# Patient Record
Sex: Male | Born: 1970
Health system: Southern US, Community
[De-identification: ages and names within clinical notes are randomized; demographics above are authoritative.]

## PROBLEM LIST (undated history)

## (undated) DIAGNOSIS — F329 Major depressive disorder, single episode, unspecified: Secondary | ICD-10-CM

## (undated) DIAGNOSIS — F32A Depression, unspecified: Secondary | ICD-10-CM

## (undated) DIAGNOSIS — S24109A Unspecified injury at unspecified level of thoracic spinal cord, initial encounter: Secondary | ICD-10-CM

## (undated) HISTORY — DX: Unspecified injury at unspecified level of thoracic spinal cord, initial encounter: S24.109A

---

## 2012-04-24 HISTORY — PX: PROGRAMABLE BACLOFEN PUMP REVISION: SHX2268

## 2014-04-27 DIAGNOSIS — T85618A Breakdown (mechanical) of other specified internal prosthetic devices, implants and grafts, initial encounter: Secondary | ICD-10-CM | POA: Diagnosis not present

## 2014-04-27 DIAGNOSIS — R252 Cramp and spasm: Secondary | ICD-10-CM | POA: Diagnosis not present

## 2014-04-27 DIAGNOSIS — E782 Mixed hyperlipidemia: Secondary | ICD-10-CM | POA: Diagnosis not present

## 2014-04-27 DIAGNOSIS — E785 Hyperlipidemia, unspecified: Secondary | ICD-10-CM | POA: Diagnosis not present

## 2014-04-27 DIAGNOSIS — T8589XA Other specified complication of internal prosthetic devices, implants and grafts, not elsewhere classified, initial encounter: Secondary | ICD-10-CM | POA: Diagnosis not present

## 2014-04-27 DIAGNOSIS — S24109D Unspecified injury at unspecified level of thoracic spinal cord, subsequent encounter: Secondary | ICD-10-CM | POA: Diagnosis not present

## 2014-04-27 DIAGNOSIS — S24103D Unspecified injury at T7-T10 level of thoracic spinal cord, subsequent encounter: Secondary | ICD-10-CM | POA: Diagnosis not present

## 2014-04-28 DIAGNOSIS — R252 Cramp and spasm: Secondary | ICD-10-CM | POA: Diagnosis not present

## 2014-05-04 DIAGNOSIS — G971 Other reaction to spinal and lumbar puncture: Secondary | ICD-10-CM | POA: Diagnosis not present

## 2014-05-04 DIAGNOSIS — R252 Cramp and spasm: Secondary | ICD-10-CM | POA: Diagnosis not present

## 2014-06-17 DIAGNOSIS — G8220 Paraplegia, unspecified: Secondary | ICD-10-CM | POA: Diagnosis not present

## 2014-06-17 DIAGNOSIS — M419 Scoliosis, unspecified: Secondary | ICD-10-CM | POA: Diagnosis not present

## 2014-06-17 DIAGNOSIS — Q899 Congenital malformation, unspecified: Secondary | ICD-10-CM | POA: Diagnosis not present

## 2014-07-08 DIAGNOSIS — S91101A Unspecified open wound of right great toe without damage to nail, initial encounter: Secondary | ICD-10-CM | POA: Diagnosis not present

## 2014-07-08 DIAGNOSIS — M79674 Pain in right toe(s): Secondary | ICD-10-CM | POA: Diagnosis not present

## 2015-01-12 DIAGNOSIS — G8220 Paraplegia, unspecified: Secondary | ICD-10-CM | POA: Diagnosis not present

## 2015-01-12 DIAGNOSIS — R52 Pain, unspecified: Secondary | ICD-10-CM | POA: Diagnosis not present

## 2015-01-12 DIAGNOSIS — M419 Scoliosis, unspecified: Secondary | ICD-10-CM | POA: Diagnosis not present

## 2015-01-12 DIAGNOSIS — G5601 Carpal tunnel syndrome, right upper limb: Secondary | ICD-10-CM | POA: Diagnosis not present

## 2015-01-12 DIAGNOSIS — L89159 Pressure ulcer of sacral region, unspecified stage: Secondary | ICD-10-CM | POA: Diagnosis not present

## 2015-01-12 DIAGNOSIS — K592 Neurogenic bowel, not elsewhere classified: Secondary | ICD-10-CM | POA: Diagnosis not present

## 2015-01-12 DIAGNOSIS — N319 Neuromuscular dysfunction of bladder, unspecified: Secondary | ICD-10-CM | POA: Diagnosis not present

## 2015-01-12 DIAGNOSIS — R252 Cramp and spasm: Secondary | ICD-10-CM | POA: Diagnosis not present

## 2015-01-12 DIAGNOSIS — S24109S Unspecified injury at unspecified level of thoracic spinal cord, sequela: Secondary | ICD-10-CM | POA: Diagnosis not present

## 2015-01-12 DIAGNOSIS — K3184 Gastroparesis: Secondary | ICD-10-CM | POA: Diagnosis not present

## 2015-01-13 DIAGNOSIS — Z0189 Encounter for other specified special examinations: Secondary | ICD-10-CM | POA: Diagnosis not present

## 2015-01-13 DIAGNOSIS — N390 Urinary tract infection, site not specified: Secondary | ICD-10-CM | POA: Diagnosis not present

## 2015-01-13 DIAGNOSIS — N200 Calculus of kidney: Secondary | ICD-10-CM | POA: Diagnosis not present

## 2015-01-13 DIAGNOSIS — K592 Neurogenic bowel, not elsewhere classified: Secondary | ICD-10-CM | POA: Diagnosis not present

## 2015-01-13 DIAGNOSIS — N319 Neuromuscular dysfunction of bladder, unspecified: Secondary | ICD-10-CM | POA: Diagnosis not present

## 2015-01-26 DIAGNOSIS — L720 Epidermal cyst: Secondary | ICD-10-CM | POA: Diagnosis not present

## 2015-01-26 DIAGNOSIS — F319 Bipolar disorder, unspecified: Secondary | ICD-10-CM | POA: Diagnosis not present

## 2015-01-26 DIAGNOSIS — L89154 Pressure ulcer of sacral region, stage 4: Secondary | ICD-10-CM | POA: Diagnosis not present

## 2015-01-26 DIAGNOSIS — L03312 Cellulitis of back [any part except buttock]: Secondary | ICD-10-CM | POA: Diagnosis not present

## 2015-01-26 DIAGNOSIS — G8220 Paraplegia, unspecified: Secondary | ICD-10-CM | POA: Diagnosis not present

## 2015-01-26 DIAGNOSIS — G839 Paralytic syndrome, unspecified: Secondary | ICD-10-CM | POA: Diagnosis not present

## 2015-02-01 DIAGNOSIS — L89110 Pressure ulcer of right upper back, unstageable: Secondary | ICD-10-CM | POA: Diagnosis not present

## 2015-02-01 DIAGNOSIS — L89150 Pressure ulcer of sacral region, unstageable: Secondary | ICD-10-CM | POA: Diagnosis not present

## 2015-02-02 DIAGNOSIS — G8220 Paraplegia, unspecified: Secondary | ICD-10-CM | POA: Diagnosis not present

## 2015-02-02 DIAGNOSIS — L89154 Pressure ulcer of sacral region, stage 4: Secondary | ICD-10-CM | POA: Diagnosis not present

## 2015-02-02 DIAGNOSIS — L720 Epidermal cyst: Secondary | ICD-10-CM | POA: Diagnosis not present

## 2015-02-02 DIAGNOSIS — L03312 Cellulitis of back [any part except buttock]: Secondary | ICD-10-CM | POA: Diagnosis not present

## 2015-02-02 DIAGNOSIS — F319 Bipolar disorder, unspecified: Secondary | ICD-10-CM | POA: Diagnosis not present

## 2015-02-04 DIAGNOSIS — L89150 Pressure ulcer of sacral region, unstageable: Secondary | ICD-10-CM | POA: Diagnosis not present

## 2015-02-04 DIAGNOSIS — L89110 Pressure ulcer of right upper back, unstageable: Secondary | ICD-10-CM | POA: Diagnosis not present

## 2015-02-06 DIAGNOSIS — L89150 Pressure ulcer of sacral region, unstageable: Secondary | ICD-10-CM | POA: Diagnosis not present

## 2015-02-06 DIAGNOSIS — L89110 Pressure ulcer of right upper back, unstageable: Secondary | ICD-10-CM | POA: Diagnosis not present

## 2015-02-09 DIAGNOSIS — L89150 Pressure ulcer of sacral region, unstageable: Secondary | ICD-10-CM | POA: Diagnosis not present

## 2015-02-09 DIAGNOSIS — L89110 Pressure ulcer of right upper back, unstageable: Secondary | ICD-10-CM | POA: Diagnosis not present

## 2015-02-11 DIAGNOSIS — L89154 Pressure ulcer of sacral region, stage 4: Secondary | ICD-10-CM | POA: Diagnosis not present

## 2015-02-11 DIAGNOSIS — L89323 Pressure ulcer of left buttock, stage 3: Secondary | ICD-10-CM | POA: Diagnosis not present

## 2015-02-11 DIAGNOSIS — L720 Epidermal cyst: Secondary | ICD-10-CM | POA: Diagnosis not present

## 2015-02-11 DIAGNOSIS — F319 Bipolar disorder, unspecified: Secondary | ICD-10-CM | POA: Diagnosis not present

## 2015-02-11 DIAGNOSIS — L03312 Cellulitis of back [any part except buttock]: Secondary | ICD-10-CM | POA: Diagnosis not present

## 2015-02-11 DIAGNOSIS — G8220 Paraplegia, unspecified: Secondary | ICD-10-CM | POA: Diagnosis not present

## 2015-02-13 DIAGNOSIS — L89150 Pressure ulcer of sacral region, unstageable: Secondary | ICD-10-CM | POA: Diagnosis not present

## 2015-02-13 DIAGNOSIS — L89110 Pressure ulcer of right upper back, unstageable: Secondary | ICD-10-CM | POA: Diagnosis not present

## 2015-02-15 DIAGNOSIS — L89150 Pressure ulcer of sacral region, unstageable: Secondary | ICD-10-CM | POA: Diagnosis not present

## 2015-02-15 DIAGNOSIS — L89110 Pressure ulcer of right upper back, unstageable: Secondary | ICD-10-CM | POA: Diagnosis not present

## 2015-02-17 DIAGNOSIS — L89150 Pressure ulcer of sacral region, unstageable: Secondary | ICD-10-CM | POA: Diagnosis not present

## 2015-02-17 DIAGNOSIS — L89110 Pressure ulcer of right upper back, unstageable: Secondary | ICD-10-CM | POA: Diagnosis not present

## 2015-02-24 DIAGNOSIS — L89150 Pressure ulcer of sacral region, unstageable: Secondary | ICD-10-CM | POA: Diagnosis not present

## 2015-02-24 DIAGNOSIS — L89110 Pressure ulcer of right upper back, unstageable: Secondary | ICD-10-CM | POA: Diagnosis not present

## 2015-02-25 DIAGNOSIS — L89154 Pressure ulcer of sacral region, stage 4: Secondary | ICD-10-CM | POA: Diagnosis not present

## 2015-02-25 DIAGNOSIS — G8220 Paraplegia, unspecified: Secondary | ICD-10-CM | POA: Diagnosis not present

## 2015-03-02 DIAGNOSIS — L89110 Pressure ulcer of right upper back, unstageable: Secondary | ICD-10-CM | POA: Diagnosis not present

## 2015-03-02 DIAGNOSIS — L89150 Pressure ulcer of sacral region, unstageable: Secondary | ICD-10-CM | POA: Diagnosis not present

## 2015-03-07 DIAGNOSIS — G8220 Paraplegia, unspecified: Secondary | ICD-10-CM | POA: Diagnosis not present

## 2015-03-07 DIAGNOSIS — F17200 Nicotine dependence, unspecified, uncomplicated: Secondary | ICD-10-CM | POA: Diagnosis not present

## 2015-03-07 DIAGNOSIS — Z781 Physical restraint status: Secondary | ICD-10-CM | POA: Diagnosis not present

## 2015-03-07 DIAGNOSIS — L89154 Pressure ulcer of sacral region, stage 4: Secondary | ICD-10-CM | POA: Diagnosis not present

## 2015-03-07 DIAGNOSIS — F3130 Bipolar disorder, current episode depressed, mild or moderate severity, unspecified: Secondary | ICD-10-CM | POA: Diagnosis not present

## 2015-03-07 DIAGNOSIS — F332 Major depressive disorder, recurrent severe without psychotic features: Secondary | ICD-10-CM | POA: Diagnosis not present

## 2015-03-07 DIAGNOSIS — Z993 Dependence on wheelchair: Secondary | ICD-10-CM | POA: Diagnosis not present

## 2015-03-07 DIAGNOSIS — R45851 Suicidal ideations: Secondary | ICD-10-CM | POA: Diagnosis not present

## 2015-03-08 DIAGNOSIS — Z0189 Encounter for other specified special examinations: Secondary | ICD-10-CM | POA: Diagnosis not present

## 2015-03-08 DIAGNOSIS — G8220 Paraplegia, unspecified: Secondary | ICD-10-CM | POA: Diagnosis not present

## 2015-03-08 DIAGNOSIS — N319 Neuromuscular dysfunction of bladder, unspecified: Secondary | ICD-10-CM | POA: Diagnosis not present

## 2015-03-08 DIAGNOSIS — R45851 Suicidal ideations: Secondary | ICD-10-CM | POA: Diagnosis not present

## 2015-03-08 DIAGNOSIS — L89154 Pressure ulcer of sacral region, stage 4: Secondary | ICD-10-CM | POA: Diagnosis not present

## 2015-03-08 DIAGNOSIS — F3130 Bipolar disorder, current episode depressed, mild or moderate severity, unspecified: Secondary | ICD-10-CM | POA: Diagnosis not present

## 2015-03-08 DIAGNOSIS — F319 Bipolar disorder, unspecified: Secondary | ICD-10-CM | POA: Diagnosis not present

## 2015-03-08 DIAGNOSIS — M62838 Other muscle spasm: Secondary | ICD-10-CM | POA: Diagnosis not present

## 2015-03-08 DIAGNOSIS — F17200 Nicotine dependence, unspecified, uncomplicated: Secondary | ICD-10-CM | POA: Diagnosis not present

## 2015-03-09 DIAGNOSIS — F319 Bipolar disorder, unspecified: Secondary | ICD-10-CM | POA: Diagnosis not present

## 2015-03-09 DIAGNOSIS — R45851 Suicidal ideations: Secondary | ICD-10-CM | POA: Diagnosis not present

## 2015-03-09 DIAGNOSIS — F3130 Bipolar disorder, current episode depressed, mild or moderate severity, unspecified: Secondary | ICD-10-CM | POA: Diagnosis not present

## 2015-03-10 DIAGNOSIS — F319 Bipolar disorder, unspecified: Secondary | ICD-10-CM | POA: Diagnosis not present

## 2015-03-11 DIAGNOSIS — F319 Bipolar disorder, unspecified: Secondary | ICD-10-CM | POA: Diagnosis not present

## 2015-03-12 DIAGNOSIS — L89110 Pressure ulcer of right upper back, unstageable: Secondary | ICD-10-CM | POA: Diagnosis not present

## 2015-03-12 DIAGNOSIS — L89150 Pressure ulcer of sacral region, unstageable: Secondary | ICD-10-CM | POA: Diagnosis not present

## 2015-03-13 DIAGNOSIS — L89150 Pressure ulcer of sacral region, unstageable: Secondary | ICD-10-CM | POA: Diagnosis not present

## 2015-03-13 DIAGNOSIS — L89110 Pressure ulcer of right upper back, unstageable: Secondary | ICD-10-CM | POA: Diagnosis not present

## 2015-03-15 DIAGNOSIS — L89110 Pressure ulcer of right upper back, unstageable: Secondary | ICD-10-CM | POA: Diagnosis not present

## 2015-03-15 DIAGNOSIS — L89150 Pressure ulcer of sacral region, unstageable: Secondary | ICD-10-CM | POA: Diagnosis not present

## 2015-03-16 DIAGNOSIS — L89110 Pressure ulcer of right upper back, unstageable: Secondary | ICD-10-CM | POA: Diagnosis not present

## 2015-03-16 DIAGNOSIS — L89154 Pressure ulcer of sacral region, stage 4: Secondary | ICD-10-CM | POA: Diagnosis not present

## 2015-03-16 DIAGNOSIS — G8220 Paraplegia, unspecified: Secondary | ICD-10-CM | POA: Diagnosis not present

## 2015-03-16 DIAGNOSIS — L89150 Pressure ulcer of sacral region, unstageable: Secondary | ICD-10-CM | POA: Diagnosis not present

## 2015-03-19 DIAGNOSIS — L89150 Pressure ulcer of sacral region, unstageable: Secondary | ICD-10-CM | POA: Diagnosis not present

## 2015-03-19 DIAGNOSIS — L89110 Pressure ulcer of right upper back, unstageable: Secondary | ICD-10-CM | POA: Diagnosis not present

## 2015-03-22 DIAGNOSIS — L89150 Pressure ulcer of sacral region, unstageable: Secondary | ICD-10-CM | POA: Diagnosis not present

## 2015-03-22 DIAGNOSIS — L89110 Pressure ulcer of right upper back, unstageable: Secondary | ICD-10-CM | POA: Diagnosis not present

## 2015-03-24 DIAGNOSIS — L89150 Pressure ulcer of sacral region, unstageable: Secondary | ICD-10-CM | POA: Diagnosis not present

## 2015-03-24 DIAGNOSIS — L89110 Pressure ulcer of right upper back, unstageable: Secondary | ICD-10-CM | POA: Diagnosis not present

## 2015-03-26 DIAGNOSIS — L89150 Pressure ulcer of sacral region, unstageable: Secondary | ICD-10-CM | POA: Diagnosis not present

## 2015-03-26 DIAGNOSIS — L89110 Pressure ulcer of right upper back, unstageable: Secondary | ICD-10-CM | POA: Diagnosis not present

## 2015-03-29 DIAGNOSIS — L89110 Pressure ulcer of right upper back, unstageable: Secondary | ICD-10-CM | POA: Diagnosis not present

## 2015-03-29 DIAGNOSIS — L89150 Pressure ulcer of sacral region, unstageable: Secondary | ICD-10-CM | POA: Diagnosis not present

## 2015-03-30 DIAGNOSIS — L89150 Pressure ulcer of sacral region, unstageable: Secondary | ICD-10-CM | POA: Diagnosis not present

## 2015-03-30 DIAGNOSIS — L89110 Pressure ulcer of right upper back, unstageable: Secondary | ICD-10-CM | POA: Diagnosis not present

## 2015-03-31 DIAGNOSIS — L89150 Pressure ulcer of sacral region, unstageable: Secondary | ICD-10-CM | POA: Diagnosis not present

## 2015-03-31 DIAGNOSIS — L89110 Pressure ulcer of right upper back, unstageable: Secondary | ICD-10-CM | POA: Diagnosis not present

## 2015-04-02 DIAGNOSIS — F333 Major depressive disorder, recurrent, severe with psychotic symptoms: Secondary | ICD-10-CM | POA: Diagnosis not present

## 2015-04-02 DIAGNOSIS — L89150 Pressure ulcer of sacral region, unstageable: Secondary | ICD-10-CM | POA: Diagnosis not present

## 2015-04-07 DIAGNOSIS — L89150 Pressure ulcer of sacral region, unstageable: Secondary | ICD-10-CM | POA: Diagnosis not present

## 2015-04-07 DIAGNOSIS — F333 Major depressive disorder, recurrent, severe with psychotic symptoms: Secondary | ICD-10-CM | POA: Diagnosis not present

## 2015-04-09 DIAGNOSIS — L89150 Pressure ulcer of sacral region, unstageable: Secondary | ICD-10-CM | POA: Diagnosis not present

## 2015-04-09 DIAGNOSIS — F333 Major depressive disorder, recurrent, severe with psychotic symptoms: Secondary | ICD-10-CM | POA: Diagnosis not present

## 2015-04-13 DIAGNOSIS — F3189 Other bipolar disorder: Secondary | ICD-10-CM | POA: Diagnosis not present

## 2015-04-13 DIAGNOSIS — G8220 Paraplegia, unspecified: Secondary | ICD-10-CM | POA: Diagnosis not present

## 2015-04-13 DIAGNOSIS — L89154 Pressure ulcer of sacral region, stage 4: Secondary | ICD-10-CM | POA: Diagnosis not present

## 2015-04-28 DIAGNOSIS — F333 Major depressive disorder, recurrent, severe with psychotic symptoms: Secondary | ICD-10-CM | POA: Diagnosis not present

## 2015-04-28 DIAGNOSIS — L89150 Pressure ulcer of sacral region, unstageable: Secondary | ICD-10-CM | POA: Diagnosis not present

## 2015-04-29 DIAGNOSIS — G8220 Paraplegia, unspecified: Secondary | ICD-10-CM | POA: Diagnosis not present

## 2015-04-29 DIAGNOSIS — L89154 Pressure ulcer of sacral region, stage 4: Secondary | ICD-10-CM | POA: Diagnosis not present

## 2015-05-05 DIAGNOSIS — L89150 Pressure ulcer of sacral region, unstageable: Secondary | ICD-10-CM | POA: Diagnosis not present

## 2015-05-05 DIAGNOSIS — F333 Major depressive disorder, recurrent, severe with psychotic symptoms: Secondary | ICD-10-CM | POA: Diagnosis not present

## 2015-05-10 DIAGNOSIS — L89154 Pressure ulcer of sacral region, stage 4: Secondary | ICD-10-CM | POA: Diagnosis not present

## 2015-05-10 DIAGNOSIS — G8220 Paraplegia, unspecified: Secondary | ICD-10-CM | POA: Diagnosis not present

## 2015-05-12 DIAGNOSIS — F333 Major depressive disorder, recurrent, severe with psychotic symptoms: Secondary | ICD-10-CM | POA: Diagnosis not present

## 2015-05-12 DIAGNOSIS — L89150 Pressure ulcer of sacral region, unstageable: Secondary | ICD-10-CM | POA: Diagnosis not present

## 2015-05-19 DIAGNOSIS — F333 Major depressive disorder, recurrent, severe with psychotic symptoms: Secondary | ICD-10-CM | POA: Diagnosis not present

## 2015-05-19 DIAGNOSIS — L89150 Pressure ulcer of sacral region, unstageable: Secondary | ICD-10-CM | POA: Diagnosis not present

## 2015-05-24 DIAGNOSIS — L89323 Pressure ulcer of left buttock, stage 3: Secondary | ICD-10-CM | POA: Diagnosis not present

## 2015-05-24 DIAGNOSIS — L89154 Pressure ulcer of sacral region, stage 4: Secondary | ICD-10-CM | POA: Diagnosis not present

## 2015-05-24 DIAGNOSIS — S24103D Unspecified injury at T7-T10 level of thoracic spinal cord, subsequent encounter: Secondary | ICD-10-CM | POA: Diagnosis not present

## 2015-05-24 DIAGNOSIS — G8220 Paraplegia, unspecified: Secondary | ICD-10-CM | POA: Diagnosis not present

## 2015-06-08 DIAGNOSIS — L720 Epidermal cyst: Secondary | ICD-10-CM | POA: Diagnosis not present

## 2015-06-08 DIAGNOSIS — L89154 Pressure ulcer of sacral region, stage 4: Secondary | ICD-10-CM | POA: Diagnosis not present

## 2015-06-08 DIAGNOSIS — G839 Paralytic syndrome, unspecified: Secondary | ICD-10-CM | POA: Diagnosis not present

## 2015-06-08 DIAGNOSIS — G8220 Paraplegia, unspecified: Secondary | ICD-10-CM | POA: Diagnosis not present

## 2015-06-08 DIAGNOSIS — S24103D Unspecified injury at T7-T10 level of thoracic spinal cord, subsequent encounter: Secondary | ICD-10-CM | POA: Diagnosis not present

## 2015-06-08 DIAGNOSIS — F319 Bipolar disorder, unspecified: Secondary | ICD-10-CM | POA: Diagnosis not present

## 2015-06-22 DIAGNOSIS — G8220 Paraplegia, unspecified: Secondary | ICD-10-CM | POA: Diagnosis not present

## 2015-06-22 DIAGNOSIS — L89154 Pressure ulcer of sacral region, stage 4: Secondary | ICD-10-CM | POA: Diagnosis not present

## 2015-06-22 DIAGNOSIS — L720 Epidermal cyst: Secondary | ICD-10-CM | POA: Diagnosis not present

## 2015-06-22 DIAGNOSIS — G839 Paralytic syndrome, unspecified: Secondary | ICD-10-CM | POA: Diagnosis not present

## 2015-06-22 DIAGNOSIS — F319 Bipolar disorder, unspecified: Secondary | ICD-10-CM | POA: Diagnosis not present

## 2015-07-20 DIAGNOSIS — L89154 Pressure ulcer of sacral region, stage 4: Secondary | ICD-10-CM | POA: Diagnosis not present

## 2015-07-20 DIAGNOSIS — G8220 Paraplegia, unspecified: Secondary | ICD-10-CM | POA: Diagnosis not present

## 2015-07-20 DIAGNOSIS — F319 Bipolar disorder, unspecified: Secondary | ICD-10-CM | POA: Diagnosis not present

## 2015-07-20 DIAGNOSIS — G839 Paralytic syndrome, unspecified: Secondary | ICD-10-CM | POA: Diagnosis not present

## 2015-07-20 DIAGNOSIS — L720 Epidermal cyst: Secondary | ICD-10-CM | POA: Diagnosis not present

## 2015-08-04 DIAGNOSIS — T148 Other injury of unspecified body region: Secondary | ICD-10-CM | POA: Diagnosis not present

## 2015-08-04 DIAGNOSIS — L89159 Pressure ulcer of sacral region, unspecified stage: Secondary | ICD-10-CM | POA: Diagnosis not present

## 2015-08-17 ENCOUNTER — Encounter: Payer: Medicare Other | Attending: Internal Medicine | Admitting: Internal Medicine

## 2015-08-17 DIAGNOSIS — G8222 Paraplegia, incomplete: Secondary | ICD-10-CM | POA: Diagnosis not present

## 2015-08-17 DIAGNOSIS — Z8249 Family history of ischemic heart disease and other diseases of the circulatory system: Secondary | ICD-10-CM | POA: Insufficient documentation

## 2015-08-17 DIAGNOSIS — F17210 Nicotine dependence, cigarettes, uncomplicated: Secondary | ICD-10-CM | POA: Insufficient documentation

## 2015-08-17 DIAGNOSIS — L89153 Pressure ulcer of sacral region, stage 3: Secondary | ICD-10-CM | POA: Diagnosis not present

## 2015-08-18 NOTE — Progress Notes (Signed)
Matthew, Frank ([id number]) Visit Report for 08/17/2015 Abuse/Suicide Risk Screen Details Patient Name: Matthew, Frank Date of Service: 08/17/2015 9:30 AM Medical Record Patient Account Number: [id number] Number: Treating RN: Phillis Haggis June 22, 1970 (45 y.o. Other Clinician: Date of Birth/Sex: Male) Treating ROBSON, MICHAEL Primary Care Physician: Physician/Extender: G Referring Physician: Weeks in Treatment: 0 Abuse/Suicide Risk Screen Items Answer ABUSE/SUICIDE RISK SCREEN: Has anyone close to you tried to hurt or harm you recentlyo No Do you feel uncomfortable with anyone in your familyo No Has anyone forced you do things that you didnot want to doo No Do you have any thoughts of harming yourselfo No Patient displays signs or symptoms of abuse and/or neglect. No Electronic Signature(s) Signed: 08/17/2015 5:04:44 PM By: Alejandro Mulling Entered By: Alejandro Mulling on 08/17/2015 10:26:09 [name], [name] ([id number]) -------------------------------------------------------------------------------- Activities of Daily Living Details Patient Name: [name], [name] Date of Service: 08/17/2015 9:30 AM Medical Record Patient Account Number: [id number] Number: Treating RN: Phillis Haggis Nov 15, 1970 (45 y.o. Other Clinician: Date of Birth/Sex: Male) Treating ROBSON, MICHAEL Primary Care Physician: Physician/Extender: G Referring Physician: Weeks in Treatment: 0 Activities of Daily Living Items Answer Activities of Daily Living (Please select one for each item) Drive Automobile Completely Able Take Medications Completely Able Use Telephone Completely Able Care for Appearance Completely Able Use Toilet Completely Able Bath / Shower Completely Able Dress Self Completely Able Feed Self Completely Able Walk Completely Able Get In / Out Bed Completely Able Housework Completely Able Prepare Meals Completely Able Handle Money Completely Able Shop for Self  Completely Able Electronic Signature(s) Signed: 08/17/2015 5:04:44 PM By: Alejandro Mulling Entered By: Alejandro Mulling on 08/17/2015 10:26:34 [name], [name] ([id number]) -------------------------------------------------------------------------------- Education Assessment Details Patient Name: [name] Date of Service: 08/17/2015 9:30 AM Medical Record Patient Account Number: [id number] Number: Treating RN: Phillis Haggis Jun 20, 1970 (45 y.o. Other Clinician: Date of Birth/Sex: Male) Treating ROBSON, MICHAEL Primary Care Physician: Physician/Extender: G Referring Physician: Tania Ade in Treatment: 0 Primary Learner Assessed: Patient Learning Preferences/Education Level/Primary Language Learning Preference: Explanation, Printed Material Highest Education Level: College or Above Preferred Language: English Cognitive Barrier Assessment/Beliefs Language Barrier: No Translator Needed: No Memory Deficit: No Emotional Barrier: No Cultural/Religious Beliefs Affecting Medical No Care: Physical Barrier Assessment Impaired Vision: No Impaired Hearing: No Decreased Hand dexterity: No Knowledge/Comprehension Assessment Knowledge Level: High Comprehension Level: High Ability to understand written High instructions: Ability to understand verbal High instructions: Motivation Assessment Anxiety Level: Calm Cooperation: Cooperative Education Importance: Acknowledges Need Interest in Health Problems: Asks Questions Perception: Coherent Willingness to Engage in Self- High Management Activities: Readiness to Engage in Self- High Management Activities: [name], [name] ([id number]) Electronic Signature(s) Signed: 08/17/2015 5:04:44 PM By: Alejandro Mulling Entered By: Alejandro Mulling on 08/17/2015 10:26:57 [name], [name] ([id number]) -------------------------------------------------------------------------------- Fall Risk Assessment Details Patient Name: [name] Date of Service: 08/17/2015 9:30 AM Medical Record Patient Account Number: [id number] Number: Treating RN: Phillis Haggis 15-Jan-1971 (45 y.o. Other Clinician: Date of Birth/Sex: Male) Treating ROBSON, MICHAEL Primary Care Physician: Physician/Extender: G Referring Physician: Weeks in Treatment: 0 Fall Risk Assessment Items Have you had 2 or more falls in the last 12 monthso 0 Yes Have you had any fall that resulted in injury in the last 12 monthso 0 No FALL RISK ASSESSMENT: History of falling - immediate or within 3 months 0 No Secondary diagnosis 15 Yes Ambulatory aid None/bed rest/wheelchair/nurse 0 Yes Crutches/cane/walker 0 No Furniture 0 No IV Access/Saline Lock 0 No Gait/Training Normal/bed rest/immobile 0 No Weak 10 Yes Impaired 20 Yes Mental Status  Oriented to own ability 0 Yes Electronic Signature(s) Signed: 08/17/2015 5:04:44 PM By: Alejandro MullingPinkerton, Debra Entered By: Alejandro MullingPinkerton, Debra on 08/17/2015 10:27:28 [name], [name] ([id number]) -------------------------------------------------------------------------------- Nutrition Risk Assessment Details Patient Name: [name], [name] Date of Service: 08/17/2015 9:30 AM Medical Record Patient Account Number: [id number] Number: Treating RN: Phillis Haggisinkerton, Debi [birth year] (45 y.o. Other Clinician: Date of Birth/Sex: Male) Treating ROBSON, MICHAEL Primary Care Physician: Physician/Extender: G Referring Physician: Weeks in Treatment: 0 Height (in): 67 Weight (lbs): 147 Body Mass Index (BMI): 23 Nutrition Risk Assessment Items NUTRITION RISK SCREEN: I have an illness or condition that made me change the kind and/or 2 Yes amount of food I eat I eat fewer than two meals per day 0 No I eat few fruits and vegetables, or milk products 0 No I have three or more drinks of beer, liquor or wine almost every day 0 No I have tooth or mouth problems that make it hard for me to eat 0 No I don't always have  enough money to buy the food I need 0 No I eat alone most of the time 0 No I take three or more different prescribed or over-the-counter drugs a 0 No day Without wanting to, I have lost or gained 10 pounds in the last six 0 No months I am not always physically able to shop, cook and/or feed myself 0 No Nutrition Protocols Good Risk Protocol Moderate Risk Protocol Electronic Signature(s) Signed: 08/17/2015 5:04:44 PM By: Alejandro MullingPinkerton, Debra Entered By: Alejandro MullingPinkerton, Debra on 08/17/2015 10:28:02

## 2015-08-19 NOTE — Progress Notes (Signed)
[name], WISE ([id number]) Visit Report for 08/17/2015 Chief Complaint Document Details Patient Name: [name], [name] Date of Service: 08/17/2015 9:30 AM Medical Record Patient Account Number: [id number] Number: Treating RN: Phillis Haggis 1971/04/14 (44 y.o. Other Clinician: Date of Birth/Sex: Male) Treating Tanmay Halteman Primary Care Physician: Physician/Extender: G Referring Physician: Weeks in Treatment: 0 Information Obtained from: Patient Chief Complaint 08/17/15; the patient is recently really relocated from [facility]. He is here to establish a wound care for a stage III ulcer on his lower sacral area. Electronic Signature(s) Signed: 08/19/2015 7:59:30 AM By: Baltazar Najjar MD Entered By: Baltazar Najjar on 08/17/2015 12:57:39 [name] ([id number]) -------------------------------------------------------------------------------- Debridement Details Patient Name: [name] Date of Service: 08/17/2015 9:30 AM Medical Record Patient Account Number: [id number] Number: Treating RN: Phillis Haggis 27-Sep-1970 (44 y.o. Other Clinician: Date of Birth/Sex: Male) Treating Gertrude Tarbet Primary Care Physician: Physician/Extender: G Referring Physician: Weeks in Treatment: 0 Debridement Performed for Wound #1 Left Coccyx Assessment: Performed By: Physician Maxwell Caul, MD Debridement: Debridement Pre-procedure Yes Verification/Time Out Taken: Start Time: 10:49 Pain Control: Lidocaine 4% Topical Solution Level: Skin/Subcutaneous Tissue Total Area Debrided (L x 0.9 (cm) x 1.5 (cm) = 1.35 (cm) W): Tissue and other Viable, Non-Viable, Exudate, Fibrin/Slough, Subcutaneous material debrided: Instrument: Curette Bleeding: Minimum Hemostasis Achieved: Pressure End Time: 10:53 Procedural Pain: 0 Post Procedural Pain: 0 Response to Treatment: Procedure was tolerated well Post Debridement Measurements of Total  Wound Length: (cm) 0.3 Stage: Category/Stage III Width: (cm) 1.5 Depth: (cm) 0.5 Volume: (cm) 0.177 Post Procedure Diagnosis Same as Pre-procedure Electronic Signature(s) Signed: 08/17/2015 5:04:44 PM By: [name] Mulling Signed: 08/19/2015 7:59:30 AM By: Baltazar Najjar MD Entered By: Baltazar Najjar on 08/17/2015 12:56:39 [name] ([id number]) -------------------------------------------------------------------------------- HPI Details Patient Name: [name] Date of Service: 08/17/2015 9:30 AM Medical Record Patient Account Number: [id number] Number: Treating RN: Phillis Haggis 08-Feb-1971 (44 y.o. 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 He was diagnosed with what sounds like a stage III pressure ulcer in September 2016 although this may have been present for some time longer than that perhaps July 2016. He was followed by wound care in [place]. By his description there were using Silvadene cream for a period of time and then 3 months ago change to Aquacel Ag which she is continued and changes daily with help from his mother. He has recently relocated to [facility]. He went to urgent care to follow-up on his wound on 08/04/2015 they did a culture of this wound that showed Pseudomonas and MSSA. He was placed on an antibiotic but doesn't remember which one it is. He has continued using Aquacel Ag and a foam based cover. I don't have any of his records from the wound care center in [place] although the patient states he did have an MRI that did not show osteomyelitis. He is not aware of having a bone culture done and doesn't think that he had exposed bone  on this and any point. The patient does in and out cath's and has no urinary incontinence issues. He is on appropriate bowel regimen which she administers suppositories. He is able to position himself appropriately and is wheelchair to try and avoid pressure over this wound Electronic Signature(s) Signed: 08/19/2015 7:59:30 AM  By: Baltazar Najjar MD Entered By: Baltazar Najjar on 08/17/2015 13:03:09 [name] ([id number]) -------------------------------------------------------------------------------- Physical Exam Details Patient Name: [name] Date of Service: 08/17/2015 9:30 AM Medical Record Patient Account Number: [id number] Number: Treating RN: Phillis Haggis 1970/12/25 (45 y.o. Other Clinician: Date of Birth/Sex: Male) Treating Kingslee Mairena Primary Care Physician: Physician/Extender: G Referring Physician: Weeks in Treatment: 0 Constitutional Sitting or standing Blood Pressure is within target range for patient.. Pulse regular and within target range for patient.Marland Kitchen Respirations regular, non-labored and within target range.. Temperature is normal and within the target range for the patient.. Patient's appearance is neat and clean. Appears in no acute distress. Well nourished and well developed.Marland Kitchen Respiratory Respiratory effort is easy and symmetric bilaterally. Rate is normal at rest and on room air.. Bilateral breath sounds are clear and equal in all lobes with no wheezes, rales or rhonchi.. Cardiovascular Heart rhythm and rate regular, without murmur or gallop.. Gastrointestinal (GI) Abdomen is soft and non-distended without masses or tenderness. Bowel sounds active in all quadrants.. No liver or spleen enlargement or tenderness.. Integumentary (Hair, Skin) Patient has a small stage III pressure ulcer at the lower aspect of his sacrum. Psychiatric No evidence of depression, anxiety, or agitation. Calm, cooperative, and communicative.  Appropriate interactions and affect.. Notes Wound exam; the wound itself is at the lower end of his sacrum. Small horizontal wound but with some depth. This was surrounded with some macerated skin which I removed some nonviable subcutaneous tissue on the circumference of the wound and also at the base of the wound. This was also removed. I did not see anything here that really required culturing there was no evidence of a subcutaneous infection around the wound. Electronic Signature(s) Signed: 08/19/2015 7:59:30 AM By: Baltazar Najjar MD Entered By: Baltazar Najjar on 08/17/2015 13:03:23 [name], [name] ([id number]) -------------------------------------------------------------------------------- Physician Orders Details Patient Name: [name] Date of Service: 08/17/2015 9:30 AM Medical Record Patient Account Number: [id number] Number: Treating RN: Phillis Haggis 11-Sep-1970 (44 y.o. Other Clinician: Date of Birth/Sex: Male) Treating Annalisa Colonna Primary Care Physician: Physician/Extender: G Referring Physician: Weeks in Treatment: 0 Verbal / Phone Orders: Yes ClinicianAshok Cordia, Debi Read Back and Verified: Yes Diagnosis Coding Wound Cleansing Wound #1 Left Coccyx o Clean wound with Normal Saline. Anesthetic Wound #1 Left Coccyx o Topical Lidocaine 4% cream applied to wound bed prior to debridement Skin Barriers/Peri-Wound Care Wound #1 Left Coccyx o Skin Prep Primary Wound Dressing o Prisma Ag - moisten with normal saline or ky jelly Secondary Dressing Wound #1 Left Coccyx o Dry Gauze o Boardered Foam Dressing Dressing Change Frequency Wound #1 Left Coccyx o Change dressing every other day. - unless there is a lot of drainage Follow-up Appointments Wound #1 Left Coccyx o Return Appointment in 1 week. Off-Loading Wound #1 Left Coccyx o Turn and reposition every 2 hours Additional Orders / Instructions [name], [name]  ([id number]) Wound #1 Left Coccyx o Increase protein intake. Electronic Signature(s) Signed: 08/17/2015 5:04:44 PM By: [name] Mulling Signed: 08/19/2015 7:59:30 AM By: Baltazar Najjar MD Entered By: [name] Mulling on 08/17/2015 10:58:14 [name], [name] ([id number]) -------------------------------------------------------------------------------- Problem List Details Patient Name: [name] Date of Service: 08/17/2015 9:30 AM Medical Record Patient Account Number: [id number] Number: Treating RN: Phillis Haggis [birth year] (44 y.o. Other Clinician: Date of Birth/Sex: Male) Treating Evora Schechter Primary Care Physician: Physician/Extender: G Referring Physician: Weeks in Treatment: 0 Active Problems ICD-10 Encounter Code Description Active Date Diagnosis L89.153 Pressure ulcer of sacral region, stage 3 08/17/2015 Yes G82.22  Paraplegia, incomplete 08/17/2015 Yes Inactive Problems Resolved Problems Electronic Signature(s) Signed: 08/19/2015 7:59:30 AM By: Baltazar Najjar MD Entered By: Baltazar Najjar on 08/17/2015 12:04:15 [name], [name] ([id number]) -------------------------------------------------------------------------------- Progress Note Details Patient Name: [name] Date of Service: 08/17/2015 9:30 AM Medical Record Patient Account Number: [id number] Number: Treating RN: Phillis Haggis 20-Nov-1970 (44 y.o. Other Clinician: Date of Birth/Sex: Male) Treating Devarious Pavek Primary Care Physician: Physician/Extender: G Referring Physician: Weeks in Treatment: 0 Subjective Chief Complaint Information obtained from Patient 08/17/15; the patient is recently really relocated from [facility]. He is here to establish a wound care for a stage III ulcer on his lower sacral area. 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 He was diagnosed with what sounds like a stage III pressure ulcer in September 2016 although this may have been present for some time longer than that perhaps July 2016. He was followed by wound care in [place]. By his description there were using Silvadene cream for a period of time and then 3 months ago change to Aquacel Ag which she is continued and changes daily with help from his mother. He has recently relocated to [address] [facility]. He went to urgent care to follow- up on his wound on 08/04/2015 they did a culture of this wound that showed Pseudomonas and MSSA. He was placed on an antibiotic but doesn't remember which one it is. He has continued using Aquacel Ag and a foam based cover. I don't have any of his records from the wound care center in [place] although the patient states he did have an MRI that did not show osteomyelitis. He is not aware of having a bone culture done and doesn't think that he had exposed bone on this and any point. The patient does in and out cath's and has no urinary incontinence issues. He is on appropriate bowel regimen which she administers suppositories. He is able to position himself appropriately and is wheelchair to try and avoid pressure over this wound Wound History Patient presents with 1 open wound that has been present for approximately Sept 2016. Patient has been treating wound in the following manner: washing, dakins, AqAg. Laboratory tests have not been performed in the last month. Patient reportedly has not tested positive for an antibiotic resistant organism. Patient reportedly has not tested positive for osteomyelitis. Patient reportedly has not had testing performed to evaluate circulation in the legs. Patient experiences the following problems associated with their wounds: swelling. Patient History Information obtained from Patient. Allergies No allergies have been documented for the patient [name], [name]  ([id number]) Family History Hypertension - Maternal Grandparents, No family history of Cancer, Diabetes, Heart Disease, Hereditary Spherocytosis, Kidney Disease, Lung Disease, Seizures, Stroke, Thyroid Problems, Tuberculosis. Social History Current every day smoker - 1 pack in 2 days, Marital Status - Divorced, Alcohol Use - Rarely, Drug Use - Current History - cannibis, Caffeine Use - Never. Medical History Neurologic Patient has history of Paraplegia Review of Systems (ROS) Constitutional Symptoms (General Health) The patient has no complaints or symptoms. Eyes The patient has no complaints or symptoms. Ear/Nose/Mouth/Throat The patient has no complaints or symptoms. Hematologic/Lymphatic The patient has no complaints or symptoms. Respiratory The patient has no complaints or symptoms. Cardiovascular The patient has no complaints or symptoms. Gastrointestinal The patient has no complaints or symptoms. Endocrine The patient has no complaints or symptoms. Genitourinary The patient  has no complaints or symptoms. Immunological The patient has no complaints or symptoms. Musculoskeletal Complains or has symptoms of Muscle Pain, Muscle Weakness, broken spine muscle spasms Oncologic The patient has no complaints or symptoms. Psychiatric The patient has no complaints or symptoms. Objective Constitutional [name], [name] ([id number]) Sitting or standing Blood Pressure is within target range for patient.. Pulse regular and within target range for patient.Marland Kitchen Respirations regular, non-labored and within target range.. Temperature is normal and within the target range for the patient.. Patient's appearance is neat and clean. Appears in no acute distress. Well nourished and well developed.. Vitals Time Taken: 10:19 AM, Height: 67 in, Source: Stated, Weight: 147 lbs, Source: Stated, BMI: 23, Temperature: 97.8 F, Pulse: 62 bpm, Respiratory Rate: 18 breaths/min, Blood Pressure: 112/68  mmHg. Respiratory Respiratory effort is easy and symmetric bilaterally. Rate is normal at rest and on room air.. Bilateral breath sounds are clear and equal in all lobes with no wheezes, rales or rhonchi.. Cardiovascular Heart rhythm and rate regular, without murmur or gallop.. Gastrointestinal (GI) Abdomen is soft and non-distended without masses or tenderness. Bowel sounds active in all quadrants.. No liver or spleen enlargement or tenderness.Marland Kitchen Psychiatric No evidence of depression, anxiety, or agitation. Calm, cooperative, and communicative. Appropriate interactions and affect.. General Notes: Wound exam; the wound itself is at the lower end of his sacrum. Small horizontal wound but with some depth. This was surrounded with some macerated skin which I removed some nonviable subcutaneous tissue on the circumference of the wound and also at the base of the wound. This was also removed. I did not see anything here that really required culturing there was no evidence of a subcutaneous infection around the wound. Integumentary (Hair, Skin) Patient has a small stage III pressure ulcer at the lower aspect of his sacrum. Wound #1 status is Open. Original cause of wound was Pressure Injury. The wound is located on the Left Coccyx. The wound measures 0.9cm length x 1.5cm width x 0.5cm depth; 1.06cm^2 area and 0.53cm^3 volume. There is fat exposed. There is no tunneling or undermining noted. There is a large amount of serous drainage noted. The wound margin is thickened. There is small (1-33%) granulation within the wound bed. There is a large (67-100%) amount of necrotic tissue within the wound bed including Eschar and Adherent Slough. The periwound skin appearance exhibited: Maceration, Moist. Periwound temperature was noted as No Abnormality. Assessment Active Problems ICD-10 [name], [name] ([id number]) [phone] - Pressure ulcer of sacral region, stage 3 G82.22 - Paraplegia,  incomplete Procedures Wound #1 Wound #1 is a Pressure Ulcer located on the Left Coccyx . There was a Skin/Subcutaneous Tissue Debridement ([id number]) debridement with total area of 1.35 sq cm performed by Maxwell Caul, MD. with the following instrument(s): Curette to remove Viable and Non-Viable tissue/material including Exudate, Fibrin/Slough, and Subcutaneous after achieving pain control using Lidocaine 4% Topical Solution. A time out was conducted prior to the start of the procedure. A Minimum amount of bleeding was controlled with Pressure. The procedure was tolerated well with a pain level of 0 throughout and a pain level of 0 following the procedure. Post Debridement Measurements: 0.3cm length x 1.5cm width x 0.5cm depth; 0.177cm^3 volume. Post debridement Stage noted as Category/Stage III. Post procedure Diagnosis Wound #1: Same as Pre-Procedure Plan Wound Cleansing: Wound #1 Left Coccyx: Clean wound with Normal Saline. Anesthetic: Wound #1 Left Coccyx: Topical Lidocaine 4% cream applied to wound bed prior to debridement Skin Barriers/Peri-Wound Care: Wound #1 Left Coccyx: Skin Prep  Primary Wound Dressing: Prisma Ag - moisten with normal saline or ky jelly Secondary Dressing: Wound #1 Left Coccyx: Dry Gauze Boardered Foam Dressing Dressing Change Frequency: Wound #1 Left Coccyx: Change dressing every other day. - unless there is a lot of drainage Follow-up Appointments: Wound #1 Left Coccyx: Return Appointment in 1 week. Off-LoadingEDGEL, [name] ([id number]) Wound #1 Left Coccyx: Turn and reposition every 2 hours Additional Orders / Instructions: Wound #1 Left Coccyx: Increase protein intake. #1 I changed his dressing to Prisma with a foam dressing. We showed his mother how to do this. We will order him supplies into his own home. He has been using Aquacel Ag for several months with good improvement per the patient's description however I thought he just  needed a little more granulation tissue which the collagen should provide Number 2T8 paraplegia however the patient is able to do his transfer from his wheelchair into the examining chair in our clinic. Electronic Signature(s) Signed: 08/19/2015 7:59:30 AM By: Baltazar Najjar MD Entered By: Baltazar Najjar on 08/17/2015 13:04:37 [name] ([id number]) -------------------------------------------------------------------------------- ROS/PFSH Details Patient Name: [name] Date of Service: 08/17/2015 9:30 AM Medical Record Patient Account Number: [id number] Number: Treating RN: Phillis Haggis 03/09/1971 (44 y.o. Other Clinician: Date of Birth/Sex: Male) Treating Grayce Budden Primary Care Physician: Physician/Extender: G Referring Physician: Weeks in Treatment: 0 Information Obtained From Patient Wound History Do you currently have one or more open woundso Yes How many open wounds do you currently haveo 1 Approximately how long have you had your woundso Sept 2016 How have you been treating your wound(s) until nowo washing, dakins, AqAg Has your wound(s) ever healed and then re-openedo No Have you had any lab work done in the past montho No Have you tested positive for an antibiotic resistant organism (MRSA, VRE)o No Have you tested positive for osteomyelitis (bone infection)o No Have you had any tests for circulation on your legso No Have you had other problems associated with your woundso Swelling Musculoskeletal Complaints and Symptoms: Positive for: Muscle Pain; Muscle Weakness Review of System Notes: broken spine muscle spasms Constitutional Symptoms (General Health) Complaints and Symptoms: No Complaints or Symptoms Eyes Complaints and Symptoms: No Complaints or Symptoms Ear/Nose/Mouth/Throat Complaints and Symptoms: No Complaints or Symptoms Hematologic/Lymphatic [name], [name] ([id number]) Complaints and Symptoms: No Complaints or  Symptoms Respiratory Complaints and Symptoms: No Complaints or Symptoms Cardiovascular Complaints and Symptoms: No Complaints or Symptoms Gastrointestinal Complaints and Symptoms: No Complaints or Symptoms Endocrine Complaints and Symptoms: No Complaints or Symptoms Genitourinary Complaints and Symptoms: No Complaints or Symptoms Immunological Complaints and Symptoms: No Complaints or Symptoms Neurologic Medical History: Positive for: Paraplegia Oncologic Complaints and Symptoms: No Complaints or Symptoms Psychiatric Complaints and Symptoms: No Complaints or Symptoms Family and Social History Cancer: No; Diabetes: No; Heart Disease: No; Hereditary Spherocytosis: No; Hypertension: Yes - Maternal Grandparents; Kidney Disease: No; Lung Disease: No; Seizures: No; Stroke: No; Thyroid Problems: No; Tuberculosis: No; Current every day smoker - 1 pack in 2 days; Marital Status - Divorced; Alcohol Use: [name], RODE ([id number]) Rarely; Drug Use: Current History - cannibis; Caffeine Use: Never; Financial Concerns: No; Food, Clothing or Shelter Needs: No; Support System Lacking: No; Transportation Concerns: No; Advanced Directives: No; Patient does not want information on Advanced Directives; Do not resuscitate: No; Living Will: No; Medical Power of Attorney: No Electronic Signature(s) Signed: 08/17/2015 5:04:44 PM By: [name] Mulling Signed: 08/19/2015 7:59:30 AM By: Baltazar Najjar MD Entered By: [name] Mulling on 08/17/2015 10:26:02 HALIL, RENTZ ([id number]) -------------------------------------------------------------------------------- SuperBill Details  Patient Name: Matthew EvangelistHARRIS, Khairi Date of Service: 08/17/2015 Medical Record Patient Account Number: [id number] Number: Treating RN: Phillis Haggisinkerton, Debi 30-Jun-1970 (44 y.o. Other Clinician: Date of Birth/Sex: Male) Treating Laveta Gilkey Primary Care Physician: Physician/Extender: G Referring Physician: Weeks in  Treatment: 0 Diagnosis Coding ICD-10 Codes Code Description L89.153 Pressure ulcer of sacral region, stage 3 G82.22 Paraplegia, incomplete Facility Procedures CPT4 Code: [id number] Description: 99213 - WOUND CARE VISIT-LEV 3 EST PT Modifier: Quantity: 1 CPT4 Code: [id number] Description: 11042 - DEB SUBQ TISSUE 20 SQ CM/< ICD-10 Description Diagnosis L89.153 Pressure ulcer of sacral region, stage 3 Modifier: Quantity: 1 Physician Procedures CPT4 Code: [id number] Description: WC PHYS LEVEL 3 o NEW PT ICD-10 Description Diagnosis L89.153 Pressure ulcer of sacral region, stage 3 Modifier: Quantity: 1 CPT4 Code: [id number] Description: 11042 - WC PHYS SUBQ TISS 20 SQ CM ICD-10 Description Diagnosis L89.153 Pressure ulcer of sacral region, stage 3 Modifier: Quantity: 1 Electronic Signature(s) Signed: 08/19/2015 7:59:30 AM By: Baltazar Najjarobson, Kendryck Lacroix MD Previous Signature: 08/17/2015 5:04:44 PM Version By: [name] MullingPinkerton, Debra Entered By: [name] MullingPinkerton, Debra on 08/18/2015 14:57:36

## 2015-08-19 NOTE — Progress Notes (Signed)
[name], [name] ([id number]) Visit Report for 08/17/2015 Allergy List Details Patient Name: [name], [name] Date of Service: 08/17/2015 9:30 AM Medical Record Number: [id number] Patient Account Number: [id number] Date of Birth/Sex: [birth year] (45 y.o. Male) Treating RN: Phillis HaggisPinkerton, Debi Primary Care Physician: Other Clinician: Referring Physician: Treating Physician/Extender: Maxwell CaulOBSON, MICHAEL G Weeks in Treatment: 0 Electronic Signature(s) Signed: 08/17/2015 5:04:44 PM By: Alejandro MullingPinkerton, Debra Entered By: Alejandro MullingPinkerton, Debra on 08/17/2015 10:20:39 [name], [name] ([id number]) -------------------------------------------------------------------------------- Arrival Information Details Patient Name: [name], [name] Date of Service: 08/17/2015 9:30 AM Medical Record Number: [id number] Patient Account Number: [id number] Date of Birth/Sex: [birth year] [age] y.o. Male) Treating RN: Phillis HaggisPinkerton, Debi Primary Care Physician: Other Clinician: Referring Physician: Treating Physician/Extender: Altamese CarolinaOBSON, MICHAEL G Weeks in Treatment: 0 Visit Information Patient Arrived: Wheel Chair Arrival Time: 10:19 Accompanied By: self Transfer Assistance: Other Patient Identification Verified: Yes Secondary Verification Process Yes Completed: Patient Requires Transmission-Based No Precautions: Patient Has Alerts: No Electronic Signature(s) Signed: 08/17/2015 5:04:44 PM By: Alejandro MullingPinkerton, Debra Entered By: Alejandro MullingPinkerton, Debra on 08/17/2015 10:19:22 [name], [name] ([id number]) -------------------------------------------------------------------------------- Clinic Level of Care Assessment Details Patient Name: [name], Zakee Date of Service: 08/17/2015 9:30 AM Medical Record Number: [id number] Patient Account Number: [id number] Date of Birth/Sex: [birth year] [age] y.o. Male) Treating RN: Phillis HaggisPinkerton, Debi Primary Care Physician: Other Clinician: Referring Physician: Treating Physician/Extender: Altamese CarolinaOBSON, MICHAEL G Weeks in  Treatment: 0 Clinic Level of Care Assessment Items TOOL 1 Quantity Score X - Use when EandM and Procedure is performed on INITIAL visit 1 0 ASSESSMENTS - Nursing Assessment / Reassessment X - General Physical Exam (combine w/ comprehensive assessment (listed just 1 20 below) when performed on new pt. evals) X - Comprehensive Assessment (HX, ROS, Risk Assessments, Wounds Hx, etc.) 1 25 ASSESSMENTS - Wound and Skin Assessment / Reassessment []  - Dermatologic / Skin Assessment (not related to wound area) 0 ASSESSMENTS - Ostomy and/or Continence Assessment and Care X - Incontinence Assessment and Management 1 10 []  - Ostomy Care Assessment and Management (repouching, etc.) 0 PROCESS - Coordination of Care []  - Simple Patient / Family Education for ongoing care 0 X - Complex (extensive) Patient / Family Education for ongoing care 1 20 X - Staff obtains ChiropractorConsents, Records, Test Results / Process Orders 1 10 []  - Staff telephones HHA, Nursing Homes / Clarify orders / etc 0 []  - Routine Transfer to another Facility (non-emergent condition) 0 []  - Routine Hospital Admission (non-emergent condition) 0 X - New Admissions / Manufacturing engineernsurance Authorizations / Ordering NPWT, Apligraf, etc. 1 15 []  - Emergency Hospital Admission (emergent condition) 0 PROCESS - Special Needs []  - Pediatric / Minor Patient Management 0 []  - Isolation Patient Management 0 [name], [name] ([id number]) []  - Hearing / Language / Visual special needs 0 []  - Assessment of Community assistance (transportation, D/C planning, etc.) 0 []  - Additional assistance / Altered mentation 0 []  - Support Surface(s) Assessment (bed, cushion, seat, etc.) 0 INTERVENTIONS - Miscellaneous []  - External ear exam 0 []  - Patient Transfer (multiple staff / Nurse, adultHoyer Lift / Similar devices) 0 []  - Simple Staple / Suture removal (25 or less) 0 []  - Complex Staple / Suture removal (26 or more) 0 []  - Hypo/Hyperglycemic Management (do not check if billed  separately) 0 []  - Ankle / Brachial Index (ABI) - do not check if billed separately 0 Has the patient been seen at the hospital within the last three years: Yes Total Score: 100 Level Of Care: New/Established - Level 3 Electronic Signature(s) Unsigned Previous Signature: 08/17/2015 5:04:44 PM Version By: Alejandro MullingPinkerton, Debra Entered  By: Alejandro Mulling on 08/18/2015 14:57:23 Signature(s): Date(s): [name], TORAN ([id number]) -------------------------------------------------------------------------------- Encounter Discharge Information Details Patient Name: [name], [name] Date of Service: 08/17/2015 9:30 AM Medical Record Number: [id number] Patient Account Number: [id number] Date of Birth/Sex: 19-Nov-1970 (45 y.o. Male) Treating RN: Phillis Haggis Primary Care Physician: Other Clinician: Referring Physician: Treating Physician/Extender: Altamese [place] in Treatment: 0 Encounter Discharge Information Items Discharge Pain Level: 0 Discharge Condition: Stable Ambulatory Status: Wheelchair Discharge Destination: Home Transportation: Private Auto Accompanied By: mother Schedule Follow-up Appointment: Yes Medication Reconciliation completed and provided to Patient/Care No [name]: Provided on Clinical Summary of Care: 08/17/2015 Form Type Recipient Paper Patient [facility] Electronic Signature(s) Signed: 08/17/2015 11:11:27 AM By: Gwenlyn Perking Entered By: Gwenlyn Perking on 08/17/2015 11:11:26 [name] ([id number]) -------------------------------------------------------------------------------- Lower Extremity Assessment Details Patient Name: [name] Date of Service: 08/17/2015 9:30 AM Medical Record Number: [id number] Patient Account Number: [id number] Date of Birth/Sex: 27-Aug-1970 (45 y.o. Male) Treating RN: Phillis Haggis Primary Care Physician: Other Clinician: Referring Physician: Treating Physician/Extender: Maxwell Caul Weeks in Treatment:  0 Electronic Signature(s) Signed: 08/17/2015 5:04:44 PM By: Alejandro Mulling Entered By: Alejandro Mulling on 08/17/2015 11:51:16 [name] ([id number]) -------------------------------------------------------------------------------- Multi Wound Chart Details Patient Name: [name] Date of Service: 08/17/2015 9:30 AM Medical Record Number: [id number] Patient Account Number: [id number] Date of Birth/Sex: Mar 10, 1971 (45 y.o. Male) Treating RN: Phillis Haggis Primary Care Physician: Other Clinician: Referring Physician: Treating Physician/Extender: Maxwell Caul Weeks in Treatment: 0 Vital Signs Height(in): 67 Pulse(bpm): 62 Weight(lbs): 147 Blood Pressure 112/68 (mmHg): Body Mass Index(BMI): 23 Temperature(F): 97.8 Respiratory Rate 18 (breaths/min): Photos: [1:No Photos] [N/A:N/A] Wound Location: [1:Left Coccyx] [N/A:N/A] Wounding Event: [1:Pressure Injury] [N/A:N/A] Primary Etiology: [1:Pressure Ulcer] [N/A:N/A] Comorbid History: [1:Paraplegia] [N/A:N/A] Date Acquired: [1:12/24/2014] [N/A:N/A] Weeks of Treatment: [1:0] [N/A:N/A] Wound Status: [1:Open] [N/A:N/A] Measurements L x W x D 0.9x1.5x0.5 [N/A:N/A] (cm) Area (cm) : [1:1.06] [N/A:N/A] Volume (cm) : [1:0.53] [N/A:N/A] Classification: [1:Category/Stage III] [N/A:N/A] Exudate Amount: [1:Large] [N/A:N/A] Exudate Type: [1:Serous] [N/A:N/A] Exudate Color: [1:amber] [N/A:N/A] Foul Odor After [1:Yes] [N/A:N/A] Cleansing: Odor Anticipated Due to No [N/A:N/A] Product Use: Wound Margin: [1:Thickened] [N/A:N/A] Granulation Amount: [1:Small (1-33%)] [N/A:N/A] Necrotic Amount: [1:Large (67-100%)] [N/A:N/A] Necrotic Tissue: [1:Eschar, Adherent Slough] [N/A:N/A] Exposed Structures: [1:Fat: Yes Fascia: No Tendon: No Muscle: No Joint: No Bone: No] [N/A:N/A] Epithelialization: None N/A N/A Periwound Skin Texture: No Abnormalities Noted N/A N/A Periwound Skin Maceration: Yes N/A N/A Moisture: Moist:  Yes Periwound Skin Color: No Abnormalities Noted N/A N/A Temperature: No Abnormality N/A N/A Tenderness on No N/A N/A Palpation: Wound Preparation: Ulcer Cleansing: N/A N/A Rinsed/Irrigated with Saline Topical Anesthetic Applied: Other: lidocaine 4% Treatment Notes Electronic Signature(s) Signed: 08/17/2015 5:04:44 PM By: Alejandro Mulling Entered By: Alejandro Mulling on 08/17/2015 10:42:08 NATHIAN, STENCIL ([id number]) -------------------------------------------------------------------------------- Multi-Disciplinary Care Plan Details Patient Name: [name] Date of Service: 08/17/2015 9:30 AM Medical Record Number: [id number] Patient Account Number: [id number] Date of Birth/Sex: [birth year] (45 y.o. Male) Treating RN: Phillis Haggis Primary Care Physician: Other Clinician: Referring Physician: Treating Physician/Extender: Altamese [place] in Treatment: 0 Active Inactive Abuse / Safety / Falls / Self Care Management Nursing Diagnoses: Potential for falls Goals: Patient will remain injury free Date Initiated: 08/17/2015 Goal Status: Active Interventions: Assess fall risk on admission and as needed Notes: Nutrition Nursing Diagnoses: Imbalanced nutrition Goals: Patient/caregiver agrees to and verbalizes understanding of need to use nutritional supplements and/or vitamins as prescribed Date Initiated: 08/17/2015 Goal Status: Active Interventions: Assess patient nutrition upon admission and as needed per policy Notes: Orientation to the Wound  Care Program Nursing Diagnoses: Knowledge deficit related to the wound healing center program Goals: Patient/caregiver will verbalize understanding of the Wound Healing Center Program DANGELO, GUZZETTA ([id number]) Date Initiated: 08/17/2015 Goal Status: Active Interventions: Provide education on orientation to the wound center Notes: Pressure Nursing Diagnoses: Knowledge deficit related to causes and risk factors for  pressure ulcer development Goals: Patient will remain free from development of additional pressure ulcers Date Initiated: 08/17/2015 Goal Status: Active Interventions: Assess: immobility, friction, shearing, incontinence upon admission and as needed Notes: Wound/Skin Impairment Nursing Diagnoses: Impaired tissue integrity Goals: Ulcer/skin breakdown will have a volume reduction of 30% by week 4 Date Initiated: 08/17/2015 Goal Status: Active Ulcer/skin breakdown will have a volume reduction of 50% by week 8 Date Initiated: 08/17/2015 Goal Status: Active Ulcer/skin breakdown will have a volume reduction of 80% by week 12 Date Initiated: 08/17/2015 Goal Status: Active Interventions: Assess patient/caregiver ability to obtain necessary supplies Assess ulceration(s) every visit Notes: Electronic Signature(s) Signed: 08/17/2015 5:04:44 PM By: Servando Snare, Leonette Most ([id number]) Entered By: Alejandro Mulling on 08/17/2015 11:52:03 [name] ([id number]) -------------------------------------------------------------------------------- Pain Assessment Details Patient Name: [name] Date of Service: 08/17/2015 9:30 AM Medical Record Number: [id number] Patient Account Number: [id number] Date of Birth/Sex: [birth year] (45 y.o. Male) Treating RN: Phillis Haggis Primary Care Physician: Other Clinician: Referring Physician: Treating Physician/Extender: Maxwell Caul Weeks in Treatment: 0 Active Problems Location of Pain Severity and Description of Pain Patient Has Paino No Site Locations Pain Management and Medication Current Pain Management: Electronic Signature(s) Signed: 08/17/2015 5:04:44 PM By: Alejandro Mulling Entered By: Alejandro Mulling on 08/17/2015 10:19:29 [name] ([id number]) -------------------------------------------------------------------------------- Patient/Caregiver Education Details Patient Name: [name] Date of Service:  08/17/2015 9:30 AM Medical Record Number: [id number] Patient Account Number: [id number] Date of Birth/Gender: Mar 12, 1971 (45 y.o. Male) Treating RN: Phillis Haggis Primary Care Physician: Other Clinician: Referring Physician: Treating Physician/Extender: Altamese [place] in Treatment: 0 Education Assessment Education Provided To: Patient Education Topics Provided Wound/Skin Impairment: Handouts: Other: change dressing as ordered Methods: Demonstration, Explain/Verbal Responses: State content correctly Electronic Signature(s) Signed: 08/17/2015 5:04:44 PM By: Alejandro Mulling Entered By: Alejandro Mulling on 08/17/2015 10:59:24 [name] ([id number]) -------------------------------------------------------------------------------- Wound Assessment Details Patient Name: [name] Date of Service: 08/17/2015 9:30 AM Medical Record Number: [id number] Patient Account Number: [id number] Date of Birth/Sex: 10/05/1970 (45 y.o. Male) Treating RN: Phillis Haggis Primary Care Physician: Other Clinician: Referring Physician: Treating Physician/Extender: Maxwell Caul Weeks in Treatment: 0 Wound Status Wound Number: 1 Primary Etiology: Pressure Ulcer Wound Location: Left Coccyx Wound Status: Open Wounding Event: Pressure Injury Comorbid History: Paraplegia Date Acquired: 12/24/2014 Weeks Of Treatment: 0 Clustered Wound: No Photos Photo Uploaded By: Alejandro Mulling on 08/17/2015 16:29:10 Wound Measurements Length: (cm) 0.9 Width: (cm) 1.5 Depth: (cm) 0.5 Area: (cm) 1.06 Volume: (cm) 0.53 % Reduction in Area: % Reduction in Volume: Epithelialization: None Tunneling: No Undermining: No Wound Description Classification: Category/Stage III Wound Margin: Thickened Exudate Amount: Large Exudate Type: Serous Exudate Color: amber Foul Odor After Cleansing: Yes Due to Product Use: No Wound Bed Granulation Amount: Small (1-33%) Exposed Structure Necrotic  Amount: Large (67-100%) Fascia Exposed: No Necrotic Quality: Eschar, Adherent Slough Fat Layer Exposed: Yes Tendon Exposed: No EPIFANIO, LABRADOR ([id number]) Muscle Exposed: No Joint Exposed: No Bone Exposed: No Periwound Skin Texture Texture Color No Abnormalities Noted: No No Abnormalities Noted: No Moisture Temperature / Pain No Abnormalities Noted: No Temperature: No Abnormality Maceration: Yes Moist: Yes Wound Preparation Ulcer Cleansing: Rinsed/Irrigated with Saline Topical Anesthetic Applied: Other: lidocaine 4%, Treatment  Notes Wound #1 (Left Coccyx) 1. Cleansed with: Clean wound with Normal Saline 2. Anesthetic Topical Lidocaine 4% cream to wound bed prior to debridement 3. Peri-wound Care: Skin Prep 4. Dressing Applied: Prisma Ag 5. Secondary Dressing Applied Bordered Foam Dressing Dry Gauze Electronic Signature(s) Signed: 08/17/2015 5:04:44 PM By: Alejandro Mulling Entered By: Alejandro Mulling on 08/17/2015 10:39:23 CAYLOR, TALLARICO ([id number]) -------------------------------------------------------------------------------- Vitals Details Patient Name: [name] Date of Service: 08/17/2015 9:30 AM Medical Record Number: [id number] Patient Account Number: [id number] Date of Birth/Sex: February 12, 1971 (45 y.o. Male) Treating RN: Phillis Haggis Primary Care Physician: Other Clinician: Referring Physician: Treating Physician/Extender: Altamese [place] in Treatment: 0 Vital Signs Time Taken: 10:19 Temperature (F): 97.8 Height (in): 67 Pulse (bpm): 62 Source: Stated Respiratory Rate (breaths/min): 18 Weight (lbs): 147 Blood Pressure (mmHg): 112/68 Source: Stated Reference Range: 80 - 120 mg / dl Body Mass Index (BMI): 23 Electronic Signature(s) Signed: 08/17/2015 5:04:44 PM By: Alejandro Mulling Entered By: Alejandro Mulling on 08/17/2015 10:20:09

## 2015-08-24 ENCOUNTER — Encounter: Payer: Medicare Other | Attending: Internal Medicine | Admitting: Internal Medicine

## 2015-08-24 DIAGNOSIS — L89153 Pressure ulcer of sacral region, stage 3: Secondary | ICD-10-CM | POA: Diagnosis not present

## 2015-08-24 DIAGNOSIS — G8222 Paraplegia, incomplete: Secondary | ICD-10-CM | POA: Diagnosis not present

## 2015-08-24 DIAGNOSIS — Z8249 Family history of ischemic heart disease and other diseases of the circulatory system: Secondary | ICD-10-CM | POA: Insufficient documentation

## 2015-08-24 DIAGNOSIS — F17210 Nicotine dependence, cigarettes, uncomplicated: Secondary | ICD-10-CM | POA: Diagnosis not present

## 2015-08-24 NOTE — Progress Notes (Addendum)
[name], [name] ([id number]) Visit Report for 08/24/2015 Arrival Information Details Patient Name: [name], [name] Date of Service: 08/24/2015 10:45 AM Medical Record Number: [id number] Patient Account Number: [id number] Date of Birth/Sex: [birth year] 2(44 y.o. Male) Treating RN: Huel CoventryWoody, Kim Primary Care Physician: Other Clinician: Referring Physician: Treating Physician/Extender: Altamese CarolinaOBSON, MICHAEL G Weeks in Treatment: 1 Visit Information History Since Last Visit Added or deleted any medications: No Patient Arrived: Wheel Chair Any new allergies or adverse reactions: No Arrival Time: 11:05 Had a fall or experienced change in No activities of daily living that may affect Accompanied By: self risk of falls: Transfer Assistance: None Signs or symptoms of abuse/neglect since last No Patient Identification Verified: Yes visito Secondary Verification Process Yes Hospitalized since last visit: No Completed: Has Dressing in Place as Prescribed: Yes Patient Requires Transmission-Based No Pain Present Now: No Precautions: Patient Has Alerts: No Electronic Signature(s) Signed: 08/24/2015 11:32:51 AM By: Elliot GurneyWoody, RN, BSN, Kim RN, BSN Entered By: Elliot GurneyWoody, RN, BSN, Kim on 08/24/2015 11:05:53 [name], [name] ([id number]) -------------------------------------------------------------------------------- Encounter Discharge Information Details Patient Name: [name], [name] Date of Service: 08/24/2015 10:45 AM Medical Record Number: [id number] Patient Account Number: [id number] Date of Birth/Sex: [birth year] (44 y.o. Male) Treating RN: Huel CoventryWoody, Kim Primary Care Physician: Other Clinician: Referring Physician: Treating Physician/Extender: Altamese CarolinaOBSON, MICHAEL G Weeks in Treatment: 1 Encounter Discharge Information Items Discharge Pain Level: 0 Discharge Condition: Stable Ambulatory Status: Wheelchair Discharge Destination: Home Transportation: Private Auto Accompanied By: self Schedule Follow-up Appointment:  Yes Medication Reconciliation completed Yes and provided to Patient/Care Sina Sumpter: Provided on Clinical Summary of Care: 08/24/2015 Form Type [facility]ip[facility] Signature(s) Signed: 08/24/2015 11:35:52 AM By: Gwenlyn PerkingMoore, Shelia Previous Signature: 08/24/2015 11:32:51 AM Version By: Elliot GurneyWoody, RN, BSN, Kim RN, BSN Entered By: Gwenlyn PerkingMoore, Shelia on 08/24/2015 11:35:51 [name], [name] ([id number]) -------------------------------------------------------------------------------- Multi Wound Chart Details Patient Name: [name], [name] Date of Service: 08/24/2015 10:45 AM Medical Record Number: [id number] Patient Account Number: [id number] Date of Birth/Sex: [birth year] 48(44 y.o. Male) Treating RN: Huel CoventryWoody, Kim Primary Care Physician: Other Clinician: Referring Physician: Treating Physician/Extender: Maxwell CaulOBSON, MICHAEL G Weeks in Treatment: 1 Vital Signs Height(in): 67 Pulse(bpm): 83 Weight(lbs): 147 Blood Pressure 112/72 (mmHg): Body Mass Index(BMI): 23 Temperature(F): 97.3 Respiratory Rate 18 (breaths/min): Photos: [1:No Photos] [N/A:N/A] Wound Location: [1:Left Coccyx] [N/A:N/A] Wounding Event: [1:Pressure Injury] [N/A:N/A] Primary Etiology: [1:Pressure Ulcer] [N/A:N/A] Comorbid History: [1:Paraplegia] [N/A:N/A] Date Acquired: [1:12/24/2014] [N/A:N/A] Weeks of Treatment: [1:1] [N/A:N/A] Wound Status: [1:Open] [N/A:N/A] Measurements L x W x D 0.5x1.3x0.5 [N/A:N/A] (cm) Area (cm) : [1:0.511] [N/A:N/A] Volume (cm) : [1:0.255] [N/A:N/A] % Reduction in Area: [1:51.80%] [N/A:N/A] % Reduction in Volume: 51.90% [N/A:N/A] Starting Position 1 [1:7] (o'clock): Ending Position 1 [1:12] (o'clock): Maximum Distance 1 0.4 (cm): Undermining: [1:Yes] [N/A:N/A] Classification: [1:Category/Stage III] [N/A:N/A] Exudate Amount: [1:Large] [N/A:N/A] Exudate Type: [1:Serous] [N/A:N/A] Exudate Color: [1:amber] [N/A:N/A] Foul Odor After [1:Yes] [N/A:N/A] Cleansing: Odor Anticipated Due to No  [N/A:N/A] Product Use: Wound Margin: [1:Thickened] [N/A:N/A] Granulation Amount: Small (1-33%) N/A N/A Necrotic Amount: Large (67-100%) N/A N/A Necrotic Tissue: Eschar, Adherent Slough N/A N/A Exposed Structures: Fat: Yes N/A N/A Fascia: No Tendon: No Muscle: No Joint: No Bone: No Epithelialization: None N/A N/A Periwound Skin Texture: No Abnormalities Noted N/A N/A Periwound Skin Maceration: Yes N/A N/A Moisture: Moist: Yes Periwound Skin Color: No Abnormalities Noted N/A N/A Temperature: No Abnormality N/A N/A Tenderness on No N/A N/A Palpation: Wound Preparation: Ulcer Cleansing: N/A N/A Rinsed/Irrigated with Saline Topical Anesthetic Applied: Other: lidocaine 4% Treatment Notes Electronic Signature(s) Signed: 08/24/2015 11:32:51 AM By: Elliot GurneyWoody, RN, BSN, Kim  RN, BSN Entered By: Elliot Gurney, RN, BSN, Kim on 08/24/2015 11:26:40 [name] ([id number]) -------------------------------------------------------------------------------- Multi-Disciplinary Care Plan Details Patient Name: [name] Date of Service: 08/24/2015 10:45 AM Medical Record Number: [id number] Patient Account Number: [id number] Date of Birth/Sex: [birth year] (45 y.o. Male) Treating RN: Huel Coventry Primary Care Physician: Other Clinician: Referring Physician: Treating Physician/Extender: Altamese [place] in Treatment: 1 Active Inactive Abuse / Safety / Falls / Self Care Management Nursing Diagnoses: Potential for falls Goals: Patient will remain injury free Date Initiated: 08/17/2015 Goal Status: Active Interventions: Assess fall risk on admission and as needed Notes: Nutrition Nursing Diagnoses: Imbalanced nutrition Goals: Patient/caregiver agrees to and verbalizes understanding of need to use nutritional supplements and/or vitamins as prescribed Date Initiated: 08/17/2015 Goal Status: Active Interventions: Assess patient nutrition upon admission and as needed per  policy Notes: Orientation to the Wound Care Program Nursing Diagnoses: Knowledge deficit related to the wound healing center program Goals: Patient/caregiver will verbalize understanding of the Wound Healing Center Program GUHAN, BRUINGTON ([id number]) Date Initiated: 08/17/2015 Goal Status: Active Interventions: Provide education on orientation to the wound center Notes: Pressure Nursing Diagnoses: Knowledge deficit related to causes and risk factors for pressure ulcer development Goals: Patient will remain free from development of additional pressure ulcers Date Initiated: 08/17/2015 Goal Status: Active Interventions: Assess: immobility, friction, shearing, incontinence upon admission and as needed Notes: Wound/Skin Impairment Nursing Diagnoses: Impaired tissue integrity Goals: Ulcer/skin breakdown will have a volume reduction of 30% by week 4 Date Initiated: 08/17/2015 Goal Status: Active Ulcer/skin breakdown will have a volume reduction of 50% by week 8 Date Initiated: 08/17/2015 Goal Status: Active Ulcer/skin breakdown will have a volume reduction of 80% by week 12 Date Initiated: 08/17/2015 Goal Status: Active Interventions: Assess patient/caregiver ability to obtain necessary supplies Assess ulceration(s) every visit Notes: Electronic Signature(s) Signed: 08/24/2015 11:32:51 AM By: Elliot Gurney, RN, BSN, Kim RN, BSN [place] [place] ([id number]) Entered By: Elliot Gurney, RN, BSN, Kim on 08/24/2015 11:22:53 [name] ([id number]) -------------------------------------------------------------------------------- Pain Assessment Details Patient Name: [name] Date of Service: 08/24/2015 10:45 AM Medical Record Number: [id number] Patient Account Number: [id number] Date of Birth/Sex: [birth year] (44 y.o. Male) Treating RN: Huel Coventry Primary Care Physician: Other Clinician: Referring Physician: Treating Physician/Extender: Maxwell Caul Weeks in Treatment: 1 Active  Problems Location of Pain Severity and Description of Pain Patient Has Paino Yes Site Locations Pain Location: Generalized Pain Pain Management and Medication Current Pain Management: Notes spine out of line spasms and discomfort. has appt to see MD about this on thurs Electronic Signature(s) Signed: 08/24/2015 11:32:51 AM By: Elliot Gurney, RN, BSN, Kim RN, BSN Entered By: Elliot Gurney, RN, BSN, Kim on 08/24/2015 11:06:49 [name] ([id number]) -------------------------------------------------------------------------------- Patient/Caregiver Education Details Patient Name: [name] Date of Service: 08/24/2015 10:45 AM Medical Record Number: [id number] Patient Account Number: [id number] Date of Birth/Gender: [birth year] (44 y.o. Male) Treating RN: Huel Coventry Primary Care Physician: Other Clinician: Referring Physician: Treating Physician/Extender: Altamese [place] in Treatment: 1 Education Assessment Education Provided To: Patient Education Topics Provided Pressure: Handouts: Pressure Ulcers: Care and Offloading, Other: change positions often to keep pressure off area Methods: Explain/Verbal Responses: State content correctly Wound/Skin Impairment: Handouts: Caring for Your Ulcer, Other: coniue wound care as prescribed Methods: Demonstration, Explain/Verbal Responses: State content correctly Electronic Signature(s) Signed: 08/24/2015 11:32:51 AM By: Elliot Gurney, RN, BSN, Kim RN, BSN Entered By: Elliot Gurney, RN, BSN, Kim on 08/24/2015 11:32:35 [name] ([id number]) -------------------------------------------------------------------------------- Wound Assessment Details Patient Name: [name] Date of Service: 08/24/2015 10:45 AM Medical Record Number: [id number] Patient  Account Number: [id number] Date of Birth/Sex: 1970-08-30 (45 y.o. Male) Treating RN: Huel Coventry Primary Care Physician: Other Clinician: Referring Physician: Treating Physician/Extender: Maxwell Caul Weeks in Treatment: 1 Wound Status Wound Number: 1 Primary Etiology: Pressure Ulcer Wound Location: Left Coccyx Wound Status: Open Wounding Event: Pressure Injury Comorbid History: Paraplegia Date Acquired: 12/24/2014 Weeks Of Treatment: 1 Clustered Wound: No Photos Photo Uploaded By: Alejandro Mulling on 08/24/2015 16:29:32 Wound Measurements Length: (cm) 0.5 % Reduction i Width: (cm) 1.3 % Reduction i Depth: (cm) 0.5 Epithelializa Area: (cm) 0.511 Tunneling: Volume: (cm) 0.255 Undermining: Starting P Ending Pos Maximum Di n Area: 51.8% n Volume: 51.9% tion: None No Yes osition (o'clock): 7 ition (o'clock): 12 stance: (cm) 0.4 Wound Description Classification: Category/Stage III Foul Odor Af Wound Margin: Thickened Due to Produ Exudate Amount: Large Exudate Type: Serous Exudate Color: amber ter Cleansing: Yes ct Use: No Wound Bed ROSBEL, BUCKNER ([id number]) Granulation Amount: Small (1-33%) Exposed Structure Necrotic Amount: Large (67-100%) Fascia Exposed: No Necrotic Quality: Eschar, Adherent Slough Fat Layer Exposed: Yes Tendon Exposed: No Muscle Exposed: No Joint Exposed: No Bone Exposed: No Periwound Skin Texture Texture Color No Abnormalities Noted: No No Abnormalities Noted: No Moisture Temperature / Pain No Abnormalities Noted: No Temperature: No Abnormality Maceration: Yes Moist: Yes Wound Preparation Ulcer Cleansing: Rinsed/Irrigated with Saline Topical Anesthetic Applied: Other: lidocaine 4%, Treatment Notes Wound #1 (Left Coccyx) 1. Cleansed with: Clean wound with Normal Saline 2. Anesthetic Topical Lidocaine 4% cream to wound bed prior to debridement 3. Peri-wound Care: Skin Prep 4. Dressing Applied: Prisma Ag 5. Secondary Dressing Applied Bordered Foam Dressing Electronic Signature(s) Signed: 08/24/2015 11:32:51 AM By: Elliot Gurney, RN, BSN, Kim RN, BSN Entered By: Elliot Gurney, RN, BSN, Kim on 08/24/2015 11:16:37 [name]  ([id number]) -------------------------------------------------------------------------------- Vitals Details Patient Name: [name] Date of Service: 08/24/2015 10:45 AM Medical Record Number: [id number] Patient Account Number: [id number] Date of Birth/Sex: 03/14/1971 (45 y.o. Male) Treating RN: Huel Coventry Primary Care Physician: Other Clinician: Referring Physician: Treating Physician/Extender: Maxwell Caul Weeks in Treatment: 1 Vital Signs Time Taken: 11:07 Temperature (F): 97.3 Height (in): 67 Pulse (bpm): 83 Weight (lbs): 147 Respiratory Rate (breaths/min): 18 Body Mass Index (BMI): 23 Blood Pressure (mmHg): 112/72 Reference Range: 80 - 120 mg / dl Electronic Signature(s) Signed: 08/24/2015 11:32:51 AM By: Elliot Gurney, RN, BSN, Kim RN, BSN Entered By: Elliot Gurney, RN, BSN, Kim on 08/24/2015 11:07:21

## 2015-08-25 NOTE — Progress Notes (Signed)
EULISES, Frank ([id number]) Visit Report for 08/24/2015 Chief Complaint Document Details Patient Name: [name], [name] Date of Service: 08/24/2015 10:45 AM Medical Record Patient Account Number: [id number] Number: Treating RN: Phillis Haggis May 16, 1970 (44 y.o. Other Clinician: Date of Birth/Sex: Male) Treating ROBSON, MICHAEL Primary Care Physician: Physician/Extender: G Referring Physician: Weeks in Treatment: 1 Information Obtained from: Patient Chief Complaint 08/17/15; the patient is recently really relocated from [facility]. He is here to establish a wound care for a stage III ulcer on his lower sacral area. Electronic Signature(s) Signed: 08/25/2015 8:00:31 AM By: Baltazar Najjar MD Entered By: Baltazar Najjar on 08/24/2015 13:11:26 ADAL, SERENO ([id number]) -------------------------------------------------------------------------------- Debridement Details Patient Name: [name] Date of Service: 08/24/2015 10:45 AM Medical Record Patient Account Number: [id number] Number: Treating RN: Phillis Haggis 1970/10/05 (44 y.o. Other Clinician: Date of Birth/Sex: Male) Treating ROBSON, MICHAEL Primary Care Physician: Physician/Extender: G Referring Physician: Weeks in Treatment: 1 Debridement Performed for Wound #1 Left Coccyx Assessment: Performed By: Physician Maxwell Caul, MD Debridement: Debridement Pre-procedure Yes Verification/Time Out Taken: Start Time: 11:20 Pain Control: Other : lidocaine 4% Level: Skin/Subcutaneous Tissue Total Area Debrided (L x 0.5 (cm) x 1.3 (cm) = 0.65 (cm) W): Tissue and other Non-Viable, Exudate, Fibrin/Slough, Subcutaneous material debrided: Instrument: Curette Bleeding: Moderate Hemostasis Achieved: Pressure End Time: 11:26 Procedural Pain: 0 Post Procedural Pain: 0 Response to Treatment: Procedure was tolerated well Post Debridement Measurements of Total Wound Length: (cm)  0.5 Stage: Category/Stage III Width: (cm) 1.3 Depth: (cm) 0.6 Volume: (cm) 0.306 Post Procedure Diagnosis Same as Pre-procedure Electronic Signature(s) Signed: 08/24/2015 4:42:38 PM By: Alejandro Mulling Signed: 08/25/2015 8:00:31 AM By: Baltazar Najjar MD Previous Signature: 08/24/2015 11:32:51 AM Version By: Elliot Gurney RN, BSN, Kim RN, BSN Entered By: Baltazar Najjar on 08/24/2015 13:11:14 LATIF, NAZARENO ([id number]) KASPAR, ALBORNOZ ([id number]) -------------------------------------------------------------------------------- HPI Details Patient Name: [name] Date of Service: 08/24/2015 10:45 AM Medical Record Patient Account Number: [id number] Number: Treating RN: Phillis Haggis Mar 20, 1971 (44 y.o. 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 He was diagnosed with what sounds like a stage III pressure ulcer in September 2016 although this may have been present for some time longer than that perhaps July 2016. He was followed by wound care in [place]. By his description there were using Silvadene cream for a period of time and then 3 months ago change to Aquacel Ag which she is continued and changes daily with help from his mother. He has recently relocated to [facility]. He went to urgent care to follow-up on his wound on 08/04/2015 they did a culture of this wound that showed Pseudomonas and MSSA. He was placed on an antibiotic but doesn't remember which one it is. He has continued using Aquacel Ag and a foam based cover. I don't have any of his records from the wound care center in [place] although the patient states he did have an MRI that did not show osteomyelitis.  He is not aware of having a bone culture done and doesn't think that he had exposed bone on this and any point. The patient does in and out cath's and has no urinary incontinence issues. He is on appropriate bowel regimen which she administers suppositories. He is able to position himself appropriately and is  wheelchair to try and avoid pressure over this wound 08/24/15; small horizontal wound over his lower sacral area. Requires debridement of surface slough and nonviable subcutaneous tissue. There is a small amount of undermining noted no evidence of infection. On the right side of this wound there is some senescent rolled edges which may need debridement I did not do this today. Electronic Signature(s) Signed: 08/25/2015 8:00:31 AM By: Baltazar Najjarobson, Michael MD Entered By: Baltazar Najjarobson, Michael on 08/24/2015 13:12:43 [name] ([id number]) -------------------------------------------------------------------------------- Physical Exam Details Patient Name: [name] Date of Service: 08/24/2015 10:45 AM Medical Record Patient Account Number: [id number] Number: Treating RN: Phillis Haggisinkerton, Debi Apr 09, 1971 (44 y.o. Other Clinician: Date of Birth/Sex: Male) Treating ROBSON, MICHAEL Primary Care Physician: Physician/Extender: G Referring Physician: Weeks in Treatment: 1 Notes Wound exam; still a small horizontal wound, the remanent of what was a much more substantial wound at one point per her description of the patient. The wound bed required debridement although it appears to clean up quite nicely, the granulation looks healthy there is no evidence of infection Electronic Signature(s) Signed: 08/25/2015 8:00:31 AM By: Baltazar Najjarobson, Michael MD Entered By: Baltazar Najjarobson, Michael on 08/24/2015 13:13:48 [name] ([id number]) -------------------------------------------------------------------------------- Physician Orders Details Patient Name: Matthew EvangelistHARRIS, [name] Date of Service: 08/24/2015 10:45  AM Medical Record Patient Account Number: [id number] Number: Treating RN: Huel CoventryWoody, Kim Apr 09, 1971 (44 y.o. Other Clinician: Date of Birth/Sex: Male) Treating ROBSON, MICHAEL Primary Care Physician: Physician/Extender: G Referring Physician: Tania AdeWeeks in Treatment: 1 Verbal / Phone Orders: Yes Clinician: Huel CoventryWoody, Kim Read Back and Verified: Yes Diagnosis Coding Wound Cleansing Wound #1 Left Coccyx o Clean wound with Normal Saline. Anesthetic Wound #1 Left Coccyx o Topical Lidocaine 4% cream applied to wound bed prior to debridement Skin Barriers/Peri-Wound Care Wound #1 Left Coccyx o Skin Prep Primary Wound Dressing o Prisma Ag - moisten with normal saline or ky jelly Secondary Dressing Wound #1 Left Coccyx o Dry Gauze o Boardered Foam Dressing Dressing Change Frequency Wound #1 Left Coccyx o Change dressing every other day. - unless there is a lot of drainage Follow-up Appointments Wound #1 Left Coccyx o Return Appointment in 1 week. Off-Loading Wound #1 Left Coccyx o Turn and reposition every 2 hours Additional Orders / Instructions Matthew EvangelistHARRIS, Stephano ([id number]) Wound #1 Left Coccyx o Increase protein intake. Electronic Signature(s) Signed: 08/24/2015 11:32:51 AM By: Elliot GurneyWoody, RN, BSN, Kim RN, BSN Signed: 08/25/2015 8:00:31 AM By: Baltazar Najjarobson, Michael MD Entered By: Elliot GurneyWoody, RN, BSN, Kim on 08/24/2015 11:30:26 Matthew EvangelistHARRIS, Damon ([id number]) -------------------------------------------------------------------------------- Problem List Details Patient Name: Matthew EvangelistHARRIS, Daunte Date of Service: 08/24/2015 10:45 AM Medical Record Patient Account Number: [id number] Number: Treating RN: Phillis Haggisinkerton, Debi Apr 09, 1971 (44 y.o. Other Clinician: Date of Birth/Sex: Male) Treating ROBSON, MICHAEL Primary Care Physician: Physician/Extender: G Referring Physician: Weeks in Treatment: 1 Active Problems ICD-10 Encounter Code Description Active  Date Diagnosis L89.153 Pressure ulcer of sacral region, stage 3 08/17/2015 Yes G82.22 Paraplegia, incomplete 08/17/2015 Yes Inactive Problems Resolved Problems Electronic Signature(s) Signed: 08/25/2015 8:00:31 AM By: Baltazar Najjarobson, Michael MD Entered By: Baltazar Najjarobson, Michael on 08/24/2015 13:10:48 Matthew EvangelistHARRIS, Leroy ([id number]) -------------------------------------------------------------------------------- Progress Note Details Patient Name: Matthew EvangelistHARRIS, La Date of Service: 08/24/2015 10:45 AM Medical Record Patient Account Number: [id number] Number: Treating RN: Phillis Haggisinkerton, Debi Apr 09, 1971 (44 y.o. Other Clinician: Date of Birth/Sex: Male) Treating ROBSON, MICHAEL Primary Care Physician: Physician/Extender: G Referring Physician: Weeks in Treatment: 1 Subjective Chief Complaint Information obtained from Patient 08/17/15; [place] [facility] [place] He is here to establish a wound care for a stage  III ulcer on his lower sacral area. 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 He was diagnosed with what sounds like a stage III pressure ulcer in September 2016 although this may have been present for some time longer than that perhaps July 2016. He was followed by wound care in [place]. By his description there were using Silvadene cream for a period of time and then 3 months ago change to Aquacel Ag which she is continued and changes daily with help from his mother. He has recently relocated to [facility]. He went to urgent care to follow- up on his wound on 08/04/2015 they did a culture of this wound that showed Pseudomonas and MSSA. He was placed on an antibiotic but doesn't remember which one it is. He has continued using Aquacel Ag and a foam based cover. I don't have any of his records from the wound care center  in [place] although the patient states he did have an MRI that did not show osteomyelitis. He is not aware of having a bone culture done and doesn't think that he had exposed bone on this and any point. The patient does in and out cath's and has no urinary incontinence issues. He is on appropriate bowel regimen which she administers suppositories. He is able to position himself appropriately and is wheelchair to try and avoid pressure over this wound 08/24/15; small horizontal wound over his lower sacral area. Requires debridement of surface slough and nonviable subcutaneous tissue. There is a small amount of undermining noted no evidence of infection. On the right side of this wound there is some senescent rolled edges which may need debridement I did not do this today. Objective Constitutional Vitals Time Taken: 11:07 AM, Height: 67 in, Weight: 147 lbs, BMI: 23, Temperature: 97.3 F, Pulse: 83 bpm, Respiratory Rate: 18 breaths/min, Blood Pressure: 112/72 mmHg. HARBERT, FITTERER ([id number]) Integumentary (Hair, Skin) Wound #1 status is Open. Original cause of wound was Pressure Injury. The wound is located on the Left Coccyx. The wound measures 0.5cm length x 1.3cm width x 0.5cm depth; 0.511cm^2 area and 0.255cm^3 volume. There is fat exposed. There is no tunneling noted, however, there is undermining starting at 7:00 and ending at 12:00 with a maximum distance of 0.4cm. There is a large amount of serous drainage noted. The wound margin is thickened. There is small (1-33%) granulation within the wound bed. There is a large (67-100%) amount of necrotic tissue within the wound bed including Eschar and Adherent Slough. The periwound skin appearance exhibited: Maceration, Moist. Periwound temperature was noted as No Abnormality. Assessment Active Problems ICD-10 L89.153 - Pressure ulcer of sacral region, stage 3 G82.22 - Paraplegia, incomplete Procedures Wound #1 Wound #1 is a Pressure  Ulcer located on the Left Coccyx . There was a Skin/Subcutaneous Tissue Debridement ([id number]) debridement with total area of 0.65 sq cm performed by Maxwell Caul, MD. with the following instrument(s): Curette to remove Non-Viable tissue/material including Exudate, Fibrin/Slough, and Subcutaneous after achieving pain control using Other (lidocaine 4%). A time out was conducted prior to the start of the procedure. A Moderate amount of bleeding was controlled with Pressure. The procedure was tolerated well with a pain level of 0 throughout and a pain level of 0 following the procedure. Post Debridement Measurements: 0.5cm length x 1.3cm width x 0.6cm depth; 0.306cm^3 volume. Post debridement Stage noted as Category/Stage  III. Post procedure Diagnosis Wound #1: Same as Pre-Procedure Plan Wound Cleansing: Wound #1 Left Coccyx: LENNART, GLADISH ([id number]) Clean wound with Normal Saline. Anesthetic: Wound #1 Left Coccyx: Topical Lidocaine 4% cream applied to wound bed prior to debridement Skin Barriers/Peri-Wound Care: Wound #1 Left Coccyx: Skin Prep Primary Wound Dressing: Prisma Ag - moisten with normal saline or ky jelly Secondary Dressing: Wound #1 Left Coccyx: Dry Gauze Boardered Foam Dressing Dressing Change Frequency: Wound #1 Left Coccyx: Change dressing every other day. - unless there is a lot of drainage Follow-up Appointments: Wound #1 Left Coccyx: Return Appointment in 1 week. Off-Loading: Wound #1 Left Coccyx: Turn and reposition every 2 hours Additional Orders / Instructions: Wound #1 Left Coccyx: Increase protein intake. #1 at this point I see no reason to change from the Prisma/foam-based dressings change every second day. Electronic Signature(s) Signed: 08/25/2015 8:00:31 AM By: Baltazar Najjar MD Entered By: Baltazar Najjar on 08/24/2015 13:14:22 [name]  ([id number]) -------------------------------------------------------------------------------- SuperBill Details Patient Name: [name] Date of Service: 08/24/2015 Medical Record Patient Account Number: [id number] Number: Treating RN: Phillis Haggis 20-Jan-1971 (44 y.o. Other Clinician: Date of Birth/Sex: Male) Treating ROBSON, MICHAEL Primary Care Physician: Physician/Extender: G Referring Physician: Weeks in Treatment: 1 Diagnosis Coding ICD-10 Codes Code Description L89.153 Pressure ulcer of sacral region, stage 3 G82.22 Paraplegia, incomplete Facility Procedures CPT4 Code: [id number] Description: 11042 - DEB SUBQ TISSUE 20 SQ CM/< ICD-10 Description Diagnosis L89.153 Pressure ulcer of sacral region, stage 3 Modifier: Quantity: 1 Physician Procedures CPT4 Code: [id number] Description: 11042 - WC PHYS SUBQ TISS 20 SQ CM ICD-10 Description Diagnosis L89.153 Pressure ulcer of sacral region, stage 3 Modifier: Quantity: 1 Electronic Signature(s) Signed: 08/25/2015 8:00:31 AM By: Baltazar Najjar MD Entered By: Baltazar Najjar on 08/24/2015 13:14:41

## 2015-08-26 DIAGNOSIS — Z9689 Presence of other specified functional implants: Secondary | ICD-10-CM | POA: Diagnosis not present

## 2015-08-26 DIAGNOSIS — Z720 Tobacco use: Secondary | ICD-10-CM | POA: Diagnosis not present

## 2015-08-26 DIAGNOSIS — G8220 Paraplegia, unspecified: Secondary | ICD-10-CM | POA: Diagnosis not present

## 2015-08-26 DIAGNOSIS — Z789 Other specified health status: Secondary | ICD-10-CM | POA: Diagnosis not present

## 2015-08-31 ENCOUNTER — Ambulatory Visit: Payer: Medicare Other | Admitting: Internal Medicine

## 2015-09-01 ENCOUNTER — Encounter: Payer: Medicare Other | Admitting: Internal Medicine

## 2015-09-01 DIAGNOSIS — F17210 Nicotine dependence, cigarettes, uncomplicated: Secondary | ICD-10-CM | POA: Diagnosis not present

## 2015-09-01 DIAGNOSIS — Z8249 Family history of ischemic heart disease and other diseases of the circulatory system: Secondary | ICD-10-CM | POA: Diagnosis not present

## 2015-09-01 DIAGNOSIS — L89153 Pressure ulcer of sacral region, stage 3: Secondary | ICD-10-CM | POA: Diagnosis not present

## 2015-09-01 DIAGNOSIS — G8222 Paraplegia, incomplete: Secondary | ICD-10-CM | POA: Diagnosis not present

## 2015-09-02 NOTE — Progress Notes (Signed)
[name], [name] ([id number]) Visit Report for 09/01/2015 Chief Complaint Document Details Patient Name: [name], [name] Date of Service: 09/01/2015 3:00 PM Medical Record Patient Account Number: [id number] Number: Treating RN: Huel CoventryWoody, Kim [birth year] (45 y.o. Other Clinician: Date of Birth/Sex: Male) Treating ROBSON, MICHAEL Primary Care Physician: Physician/Extender: G Referring Physician: Weeks in Treatment: 2 Information Obtained from: Patient Chief Complaint 08/17/15; the patient is recently really relocated [place] [facility] [place]. He is here to establish a wound care for a stage III ulcer on his lower sacral area. Electronic Signature(s) Signed: 09/01/2015 5:31:12 PM By: Baltazar Najjarobson, Michael MD Entered By: Baltazar Najjarobson, Michael on 09/01/2015 16:50:38 [name], [name] ([id number]) -------------------------------------------------------------------------------- Debridement Details Patient Name: [name], [name] Date of Service: 09/01/2015 3:00 PM Medical Record Patient Account Number: [id number] Number: Treating RN: Huel CoventryWoody, Kim [birth year] (45 y.o. Other Clinician: Date of Birth/Sex: Male) Treating ROBSON, MICHAEL Primary Care Physician: Physician/Extender: G Referring Physician: Weeks in Treatment: 2 Debridement Performed for Wound #1 Left Coccyx Assessment: Performed By: Physician Maxwell CaulOBSON, MICHAEL G, MD Debridement: Debridement Pre-procedure Yes Verification/Time Out Taken: Start Time: 15:59 Pain Control: Other : lidociane 4% Level: Skin/Subcutaneous Tissue Total Area Debrided (L x 0.5 (cm) x 0.3 (cm) = 0.15 (cm) W): Tissue and other Viable, Non-Viable, Eschar, Fibrin/Slough, Subcutaneous material debrided: Instrument: Curette Bleeding: Moderate Hemostasis Achieved: Pressure End Time: 16:05 Procedural Pain: Insensate Post Procedural Pain: Insensate Response to Treatment: Procedure was tolerated well Post Debridement Measurements of Total Wound Length:  (cm) 0.5 Stage: Category/Stage III Width: (cm) 1.3 Depth: (cm) 0.4 Volume: (cm) 0.204 Post Procedure Diagnosis Same as Pre-procedure Electronic Signature(s) Signed: 09/01/2015 5:31:12 PM By: Baltazar Najjarobson, Michael MD Signed: 09/01/2015 5:57:40 PM By: Elliot GurneyWoody, RN, BSN, Kim RN, BSN Previous Signature: 09/01/2015 4:50:28 PM Version By: Elliot GurneyWoody, RN, BSN, Kim RN, BSN Entered By: Baltazar Najjarobson, Michael on 09/01/2015 16:50:18 [name], [name] ([id number]) [name], [name] ([id number]) -------------------------------------------------------------------------------- HPI Details Patient Name: [name], [name] Date of Service: 09/01/2015 3:00 PM Medical Record Patient Account Number: [id number] Number: Treating RN: Huel CoventryWoody, Kim [birth year] (45 y.o. 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 He was diagnosed with what sounds like a stage III pressure ulcer in September 2016 although this may have been present for some time longer than that perhaps July 2016. [place] By his description there were using Silvadene cream for a period of time and then 3 months ago change to Aquacel Ag which she is continued and changes daily with help from his mother. He has recently [place] [facility] [place]. He went to urgent care to follow-up on his wound on 08/04/2015 they did a culture of this wound that showed Pseudomonas and MSSA. He was placed on an antibiotic but doesn't remember which one it is. He has continued using Aquacel Ag and a foam based cover. I don't have any of his records from the wound care [place] although the patient states he did have an MRI that did not show  osteomyelitis. He is not aware of having a bone culture done and doesn't think that he had exposed bone on this and any point. The patient does in and out cath's and has no urinary incontinence issues. He is on appropriate bowel regimen which she administers suppositories. He is able to  position himself appropriately and is wheelchair to try and avoid pressure over this wound 08/24/15; small horizontal wound over his lower sacral area. Requires debridement of surface slough and nonviable subcutaneous tissue. There is a small amount of undermining noted no evidence of infection. On the right side of this wound there is some senescent rolled edges which may need debridement I did not do this today. 09/01/15 this is a small wound over the lower sacral area. It has tunneling superiorly. This goes roughly 1 cm. Nonviable subcutaneous tissue at the inferior aspect of the wound base. No real evidence of infection Electronic Signature(s) Signed: 09/01/2015 5:31:12 PM By: Baltazar Najjar MD Entered By: Baltazar Najjar on 09/01/2015 16:53:26 [name] ([id number]) -------------------------------------------------------------------------------- Physical Exam Details Patient Name: [name] Date of Service: 09/01/2015 3:00 PM Medical Record Patient Account Number: [id number] Number: Treating RN: Huel Coventry 01/31/1971 (45 y.o. Other Clinician: Date of Birth/Sex: Male) Treating ROBSON, MICHAEL Primary Care Physician: Physician/Extender: G Referring Physician: Weeks in Treatment: 2 Notes Wound exam; wound bed is extensively debridement. Base of this had nonviable subcutaneous tissue. Circumferential callus. It's basically a wound that probes superiorly. We will continue with the silver collagen-based dressings for now Electronic Signature(s) Signed: 09/01/2015 5:31:12 PM By: Baltazar Najjar MD Entered By: Baltazar Najjar on 09/01/2015 16:54:59 [name]  ([id number]) -------------------------------------------------------------------------------- Physician Orders Details Patient Name: [name] Date of Service: 09/01/2015 3:00 PM Medical Record Patient Account Number: [id number] Number: Treating RN: Huel Coventry 1971-03-31 (44 y.o. Other Clinician: Date of Birth/Sex: Male) Treating ROBSON, MICHAEL Primary Care Physician: Physician/Extender: G Referring Physician: Tania Ade in Treatment: 2 Verbal / Phone Orders: Yes Clinician: Huel Coventry Read Back and Verified: Yes Diagnosis Coding Wound Cleansing Wound #1 Left Coccyx o Clean wound with Normal Saline. Anesthetic Wound #1 Left Coccyx o Topical Lidocaine 4% cream applied to wound bed prior to debridement Skin Barriers/Peri-Wound Care Wound #1 Left Coccyx o Skin Prep Primary Wound Dressing o Prisma Ag - moisten with normal saline or ky jelly Secondary Dressing Wound #1 Left Coccyx o Dry Gauze o Boardered Foam Dressing Dressing Change Frequency Wound #1 Left Coccyx o Change dressing every other day. - unless there is a lot of drainage Follow-up Appointments Wound #1 Left Coccyx o Return Appointment in 1 week. Off-Loading Wound #1 Left Coccyx o Turn and reposition every 2 hours Additional Orders / Instructions OLMAN, YONO ([id number]) Wound #1 Left Coccyx o Increase protein intake. Electronic Signature(s) Signed: 09/01/2015 4:50:28 PM By: Elliot Gurney RN, BSN, Kim RN, BSN Signed: 09/01/2015 5:31:12 PM By: Baltazar Najjar MD Entered By: Elliot Gurney RN, BSN, Kim on 09/01/2015 16:10:57 LEANDREW, KEECH ([id number]) -------------------------------------------------------------------------------- Problem List Details Patient Name: [name] Date of Service: 09/01/2015 3:00 PM Medical Record Patient Account Number: [id number] Number: Treating RN: Huel Coventry April 17, 1971 (44 y.o. Other Clinician: Date of Birth/Sex: Male) Treating ROBSON,  MICHAEL Primary Care Physician: Physician/Extender: G Referring Physician: Weeks in Treatment: 2 Active Problems ICD-10 Encounter Code Description Active Date Diagnosis L89.153 Pressure ulcer of sacral region, stage 3 08/17/2015 Yes G82.22 Paraplegia, incomplete 08/17/2015 Yes Inactive Problems Resolved Problems Electronic Signature(s) Signed: 09/01/2015 5:31:12 PM By: Baltazar Najjar MD Entered By: Baltazar Najjar on 09/01/2015 16:49:58 [name] ([id number]) -------------------------------------------------------------------------------- Progress Note Details Patient Name: [name] Date of Service: 09/01/2015 3:00 PM Medical Record Patient Account Number: [id number] Number: Treating RN: Huel Coventry 01/13/1971 (44 y.o. Other Clinician: Date of Birth/Sex: Male) Treating ROBSON, MICHAEL Primary Care Physician: Physician/Extender: G Referring Physician: Weeks in Treatment: 2 Subjective Chief Complaint  Information obtained from Patient 08/17/15; the patient is recently really relocated from [facility]. He is here to establish a wound care for a stage III ulcer on his lower sacral area. 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 He was diagnosed with what sounds like a stage III pressure ulcer in September 2016 although this may have been present for some time longer than that perhaps July 2016. He was followed by wound care in [place]. By his description there were using Silvadene cream for a period of time and then 3 months ago change to Aquacel Ag which she is continued and changes daily with help from his mother. He has recently relocated to [facility]. He went to urgent care to follow- up on his wound on 08/04/2015 they did a culture of this wound that showed Pseudomonas and MSSA. He was placed on an antibiotic  but doesn't remember which one it is. He has continued using Aquacel Ag and a foam based cover. I don't have any of his records from the wound care center in [place] although the patient states he did have an MRI that did not show osteomyelitis. He is not aware of having a bone culture done and doesn't think that he had exposed bone on this and any point. The patient does in and out cath's and has no urinary incontinence issues. He is on appropriate bowel regimen which she administers suppositories. He is able to position himself appropriately and is wheelchair to try and avoid pressure over this wound 08/24/15; small horizontal wound over his lower sacral area. Requires debridement of surface slough and nonviable subcutaneous tissue. There is a small amount of undermining noted no evidence of infection. On the right side of this wound there is some senescent rolled edges which may need debridement I did not do this today. 09/01/15 this is a small wound over the lower sacral area. It has tunneling superiorly. This goes roughly 1 cm. Nonviable subcutaneous tissue at the inferior aspect of the wound base. No real evidence of infection Objective Constitutional Vitals Time Taken: 3:44 PM, Height: 67 in, Weight: 147 lbs, BMI: 23, Temperature: 98.5 F, Pulse: 84 bpm, Dejoseph, Anterio ([id number]) Respiratory Rate: 18 breaths/min, Blood Pressure: 106/65 mmHg. Integumentary (Hair, Skin) Wound #1 status is Open. Original cause of wound was Pressure Injury. The wound is located on the Left Coccyx. The wound measures 0.5cm length x 0.3cm width x 0.5cm depth; 0.118cm^2 area and 0.059cm^3 volume. There is fat exposed. There is undermining starting at 9:00 and ending at 3:00 with a maximum distance of 1cm. There is a medium amount of serous drainage noted. The wound margin is thickened. There is small (1-33%) granulation within the wound bed. There is a large (67-100%) amount of necrotic tissue within the  wound bed including Eschar and Adherent Slough. The periwound skin appearance exhibited: Dry/Scaly. Periwound temperature was noted as No Abnormality. Assessment Active Problems ICD-10 L89.153 - Pressure ulcer of sacral region, stage 3 G82.22 - Paraplegia, incomplete Procedures Wound #1 Wound #1 is a Pressure Ulcer located on the Left Coccyx . There was a Skin/Subcutaneous Tissue Debridement ([id number]) debridement with total area of 0.15 sq cm performed by Maxwell Caul, MD. with the following instrument(s): Curette to remove Viable and Non-Viable tissue/material including Fibrin/Slough, Eschar, and Subcutaneous after achieving pain control using Other (lidociane 4%). A time out was conducted  prior to the start of the procedure. A Moderate amount of bleeding was controlled with Pressure. The procedure was tolerated well with a pain level of Insensate throughout and a pain level of Insensate following the procedure. Post Debridement Measurements: 0.5cm length x 1.3cm width x 0.4cm depth; 0.204cm^3 volume. Post debridement Stage noted as Category/Stage III. Post procedure Diagnosis Wound #1: Same as Pre-Procedure Plan Wound Cleansing: DEONTRA, PEREYRA ([id number]) Wound #1 Left Coccyx: Clean wound with Normal Saline. Anesthetic: Wound #1 Left Coccyx: Topical Lidocaine 4% cream applied to wound bed prior to debridement Skin Barriers/Peri-Wound Care: Wound #1 Left Coccyx: Skin Prep Primary Wound Dressing: Prisma Ag - moisten with normal saline or ky jelly Secondary Dressing: Wound #1 Left Coccyx: Dry Gauze Boardered Foam Dressing Dressing Change Frequency: Wound #1 Left Coccyx: Change dressing every other day. - unless there is a lot of drainage Follow-up Appointments: Wound #1 Left Coccyx: Return Appointment in 1 week. Off-Loading: Wound #1 Left Coccyx: Turn and reposition every 2 hours Additional Orders / Instructions: Wound #1 Left Coccyx: Increase protein  intake. #1 continue with the silver collagen-based dressings with foam cover. Foam-based dressing. #2 he is already familiar with offloading Electronic Signature(s) Signed: 09/01/2015 5:31:12 PM By: Baltazar Najjar MD Entered By: Baltazar Najjar on 09/01/2015 16:55:48 [name] ([id number]) -------------------------------------------------------------------------------- SuperBill Details Patient Name: [name] Date of Service: 09/01/2015 Medical Record Patient Account Number: [id number] Number: Treating RN: Huel Coventry Jan 16, 1971 (44 y.o. Other Clinician: Date of Birth/Sex: Male) Treating ROBSON, MICHAEL Primary Care Physician: Physician/Extender: G Referring Physician: Weeks in Treatment: 2 Diagnosis Coding ICD-10 Codes Code Description L89.153 Pressure ulcer of sacral region, stage 3 G82.22 Paraplegia, incomplete Facility Procedures CPT4 Code: [id number] Description: 11042 - DEB SUBQ TISSUE 20 SQ CM/< ICD-10 Description Diagnosis L89.153 Pressure ulcer of sacral region, stage 3 Modifier: Quantity: 1 Physician Procedures CPT4 Code: [id number] Description: 11042 - WC PHYS SUBQ TISS 20 SQ CM ICD-10 Description Diagnosis L89.153 Pressure ulcer of sacral region, stage 3 Modifier: Quantity: 1 Electronic Signature(s) Signed: 09/01/2015 5:31:12 PM By: Baltazar Najjar MD Entered By: Baltazar Najjar on 09/01/2015 16:56:10

## 2015-09-02 NOTE — Progress Notes (Signed)
[name], [name] ([id number]) Visit Report for 09/01/2015 Arrival Information Details Patient Name: [name] Date of Service: 09/01/2015 3:00 PM Medical Record Number: [id number] Patient Account Number: [id number] Date of Birth/Sex: 11-Sep-1970 (45 y.o. Male) Treating Frank: [name] Primary Care Physician: Other Clinician: Referring Physician: Treating Physician/Extender: [name] in Treatment: 2 Visit Information History Since Last Visit Added or deleted any medications: No Patient Arrived: Wheel Chair Any new allergies or adverse reactions: No Arrival Time: 15:41 Had a fall or experienced change in No activities of daily living that may affect Accompanied By: self risk of falls: Transfer Assistance: None Signs or symptoms of abuse/neglect since last No Patient Identification Verified: Yes visito Secondary Verification Process Yes Hospitalized since last visit: No Completed: Has Dressing in Place as Prescribed: Yes Patient Requires Transmission-Based No Pain Present Now: No Precautions: Patient Has Alerts: No Electronic Signature(s) Signed: 09/01/2015 4:50:28 PM By: Matthew Gurney, Frank, BSN, [name], BSN Entered By: Matthew Gurney, Frank, BSN, Matthew on 09/01/2015 16:46:12 [name] ([id number]) -------------------------------------------------------------------------------- Encounter Discharge Information Details Patient Name: [name] Date of Service: 09/01/2015 3:00 PM Medical Record Number: [id number] Patient Account Number: [id number] Date of Birth/Sex: [birth year] (44 y.o. Male) Treating Frank: [name] Primary Care Physician: Other Clinician: Referring Physician: Treating Physician/Extender: [name] in Treatment: 2 Encounter Discharge Information Items Discharge Pain Level: 0 Discharge Condition: Stable Ambulatory Status: Wheelchair Discharge Destination: Home Private Transportation: Auto Accompanied By: self Schedule Follow-up Appointment:  Yes Medication Reconciliation completed and No provided to Patient/Care [name]: Clinical Summary of Care: Electronic Signature(s) Signed: 09/01/2015 4:50:28 PM By: Matthew Gurney, Frank, BSN, [name], BSN Entered By: Matthew Gurney, Frank, BSN, Matthew on 09/01/2015 16:11:39 [name] ([id number]) -------------------------------------------------------------------------------- Multi Wound Chart Details Patient Name: [name] Date of Service: 09/01/2015 3:00 PM Medical Record Number: [id number] Patient Account Number: [id number] Date of Birth/Sex: 30-Mar-1971 (45 y.o. Male) Treating Frank: [name] Primary Care Physician: Other Clinician: Referring Physician: Treating Physician/Extender: [name] Weeks in Treatment: 2 Vital Signs Height(in): 67 Pulse(bpm): 84 Weight(lbs): 147 Blood Pressure 106/65 (mmHg): Body Mass Index(BMI): 23 Temperature(F): 98.5 Respiratory Rate 18 (breaths/min): Photos: [1:No Photos] [N/A:N/A] Wound Location: [1:Left Coccyx] [N/A:N/A] Wounding Event: [1:Pressure Injury] [N/A:N/A] Primary Etiology: [1:Pressure Ulcer] [N/A:N/A] Comorbid History: [1:Paraplegia] [N/A:N/A] Date Acquired: [1:12/24/2014] [N/A:N/A] Weeks of Treatment: [1:2] [N/A:N/A] Wound Status: [1:Open] [N/A:N/A] Measurements L x W x D 0.5x0.3x0.5 [N/A:N/A] (cm) Area (cm) : [1:0.118] [N/A:N/A] Volume (cm) : [1:0.059] [N/A:N/A] % Reduction in Area: [1:88.90%] [N/A:N/A] % Reduction in Volume: 88.90% [N/A:N/A] Starting Position 1 [1:9] (o'clock): Ending Position 1 [1:3] (o'clock): Maximum Distance 1 1 (cm): Undermining: [1:Yes] [N/A:N/A] Classification: [1:Category/Stage III] [N/A:N/A] Exudate Amount: [1:None Present] [N/A:N/A] Foul Odor After [1:Yes] [N/A:N/A] Cleansing: Odor Anticipated Due to No [N/A:N/A] Product Use: Wound Margin: [1:Thickened] [N/A:N/A] Granulation Amount: [1:Small (1-33%)] [N/A:N/A] Necrotic Amount: [1:Large (67-100%)] [N/A:N/A] Necrotic Tissue: Eschar,  Adherent Slough N/A N/A Exposed Structures: Fat: Yes N/A N/A Fascia: No Tendon: No Muscle: No Joint: No Bone: No Epithelialization: None N/A N/A Periwound Skin Texture: No Abnormalities Noted N/A N/A Periwound Skin Dry/Scaly: Yes N/A N/A Moisture: Periwound Skin Color: No Abnormalities Noted N/A N/A Temperature: No Abnormality N/A N/A Tenderness on No N/A N/A Palpation: Wound Preparation: Ulcer Cleansing: N/A N/A Rinsed/Irrigated with Saline Topical Anesthetic Applied: Other: lidocaine 4% Treatment Notes Electronic Signature(s) Signed: 09/01/2015 4:50:28 PM By: Matthew Gurney, Frank, BSN, [name], BSN Entered By: Matthew Gurney, Frank, BSN, Matthew on 09/01/2015 15:59:18 [name] ([id number]) -------------------------------------------------------------------------------- Multi-Disciplinary Care Plan Details Patient Name: [name] Date of Service: 09/01/2015  3:00 PM Medical Record Number: [id number] Patient Account Number: [id number] Date of Birth/Sex: 1971-02-03 49(44 y.o. Male) Treating Frank: Matthew CoventryWoody, Matthew Primary Care Physician: Other Clinician: Referring Physician: Treating Physician/Extender: Matthew [name] Weeks in Treatment: 2 Active Inactive Abuse / Safety / Falls / Self Care Management Nursing Diagnoses: Potential for falls Goals: Patient will remain injury free Date Initiated: 08/17/2015 Goal Status: Active Interventions: Assess fall risk on admission and as needed Notes: Nutrition Nursing Diagnoses: Imbalanced nutrition Goals: Patient/caregiver agrees to and verbalizes understanding of need to use nutritional supplements and/or vitamins as prescribed Date Initiated: 08/17/2015 Goal Status: Active Interventions: Assess patient nutrition upon admission and as needed per policy Notes: Orientation to the Wound Care Program Nursing Diagnoses: Knowledge deficit related to the wound healing center program Goals: Patient/caregiver will verbalize understanding of the Wound  Healing Center Program Matthew EvangelistHARRIS, Frank ([id number]) Date Initiated: 08/17/2015 Goal Status: Active Interventions: Provide education on orientation to the wound center Notes: Pressure Nursing Diagnoses: Knowledge deficit related to causes and risk factors for pressure ulcer development Goals: Patient will remain free from development of additional pressure ulcers Date Initiated: 08/17/2015 Goal Status: Active Interventions: Assess: immobility, friction, shearing, incontinence upon admission and as needed Notes: Wound/Skin Impairment Nursing Diagnoses: Impaired tissue integrity Goals: Ulcer/skin breakdown will have a volume reduction of 30% by week 4 Date Initiated: 08/17/2015 Goal Status: Active Ulcer/skin breakdown will have a volume reduction of 50% by week 8 Date Initiated: 08/17/2015 Goal Status: Active Ulcer/skin breakdown will have a volume reduction of 80% by week 12 Date Initiated: 08/17/2015 Goal Status: Active Interventions: Assess patient/caregiver ability to obtain necessary supplies Assess ulceration(s) every visit Notes: Electronic Signature(s) Signed: 09/01/2015 4:50:28 PM By: Matthew GurneyWoody, Frank, BSN, [name], BSN AlphaHARRIS, [place]if[place] Heights[place] ([id number]) Entered By: Matthew GurneyWoody, Frank, BSN, Matthew on 09/01/2015 15:59:11 Matthew EvangelistHARRIS, Ketrick ([id number]) -------------------------------------------------------------------------------- Pain Assessment Details Patient Name: Matthew EvangelistHARRIS, Michio Date of Service: 09/01/2015 3:00 PM Medical Record Number: [id number] Patient Account Number: [id number] Date of Birth/Sex: 1971-02-03 (44 y.o. Male) Treating Frank: Matthew CoventryWoody, Matthew Primary Care Physician: Other Clinician: Referring Physician: Treating Physician/Extender: Matthew CaulOBSON, [name] Weeks in Treatment: 2 Active Problems Location of Pain Severity and Description of Pain Patient Has Paino No Site Locations Pain Management and Medication Current Pain Management: Electronic Signature(s) Signed: 09/01/2015 4:50:28 PM By:  Matthew GurneyWoody, Frank, BSN, [name], BSN Entered By: Matthew GurneyWoody, Frank, BSN, Matthew on 09/01/2015 15:42:04 Matthew EvangelistHARRIS, Cecil ([id number]) -------------------------------------------------------------------------------- Patient/Caregiver Education Details Patient Name: Matthew EvangelistHARRIS, Keith Date of Service: 09/01/2015 3:00 PM Medical Record Number: [id number] Patient Account Number: [id number] Date of Birth/Gender: 1971-02-03 [age] y.o. Male) Treating Frank: Matthew CoventryWoody, Matthew Primary Care Physician: Other Clinician: Referring Physician: Treating Physician/Extender: Matthew [name] Weeks in Treatment: 2 Education Assessment Education Provided To: Patient Education Topics Provided Wound/Skin Impairment: Handouts: Caring for Your Ulcer, Other: continue wound are as prescribed Methods: Demonstration, Explain/Verbal Responses: State content correctly Electronic Signature(s) Signed: 09/01/2015 4:50:28 PM By: Matthew GurneyWoody, Frank, BSN, [name], BSN Entered By: Matthew GurneyWoody, Frank, BSN, Matthew on 09/01/2015 16:12:07 Matthew EvangelistHARRIS, Ilyas ([id number]) -------------------------------------------------------------------------------- Wound Assessment Details Patient Name: Matthew EvangelistHARRIS, Vinicius Date of Service: 09/01/2015 3:00 PM Medical Record Number: [id number] Patient Account Number: [id number] Date of Birth/Sex: 1971-02-03 [age] y.o. Male) Treating Frank: Matthew CoventryWoody, Matthew Primary Care Physician: Other Clinician: Referring Physician: Treating Physician/Extender: Matthew CaulOBSON, [name] Weeks in Treatment: 2 Wound Status Wound Number: 1 Primary Etiology: Pressure Ulcer Wound Location: Left Coccyx Wound Status: Open Wounding Event: Pressure Injury Comorbid History: Paraplegia Date Acquired: 12/24/2014 Weeks Of Treatment: 2 Clustered Wound: No Photos Photo Uploaded By: Matthew GurneyWoody, Frank, BSN,  Matthew on 09/01/2015 17:56:38 Wound Measurements Length: (cm) 0.5 % Reduction i Width: (cm) 0.3 % Reduction i Depth: (cm) 0.5 Epithelializa Area: (cm) 0.118 Undermining: Volume: (cm) 0.059  Starting Ending Pos Maximum Di n Area: 88.9% n Volume: 88.9% tion: None Yes Position (o'clock): 9 ition (o'clock): 3 stance: (cm) 1 Wound Description Classification: Category/Stage III Foul Odor Af Wound Margin: Thickened Due to Produ Exudate Amount: Medium Exudate Type: Serous Exudate Color: amber ter Cleansing: Yes ct Use: No Wound Bed Granulation Amount: Small (1-33%) Exposed Structure OLYN, LANDSTROM ([id number]) Necrotic Amount: Large (67-100%) Fascia Exposed: No Necrotic Quality: Eschar, Adherent Slough Fat Layer Exposed: Yes Tendon Exposed: No Muscle Exposed: No Joint Exposed: No Bone Exposed: No Periwound Skin Texture Texture Color No Abnormalities Noted: No No Abnormalities Noted: No Moisture Temperature / Pain No Abnormalities Noted: No Temperature: No Abnormality Dry / Scaly: Yes Wound Preparation Ulcer Cleansing: Rinsed/Irrigated with Saline Topical Anesthetic Applied: Other: lidocaine 4%, Treatment Notes Wound #1 (Left Coccyx) 1. Cleansed with: Clean wound with Normal Saline 2. Anesthetic Topical Lidocaine 4% cream to wound bed prior to debridement 4. Dressing Applied: Prisma Ag 5. Secondary Dressing Applied Bordered Foam Dressing Electronic Signature(s) Signed: 09/01/2015 4:50:28 PM By: Matthew Gurney, Frank, BSN, [name], BSN Entered By: Matthew Gurney, Frank, BSN, Matthew on 09/01/2015 16:44:40 [name] ([id number]) -------------------------------------------------------------------------------- Vitals Details Patient Name: [name] Date of Service: 09/01/2015 3:00 PM Medical Record Number: [id number] Patient Account Number: [id number] Date of Birth/Sex: 21-May-1970 (45 y.o. Male) Treating Frank: [name] Primary Care Physician: Other Clinician: Referring Physician: Treating Physician/Extender: [name] Weeks in Treatment: 2 Vital Signs Time Taken: 15:44 Temperature (F): 98.5 Height (in): 67 Pulse (bpm): 84 Weight (lbs): 147 Respiratory  Rate (breaths/min): 18 Body Mass Index (BMI): 23 Blood Pressure (mmHg): 106/65 Reference Range: 80 - 120 mg / dl Electronic Signature(s) Signed: 09/01/2015 4:50:28 PM By: Matthew Gurney, Frank, BSN, [name], BSN Entered By: Matthew Gurney, Frank, BSN, Matthew on 09/01/2015 15:44:23

## 2015-09-03 ENCOUNTER — Other Ambulatory Visit: Payer: Self-pay | Admitting: Physician Assistant

## 2015-09-03 DIAGNOSIS — R1032 Left lower quadrant pain: Secondary | ICD-10-CM

## 2015-09-07 ENCOUNTER — Encounter: Payer: Medicare Other | Admitting: Internal Medicine

## 2015-09-07 DIAGNOSIS — Z8249 Family history of ischemic heart disease and other diseases of the circulatory system: Secondary | ICD-10-CM | POA: Diagnosis not present

## 2015-09-07 DIAGNOSIS — G8222 Paraplegia, incomplete: Secondary | ICD-10-CM | POA: Diagnosis not present

## 2015-09-07 DIAGNOSIS — F17210 Nicotine dependence, cigarettes, uncomplicated: Secondary | ICD-10-CM | POA: Diagnosis not present

## 2015-09-07 DIAGNOSIS — L89153 Pressure ulcer of sacral region, stage 3: Secondary | ICD-10-CM | POA: Diagnosis not present

## 2015-09-09 NOTE — Progress Notes (Signed)
[name], [name] ([id number]) Visit Report for 09/07/2015 Chief Complaint Document Details Patient Name: [name], [name] Date of Service: 09/07/2015 2:15 PM Medical Record Patient Account Number: [id number] Number: Treating RN: Huel CoventryWoody, Kim 1970-12-08 (44 y.o. Other Clinician: Date of Birth/Sex: Male) Treating Sufyaan Palma Primary Care Physician: Physician/Extender: G Referring Physician: Weeks in Treatment: 3 Information Obtained from: Patient Chief Complaint 08/17/15; the patient is recently really relocated from [facility] [place]. He is here to establish a wound care for a stage III ulcer on his lower sacral area. Electronic Signature(s) Signed: 09/08/2015 7:56:10 AM By: Baltazar Najjarobson, Dwayna Kentner MD Entered By: Baltazar Najjarobson, Blaiden Werth on 09/07/2015 14:39:23 [name], [name] ([id number]) -------------------------------------------------------------------------------- HPI Details Patient Name: [name], [name] Date of Service: 09/07/2015 2:15 PM Medical Record Patient Account Number: [id number] Number: Treating RN: Huel CoventryWoody, Kim 1970-12-08 (44 y.o. 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 He was diagnosed with what sounds like a stage III pressure ulcer in September 2016 although this may have been present for some time longer than that perhaps July 2016. [place]. By his description there were using Silvadene cream for a period of time and then 3 months ago change to Aquacel Ag which she is continued and changes daily with help from his mother. [facility] [place] [facility] [place] [facility] [place]. He went to urgent care to follow-up on his wound on 08/04/2015  they did a culture of this wound that showed Pseudomonas and MSSA. He was placed on an antibiotic but doesn't remember which one it is. He has continued using Aquacel Ag and a foam based cover. I don't have any of his records from the wound care center [place] although the patient states he did have an MRI that did not show osteomyelitis. He is not aware of having a bone culture done and doesn't think that he had exposed bone on this and any point. The patient does in and out cath's and has no urinary incontinence issues. He is on appropriate bowel regimen which she administers suppositories. He is able to position himself appropriately and is wheelchair to try and avoid pressure over this wound 08/24/15; small horizontal wound over his lower sacral area. Requires debridement of surface slough and nonviable subcutaneous tissue. There is a small amount of undermining noted no evidence of infection. On the right side of this wound there is some senescent rolled edges which may need debridement I did not do this today. 09/01/15 this is a small wound over the lower sacral area. It has tunneling superiorly. This goes roughly 1 cm. Nonviable subcutaneous tissue at the inferior aspect of the wound base. No real evidence of infection 09/07/15; small horizontal wound over the lower sacral area. Last week it had 1 cm of superior tunneling which appears to of resolved. No evidence of infection Electronic Signature(s) Signed: 09/08/2015 7:56:10 AM By: Baltazar Najjarobson, Loma Dubuque MD Entered By: Baltazar Najjarobson, Christina Gintz on 09/07/2015 14:40:04 [name], [name] ([id number]) -------------------------------------------------------------------------------- Physical Exam Details Patient Name: [name], [name] Date of Service: 09/07/2015 2:15 PM Medical Record Patient Account Number: [id number] Number: Treating RN: Huel CoventryWoody, Kim 1970-12-08 (44 y.o. Other Clinician: Date of Birth/Sex: Male) Treating Henrik Orihuela Primary  Care Physician: Physician/Extender: G Referring Physician: Weeks in Treatment: 3 Constitutional Sitting or standing Blood  Pressure is within target range for patient.. Pulse regular and within target range for patient.Marland Kitchen Respirations regular, non-labored and within target range.. Temperature is normal and within the target range for the patient.. Patient's appearance is neat and clean. Appears in no acute distress. Well nourished and well developed.. Notes Wound exam; wound bed is clean after an extensive debridement last week. The base of the wound appears to have granulation. Superior tunneling surprisingly has resolved. He appears also to have epithelialization groin down into the wound inferiorly I don't really see this as a bad outcome at the moment. There is no evidence of infection Electronic Signature(s) Signed: 09/08/2015 7:56:10 AM By: Baltazar Najjar MD Entered By: Baltazar Najjar on 09/07/2015 14:41:40 Matthew Evangelist ([id number]) -------------------------------------------------------------------------------- Physician Orders Details Patient Name: Matthew Evangelist Date of Service: 09/07/2015 2:15 PM Medical Record Patient Account Number: [id number] Number: Treating RN: Phillis Haggis Feb 21, 1971 (44 y.o. Other Clinician: Date of Birth/Sex: Male) Treating Giannamarie Paulus Primary Care Physician: Physician/Extender: G Referring Physician: Weeks in Treatment: 3 Verbal / Phone Orders: Yes ClinicianAshok Cordia, Debi Read Back and Verified: Yes Diagnosis Coding ICD-10 Coding Code Description L89.153 Pressure ulcer of sacral region, stage 3 G82.22 Paraplegia, incomplete Wound Cleansing Wound #1 Left Coccyx o Clean wound with Normal Saline. Anesthetic Wound #1 Left Coccyx o Topical Lidocaine 4% cream applied to wound bed prior to debridement Skin Barriers/Peri-Wound Care Wound #1 Left Coccyx o Skin Prep Primary Wound Dressing o Prisma Ag - moisten  with normal saline or ky jelly Secondary Dressing Wound #1 Left Coccyx o Dry Gauze o Boardered Foam Dressing Dressing Change Frequency Wound #1 Left Coccyx o Change dressing every other day. - unless there is a lot of drainage Follow-up Appointments Wound #1 Left Coccyx o Return Appointment in 1 week. AADIT, HAGOOD ([id number]) Off-Loading Wound #1 Left Coccyx o Turn and reposition every 2 hours Additional Orders / Instructions Wound #1 Left Coccyx o Increase protein intake. Electronic Signature(s) Signed: 09/08/2015 7:56:10 AM By: Baltazar Najjar MD Signed: 09/08/2015 5:19:35 PM By: Alejandro Mulling Entered By: Alejandro Mulling on 09/07/2015 14:44:17 NORFLEET, CAPERS ([id number]) -------------------------------------------------------------------------------- Problem List Details Patient Name: Matthew Evangelist Date of Service: 09/07/2015 2:15 PM Medical Record Patient Account Number: [id number] Number: Treating RN: Huel Coventry 03/20/1971 (44 y.o. Other Clinician: Date of Birth/Sex: Male) Treating Valery Chance Primary Care Physician: Physician/Extender: G Referring Physician: Weeks in Treatment: 3 Active Problems ICD-10 Encounter Code Description Active Date Diagnosis L89.153 Pressure ulcer of sacral region, stage 3 08/17/2015 Yes G82.22 Paraplegia, incomplete 08/17/2015 Yes Inactive Problems Resolved Problems Electronic Signature(s) Signed: 09/08/2015 7:56:10 AM By: Baltazar Najjar MD Entered By: Baltazar Najjar on 09/07/2015 14:38:29 Matthew Evangelist ([id number]) -------------------------------------------------------------------------------- Progress Note Details Patient Name: Matthew Evangelist Date of Service: 09/07/2015 2:15 PM Medical Record Patient Account Number: [id number] Number: Treating RN: Huel Coventry 1970/07/21 (44 y.o. Other Clinician: Date of Birth/Sex: Male) Treating Drena Ham Primary Care  Physician: Physician/Extender: G Referring Physician: Weeks in Treatment: 3 Subjective Chief Complaint Information obtained from Patient 08/17/15; the patient is recently really relocated from [facility]. He is here to establish a wound care for a stage III ulcer on his lower sacral area. 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 He was diagnosed with what sounds like a stage III pressure ulcer in September 2016 although this may have been present for some time longer than that perhaps July 2016.  He was followed by wound care in [place]. By his description there were using Silvadene cream for a period of time and then 3 months ago change to Aquacel Ag which she is continued and changes daily with help from his mother. He has recently relocated to [facility]. He went to urgent care to follow- up on his wound on 08/04/2015 they did a culture of this wound that showed Pseudomonas and MSSA. He was placed on an antibiotic but doesn't remember which one it is. He has continued using Aquacel Ag and a foam based cover. I don't have any of his records from the wound care center in [place] although the patient states he did have an MRI that did not show osteomyelitis. He is not aware of having a bone culture done and doesn't think that he had exposed bone on this and any point. The patient does in and out cath's and has no urinary incontinence issues. He is on appropriate bowel regimen which she administers suppositories. He is able to position himself appropriately and is wheelchair to try and avoid pressure over this wound 08/24/15; small horizontal wound over his lower sacral area. Requires debridement of surface slough and nonviable subcutaneous tissue. There is a small amount of undermining noted no evidence of infection. On the right side of this  wound there is some senescent rolled edges which may need debridement I did not do this today. 09/01/15 this is a small wound over the lower sacral area. It has tunneling superiorly. This goes roughly 1 cm. Nonviable subcutaneous tissue at the inferior aspect of the wound base. No real evidence of infection 09/07/15; small horizontal wound over the lower sacral area. Last week it had 1 cm of superior tunneling which appears to of resolved. No evidence of infection Objective JOURDIN, CONNORS ([id number]) Constitutional Sitting or standing Blood Pressure is within target range for patient.. Pulse regular and within target range for patient.Marland Kitchen Respirations regular, non-labored and within target range.. Temperature is normal and within the target range for the patient.. Patient's appearance is neat and clean. Appears in no acute distress. Well nourished and well developed.. Vitals Time Taken: 2:20 PM, Height: 67 in, Weight: 147 lbs, BMI: 23, Temperature: 98.3 F, Pulse: 98 bpm, Respiratory Rate: 20 breaths/min, Blood Pressure: 111/67 mmHg. General Notes: Wound exam; wound bed is clean after an extensive debridement last week. The base of the wound appears to have granulation. Superior tunneling surprisingly has resolved. He appears also to have epithelialization groin down into the wound inferiorly I don't really see this as a bad outcome at the moment. There is no evidence of infection Integumentary (Hair, Skin) Wound #1 status is Open. Original cause of wound was Pressure Injury. The wound is located on the Left Coccyx. The wound measures 0.4cm length x 1cm width x 0.4cm depth; 0.314cm^2 area and 0.126cm^3 volume. There is fat exposed. There is no tunneling or undermining noted. There is a medium amount of serous drainage noted. The wound margin is thickened. There is small (1-33%) granulation within the wound bed. There is a large (67-100%) amount of necrotic tissue within the wound bed including  Eschar and Adherent Slough. The periwound skin appearance exhibited: Dry/Scaly. Periwound temperature was noted as No Abnormality. Assessment Active Problems ICD-10 L89.153 - Pressure ulcer of sacral region, stage 3 G82.22 - Paraplegia, incomplete Plan #1 I had an expectation to change his dressing to Iodoflex today however given the general improvement especially the resolution of the superior tunneling I elected  to continue with the Prisma/foam. The skin superiorly is thick and a BX senescent-looking although I have not elected to do any further debridement in this area today IZICK, GASBARRO ([id number]) Electronic Signature(s) Signed: 09/08/2015 7:56:10 AM By: Baltazar Najjar MD Entered By: Baltazar Najjar on 09/07/2015 14:42:39 Matthew Evangelist ([id number]) -------------------------------------------------------------------------------- SuperBill Details Patient Name: Matthew Evangelist Date of Service: 09/07/2015 Medical Record Patient Account Number: [id number] Number: Treating RN: Huel Coventry Aug 20, 1970 (44 y.o. Other Clinician: Date of Birth/Sex: Male) Treating Ennis Heavner Primary Care Physician: Physician/Extender: G Referring Physician: Weeks in Treatment: 3 Diagnosis Coding ICD-10 Codes Code Description L89.153 Pressure ulcer of sacral region, stage 3 G82.22 Paraplegia, incomplete Facility Procedures CPT4 Code: [id number] Description: 99213 - WOUND CARE VISIT-LEV 3 EST PT Modifier: Quantity: 1 Physician Procedures CPT4 Code: [id number] Description: 46962 - WC PHYS LEVEL 2 - EST PT ICD-10 Description Diagnosis L89.153 Pressure ulcer of sacral region, stage 3 Modifier: Quantity: 1 Electronic Signature(s) Signed: 09/08/2015 7:56:10 AM By: Baltazar Najjar MD Signed: 09/08/2015 5:19:35 PM By: Alejandro Mulling Entered By: Alejandro Mulling on 09/07/2015 17:06:10

## 2015-09-09 NOTE — Progress Notes (Signed)
[name], [name] ([id number]) Visit Report for 09/07/2015 Arrival Information Details Patient Name: [name], [name] Date of Service: 09/07/2015 2:15 PM Medical Record Number: [id number] Patient Account Number: [id number] Date of Birth/Sex: 05/06/1970 (45 y.o. Male) Treating RN: Phillis Haggis Primary Care Physician: Other Clinician: Referring Physician: Treating Physician/Extender: Altamese Sobieski in Treatment: 3 Visit Information History Since Last Visit All ordered tests and consults were completed: No Patient Arrived: Wheel Chair Added or deleted any medications: No Arrival Time: 14:15 Any new allergies or adverse reactions: No Accompanied By: self Had a fall or experienced change in No activities of daily living that may affect Transfer Assistance: Other risk of falls: Patient Identification Verified: Yes Signs or symptoms of abuse/neglect since last No Secondary Verification Process Yes visito Completed: Hospitalized since last visit: No Patient Requires Transmission-Based No Pain Present Now: No Precautions: Patient Has Alerts: No Electronic Signature(s) Signed: 09/08/2015 5:19:35 PM By: Alejandro Mulling Entered By: Alejandro Mulling on 09/07/2015 14:20:11 [name] ([id number]) -------------------------------------------------------------------------------- Clinic Level of Care Assessment Details Patient Name: [name] Date of Service: 09/07/2015 2:15 PM Medical Record Number: [id number] Patient Account Number: [id number] Date of Birth/Sex: Apr 20, 1971 (45 y.o. Male) Treating RN: Phillis Haggis Primary Care Physician: Other Clinician: Referring Physician: Treating Physician/Extender: Altamese [place] in Treatment: 3 Clinic Level of Care Assessment Items TOOL 4 Quantity Score X - Use when only an EandM is performed on FOLLOW-UP visit 1 0 ASSESSMENTS - Nursing Assessment / Reassessment X - Reassessment of Co-morbidities (includes updates in  patient status) 1 10 X - Reassessment of Adherence to Treatment Plan 1 5 ASSESSMENTS - Wound and Skin Assessment / Reassessment X - Simple Wound Assessment / Reassessment - one wound 1 5 []  - Complex Wound Assessment / Reassessment - multiple wounds 0 []  - Dermatologic / Skin Assessment (not related to wound area) 0 ASSESSMENTS - Focused Assessment []  - Circumferential Edema Measurements - multi extremities 0 []  - Nutritional Assessment / Counseling / Intervention 0 []  - Lower Extremity Assessment (monofilament, tuning fork, pulses) 0 []  - Peripheral Arterial Disease Assessment (using hand held doppler) 0 ASSESSMENTS - Ostomy and/or Continence Assessment and Care []  - Incontinence Assessment and Management 0 []  - Ostomy Care Assessment and Management (repouching, etc.) 0 PROCESS - Coordination of Care X - Simple Patient / Family Education for ongoing care 1 15 []  - Complex (extensive) Patient / Family Education for ongoing care 0 []  - Staff obtains Chiropractor, Records, Test Results / Process Orders 0 []  - Staff telephones HHA, Nursing Homes / Clarify orders / etc 0 []  - Routine Transfer to another Facility (non-emergent condition) 0 [name], [name] ([id number]) []  - Routine Hospital Admission (non-emergent condition) 0 []  - New Admissions / Manufacturing engineer / Ordering NPWT, Apligraf, etc. 0 []  - Emergency Hospital Admission (emergent condition) 0 X - Simple Discharge Coordination 1 10 []  - Complex (extensive) Discharge Coordination 0 PROCESS - Special Needs []  - Pediatric / Minor Patient Management 0 []  - Isolation Patient Management 0 []  - Hearing / Language / Visual special needs 0 []  - Assessment of Community assistance (transportation, D/C planning, etc.) 0 []  - Additional assistance / Altered mentation 0 []  - Support Surface(s) Assessment (bed, cushion, seat, etc.) 0 INTERVENTIONS - Wound Cleansing / Measurement X - Simple Wound Cleansing - one wound 1 5 []  - Complex  Wound Cleansing - multiple wounds 0 X - Wound Imaging (photographs - any number of wounds) 1 5 []  - Wound Tracing (instead of photographs) 0 X - Simple  Wound Measurement - one wound 1 5  - Complex Wound Measurement - multiple wounds 0 INTERVENTIONS - Wound Dressings X - Small Wound Dressing one or multiple wounds 1 10  - Medium Wound Dressing one or multiple wounds 0  - Large Wound Dressing one or multiple wounds 0 X - Application of Medications - topical 1 5  - Application of Medications - injection 0 INTERVENTIONS - Miscellaneous  - External ear exam 0 [name], [name] ([id number])  - Specimen Collection (cultures, biopsies, blood, body fluids, etc.) 0  - Specimen(s) / Culture(s) sent or taken to Lab for analysis 0  - Patient Transfer (multiple staff / Michiel Sites Lift / Similar devices) 0  - Simple Staple / Suture removal (25 or less) 0  - Complex Staple / Suture removal (26 or more) 0  - Hypo / Hyperglycemic Management (close monitor of Blood Glucose) 0  - Ankle / Brachial Index (ABI) - do not check if billed separately 0 X - Vital Signs 1 5 Has the patient been seen at the hospital within the last three years: Yes Total Score: 80 Level Of Care: New/Established - Level 3 Electronic Signature(s) Signed: 09/08/2015 5:19:35 PM By: Alejandro Mulling Entered By: Alejandro Mulling on 09/07/2015 17:06:17 [name] ([id number]) -------------------------------------------------------------------------------- Encounter Discharge Information Details Patient Name: [name] Date of Service: 09/07/2015 2:15 PM Medical Record Number: [id number] Patient Account Number: [id number] Date of Birth/Sex: 04/18/1971 (45 y.o. Male) Treating RN: Huel Coventry Primary Care Physician: Other Clinician: Referring Physician: Treating Physician/Extender: Altamese [place] in Treatment: 3 Encounter Discharge Information Items Discharge Pain Level: 0 Discharge Condition:  Stable Ambulatory Status: Wheelchair Discharge Destination: Home Transportation: Private Auto Accompanied By: self Schedule Follow-up Appointment: Yes Medication Reconciliation completed and provided to Patient/Care Yes Danyelle [place]: Provided on Clinical Summary of Care: 09/07/2015 Form Type Recipient Paper Patient [facility] Electronic Signature(s) Signed: 09/08/2015 5:19:35 PM By: Alejandro Mulling Previous Signature: 09/07/2015 2:44:43 PM Version By: Gwenlyn Perking Entered By: Alejandro Mulling on 09/07/2015 14:45:06 [name] ([id number]) -------------------------------------------------------------------------------- Lower Extremity Assessment Details Patient Name: [name] Date of Service: 09/07/2015 2:15 PM Medical Record Number: [id number] Patient Account Number: [id number] Date of Birth/Sex: [birth year] (44 y.o. Male) Treating RN: Phillis Haggis Primary Care Physician: Other Clinician: Referring Physician: Treating Physician/Extender: Maxwell Caul Weeks in Treatment: 3 Electronic Signature(s) Signed: 09/08/2015 5:19:35 PM By: Alejandro Mulling Entered By: Alejandro Mulling on 09/07/2015 14:20:53 [name] ([id number]) -------------------------------------------------------------------------------- Multi Wound Chart Details Patient Name: [name] Date of Service: 09/07/2015 2:15 PM Medical Record Number: [id number] Patient Account Number: [id number] Date of Birth/Sex: Dec 23, 1970 (45 y.o. Male) Treating RN: Phillis Haggis Primary Care Physician: Other Clinician: Referring Physician: Treating Physician/Extender: Maxwell Caul Weeks in Treatment: 3 Vital Signs Height(in): 67 Pulse(bpm): 98 Weight(lbs): 147 Blood Pressure 111/67 (mmHg): Body Mass Index(BMI): 23 Temperature(F): 98.3 Respiratory Rate 20 (breaths/min): Photos: [1:No Photos] [N/A:N/A] Wound Location: [1:Left Coccyx] [N/A:N/A] Wounding Event: [1:Pressure Injury] [N/A:N/A] Primary  Etiology: [1:Pressure Ulcer] [N/A:N/A] Comorbid History: [1:Paraplegia] [N/A:N/A] Date Acquired: [1:12/24/2014] [N/A:N/A] Weeks of Treatment: [1:3] [N/A:N/A] Wound Status: [1:Open] [N/A:N/A] Measurements L x W x D 0.4x1x0.4 [N/A:N/A] (cm) Area (cm) : [1:0.314] [N/A:N/A] Volume (cm) : [1:0.126] [N/A:N/A] % Reduction in Area: [1:70.40%] [N/A:N/A] % Reduction in Volume: 76.20% [N/A:N/A] Classification: [1:Category/Stage III] [N/A:N/A] Exudate Amount: [1:Medium] [N/A:N/A] Exudate Type: [1:Serous] [N/A:N/A] Exudate Color: [1:amber] [N/A:N/A] Foul Odor After [1:Yes] [N/A:N/A] Cleansing: Odor Anticipated Due to No [N/A:N/A] Product Use: Wound Margin: [1:Thickened] [N/A:N/A] Granulation Amount: [1:Small (1-33%)] [N/A:N/A] Necrotic Amount: [1:Large (67-100%)] [N/A:N/A] Necrotic Tissue: [1:Eschar, Adherent  Slough] [N/A:N/A] Exposed Structures: [1:Fat: Yes Fascia: No Tendon: No Muscle: No] [N/A:N/A] Joint: No Bone: No Epithelialization: None N/A N/A Periwound Skin Texture: No Abnormalities Noted N/A N/A Periwound Skin Dry/Scaly: Yes N/A N/A Moisture: Periwound Skin Color: No Abnormalities Noted N/A N/A Temperature: No Abnormality N/A N/A Tenderness on No N/A N/A Palpation: Wound Preparation: Ulcer Cleansing: N/A N/A Rinsed/Irrigated with Saline Topical Anesthetic Applied: Other: lidocaine 4% Treatment Notes Electronic Signature(s) Signed: 09/08/2015 5:19:35 PM By: Alejandro Mulling Entered By: Alejandro Mulling on 09/07/2015 14:32:47 [name] ([id number]) -------------------------------------------------------------------------------- Multi-Disciplinary Care Plan Details Patient Name: [name] Date of Service: 09/07/2015 2:15 PM Medical Record Number: [id number] Patient Account Number: [id number] Date of Birth/Sex: April 29, 1970 (45 y.o. Male) Treating RN: Phillis Haggis Primary Care Physician: Other Clinician: Referring Physician: Treating Physician/Extender:  Altamese [place] in Treatment: 3 Active Inactive Abuse / Safety / Falls / Self Care Management Nursing Diagnoses: Potential for falls Goals: Patient will remain injury free Date Initiated: 08/17/2015 Goal Status: Active Interventions: Assess fall risk on admission and as needed Notes: Nutrition Nursing Diagnoses: Imbalanced nutrition Goals: Patient/caregiver agrees to and verbalizes understanding of need to use nutritional supplements and/or vitamins as prescribed Date Initiated: 08/17/2015 Goal Status: Active Interventions: Assess patient nutrition upon admission and as needed per policy Notes: Orientation to the Wound Care Program Nursing Diagnoses: Knowledge deficit related to the wound healing center program Goals: Patient/caregiver will verbalize understanding of the Wound Healing Center Program [name], [name] ([id number]) Date Initiated: 08/17/2015 Goal Status: Active Interventions: Provide education on orientation to the wound center Notes: Pressure Nursing Diagnoses: Knowledge deficit related to causes and risk factors for pressure ulcer development Goals: Patient will remain free from development of additional pressure ulcers Date Initiated: 08/17/2015 Goal Status: Active Interventions: Assess: immobility, friction, shearing, incontinence upon admission and as needed Notes: Wound/Skin Impairment Nursing Diagnoses: Impaired tissue integrity Goals: Ulcer/skin breakdown will have a volume reduction of 30% by week 4 Date Initiated: 08/17/2015 Goal Status: Active Ulcer/skin breakdown will have a volume reduction of 50% by week 8 Date Initiated: 08/17/2015 Goal Status: Active Ulcer/skin breakdown will have a volume reduction of 80% by week 12 Date Initiated: 08/17/2015 Goal Status: Active Interventions: Assess patient/caregiver ability to obtain necessary supplies Assess ulceration(s) every visit Notes: Electronic Signature(s) Signed: 09/08/2015  5:19:35 PM By: Servando Snare, Leonette Most ([id number]) Entered By: Alejandro Mulling on 09/07/2015 14:32:38 [name] ([id number]) -------------------------------------------------------------------------------- Pain Assessment Details Patient Name: [name] Date of Service: 09/07/2015 2:15 PM Medical Record Number: [id number] Patient Account Number: [id number] Date of Birth/Sex: 1971-04-09 (45 y.o. Male) Treating RN: Phillis Haggis Primary Care Physician: Other Clinician: Referring Physician: Treating Physician/Extender: Maxwell Caul Weeks in Treatment: 3 Active Problems Location of Pain Severity and Description of Pain Patient Has Paino No Site Locations Pain Management and Medication Current Pain Management: Electronic Signature(s) Signed: 09/08/2015 5:19:35 PM By: Alejandro Mulling Entered By: Alejandro Mulling on 09/07/2015 14:20:18 [name] ([id number]) -------------------------------------------------------------------------------- Patient/Caregiver Education Details Patient Name: [name] Date of Service: 09/07/2015 2:15 PM Medical Record Number: [id number] Patient Account Number: [id number] Date of Birth/Gender: Apr 28, 1970 (45 y.o. Male) Treating RN: Phillis Haggis Primary Care Physician: Other Clinician: Referring Physician: Treating Physician/Extender: Altamese [place] in Treatment: 3 Education Assessment Education Provided To: Patient Education Topics Provided Wound/Skin Impairment: Handouts: Other: change dressing as ordered Methods: Demonstration, Explain/Verbal Responses: State content correctly Electronic Signature(s) Signed: 09/08/2015 5:19:35 PM By: Alejandro Mulling Entered By: Alejandro Mulling on 09/07/2015 14:45:22 [name] ([id number]) -------------------------------------------------------------------------------- Wound Assessment Details Patient Name: [name]  Date of Service: 09/07/2015 2:15  PM Medical Record Number: [id number] Patient Account Number: [id number] Date of Birth/Sex: Mar 28, 1971 94(44 y.o. Male) Treating RN: Phillis HaggisPinkerton, Debi Primary Care Physician: Other Clinician: Referring Physician: Treating Physician/Extender: Maxwell CaulOBSON, MICHAEL G Weeks in Treatment: 3 Wound Status Wound Number: 1 Primary Etiology: Pressure Ulcer Wound Location: Left Coccyx Wound Status: Open Wounding Event: Pressure Injury Comorbid History: Paraplegia Date Acquired: 12/24/2014 Weeks Of Treatment: 3 Clustered Wound: No Photos Photo Uploaded By: Alejandro MullingPinkerton, Debra on 09/08/2015 17:13:10 Wound Measurements Length: (cm) 0.4 Width: (cm) 1 Depth: (cm) 0.4 Area: (cm) 0.314 Volume: (cm) 0.126 % Reduction in Area: 70.4% % Reduction in Volume: 76.2% Epithelialization: None Tunneling: No Undermining: No Wound Description Classification: Category/Stage III Wound Margin: Thickened Exudate Amount: Medium Exudate Type: Serous Exudate Color: amber Foul Odor After Cleansing: Yes Due to Product Use: No Wound Bed Granulation Amount: Small (1-33%) Exposed Structure Necrotic Amount: Large (67-100%) Fascia Exposed: No Necrotic Quality: Eschar, Adherent Slough Fat Layer Exposed: Yes Tendon Exposed: No [name], [name] ([id number]) Muscle Exposed: No Joint Exposed: No Bone Exposed: No Periwound Skin Texture Texture Color No Abnormalities Noted: No No Abnormalities Noted: No Moisture Temperature / Pain No Abnormalities Noted: No Temperature: No Abnormality Dry / Scaly: Yes Wound Preparation Ulcer Cleansing: Rinsed/Irrigated with Saline Topical Anesthetic Applied: Other: lidocaine 4%, Treatment Notes Wound #1 (Left Coccyx) 1. Cleansed with: Clean wound with Normal Saline 2. Anesthetic Topical Lidocaine 4% cream to wound bed prior to debridement 3. Peri-wound Care: Skin Prep 4. Dressing Applied: Prisma Ag 5. Secondary Dressing Applied Bordered Foam Dressing Electronic  Signature(s) Signed: 09/08/2015 5:19:35 PM By: Alejandro MullingPinkerton, Debra Entered By: Alejandro MullingPinkerton, Debra on 09/07/2015 14:26:00 [name], [name] ([id number]) -------------------------------------------------------------------------------- Vitals Details Patient Name: [name], [name] Date of Service: 09/07/2015 2:15 PM Medical Record Number: [id number] Patient Account Number: [id number] Date of Birth/Sex: Mar 28, 1971 [age] y.o. Male) Treating RN: Phillis HaggisPinkerton, Debi Primary Care Physician: Other Clinician: Referring Physician: Treating Physician/Extender: Altamese CarolinaOBSON, MICHAEL G Weeks in Treatment: 3 Vital Signs Time Taken: 14:20 Temperature (F): 98.3 Height (in): 67 Pulse (bpm): 98 Weight (lbs): 147 Respiratory Rate (breaths/min): 20 Body Mass Index (BMI): 23 Blood Pressure (mmHg): 111/67 Reference Range: 80 - 120 mg / dl Electronic Signature(s) Signed: 09/08/2015 5:19:35 PM By: Alejandro MullingPinkerton, Debra Entered By: Alejandro MullingPinkerton, Debra on 09/07/2015 14:20:47

## 2015-09-13 ENCOUNTER — Ambulatory Visit
Admission: RE | Admit: 2015-09-13 | Discharge: 2015-09-13 | Disposition: A | Discharge status: Home / Self Care | Payer: Medicare Other | Source: Ambulatory Visit | Attending: Physician Assistant | Admitting: Physician Assistant

## 2015-09-13 DIAGNOSIS — R1032 Left lower quadrant pain: Secondary | ICD-10-CM | POA: Diagnosis not present

## 2015-09-13 MED ORDER — IOPAMIDOL (ISOVUE-300) INJECTION 61%
100.0000 mL | Freq: Once | INTRAVENOUS | Status: AC | PRN
  Administered 2015-09-13: 100 mL via INTRAVENOUS

## 2015-09-14 ENCOUNTER — Ambulatory Visit: Payer: Medicare Other | Admitting: Internal Medicine

## 2015-09-21 ENCOUNTER — Encounter: Payer: Medicare Other | Admitting: Internal Medicine

## 2015-09-21 DIAGNOSIS — G8222 Paraplegia, incomplete: Secondary | ICD-10-CM | POA: Diagnosis not present

## 2015-09-21 DIAGNOSIS — F17210 Nicotine dependence, cigarettes, uncomplicated: Secondary | ICD-10-CM | POA: Diagnosis not present

## 2015-09-21 DIAGNOSIS — Z8249 Family history of ischemic heart disease and other diseases of the circulatory system: Secondary | ICD-10-CM | POA: Diagnosis not present

## 2015-09-21 DIAGNOSIS — L89153 Pressure ulcer of sacral region, stage 3: Secondary | ICD-10-CM | POA: Diagnosis not present

## 2015-09-23 NOTE — Progress Notes (Signed)
CHRISTY, FRIEDE ([id number]) Visit Report for 09/21/2015 Arrival Information Details Patient Name: [name], [name] Date of Service: 09/21/2015 1:30 PM Medical Record Number: [id number] Patient Account Number: [id number] Date of Birth/Sex: 25-Mar-1971 (45 y.o. Male) Treating RN: Phillis Haggis Primary Care Physician: Other Clinician: Referring Physician: Treating Physician/Extender: Altamese [place] in Treatment: 5 Visit Information History Since Last Visit All ordered tests and consults were completed: No Patient Arrived: Wheel Chair Added or deleted any medications: No Arrival Time: 14:08 Any new allergies or adverse reactions: No Accompanied By: self Had a fall or experienced change in No activities of daily living that may affect Transfer Assistance: None risk of falls: Patient Identification Verified: Yes Signs or symptoms of abuse/neglect since last No Secondary Verification Process Yes visito Completed: Hospitalized since last visit: No Patient Requires Transmission-Based No Pain Present Now: No Precautions: Patient Has Alerts: No Electronic Signature(s) Signed: 09/21/2015 4:11:15 PM By: Alejandro Mulling Entered By: Alejandro Mulling on 09/21/2015 14:08:53 [name] ([id number]) -------------------------------------------------------------------------------- Encounter Discharge Information Details Patient Name: [name] Date of Service: 09/21/2015 1:30 PM Medical Record Number: [id number] Patient Account Number: [id number] Date of Birth/Sex: 1970/07/27 (45 y.o. Male) Treating RN: Phillis Haggis Primary Care Physician: Other Clinician: Referring Physician: Treating Physician/Extender: Altamese [place] in Treatment: 5 Encounter Discharge Information Items Discharge Pain Level: 0 Discharge Condition: Stable Ambulatory Status: Wheelchair Discharge Destination: Home Transportation: Private Auto Accompanied By: self Schedule Follow-up  Appointment: Yes Medication Reconciliation completed and provided to Patient/Care Yes Azul Coffie: Provided on Clinical Summary of Care: 09/21/2015 Form Type Recipient Paper Patient [facility] Electronic Signature(s) Signed: 09/21/2015 2:34:00 PM By: Gwenlyn Perking Entered By: Gwenlyn Perking on 09/21/2015 14:34:00 [name] ([id number]) -------------------------------------------------------------------------------- Lower Extremity Assessment Details Patient Name: [name] Date of Service: 09/21/2015 1:30 PM Medical Record Number: [id number] Patient Account Number: [id number] Date of Birth/Sex: [birth year] (44 y.o. Male) Treating RN: Phillis Haggis Primary Care Physician: Other Clinician: Referring Physician: Treating Physician/Extender: Maxwell Caul Weeks in Treatment: 5 Electronic Signature(s) Signed: 09/21/2015 4:11:15 PM By: Alejandro Mulling Entered By: Alejandro Mulling on 09/21/2015 14:09:42 [name] ([id number]) -------------------------------------------------------------------------------- Multi Wound Chart Details Patient Name: [name] Date of Service: 09/21/2015 1:30 PM Medical Record Number: [id number] Patient Account Number: [id number] Date of Birth/Sex: Sep 24, 1970 (46 y.o. Male) Treating RN: Phillis Haggis Primary Care Physician: Other Clinician: Referring Physician: Treating Physician/Extender: Maxwell Caul Weeks in Treatment: 5 Vital Signs Height(in): 67 Pulse(bpm): 98 Weight(lbs): 147 Blood Pressure 119/78 (mmHg): Body Mass Index(BMI): 23 Temperature(F): 97.9 Respiratory Rate 20 (breaths/min): Photos: [1:No Photos] [N/A:N/A] Wound Location: [1:Left Coccyx] [N/A:N/A] Wounding Event: [1:Pressure Injury] [N/A:N/A] Primary Etiology: [1:Pressure Ulcer] [N/A:N/A] Comorbid History: [1:Paraplegia] [N/A:N/A] Date Acquired: [1:12/24/2014] [N/A:N/A] Weeks of Treatment: [1:5] [N/A:N/A] Wound Status: [1:Open] [N/A:N/A] Measurements L x W x  D 0.4x1x0.4 [N/A:N/A] (cm) Area (cm) : [1:0.314] [N/A:N/A] Volume (cm) : [1:0.126] [N/A:N/A] % Reduction in Area: [1:70.40%] [N/A:N/A] % Reduction in Volume: 76.20% [N/A:N/A] Classification: [1:Category/Stage III] [N/A:N/A] Exudate Amount: [1:Large] [N/A:N/A] Exudate Type: [1:Serous] [N/A:N/A] Exudate Color: [1:amber] [N/A:N/A] Foul Odor After [1:Yes] [N/A:N/A] Cleansing: Odor Anticipated Due to No [N/A:N/A] Product Use: Wound Margin: [1:Thickened] [N/A:N/A] Granulation Amount: [1:Small (1-33%)] [N/A:N/A] Necrotic Amount: [1:Large (67-100%)] [N/A:N/A] Necrotic Tissue: [1:Eschar, Adherent Slough] [N/A:N/A] Exposed Structures: [1:Fat: Yes Fascia: No Tendon: No Muscle: No] [N/A:N/A] Joint: No Bone: No Epithelialization: None N/A N/A Periwound Skin Texture: No Abnormalities Noted N/A N/A Periwound Skin Dry/Scaly: Yes N/A N/A Moisture: Periwound Skin Color: No Abnormalities Noted N/A N/A Temperature: No Abnormality N/A N/A Tenderness on No N/A N/A Palpation: Wound  Preparation: Ulcer Cleansing: N/A N/A Rinsed/Irrigated with Saline Topical Anesthetic Applied: Other: lidocaine 4% Treatment Notes Electronic Signature(s) Signed: 09/21/2015 4:11:15 PM By: Alejandro Mulling Entered By: Alejandro Mulling on 09/21/2015 14:16:10 [name] ([id number]) -------------------------------------------------------------------------------- Multi-Disciplinary Care Plan Details Patient Name: [name] Date of Service: 09/21/2015 1:30 PM Medical Record Number: [id number] Patient Account Number: [id number] Date of Birth/Sex: [birth year] (45 y.o. Male) Treating RN: Phillis Haggis Primary Care Physician: Other Clinician: Referring Physician: Treating Physician/Extender: Altamese [place] in Treatment: 5 Active Inactive Abuse / Safety / Falls / Self Care Management Nursing Diagnoses: Potential for falls Goals: Patient will remain injury free Date Initiated: 08/17/2015 Goal  Status: Active Interventions: Assess fall risk on admission and as needed Notes: Nutrition Nursing Diagnoses: Imbalanced nutrition Goals: Patient/caregiver agrees to and verbalizes understanding of need to use nutritional supplements and/or vitamins as prescribed Date Initiated: 08/17/2015 Goal Status: Active Interventions: Assess patient nutrition upon admission and as needed per policy Notes: Orientation to the Wound Care Program Nursing Diagnoses: Knowledge deficit related to the wound healing center program Goals: Patient/caregiver will verbalize understanding of the Wound Healing Center Program [name], [name] ([id number]) Date Initiated: 08/17/2015 Goal Status: Active Interventions: Provide education on orientation to the wound center Notes: Pressure Nursing Diagnoses: Knowledge deficit related to causes and risk factors for pressure ulcer development Goals: Patient will remain free from development of additional pressure ulcers Date Initiated: 08/17/2015 Goal Status: Active Interventions: Assess: immobility, friction, shearing, incontinence upon admission and as needed Notes: Wound/Skin Impairment Nursing Diagnoses: Impaired tissue integrity Goals: Ulcer/skin breakdown will have a volume reduction of 30% by week 4 Date Initiated: 08/17/2015 Goal Status: Active Ulcer/skin breakdown will have a volume reduction of 50% by week 8 Date Initiated: 08/17/2015 Goal Status: Active Ulcer/skin breakdown will have a volume reduction of 80% by week 12 Date Initiated: 08/17/2015 Goal Status: Active Interventions: Assess patient/caregiver ability to obtain necessary supplies Assess ulceration(s) every visit Notes: Electronic Signature(s) Signed: 09/21/2015 4:11:15 PM By: Servando Snare, Leonette Most ([id number]) Entered By: Alejandro Mulling on 09/21/2015 14:16:03 [name]  ([id number]) -------------------------------------------------------------------------------- Pain Assessment Details Patient Name: [name] Date of Service: 09/21/2015 1:30 PM Medical Record Number: [id number] Patient Account Number: [id number] Date of Birth/Sex: October 27, 1970 (45 y.o. Male) Treating RN: Phillis Haggis Primary Care Physician: Other Clinician: Referring Physician: Treating Physician/Extender: Maxwell Caul Weeks in Treatment: 5 Active Problems Location of Pain Severity and Description of Pain Patient Has Paino No Site Locations Pain Management and Medication Current Pain Management: Electronic Signature(s) Signed: 09/21/2015 4:11:15 PM By: Alejandro Mulling Entered By: Alejandro Mulling on 09/21/2015 14:08:59 [name] ([id number]) -------------------------------------------------------------------------------- Patient/Caregiver Education Details Patient Name: [name] Date of Service: 09/21/2015 1:30 PM Medical Record Number: [id number] Patient Account Number: [id number] Date of Birth/Gender: Aug 16, 1970 (45 y.o. Male) Treating RN: Phillis Haggis Primary Care Physician: Other Clinician: Referring Physician: Treating Physician/Extender: Altamese [place] in Treatment: 5 Education Assessment Education Provided To: Patient Education Topics Provided Wound/Skin Impairment: Handouts: Other: change dressing as ordered Methods: Demonstration, Explain/Verbal Responses: State content correctly Electronic Signature(s) Signed: 09/21/2015 4:11:15 PM By: Alejandro Mulling Entered By: Alejandro Mulling on 09/21/2015 14:26:22 [name] ([id number]) -------------------------------------------------------------------------------- Wound Assessment Details Patient Name: [name] Date of Service: 09/21/2015 1:30 PM Medical Record Number: [id number] Patient Account Number: [id number] Date of Birth/Sex: 1970-10-20 (45 y.o. Male) Treating RN:  Phillis Haggis Primary Care Physician: Other Clinician: Referring Physician: Treating Physician/Extender: Maxwell Caul Weeks in Treatment: 5 Wound Status Wound Number: 1 Primary Etiology: Pressure Ulcer Wound Location: Left Coccyx Wound  Status: Open Wounding Event: Pressure Injury Comorbid History: Paraplegia Date Acquired: 12/24/2014 Weeks Of Treatment: 5 Clustered Wound: No Photos Photo Uploaded By: Alejandro MullingPinkerton, Debra on 09/21/2015 15:37:36 Wound Measurements Length: (cm) 0.4 Width: (cm) 1 Depth: (cm) 0.4 Area: (cm) 0.314 Volume: (cm) 0.126 % Reduction in Area: 70.4% % Reduction in Volume: 76.2% Epithelialization: None Tunneling: No Undermining: No Wound Description Classification: Category/Stage III Wound Margin: Thickened Exudate Amount: Large Exudate Type: Serous Exudate Color: amber Foul Odor After Cleansing: Yes Due to Product Use: No Wound Bed Granulation Amount: Small (1-33%) Exposed Structure Necrotic Amount: Large (67-100%) Fascia Exposed: No Necrotic Quality: Eschar, Adherent Slough Fat Layer Exposed: Yes Tendon Exposed: No [name], Matthew ([id number]) Muscle Exposed: No Joint Exposed: No Bone Exposed: No Periwound Skin Texture Texture Color No Abnormalities Noted: No No Abnormalities Noted: No Moisture Temperature / Pain No Abnormalities Noted: No Temperature: No Abnormality Dry / Scaly: Yes Wound Preparation Ulcer Cleansing: Rinsed/Irrigated with Saline Topical Anesthetic Applied: Other: lidocaine 4%, Treatment Notes Wound #1 (Left Coccyx) 1. Cleansed with: Clean wound with Normal Saline 2. Anesthetic Topical Lidocaine 4% cream to wound bed prior to debridement 3. Peri-wound Care: Skin Prep 4. Dressing Applied: Prisma Ag 5. Secondary Dressing Applied Bordered Foam Dressing Dry Gauze Electronic Signature(s) Signed: 09/21/2015 4:11:15 PM By: Alejandro MullingPinkerton, Debra Entered By: Alejandro MullingPinkerton, Debra on 09/21/2015 14:27:05 [name], [name]  ([id number]) -------------------------------------------------------------------------------- Vitals Details Patient Name: [name], [name] Date of Service: 09/21/2015 1:30 PM Medical Record Number: [id number] Patient Account Number: [id number] Date of Birth/Sex: Oct 04, 1970 [age] y.o. Male) Treating RN: Phillis HaggisPinkerton, Debi Primary Care Physician: Other Clinician: Referring Physician: Treating Physician/Extender: Altamese CarolinaOBSON, MICHAEL G Weeks in Treatment: 5 Vital Signs Time Taken: 14:09 Temperature (F): 97.9 Height (in): 67 Pulse (bpm): 98 Weight (lbs): 147 Respiratory Rate (breaths/min): 20 Body Mass Index (BMI): 23 Blood Pressure (mmHg): 119/78 Reference Range: 80 - 120 mg / dl Electronic Signature(s) Signed: 09/21/2015 4:11:15 PM By: Alejandro MullingPinkerton, Debra Entered By: Alejandro MullingPinkerton, Debra on 09/21/2015 14:09:35

## 2015-09-23 NOTE — Progress Notes (Signed)
[name] ([id number]) Visit Report for 09/21/2015 Chief Complaint Document Details Patient Name: [name], [name] Date of Service: 09/21/2015 1:30 PM Medical Record Patient Account Number: [id number] Number: Treating RN: Phillis Haggis 1970/08/01 (45 y.o. Other Clinician: Date of Birth/Sex: Male) Treating ROBSON, MICHAEL Primary Care Physician: Physician/Extender: G Referring Physician: Weeks in Treatment: 5 Information Obtained from: Patient Chief Complaint 08/17/15; the patient is recently really relocated from [facility]. He is here to establish a wound care for a stage III ulcer on his lower sacral area. Electronic Signature(s) Signed: 09/22/2015 5:25:36 PM By: Baltazar Najjar MD Entered By: Baltazar Najjar on 09/21/2015 15:15:13 [name] ([id number]) -------------------------------------------------------------------------------- Debridement Details Patient Name: [name] Date of Service: 09/21/2015 1:30 PM Medical Record Patient Account Number: [id number] Number: Treating RN: Phillis Haggis 12/19/1970 (45 y.o. Other Clinician: Date of Birth/Sex: Male) Treating ROBSON, MICHAEL Primary Care Physician: Physician/Extender: G Referring Physician: Weeks in Treatment: 5 Debridement Performed for Wound #1 Left Coccyx Assessment: Performed By: Physician Maxwell Caul, MD Debridement: Debridement Pre-procedure Yes Verification/Time Out Taken: Start Time: 14:20 Pain Control: Lidocaine 4% Topical Solution Level: Skin/Subcutaneous Tissue Total Area Debrided (L x 0.4 (cm) x 1 (cm) = 0.4 (cm) W): Tissue and other Viable, Non-Viable, Exudate, Fibrin/Slough, Subcutaneous material debrided: Instrument: Curette Bleeding: Minimum Hemostasis Achieved: Pressure End Time: 14:22 Procedural Pain: 0 Post Procedural Pain: 0 Response to Treatment: Procedure was tolerated well Post Debridement Measurements of Total Wound Length:  (cm) 0.4 Stage: Category/Stage III Width: (cm) 1 Depth: (cm) 0.5 Volume: (cm) 0.157 Post Procedure Diagnosis Same as Pre-procedure Electronic Signature(s) Signed: 09/21/2015 4:11:15 PM By: Alejandro Mulling Signed: 09/22/2015 5:25:36 PM By: Baltazar Najjar MD Entered By: Baltazar Najjar on 09/21/2015 15:14:38 [name] ([id number]) -------------------------------------------------------------------------------- HPI Details Patient Name: [name] Date of Service: 09/21/2015 1:30 PM Medical Record Patient Account Number: [id number] Number: Treating RN: Phillis Haggis Apr 14, 1971 (45 y.o. 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 He was diagnosed with what sounds like a stage III pressure ulcer in September 2016 although this may have been present for some time longer than that perhaps July 2016. He was followed by wound care in [place]. By his description there were using Silvadene cream for a period of time and then 3 months ago change to Aquacel Ag which she is continued and changes daily with help from his mother. He has recently relocated to [facility]. He went to urgent care to follow-up on his wound on 08/04/2015 they did a culture of this wound that showed Pseudomonas and MSSA. He was placed on an antibiotic but doesn't remember which one it is. He has continued using Aquacel Ag and a foam based cover. I don't have any of his records from the wound care center in [place] although the patient states he did have an MRI that did not show osteomyelitis. He is not aware of having a bone culture done and doesn't think that he had exposed bone on this and any  point. The patient does in and out cath's and has no urinary incontinence issues. He is on appropriate bowel regimen which she administers suppositories. He is able to position himself appropriately and is wheelchair to try and avoid pressure over this wound 08/24/15; small horizontal wound over his  lower sacral area. Requires debridement of surface slough and nonviable subcutaneous tissue. There is a small amount of undermining noted no evidence of infection. On the right side of this wound there is some senescent rolled edges which may need debridement I did not do this today. 09/01/15 this is a small wound over the lower sacral area. It has tunneling superiorly. This goes roughly 1 cm. Nonviable subcutaneous tissue at the inferior aspect of the wound base. No real evidence of infection 09/07/15; small horizontal wound over the lower sacral area. Last week it had 1 cm of superior tunneling which appears to of resolved. No evidence of infection 09/21/15; continues to have a small horizontal wound over the lower sacral area. We have been using Prisma and this has contracted. He tells me that his mother is aware medication not able to change the dressing although he states that he is able to do this Electronic Signature(s) Signed: 09/22/2015 5:25:36 PM By: Baltazar Najjar MD Entered By: Baltazar Najjar on 09/21/2015 15:16:42 SATURNINO, LIEW ([id number]) -------------------------------------------------------------------------------- Physical Exam Details Patient Name: [name] Date of Service: 09/21/2015 1:30 PM Medical Record Patient Account Number: [id number] Number: Treating RN: Phillis Haggis 1971-04-03 (45 y.o. Other Clinician: Date of Birth/Sex: Male) Treating ROBSON, MICHAEL Primary Care Physician: Physician/Extender: G Referring Physician: Weeks in Treatment: 5 Electronic Signature(s) Signed: 09/22/2015 5:25:36 PM By: Baltazar Najjar MD Entered By: Baltazar Najjar on  09/21/2015 15:17:05 [name] ([id number]) -------------------------------------------------------------------------------- Physician Orders Details Patient Name: [name] Date of Service: 09/21/2015 1:30 PM Medical Record Patient Account Number: [id number] Number: Treating RN: Phillis Haggis 1970/10/08 (44 y.o. Other Clinician: Date of Birth/Sex: Male) Treating ROBSON, MICHAEL Primary Care Physician: Physician/Extender: G Referring Physician: Weeks in Treatment: 5 Verbal / Phone Orders: Yes ClinicianAshok Cordia, Debi Read Back and Verified: Yes Diagnosis Coding Wound Cleansing Wound #1 Left Coccyx o Clean wound with Normal Saline. Anesthetic Wound #1 Left Coccyx o Topical Lidocaine 4% cream applied to wound bed prior to debridement Skin Barriers/Peri-Wound Care Wound #1 Left Coccyx o Skin Prep Primary Wound Dressing o Prisma Ag - moisten with normal saline or ky jelly Secondary Dressing Wound #1 Left Coccyx o Dry Gauze o Boardered Foam Dressing Dressing Change Frequency Wound #1 Left Coccyx o Change dressing every day. Follow-up Appointments Wound #1 Left Coccyx o Return Appointment in 1 week. Off-Loading Wound #1 Left Coccyx o Turn and reposition every 2 hours Additional Orders / Instructions CONLAN, MICELI ([id number]) Wound #1 Left Coccyx o Increase protein intake. Electronic Signature(s) Signed: 09/21/2015 4:11:15 PM By: Alejandro Mulling Signed: 09/22/2015 5:25:36 PM By: Baltazar Najjar MD Entered By: Alejandro Mulling on 09/21/2015 14:23:52 SAMIT, SYLVE ([id number]) -------------------------------------------------------------------------------- Problem List Details Patient Name: [name] Date of Service: 09/21/2015 1:30 PM Medical Record Patient Account Number: [id number] Number: Treating RN: Phillis Haggis October 02, 1970 (44 y.o. Other Clinician: Date of Birth/Sex: Male) Treating ROBSON,  MICHAEL Primary Care Physician: Physician/Extender: G Referring Physician: Weeks in Treatment: 5 Active Problems ICD-10 Encounter Code Description Active Date Diagnosis L89.153 Pressure ulcer of sacral region, stage 3 08/17/2015 Yes G82.22 Paraplegia, incomplete 08/17/2015 Yes Inactive Problems Resolved Problems Electronic Signature(s) Signed: 09/22/2015 5:25:36 PM By: Baltazar Najjar MD Entered By: Baltazar Najjar on 09/21/2015 15:14:22 [name] ([id number]) -------------------------------------------------------------------------------- Progress Note Details Patient Name: [name] Date of Service: 09/21/2015 1:30 PM Medical Record Patient Account Number: [id number] Number: Treating RN: Phillis Haggis 07-05-1970 (44 y.o. Other Clinician: Date of Birth/Sex: Male) Treating ROBSON, MICHAEL Primary Care Physician: Physician/Extender: G Referring Physician: Weeks in  Treatment: 5 Subjective Chief Complaint Information obtained from Patient 08/17/15; the patient is recently really relocated from [facility]. He is here to establish a wound care for a stage III ulcer on his lower sacral area. 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 He was diagnosed with what sounds like a stage III pressure ulcer in September 2016 although this may have been present for some time longer than that perhaps July 2016. He was followed by wound care in . By his description there were using Silvadene cream for a period of time and then 3 months ago change to Aquacel Ag which she is continued and changes daily with help from his mother. He has recently relocated to [facility]. He went to urgent care to follow- up on his wound on 08/04/2015 they did a culture of this wound that showed Pseudomonas and MSSA. He was placed on an  antibiotic but doesn't remember which one it is. He has continued using Aquacel Ag and a foam based cover. I don't have any of his records from the wound care center in  although the patient states he did have an MRI that did not show osteomyelitis. He is not aware of having a bone culture done and doesn't think that he had exposed bone on this and any point. The patient does in and out cath's and has no urinary incontinence issues. He is on appropriate bowel regimen which she administers suppositories. He is able to position himself appropriately and is wheelchair to try and avoid pressure over this wound 08/24/15; small horizontal wound over his lower sacral area. Requires debridement of surface slough and nonviable subcutaneous tissue. There is a small amount of undermining noted no evidence of infection. On the right side of this wound there is some senescent rolled edges which may need debridement I did not do this today. 09/01/15 this is a small wound over the lower sacral area. It has tunneling superiorly. This goes roughly 1 cm. Nonviable subcutaneous tissue at the inferior aspect of the wound base. No real evidence of infection 09/07/15; small horizontal wound over the lower sacral area. Last week it had 1 cm of superior tunneling which appears to of resolved. No evidence of infection 09/21/15; continues to have a small horizontal wound over the lower sacral area. We have been using Prisma and this has contracted. He tells me that his mother is aware medication not able to change the dressing although he states that he is able to do this TARL, CEPHAS ([id number]) Objective Constitutional Vitals Time Taken: 2:09 PM, Height: 67 in, Weight: 147 lbs, BMI: 23, Temperature: 97.9 F, Pulse: 98 bpm, Respiratory Rate: 20 breaths/min, Blood Pressure: 119/78 mmHg. Integumentary (Hair, Skin) Wound #1 status is Open. Original cause of wound was Pressure Injury. The wound is located on  the Left Coccyx. The wound measures 0.4cm length x 1cm width x 0.4cm depth; 0.314cm^2 area and 0.126cm^3 volume. There is fat exposed. There is no tunneling or undermining noted. There is a large amount of serous drainage noted. The wound margin is thickened. There is small (1-33%) granulation within the wound bed. There is a large (67-100%) amount of necrotic tissue within the wound bed including Eschar and Adherent Slough. The periwound skin appearance exhibited: Dry/Scaly. Periwound temperature was noted as No Abnormality. Assessment Active Problems ICD-10 L89.153 - Pressure ulcer of sacral region, stage  3 G82.22 - Paraplegia, incomplete Procedures Wound #1 Wound #1 is a Pressure Ulcer located on the Left Coccyx . There was a Skin/Subcutaneous Tissue Debridement ([id number]) debridement with total area of 0.4 sq cm performed by Maxwell CaulOBSON, MICHAEL G, MD. with the following instrument(s): Curette to remove Viable and Non-Viable tissue/material including Exudate, Fibrin/Slough, and Subcutaneous after achieving pain control using Lidocaine 4% Topical Solution. A time out was conducted prior to the start of the procedure. A Minimum amount of bleeding was controlled with Pressure. The procedure was tolerated well with a pain level of 0 throughout and a pain level of 0 following the procedure. Post Debridement Measurements: 0.4cm length x 1cm width x 0.5cm depth; 0.157cm^3 volume. Post debridement Stage noted as Category/Stage III. Post procedure Diagnosis Wound #1: Same as Pre-Procedure Matthew EvangelistHARRIS, Gavyn ([id number]) Plan Wound Cleansing: Wound #1 Left Coccyx: Clean wound with Normal Saline. Anesthetic: Wound #1 Left Coccyx: Topical Lidocaine 4% cream applied to wound bed prior to debridement Skin Barriers/Peri-Wound Care: Wound #1 Left Coccyx: Skin Prep Primary Wound Dressing: Prisma Ag - moisten with normal saline or ky jelly Secondary Dressing: Wound #1 Left Coccyx: Dry  Gauze Boardered Foam Dressing Dressing Change Frequency: Wound #1 Left Coccyx: Change dressing every day. Follow-up Appointments: Wound #1 Left Coccyx: Return Appointment in 1 week. Off-Loading: Wound #1 Left Coccyx: Turn and reposition every 2 hours Additional Orders / Instructions: Wound #1 Left Coccyx: Increase protein intake. #1 the wound still appears healthy after debridement very tiny wound but with some depth of. The 1 cm undermining is can terribly better. #2 continue Prisma, consider Hydrofera Blue if this stalls Electronic Signature(s) Signed: 09/22/2015 5:25:36 PM By: Baltazar Najjarobson, Michael MD Entered By: Baltazar Najjarobson, Michael on 09/21/2015 15:17:54 Matthew EvangelistHARRIS, Jediah ([id number]) -------------------------------------------------------------------------------- SuperBill Details Patient Name: Matthew EvangelistHARRIS, Onnie Date of Service: 09/21/2015 Medical Record Patient Account Number: [id number] Number: Treating RN: Phillis Haggisinkerton, Debi 08-13-1970 (44 y.o. Other Clinician: Date of Birth/Sex: Male) Treating ROBSON, MICHAEL Primary Care Physician: Physician/Extender: G Referring Physician: Weeks in Treatment: 5 Diagnosis Coding ICD-10 Codes Code Description L89.153 Pressure ulcer of sacral region, stage 3 G82.22 Paraplegia, incomplete Facility Procedures CPT4 Code: [id number] Description: 11042 - DEB SUBQ TISSUE 20 SQ CM/< ICD-10 Description Diagnosis L89.153 Pressure ulcer of sacral region, stage 3 Modifier: Quantity: 1 Physician Procedures CPT4 Code: [id number] Description: 11042 - WC PHYS SUBQ TISS 20 SQ CM ICD-10 Description Diagnosis L89.153 Pressure ulcer of sacral region, stage 3 Modifier: Quantity: 1 Electronic Signature(s) Signed: 09/22/2015 5:25:36 PM By: Baltazar Najjarobson, Michael MD Entered By: Baltazar Najjarobson, Michael on 09/21/2015 15:18:12

## 2015-09-28 ENCOUNTER — Encounter: Payer: Worker's Compensation | Attending: Internal Medicine | Admitting: Internal Medicine

## 2015-09-28 DIAGNOSIS — L89153 Pressure ulcer of sacral region, stage 3: Secondary | ICD-10-CM | POA: Insufficient documentation

## 2015-09-28 DIAGNOSIS — F17210 Nicotine dependence, cigarettes, uncomplicated: Secondary | ICD-10-CM | POA: Diagnosis not present

## 2015-09-28 DIAGNOSIS — G8222 Paraplegia, incomplete: Secondary | ICD-10-CM | POA: Insufficient documentation

## 2015-09-28 DIAGNOSIS — Z8249 Family history of ischemic heart disease and other diseases of the circulatory system: Secondary | ICD-10-CM | POA: Diagnosis not present

## 2015-09-30 NOTE — Progress Notes (Signed)
[name], [name] ([id number]) Visit Report for 09/28/2015 Chief Complaint Document Details Patient Name: [name], [name] Date of Service: 09/28/2015 8:45 AM Medical Record Patient Account Number: [id number] Number: Treating RN: [name], [name] [birth year] (45 y.o. Other Clinician: Date of Birth/Sex: Male) Treating [name] Primary Care Physician: Physician/Extender: [name] Referring Physician: Weeks in Frank: 6 Information Obtained from: Patient Chief Complaint 08/17/15; the patient is recently really relocated [place] [facility] [place]. He is here to establish a wound care for a stage III ulcer on his lower sacral area. Electronic Signature(s) Signed: 09/29/2015 7:56:34 AM By: Matthew Najjarobson, Matthew Russomanno MD Entered By: Matthew Najjarobson, [name] on 09/28/2015 09:29:00 [name], [name] ([id number]) -------------------------------------------------------------------------------- Debridement Details Patient Name: [name], [name] Date of Service: 09/28/2015 8:45 AM Medical Record Patient Account Number: [id number] Number: Treating RN: [name], [name] [birth year] (45 y.o. Other Clinician: Date of Birth/Sex: Male) Treating [name] Primary Care Physician: Physician/Extender: [name] Referring Physician: Weeks in Frank: 6 Debridement Performed for Wound #1 Left Coccyx Assessment: Performed By: Physician [name], Matthew Rozo G, MD Debridement: Debridement Pre-procedure Yes Verification/Time Out Taken: Start Time: 09:23 Pain Control: Lidocaine 4% Topical Solution Level: Skin/Subcutaneous Tissue Total Area Debrided (L x 0.8 (cm) x 1.5 (cm) = 1.2 (cm) W): Tissue and other Viable, Non-Viable, Exudate, Fibrin/Slough, Subcutaneous material debrided: Instrument: Curette Bleeding: Minimum Hemostasis Achieved: Pressure End Time: 09:26 Procedural Pain: 0 Post Procedural Pain: 0 Response to Frank: Procedure was tolerated well Post Debridement Measurements of Total Wound Length:  (cm) 0.8 Stage: Category/Stage III Width: (cm) 1.5 Depth: (cm) 0.7 Volume: (cm) 0.66 Post Procedure Diagnosis Same as Pre-procedure Electronic Signature(s) Signed: 09/29/2015 7:56:34 AM By: Matthew Najjarobson, Kaycie Pegues MD Signed: 09/29/2015 5:55:43 PM By: [name], [name] Entered By: [name], [name] on 09/28/2015 09:24:13 [name], [name] ([id number]) -------------------------------------------------------------------------------- HPI Details Patient Name: [name], [name] Date of Service: 09/28/2015 8:45 AM Medical Record Patient Account Number: [id number] Number: Treating RN: [name], [name] [birth year] (45 y.o. 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 He was diagnosed with what sounds like a stage III pressure ulcer in September 2016 although this may have been present for some time longer than that perhaps July 2016. [place] By his description there were using Silvadene cream for a period of time and then 3 months ago change to Aquacel Ag which she is continued and changes daily with help from his mother. [place] [facility] [place] He went to urgent care to follow-up on his wound on 08/04/2015 they did a culture of this wound that showed Pseudomonas and MSSA. He was placed on an antibiotic but doesn't remember which one it is. He has continued using Aquacel Ag and a foam based cover. I don't have any of his records from the wound care center [place] although the patient states he did have an MRI that did not show osteomyelitis. He is not aware of having a bone culture done and doesn't think that he had exposed bone on this and any  point. The patient does in and out cath's and has no urinary incontinence issues. He is on appropriate bowel regimen which she administers suppositories. He is able to position himself appropriately and is wheelchair to try and avoid pressure over this wound 08/24/15; small horizontal wound over his  lower sacral area. Requires debridement of surface slough and nonviable subcutaneous tissue. There is a small amount of undermining noted no evidence of infection. On the right side of this wound there is some senescent rolled edges which may need debridement I did not do this today. 09/01/15 this is a small wound over the lower sacral area. It has tunneling superiorly. This goes roughly 1 cm. Nonviable subcutaneous tissue at the inferior aspect of the wound base. No real evidence of infection 09/07/15; small horizontal wound over the lower sacral area. Last week it had 1 cm of superior tunneling which appears to of resolved. No evidence of infection 09/21/15; continues to have a small horizontal wound over the lower sacral area. We have been using Prisma and this has contracted. He tells me that his mother is aware medication not able to change the dressing although he states that he is able to do this 09/28/15; a small horizontal wound over his lower sacral area is all that is left of this wound which was probably much larger and deeper at one point. Depth today at 0.7 cm. Base of this had some nonviable subcutaneous tissue that I removed. Appears to be stalling on current dressing which is Radiographer, therapeutic) Signed: 09/29/2015 7:56:34 AM By: Matthew Najjar MD Entered By: [name] on 09/28/2015 09:30:38 [name] ([id number]) -------------------------------------------------------------------------------- Physical Exam Details Patient Name: Matthew Evangelist Date of Service: 09/28/2015 8:45 AM Medical Record Patient Account Number: [id number] Number: Treating RN:  Matthew Haggis [birth year] (44 y.o. Other Clinician: Date of Birth/Sex: Male) Treating Matas Burrows Primary Care Physician: Physician/Extender: [name] Referring Physician: Weeks in Frank: 6 Notes Wound exam; small amount of nonviable subcutaneous tissue which I removed with a curet. Base of this is healthy. Wound is short and thin but with a fair amount of depth is 0.7 cm Electronic Signature(s) Signed: 09/29/2015 7:56:34 AM By: Matthew Najjar MD Entered By: [name] on 09/28/2015 09:31:28 Matthew Evangelist ([id number]) -------------------------------------------------------------------------------- Physician Orders Details Patient Name: Matthew Evangelist Date of Service: 09/28/2015 8:45 AM Medical Record Patient Account Number: [id number] Number: Treating RN: Matthew Haggis 05-03-1970 (44 y.o. Other Clinician: Date of Birth/Sex: Male) Treating Izaiyah Kleinman Primary Care Physician: Physician/Extender: [name] Referring Physician: Weeks in Frank: 6 Verbal / Phone Orders: Yes ClinicianAshok Cordia, [name] Read Back and Verified: Yes Diagnosis Coding Wound Cleansing Wound #1 Left Coccyx o Clean wound with Normal Saline. Anesthetic Wound #1 Left Coccyx o Topical Lidocaine 4% cream applied to wound bed prior to debridement Skin Barriers/Peri-Wound Care Wound #1 Left Coccyx o Skin Prep Primary Wound Dressing o Other: - RTD Secondary Dressing Wound #1 Left Coccyx o Dry Gauze o Boardered Foam Dressing Dressing Change Frequency Wound #1 Left Coccyx o Change dressing every other day. Follow-up Appointments Wound #1 Left Coccyx o Return Appointment in 1 week. Off-Loading Wound #1 Left Coccyx o Turn and reposition every 2 hours Additional Orders / Instructions KANON, NOVOSEL ([id number]) Wound #1 Left Coccyx o Increase protein intake. Electronic Signature(s) Signed: 09/29/2015 7:56:34 AM By: Matthew Najjar MD Signed: 09/29/2015 5:55:43 PM By:  Matthew Mulling Entered By: Matthew Mulling on 09/28/2015 09:26:17 MCCRAE, SPECIALE ([id number]) -------------------------------------------------------------------------------- Progress Note Details Patient Name: Matthew Evangelist Date of Service: 09/28/2015 8:45 AM Medical Record Patient Account Number: [id number] Number: Treating RN: Matthew Haggis 03-Apr-1971 (44 y.o. Other Clinician: Date of Birth/Sex: Male) Treating Roselle Norton Primary Care Physician: Physician/Extender: [name] Referring Physician: Weeks in Frank: 6 Subjective Chief Complaint Information obtained from Patient 08/17/15; the patient is recently  really relocated from [place]. He is here to establish a wound care for a stage III ulcer on his lower sacral area. 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 He was diagnosed with what sounds like a stage III pressure ulcer in September 2016 although this may have been present for some time longer than that perhaps July 2016. He was followed by wound care in [place]. By his description there were using Silvadene cream for a period of time and then 3 months ago change to Aquacel Ag which she is continued and changes daily with help from his mother. He has recently relocated to [facility]. He went to urgent care to follow- up on his wound on 08/04/2015 they did a culture of this wound that showed Pseudomonas and MSSA. He was placed on an antibiotic but doesn't remember which one it is. He has continued using Aquacel Ag and a foam based cover. I don't have any of his records from the wound care center in [place] although the patient states he did have an MRI that did not show osteomyelitis. He is not aware of having a bone culture done and doesn't think that he had exposed bone on this and any point. The patient  does in and out cath's and has no urinary incontinence issues. He is on appropriate bowel regimen which she administers suppositories. He is able to position himself appropriately and is wheelchair to try and avoid pressure over this wound 08/24/15; small horizontal wound over his lower sacral area. Requires debridement of surface slough and nonviable subcutaneous tissue. There is a small amount of undermining noted no evidence of infection. On the right side of this wound there is some senescent rolled edges which may need debridement I did not do this today. 09/01/15 this is a small wound over the lower sacral area. It has tunneling superiorly. This goes roughly 1 cm. Nonviable subcutaneous tissue at the inferior aspect of the wound base. No real evidence of infection 09/07/15; small horizontal wound over the lower sacral area. Last week it had 1 cm of superior tunneling which appears to of resolved. No evidence of infection 09/21/15; continues to have a small horizontal wound over the lower sacral area. We have been using Prisma and this has contracted. He tells me that his mother is aware medication not able to change the dressing although he states that he is able to do this 09/28/15; a small horizontal wound over his lower sacral area is all that is left of this wound which was probably much larger and deeper at one point. Depth today at 0.7 cm. Base of this had some nonviable subcutaneous tissue that I removed. Appears to be stalling on current dressing which is Darrelle Wiberg, Leonette Most ([id number]) Objective Constitutional Vitals Time Taken: 9:03 AM, Height: 67 in, Weight: 147 lbs, BMI: 23, Temperature: 97.5 F, Pulse: 80 bpm, Respiratory Rate: 20 breaths/min, Blood Pressure: 117/69 mmHg. Integumentary (Hair, Skin) Wound #1 status is Open. Original cause of wound was Pressure Injury. The wound is located on the Left Coccyx. The wound measures 0.8cm length x 1.5cm width x 0.5cm depth;  0.942cm^2 area and 0.471cm^3 volume. There is fat exposed. There is no tunneling or undermining noted. There is a large amount of serous drainage noted. The wound margin is thickened. There is medium (34-66%) pink granulation within the wound bed. There is  a medium (34-66%) amount of necrotic tissue within the wound bed including Eschar and Adherent Slough. The periwound skin appearance exhibited: Maceration, Moist. The periwound skin appearance did not exhibit: Dry/Scaly. Periwound temperature was noted as No Abnormality. Procedures Wound #1 Wound #1 is a Pressure Ulcer located on the Left Coccyx . There was a Skin/Subcutaneous Tissue Debridement ([id number]) debridement with total area of 1.2 sq cm performed by Matthew Caul, MD. with the following instrument(s): Curette to remove Viable and Non-Viable tissue/material including Exudate, Fibrin/Slough, and Subcutaneous after achieving pain control using Lidocaine 4% Topical Solution. A time out was conducted prior to the start of the procedure. A Minimum amount of bleeding was controlled with Pressure. The procedure was tolerated well with a pain level of 0 throughout and a pain level of 0 following the procedure. Post Debridement Measurements: 0.8cm length x 1.5cm width x 0.7cm depth; 0.66cm^3 volume. Post debridement Stage noted as Category/Stage III. Post procedure Diagnosis Wound #1: Same as Pre-Procedure Plan Wound Cleansing: Wound #1 Left Coccyx: Clean wound with Normal Saline. Anesthetic: Wound #1 Left Coccyx: Topical Lidocaine 4% cream applied to wound bed prior to debridement DEANDRE, BRANNAN ([id number]) Skin Barriers/Peri-Wound Care: Wound #1 Left Coccyx: Skin Prep Primary Wound Dressing: Other: - RTD Secondary Dressing: Wound #1 Left Coccyx: Dry Gauze Boardered Foam Dressing Dressing Change Frequency: Wound #1 Left Coccyx: Change dressing every other day. Follow-up Appointments: Wound #1 Left Coccyx: Return  Appointment in 1 week. Off-Loading: Wound #1 Left Coccyx: Turn and reposition every 2 hours Additional Orders / Instructions: Wound #1 Left Coccyx: Increase protein intake. #1 changed from Prisma to [facility] under a foam-based dressing. I would change this every second day instead of daily. COPD get this wound closed or at least down to a small size that Iodosorb might be able to close down. Base of this appears to be healthy there is no evidence of infection Electronic Signature(s) Signed: 09/29/2015 7:56:34 AM By: Matthew Najjar MD Entered By: [name] on 09/28/2015 09:32:10 Matthew Evangelist ([id number]) -------------------------------------------------------------------------------- SuperBill Details Patient Name: Matthew Evangelist Date of Service: 09/28/2015 Medical Record Patient Account Number: [id number] Number: Treating RN: Matthew Haggis 1970/05/26 (44 y.o. Other Clinician: Date of Birth/Sex: Male) Treating Julius Matus Primary Care Physician: Physician/Extender: [name] Referring Physician: Weeks in Frank: 6 Diagnosis Coding ICD-10 Codes Code Description L89.153 Pressure ulcer of sacral region, stage 3 G82.22 Paraplegia, incomplete Facility Procedures CPT4 Code: 86578469 Description: 11042 - DEB SUBQ TISSUE 20 SQ CM/< ICD-10 Description Diagnosis L89.153 Pressure ulcer of sacral region, stage 3 Modifier: Quantity: 1 Physician Procedures CPT4 Code: [id number] Description: 11042 - WC PHYS SUBQ TISS 20 SQ CM ICD-10 Description Diagnosis L89.153 Pressure ulcer of sacral region, stage 3 Modifier: Quantity: 1 Electronic Signature(s) Signed: 09/29/2015 7:56:34 AM By: Matthew Najjar MD Entered By: [name] on 09/28/2015 09:32:31

## 2015-09-30 NOTE — Progress Notes (Signed)
Matthew, Frank ([id number]) Visit Report for 09/28/2015 Arrival Information Details Patient Name: [name], [name] Date of Service: 09/28/2015 8:45 AM Medical Record Number: [id number] Patient Account Number: [id number] Date of Birth/Sex: 06/17/1970 (45 y.o. Male) Treating RN: Phillis Haggis Primary Care Physician: Other Clinician: Referring Physician: Treating Physician/Extender: Altamese [place] in Treatment: 6 Visit Information History Since Last Visit All ordered tests and consults were completed: No Patient Arrived: Wheel Chair Added or deleted any medications: No Arrival Time: 09:03 Any new allergies or adverse reactions: No Accompanied By: self Had a fall or experienced change in No activities of daily living that may affect Transfer Assistance: None risk of falls: Patient Identification Verified: Yes Signs or symptoms of abuse/neglect since last No Secondary Verification Process Yes visito Completed: Hospitalized since last visit: No Patient Requires Transmission-Based No Pain Present Now: No Precautions: Patient Has Alerts: No Electronic Signature(s) Signed: 09/29/2015 5:55:43 PM By: Alejandro Mulling Entered By: Alejandro Mulling on 09/28/2015 09:28:17 [name] ([id number]) -------------------------------------------------------------------------------- Encounter Discharge Information Details Patient Name: [name] Date of Service: 09/28/2015 8:45 AM Medical Record Number: [id number] Patient Account Number: [id number] Date of Birth/Sex: 12/28/1970 (45 y.o. Male) Treating RN: Phillis Haggis Primary Care Physician: Other Clinician: Referring Physician: Treating Physician/Extender: Altamese [place] in Treatment: 6 Encounter Discharge Information Items Discharge Pain Level: 0 Discharge Condition: Stable Ambulatory Status: Wheelchair Discharge Destination: Home Transportation: Private Auto Accompanied By: self Schedule Follow-up Appointment:  Yes Medication Reconciliation completed Yes and provided to Patient/Care Deyana Wnuk: Provided on Clinical Summary of Care: 09/28/2015 Form Type Recipient Paper Patient [facility] Electronic Signature(s) Signed: 09/28/2015 9:36:30 AM By: Gwenlyn Perking Entered By: Gwenlyn Perking on 09/28/2015 09:36:30 [name] ([id number]) -------------------------------------------------------------------------------- Lower Extremity Assessment Details Patient Name: [name] Date of Service: 09/28/2015 8:45 AM Medical Record Number: [id number] Patient Account Number: [id number] Date of Birth/Sex: 03-31-1971 (44 y.o. Male) Treating RN: Phillis Haggis Primary Care Physician: Other Clinician: Referring Physician: Treating Physician/Extender: Maxwell Caul Weeks in Treatment: 6 Electronic Signature(s) Signed: 09/29/2015 5:55:43 PM By: Alejandro Mulling Entered By: Alejandro Mulling on 09/28/2015 09:05:38 Matthew, Frank ([id number]) -------------------------------------------------------------------------------- Multi Wound Chart Details Patient Name: [name] Date of Service: 09/28/2015 8:45 AM Medical Record Number: [id number] Patient Account Number: [id number] Date of Birth/Sex: 1971/03/14 (45 y.o. Male) Treating RN: Phillis Haggis Primary Care Physician: Other Clinician: Referring Physician: Treating Physician/Extender: Maxwell Caul Weeks in Treatment: 6 Vital Signs Height(in): 67 Pulse(bpm): 80 Weight(lbs): 147 Blood Pressure 117/69 (mmHg): Body Mass Index(BMI): 23 Temperature(F): 97.5 Respiratory Rate 20 (breaths/min): Photos: [1:No Photos] [N/A:N/A] Wound Location: [1:Left Coccyx] [N/A:N/A] Wounding Event: [1:Pressure Injury] [N/A:N/A] Primary Etiology: [1:Pressure Ulcer] [N/A:N/A] Comorbid History: [1:Paraplegia] [N/A:N/A] Date Acquired: [1:12/24/2014] [N/A:N/A] Weeks of Treatment: [1:6] [N/A:N/A] Wound Status: [1:Open] [N/A:N/A] Measurements L x W x D 0.8x1.5x0.5  [N/A:N/A] (cm) Area (cm) : [1:0.942] [N/A:N/A] Volume (cm) : [1:0.471] [N/A:N/A] % Reduction in Area: [1:11.10%] [N/A:N/A] % Reduction in Volume: 11.10% [N/A:N/A] Classification: [1:Category/Stage III] [N/A:N/A] Exudate Amount: [1:Large] [N/A:N/A] Exudate Type: [1:Serous] [N/A:N/A] Exudate Color: [1:amber] [N/A:N/A] Foul Odor After [1:Yes] [N/A:N/A] Cleansing: Odor Anticipated Due to No [N/A:N/A] Product Use: Wound Margin: [1:Thickened] [N/A:N/A] Granulation Amount: [1:Medium (34-66%)] [N/A:N/A] Granulation Quality: [1:Pink] [N/A:N/A] Necrotic Amount: [1:Medium (34-66%)] [N/A:N/A] Necrotic Tissue: [1:Eschar, Adherent Slough] [N/A:N/A] Exposed Structures: [1:Fat: Yes Fascia: No] [N/A:N/A] Tendon: No Muscle: No Joint: No Bone: No Epithelialization: None N/A N/A Periwound Skin Texture: No Abnormalities Noted N/A N/A Periwound Skin Maceration: Yes N/A N/A Moisture: Moist: Yes Dry/Scaly: No Periwound Skin Color: No Abnormalities Noted N/A N/A Temperature: No Abnormality N/A  N/A Tenderness on No N/A N/A Palpation: Wound Preparation: Ulcer Cleansing: N/A N/A Rinsed/Irrigated with Saline Topical Anesthetic Applied: Other: lidocaine 4% Treatment Notes Electronic Signature(s) Signed: 09/29/2015 5:55:43 PM By: Alejandro MullingPinkerton, Debra Entered By: Alejandro MullingPinkerton, Debra on 09/28/2015 09:10:30 Matthew EvangelistHARRIS, Rocklin ([id number]) -------------------------------------------------------------------------------- Multi-Disciplinary Care Plan Details Patient Name: Matthew EvangelistHARRIS, Matthew Date of Service: 09/28/2015 8:45 AM Medical Record Number: [id number] Patient Account Number: [id number] Date of Birth/Sex: [birth year] (44 y.o. Male) Treating RN: Phillis HaggisPinkerton, Debi Primary Care Physician: Other Clinician: Referring Physician: Treating Physician/Extender: Altamese CarolinaOBSON, MICHAEL G Weeks in Treatment: 6 Active Inactive Abuse / Safety / Falls / Self Care Management Nursing Diagnoses: Potential for  falls Goals: Patient will remain injury free Date Initiated: 08/17/2015 Goal Status: Active Interventions: Assess fall risk on admission and as needed Notes: Nutrition Nursing Diagnoses: Imbalanced nutrition Goals: Patient/caregiver agrees to and verbalizes understanding of need to use nutritional supplements and/or vitamins as prescribed Date Initiated: 08/17/2015 Goal Status: Active Interventions: Assess patient nutrition upon admission and as needed per policy Notes: Orientation to the Wound Care Program Nursing Diagnoses: Knowledge deficit related to the wound healing center program Goals: Patient/caregiver will verbalize understanding of the Wound Healing Center Program Matthew EvangelistHARRIS, Skanda ([id number]) Date Initiated: 08/17/2015 Goal Status: Active Interventions: Provide education on orientation to the wound center Notes: Pressure Nursing Diagnoses: Knowledge deficit related to causes and risk factors for pressure ulcer development Goals: Patient will remain free from development of additional pressure ulcers Date Initiated: 08/17/2015 Goal Status: Active Interventions: Assess: immobility, friction, shearing, incontinence upon admission and as needed Notes: Wound/Skin Impairment Nursing Diagnoses: Impaired tissue integrity Goals: Ulcer/skin breakdown will have a volume reduction of 30% by week 4 Date Initiated: 08/17/2015 Goal Status: Active Ulcer/skin breakdown will have a volume reduction of 50% by week 8 Date Initiated: 08/17/2015 Goal Status: Active Ulcer/skin breakdown will have a volume reduction of 80% by week 12 Date Initiated: 08/17/2015 Goal Status: Active Interventions: Assess patient/caregiver ability to obtain necessary supplies Assess ulceration(s) every visit Notes: Electronic Signature(s) Signed: 09/29/2015 5:55:43 PM By: Servando SnarePinkerton, Debra Shadrick, Leonette MostHARLES ([id number]) Entered By: Alejandro MullingPinkerton, Debra on 09/28/2015 09:10:22 Matthew EvangelistHARRIS, Meiko  ([id number]) -------------------------------------------------------------------------------- Pain Assessment Details Patient Name: Matthew EvangelistHARRIS, Trajon Date of Service: 09/28/2015 8:45 AM Medical Record Number: [id number] Patient Account Number: [id number] Date of Birth/Sex: [birth year] [age] y.o. Male) Treating RN: Phillis HaggisPinkerton, Debi Primary Care Physician: Other Clinician: Referring Physician: Treating Physician/Extender: Maxwell CaulOBSON, MICHAEL G Weeks in Treatment: 6 Active Problems Location of Pain Severity and Description of Pain Patient Has Paino No Site Locations Pain Management and Medication Current Pain Management: Electronic Signature(s) Signed: 09/29/2015 5:55:43 PM By: Alejandro MullingPinkerton, Debra Entered By: Alejandro MullingPinkerton, Debra on 09/28/2015 09:03:44 Matthew EvangelistHARRIS, Burhan ([id number]) -------------------------------------------------------------------------------- Patient/Caregiver Education Details Patient Name: Matthew EvangelistHARRIS, Estephan Date of Service: 09/28/2015 8:45 AM Medical Record Number: [id number] Patient Account Number: [id number] Date of Birth/Gender: [birth year] [age] y.o. Male) Treating RN: Phillis HaggisPinkerton, Debi Primary Care Physician: Other Clinician: Referring Physician: Treating Physician/Extender: Altamese CarolinaOBSON, MICHAEL G Weeks in Treatment: 6 Education Assessment Education Provided To: Patient Education Topics Provided Wound/Skin Impairment: Handouts: Other: change dressing as ordered Methods: Demonstration, Explain/Verbal Responses: State content correctly Electronic Signature(s) Signed: 09/29/2015 5:55:43 PM By: Alejandro MullingPinkerton, Debra Entered By: Alejandro MullingPinkerton, Debra on 09/28/2015 09:13:01 Matthew EvangelistHARRIS, Reyaansh ([id number]) -------------------------------------------------------------------------------- Wound Assessment Details Patient Name: Matthew EvangelistHARRIS, Filiberto Date of Service: 09/28/2015 8:45 AM Medical Record Number: [id number] Patient Account Number: [id number] Date of Birth/Sex: [birth year] [age] y.o. Male) Treating RN:  Phillis HaggisPinkerton, Debi Primary Care Physician: Other Clinician: Referring Physician: Treating Physician/Extender: Maxwell CaulOBSON, MICHAEL G Weeks in Treatment: 6 Wound Status Wound Number: 1 Primary  Etiology: Pressure Ulcer Wound Location: Left Coccyx Wound Status: Open Wounding Event: Pressure Injury Comorbid History: Paraplegia Date Acquired: 12/24/2014 Weeks Of Treatment: 6 Clustered Wound: No Photos Photo Uploaded By: Alejandro Mulling on 09/29/2015 07:58:01 Wound Measurements Length: (cm) 0.8 Width: (cm) 1.5 Depth: (cm) 0.5 Area: (cm) 0.942 Volume: (cm) 0.471 % Reduction in Area: 11.1% % Reduction in Volume: 11.1% Epithelialization: None Tunneling: No Undermining: No Wound Description Classification: Category/Stage III Wound Margin: Thickened Exudate Amount: Large Exudate Type: Serous Exudate Color: amber Foul Odor After Cleansing: Yes Due to Product Use: No Wound Bed Granulation Amount: Medium (34-66%) Exposed Structure Granulation Quality: Pink Fascia Exposed: No Necrotic Amount: Medium (34-66%) Fat Layer Exposed: Yes Necrotic Quality: Eschar, Adherent Slough Tendon Exposed: No MASIAH, WOODY ([id number]) Muscle Exposed: No Joint Exposed: No Bone Exposed: No Periwound Skin Texture Texture Color No Abnormalities Noted: No No Abnormalities Noted: No Moisture Temperature / Pain No Abnormalities Noted: No Temperature: No Abnormality Dry / Scaly: No Maceration: Yes Moist: Yes Wound Preparation Ulcer Cleansing: Rinsed/Irrigated with Saline Topical Anesthetic Applied: Other: lidocaine 4%, Treatment Notes Wound #1 (Left Coccyx) 1. Cleansed with: Clean wound with Normal Saline 2. Anesthetic Topical Lidocaine 4% cream to wound bed prior to debridement 3. Peri-wound Care: Skin Prep 4. Dressing Applied: Other dressing (specify in notes) 5. Secondary Dressing Applied Bordered Foam Dressing Notes RTD Electronic Signature(s) Signed: 09/29/2015 5:55:43 PM By:  Alejandro Mulling Entered By: Alejandro Mulling on 09/28/2015 09:10:14 [name] ([id number]) -------------------------------------------------------------------------------- Vitals Details Patient Name: [name] Date of Service: 09/28/2015 8:45 AM Medical Record Number: [id number] Patient Account Number: [id number] Date of Birth/Sex: 1970-09-09 (45 y.o. Male) Treating RN: Phillis Haggis Primary Care Physician: Other Clinician: Referring Physician: Treating Physician/Extender: Altamese [place] in Treatment: 6 Vital Signs Time Taken: 09:03 Temperature (F): 97.5 Height (in): 67 Pulse (bpm): 80 Weight (lbs): 147 Respiratory Rate (breaths/min): 20 Body Mass Index (BMI): 23 Blood Pressure (mmHg): 117/69 Reference Range: 80 - 120 mg / dl Electronic Signature(s) Signed: 09/29/2015 5:55:43 PM By: Alejandro Mulling Entered By: Alejandro Mulling on 09/28/2015 09:05:32

## 2015-10-05 ENCOUNTER — Ambulatory Visit: Payer: PRIVATE HEALTH INSURANCE | Admitting: Internal Medicine

## 2015-10-12 ENCOUNTER — Encounter (HOSPITAL_BASED_OUTPATIENT_CLINIC_OR_DEPARTMENT_OTHER): Payer: Worker's Compensation | Admitting: General Surgery

## 2015-10-12 DIAGNOSIS — S31000A Unspecified open wound of lower back and pelvis without penetration into retroperitoneum, initial encounter: Secondary | ICD-10-CM | POA: Insufficient documentation

## 2015-10-12 DIAGNOSIS — L89153 Pressure ulcer of sacral region, stage 3: Secondary | ICD-10-CM | POA: Diagnosis not present

## 2015-10-12 DIAGNOSIS — L89210 Pressure ulcer of right hip, unstageable: Secondary | ICD-10-CM

## 2015-10-12 NOTE — Progress Notes (Signed)
Pressure ulcer RX"ed  With offloading and alginate

## 2015-10-13 NOTE — Progress Notes (Signed)
JEREMAIH, KLIMA ([id number]) Visit Report for 10/12/2015 Chief Complaint Document Details Patient Name: [name], [name] Date of Service: 10/12/2015 2:15 PM Medical Record Number: [id number] Patient Account Number: [id number] Date of Birth/Sex: 1970-11-08 (45 y.o. Male) Treating RN: Phillis Haggis Primary Care Physician: Other Clinician: Referring Physician: Treating Physician/Extender: Elayne Snare in Treatment: 8 Information Obtained from: Patient Chief Complaint 08/17/15; the patient is recently really relocated from [facility]. He is here to establish a wound care for a stage III ulcer on his lower sacral area. Electronic Signature(s) Signed: 10/12/2015 2:56:07 PM By: Ardath Sax MD Entered By: Ardath Sax on 10/12/2015 14:56:06 [name] ([id number]) -------------------------------------------------------------------------------- Debridement Details Patient Name: [name] Date of Service: 10/12/2015 2:15 PM Medical Record Number: [id number] Patient Account Number: [id number] Date of Birth/Sex: 1970-06-05 (45 y.o. Male) Treating RN: Phillis Haggis Primary Care Physician: Other Clinician: Referring Physician: Treating Physician/Extender: Elayne Snare in Treatment: 8 Debridement Performed for Wound #1 Left Coccyx Assessment: Performed By: Physician Ardath Sax, MD Debridement: Debridement Pre-procedure Yes Verification/Time Out Taken: Start Time: 14:39 Pain Control: Lidocaine 4% Topical Solution Level: Skin/Subcutaneous Tissue Total Area Debrided (L x 0.3 (cm) x 1 (cm) = 0.3 (cm) W): Tissue and other Viable, Non-Viable, Exudate, Fibrin/Slough, Subcutaneous material debrided: Instrument: Scissors Bleeding: Minimum Hemostasis Achieved: Pressure End Time: 14:44 Procedural Pain: 0 Post Procedural Pain: 0 Response to Treatment: Procedure was tolerated well Post Debridement Measurements of Total Wound Length: (cm) 0.3 Stage:  Category/Stage III Width: (cm) 1 Depth: (cm) 0.5 Volume: (cm) 0.118 Post Procedure Diagnosis Same as Pre-procedure Electronic Signature(s) Signed: 10/12/2015 3:04:31 PM By: Ardath Sax MD Signed: 10/12/2015 4:44:15 PM By: Alejandro Mulling Entered By: Alejandro Mulling on 10/12/2015 14:46:39 [name] ([id number]) -------------------------------------------------------------------------------- HPI Details Patient Name: [name] Date of Service: 10/12/2015 2:15 PM Medical Record Number: [id number] Patient Account Number: [id number] Date of Birth/Sex: 1971-03-25 (45 y.o. 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 He was diagnosed with what sounds like a stage III pressure ulcer in September 2016 although this may have been present for some time longer than that perhaps July 2016. He was followed by wound care in [place]. By his description there were using Silvadene cream for a period of time and then 3 months ago change to Aquacel Ag which she is continued and changes daily with help from his mother. He has recently relocated to [facility] He went to urgent care to follow-up on his wound on 08/04/2015 they did a culture of this wound that showed Pseudomonas and MSSA. He was placed on an antibiotic but doesn't remember which one it is. He has continued using Aquacel Ag and a foam based cover. I don't have any of his records from the wound care center in [place] although the patient states he did have an MRI that did not show osteomyelitis. He is not aware of having a bone culture done and doesn't think that he had exposed bone on this and any point. The patient does  in and out cath's and has no urinary incontinence issues. He is on appropriate bowel regimen which she administers suppositories. He is able to position himself appropriately and is wheelchair to try and avoid pressure over this wound 08/24/15; small horizontal wound over his lower sacral area. Requires  debridement of surface slough and nonviable subcutaneous tissue. There is a small amount of undermining noted no evidence of infection. On the right side of this wound there is some senescent rolled edges which may need debridement I did not do this today. 09/01/15 this is a small wound over the lower sacral area. It has tunneling superiorly. This goes roughly 1 cm. Nonviable subcutaneous tissue at the inferior aspect of the wound base. No real evidence of infection 09/07/15; small horizontal wound over the lower sacral area. Last week it had 1 cm of superior tunneling which appears to of resolved. No evidence of infection 09/21/15; continues to have a small horizontal wound over the lower sacral area. We have been using Prisma and this has contracted. He tells me that his mother is aware medication not able to change the dressing although he states that he is able to do this 09/28/15; a small horizontal wound over his lower sacral area is all that is left of this wound which was probably much larger and deeper at one point. Depth today at 0.7 cm. Base of this had some nonviable subcutaneous tissue that I removed. Appears to be stalling on current dressing which is Radiographer, therapeuticrisma Electronic Signature(s) Signed: 10/12/2015 2:56:32 PM By: Ardath SaxParker, Riniyah Speich MD Entered By: Ardath SaxParker, Nicklas Mcsweeney on 10/12/2015 14:56:31 [name], Denim ([id number]) -------------------------------------------------------------------------------- Physical Exam Details Patient Name: [name], Chevez Date of Service: 10/12/2015 2:15 PM Medical Record Number: [id number] Patient Account Number: [id number] Date of Birth/Sex: 10/18/1970 21(45 y.o.  Male) Treating RN: Phillis HaggisPinkerton, Debi Primary Care Physician: Other Clinician: Referring Physician: Treating Physician/Extender: Elayne SnarePARKER, Shalaunda Weatherholtz Weeks in Treatment: 8 Electronic Signature(s) Signed: 10/12/2015 2:56:39 PM By: Ardath SaxParker, Lyncoln Ledgerwood MD Entered By: Ardath SaxParker, Jenah Vanasten on 10/12/2015 14:56:39 [name], Eitan ([id number]) -------------------------------------------------------------------------------- Physician Orders Details Patient Name: [name], Jovahn Date of Service: 10/12/2015 2:15 PM Medical Record Number: [id number] Patient Account Number: [id number] Date of Birth/Sex: 10/18/1970 16(45 y.o. Male) Treating RN: Phillis HaggisPinkerton, Debi Primary Care Physician: Other Clinician: Referring Physician: Treating Physician/Extender: Elayne SnarePARKER, Azavion Bouillon Weeks in Treatment: 8 Verbal / Phone Orders: Yes ClinicianAshok Cordia: Pinkerton, Debi Read Back and Verified: Yes Diagnosis Coding Wound Cleansing Wound #1 Left Coccyx o Clean wound with Normal Saline. Anesthetic Wound #1 Left Coccyx o Topical Lidocaine 4% cream applied to wound bed prior to debridement Skin Barriers/Peri-Wound Care Wound #1 Left Coccyx o Skin Prep Primary Wound Dressing o Prisma Ag - moisten with normal saline or ky jelly Secondary Dressing Wound #1 Left Coccyx o Dry Gauze o Boardered Foam Dressing Dressing Change Frequency Wound #1 Left Coccyx o Change dressing every other day. - unless there is a lot of drainage Follow-up Appointments Wound #1 Left Coccyx o Return Appointment in 1 week. Off-Loading Wound #1 Left Coccyx o Turn and reposition every 2 hours Additional Orders / Instructions Wound #1 Left Coccyx o Increase protein intake. [name], Dollie ([id number]) Electronic Signature(s) Signed: 10/12/2015 3:04:31 PM By: Ardath SaxParker, Malori Myers MD Signed: 10/12/2015 4:44:15 PM By: Alejandro MullingPinkerton, Debra Entered By: Alejandro MullingPinkerton, Debra on 10/12/2015 14:47:08 [name], Trase  ([id number]) -------------------------------------------------------------------------------- Problem List Details Patient Name: [name], Malakye Date of Service: 10/12/2015 2:15 PM Medical Record Number: [id number] Patient Account Number: [id number] Date of Birth/Sex: 10/18/1970 28(45 y.o. Male) Treating RN: Phillis HaggisPinkerton, Debi Primary Care Physician: Other Clinician: Referring Physician: Treating Physician/Extender: Elayne SnarePARKER, Mirela Parsley Weeks in Treatment: 8 Active Problems ICD-10 Encounter Code Description Active Date Diagnosis L89.153 Pressure ulcer of sacral region, stage 3 08/17/2015 Yes G82.22 Paraplegia, incomplete 08/17/2015 Yes Inactive Problems Resolved Problems Electronic Signature(s) Signed: 10/12/2015 2:55:40 PM By: Ardath SaxParker, Maddon Horton MD Entered  By: Ardath Sax on 10/12/2015 14:55:40 [name] ([id number]) -------------------------------------------------------------------------------- Progress Note Details Patient Name: LONZA, SHIMABUKURO Date of Service: 10/12/2015 2:15 PM Medical Record Number: [id number] Patient Account Number: [id number] Date of Birth/Sex: 01/17/1971 (45 y.o. Male) Treating RN: Phillis Haggis Primary Care Physician: Other Clinician: Referring Physician: Treating Physician/Extender: Elayne Snare in Treatment: 8 Subjective Chief Complaint Information obtained from Patient 08/17/15; the patient is recently really relocated from [facility]. He is here to establish a wound care for a stage III ulcer on his lower sacral area. 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 He was diagnosed with what sounds like a stage III pressure ulcer in September 2016 although this may have been present for some time longer than that perhaps July 2016. He was followed by wound care in [place]. By his description there were using Silvadene cream  for a period of time and then 3 months ago change to Aquacel Ag which she is continued and changes daily with help from his mother. He has recently relocated to [facility]. He went to urgent care to follow- up on his wound on 08/04/2015 they did a culture of this wound that showed Pseudomonas and MSSA. He was placed on an antibiotic but doesn't remember which one it is. He has continued using Aquacel Ag and a foam based cover. I don't have any of his records from the wound care center in [place] although the patient states he did have an MRI that did not show osteomyelitis. He is not aware of having a bone culture done and doesn't think that he had exposed bone on this and any point. The patient does in and out cath's and has no urinary incontinence issues. He is on appropriate bowel regimen which she administers suppositories. He is able to position himself appropriately and is wheelchair to try and avoid pressure over this wound 08/24/15; small horizontal wound over his lower sacral area. Requires debridement of surface slough and nonviable subcutaneous tissue. There is a small amount of undermining noted no evidence of infection. On the right side of this wound there is some senescent rolled edges which may need debridement I did not do this today. 09/01/15 this is a small wound over the lower sacral area. It has tunneling superiorly. This goes roughly 1 cm. Nonviable subcutaneous tissue at the inferior aspect of the wound base. No real evidence of infection 09/07/15; small horizontal wound over the lower sacral area. Last week it had 1 cm of superior tunneling which appears to of resolved. No evidence of infection 09/21/15; continues to have a small horizontal wound over the lower sacral area. We have been using Prisma and this has contracted. He tells me that his mother is aware medication not able to change the dressing although he states that he is able to do this 09/28/15; a  small horizontal wound over his lower sacral area is all that is left of this wound which was probably much larger and deeper at one point. Depth today at 0.7 cm. Base of this had some nonviable subcutaneous tissue that I removed. Appears to be stalling on current dressing which is Zaivion Kundrat, Leonette Most ([id number]) Objective Constitutional Vitals Time Taken: 2:24 PM, Height: 67 in, Weight: 147 lbs, BMI: 23, Temperature: 98.1 F, Pulse: 79 bpm, Respiratory Rate: 20 breaths/min, Blood Pressure: 105/64 mmHg. Integumentary (Hair, Skin) Wound #1 status is  Open. Original cause of wound was Pressure Injury. The wound is located on the Left Coccyx. The wound measures 0.3cm length x 1cm width x 0.6cm depth; 0.236cm^2 area and 0.141cm^3 volume. There is fat exposed. There is no tunneling or undermining noted. There is a large amount of serous drainage noted. The wound margin is thickened. There is medium (34-66%) pink granulation within the wound bed. There is a medium (34-66%) amount of necrotic tissue within the wound bed including Eschar and Adherent Slough. The periwound skin appearance exhibited: Maceration, Moist. The periwound skin appearance did not exhibit: Dry/Scaly. Periwound temperature was noted as No Abnormality. Assessment Active Problems ICD-10 L89.153 - Pressure ulcer of sacral region, stage 3 G82.22 - Paraplegia, incomplete Procedures Wound #1 Wound #1 is a Pressure Ulcer located on the Left Coccyx . There was a Skin/Subcutaneous Tissue Debridement ([id number]) debridement with total area of 0.3 sq cm performed by Ardath Sax, MD. with the following instrument(s): Scissors to remove Viable and Non-Viable tissue/material including Exudate, Fibrin/Slough, and Subcutaneous after achieving pain control using Lidocaine 4% Topical Solution. A time out was conducted prior to the start of the procedure. A Minimum amount of bleeding was controlled with Pressure. The procedure was  tolerated well with a pain level of 0 throughout and a pain level of 0 following the procedure. Post Debridement Measurements: 0.3cm length x 1cm width x 0.5cm depth; 0.118cm^3 volume. Post debridement Stage noted as Category/Stage III. Post procedure Diagnosis Wound #1: Same as Pre-Procedure TIMMY, BUBECK ([id number]) debrided hypertrophic skin. wound only about 2 cm and is very clean. Stage 2 o3 pressure wound Treat with prisma daily Plan Wound Cleansing: Wound #1 Left Coccyx: Clean wound with Normal Saline. Anesthetic: Wound #1 Left Coccyx: Topical Lidocaine 4% cream applied to wound bed prior to debridement Skin Barriers/Peri-Wound Care: Wound #1 Left Coccyx: Skin Prep Primary Wound Dressing: Prisma Ag - moisten with normal saline or ky jelly Secondary Dressing: Wound #1 Left Coccyx: Dry Gauze Boardered Foam Dressing Dressing Change Frequency: Wound #1 Left Coccyx: Change dressing every other day. - unless there is a lot of drainage Follow-up Appointments: Wound #1 Left Coccyx: Return Appointment in 1 week. Off-Loading: Wound #1 Left Coccyx: Turn and reposition every 2 hours Additional Orders / Instructions: Wound #1 Left Coccyx: Increase protein intake. Follow-Up Appointments: A follow-up appointment should be scheduled. Medication Reconciliation completed and provided to Patient/Care Provider. A Patient Clinical Summary of Care was provided to [facility] [place] ([id number]) Electronic Signature(s) Signed: 10/12/2015 2:59:16 PM By: Ardath Sax MD Entered By: Ardath Sax on 10/12/2015 14:59:16 JAHMIER, WILLADSEN ([id number]) -------------------------------------------------------------------------------- SuperBill Details Patient Name: [name] Date of Service: 10/12/2015 Medical Record Number: [id number] Patient Account Number: [id number] Date of Birth/Sex: 12-21-1970 (45 y.o. Male) Treating RN: Phillis Haggis Primary Care Physician: Other  Clinician: Referring Physician: Treating Physician/Extender: Elayne Snare in Treatment: 8 Diagnosis Coding ICD-10 Codes Code Description L89.153 Pressure ulcer of sacral region, stage 3 G82.22 Paraplegia, incomplete Facility Procedures CPT4 Code: [id number] Description: 11042 - DEB SUBQ TISSUE 20 SQ CM/< ICD-10 Description Diagnosis G82.22 Paraplegia, incomplete Modifier: Quantity: 1 Physician Procedures CPT4 Code: 3710626 Description: 99213 - WC PHYS LEVEL 3 - EST PT ICD-10 Description Diagnosis L89.153 Pressure ulcer of sacral region, stage 3 G82.22 Paraplegia, incomplete Modifier: Quantity: 1 CPT4 Code: [id number] Description: 11042 - WC PHYS SUBQ TISS 20 SQ CM ICD-10 Description Diagnosis G82.22 Paraplegia, incomplete Modifier: Quantity: 1 Electronic Signature(s) Signed: 10/12/2015 3:00:00 PM By: Ardath Sax MD Entered By: Ardath Sax on 10/12/2015 15:00:00

## 2015-10-13 NOTE — Progress Notes (Signed)
[name], Makaveli ([id number]) Visit Report for 10/12/2015 Arrival Information Details Patient Name: [name], [name] Date of Service: 10/12/2015 2:15 PM Medical Record Number: [id number] Patient Account Number: [id number] Date of Birth/Sex: 01/16/1971 1(45 y.o. Male) Treating RN: Phillis HaggisPinkerton, Debi Primary Care Physician: Other Clinician: Referring Physician: Treating Physician/Extender: Altamese CarolinaOBSON, MICHAEL G Weeks in Treatment: 8 Visit Information History Since Last Visit All ordered tests and consults were completed: No Patient Arrived: Wheel Chair Added or deleted any medications: No Arrival Time: 14:22 Any new allergies or adverse reactions: No Accompanied By: self Had a fall or experienced change in No activities of daily living that may affect Transfer Assistance: None risk of falls: Patient Identification Verified: Yes Signs or symptoms of abuse/neglect since last No Secondary Verification Process Yes visito Completed: Hospitalized since last visit: No Patient Requires Transmission-Based No Pain Present Now: No Precautions: Patient Has Alerts: No Electronic Signature(s) Signed: 10/12/2015 4:44:15 PM By: Alejandro MullingPinkerton, Debra Entered By: Alejandro MullingPinkerton, Debra on 10/12/2015 14:22:40 [name], Dewan ([id number]) -------------------------------------------------------------------------------- Encounter Discharge Information Details Patient Name: [name], Keion Date of Service: 10/12/2015 2:15 PM Medical Record Number: [id number] Patient Account Number: [id number] Date of Birth/Sex: 01/16/1971 [age] y.o. Male) Treating RN: Phillis HaggisPinkerton, Debi Primary Care Physician: Other Clinician: Referring Physician: Treating Physician/Extender: Elayne SnarePARKER, PETER Weeks in Treatment: 8 Encounter Discharge Information Items Discharge Pain Level: 0 Discharge Condition: Stable Ambulatory Status: Wheelchair Discharge Destination: Home Transportation: Private Auto Accompanied By: self Schedule Follow-up Appointment:  Yes Medication Reconciliation completed and provided to Patient/Care Yes Cassundra Mckeever: Provided on Clinical Summary of Care: 10/12/2015 Form Type Recipient Paper [facility] Electronic Signature(s) Signed: 10/12/2015 3:00:26 PM By: Ardath SaxParker, Peter MD Previous Signature: 10/12/2015 2:53:16 PM Version By: Gwenlyn PerkingMoore, Shelia Entered By: Ardath SaxParker, Peter on 10/12/2015 15:00:26 [name], Tomi ([id number]) -------------------------------------------------------------------------------- Lower Extremity Assessment Details Patient Name: [name], Green Date of Service: 10/12/2015 2:15 PM Medical Record Number: [id number] Patient Account Number: [id number] Date of Birth/Sex: 01/16/1971 [age] y.o. Male) Treating RN: Phillis HaggisPinkerton, Debi Primary Care Physician: Other Clinician: Referring Physician: Treating Physician/Extender: Maxwell CaulOBSON, MICHAEL G Weeks in Treatment: 8 Electronic Signature(s) Signed: 10/12/2015 4:44:15 PM By: Alejandro MullingPinkerton, Debra Entered By: Alejandro MullingPinkerton, Debra on 10/12/2015 14:25:23 [name], Ichael ([id number]) -------------------------------------------------------------------------------- Multi Wound Chart Details Patient Name: [name], Jacob Date of Service: 10/12/2015 2:15 PM Medical Record Number: [id number] Patient Account Number: [id number] Date of Birth/Sex: 01/16/1971 [age] y.o. Male) Treating RN: Phillis HaggisPinkerton, Debi Primary Care Physician: Other Clinician: Referring Physician: Treating Physician/Extender: Maxwell CaulOBSON, MICHAEL G Weeks in Treatment: 8 Vital Signs Height(in): 67 Pulse(bpm): 79 Weight(lbs): 147 Blood Pressure 105/64 (mmHg): Body Mass Index(BMI): 23 Temperature(F): 98.1 Respiratory Rate 20 (breaths/min): Photos: [1:No Photos] [N/A:N/A] Wound Location: [1:Left Coccyx] [N/A:N/A] Wounding Event: [1:Pressure Injury] [N/A:N/A] Primary Etiology: [1:Pressure Ulcer] [N/A:N/A] Comorbid History: [1:Paraplegia] [N/A:N/A] Date Acquired: [1:12/24/2014] [N/A:N/A] Weeks of Treatment: [1:8]  [N/A:N/A] Wound Status: [1:Open] [N/A:N/A] Measurements L x W x D 0.3x1x0.6 [N/A:N/A] (cm) Area (cm) : [1:0.236] [N/A:N/A] Volume (cm) : [1:0.141] [N/A:N/A] % Reduction in Area: [1:77.70%] [N/A:N/A] % Reduction in Volume: 73.40% [N/A:N/A] Classification: [1:Category/Stage III] [N/A:N/A] Exudate Amount: [1:Large] [N/A:N/A] Exudate Type: [1:Serous] [N/A:N/A] Exudate Color: [1:amber] [N/A:N/A] Foul Odor After [1:Yes] [N/A:N/A] Cleansing: Odor Anticipated Due to No [N/A:N/A] Product Use: Wound Margin: [1:Thickened] [N/A:N/A] Granulation Amount: [1:Medium (34-66%)] [N/A:N/A] Granulation Quality: [1:Pink] [N/A:N/A] Necrotic Amount: [1:Medium (34-66%)] [N/A:N/A] Necrotic Tissue: [1:Eschar, Adherent Slough] [N/A:N/A] Exposed Structures: [1:Fat: Yes Fascia: No] [N/A:N/A] Tendon: No Muscle: No Joint: No Bone: No Epithelialization: None N/A N/A Periwound Skin Texture: No Abnormalities Noted N/A N/A Periwound Skin Maceration: Yes N/A N/A Moisture: Moist: Yes Dry/Scaly: No Periwound Skin Color:  No Abnormalities Noted N/A N/A Temperature: No Abnormality N/A N/A Tenderness on No N/A N/A Palpation: Wound Preparation: Ulcer Cleansing: N/A N/A Rinsed/Irrigated with Saline Topical Anesthetic Applied: Other: lidocaine 4% Treatment Notes Electronic Signature(s) Signed: 10/12/2015 4:44:15 PM By: Alejandro Mulling Entered By: Alejandro Mulling on 10/12/2015 14:31:33 Matthew Evangelist ([id number]) -------------------------------------------------------------------------------- Multi-Disciplinary Care Plan Details Patient Name: Matthew Evangelist Date of Service: 10/12/2015 2:15 PM Medical Record Number: [id number] Patient Account Number: [id number] Date of Birth/Sex: 07-24-1970 (45 y.o. Male) Treating RN: Phillis Haggis Primary Care Physician: Other Clinician: Referring Physician: Treating Physician/Extender: Altamese [place] in Treatment: 8 Active Inactive Abuse / Safety /  Falls / Self Care Management Nursing Diagnoses: Potential for falls Goals: Patient will remain injury free Date Initiated: 08/17/2015 Goal Status: Active Interventions: Assess fall risk on admission and as needed Notes: Nutrition Nursing Diagnoses: Imbalanced nutrition Goals: Patient/caregiver agrees to and verbalizes understanding of need to use nutritional supplements and/or vitamins as prescribed Date Initiated: 08/17/2015 Goal Status: Active Interventions: Assess patient nutrition upon admission and as needed per policy Notes: Orientation to the Wound Care Program Nursing Diagnoses: Knowledge deficit related to the wound healing center program Goals: Patient/caregiver will verbalize understanding of the Wound Healing Center Program STANLY, SI ([id number]) Date Initiated: 08/17/2015 Goal Status: Active Interventions: Provide education on orientation to the wound center Notes: Pressure Nursing Diagnoses: Knowledge deficit related to causes and risk factors for pressure ulcer development Goals: Patient will remain free from development of additional pressure ulcers Date Initiated: 08/17/2015 Goal Status: Active Interventions: Assess: immobility, friction, shearing, incontinence upon admission and as needed Notes: Wound/Skin Impairment Nursing Diagnoses: Impaired tissue integrity Goals: Ulcer/skin breakdown will have a volume reduction of 30% by week 4 Date Initiated: 08/17/2015 Goal Status: Active Ulcer/skin breakdown will have a volume reduction of 50% by week 8 Date Initiated: 08/17/2015 Goal Status: Active Ulcer/skin breakdown will have a volume reduction of 80% by week 12 Date Initiated: 08/17/2015 Goal Status: Active Interventions: Assess patient/caregiver ability to obtain necessary supplies Assess ulceration(s) every visit Notes: Electronic Signature(s) Signed: 10/12/2015 4:44:15 PM By: Servando Snare, Leonette Most ([id number]) Entered By:  Alejandro Mulling on 10/12/2015 14:31:26 Matthew Evangelist ([id number]) -------------------------------------------------------------------------------- Pain Assessment Details Patient Name: Matthew Evangelist Date of Service: 10/12/2015 2:15 PM Medical Record Number: [id number] Patient Account Number: [id number] Date of Birth/Sex: December 20, 1970 (45 y.o. Male) Treating RN: Phillis Haggis Primary Care Physician: Other Clinician: Referring Physician: Treating Physician/Extender: Maxwell Caul Weeks in Treatment: 8 Active Problems Location of Pain Severity and Description of Pain Patient Has Paino No Site Locations With Dressing Change: No Pain Management and Medication Current Pain Management: Electronic Signature(s) Signed: 10/12/2015 4:44:15 PM By: Alejandro Mulling Entered By: Alejandro Mulling on 10/12/2015 14:22:47 Matthew Evangelist ([id number]) -------------------------------------------------------------------------------- Patient/Caregiver Education Details Patient Name: Matthew Evangelist Date of Service: 10/12/2015 2:15 PM Medical Record Number: [id number] Patient Account Number: [id number] Date of Birth/Gender: [birth year] (45 y.o. Male) Treating RN: Phillis Haggis Primary Care Physician: Other Clinician: Referring Physician: Treating Physician/Extender: Elayne Snare in Treatment: 8 Education Assessment Education Provided To: Patient Education Topics Provided Wound/Skin Impairment: Handouts: Other: change dressing as ordered Methods: Demonstration, Explain/Verbal Responses: State content correctly Electronic Signature(s) Signed: 10/12/2015 3:00:35 PM By: Ardath Sax MD Entered By: Ardath Sax on 10/12/2015 15:00:34 Matthew Evangelist ([id number]) -------------------------------------------------------------------------------- Wound Assessment Details Patient Name: Matthew Evangelist Date of Service: 10/12/2015 2:15 PM Medical Record Number: [id number] Patient Account  Number: [id number] Date of Birth/Sex: 19-Mar-1971 (45 y.o. Male) Treating RN: Phillis Haggis Primary Care Physician: Other Clinician: Referring Physician: Treating Physician/Extender:  ROBSON, MICHAEL G Weeks in Treatment: 8 Wound Status Wound Number: 1 Primary Etiology: Pressure Ulcer Wound Location: Left Coccyx Wound Status: Open Wounding Event: Pressure Injury Comorbid History: Paraplegia Date Acquired: 12/24/2014 Weeks Of Treatment: 8 Clustered Wound: No Photos Photo Uploaded By: Alejandro Mulling on 10/12/2015 16:38:37 Wound Measurements Length: (cm) 0.3 Width: (cm) 1 Depth: (cm) 0.6 Area: (cm) 0.236 Volume: (cm) 0.141 % Reduction in Area: 77.7% % Reduction in Volume: 73.4% Epithelialization: None Tunneling: No Undermining: No Wound Description Classification: Category/Stage III Wound Margin: Thickened Exudate Amount: Large Exudate Type: Serous Exudate Color: amber Foul Odor After Cleansing: Yes Due to Product Use: No Wound Bed Granulation Amount: Medium (34-66%) Exposed Structure Granulation Quality: Pink Fascia Exposed: No Necrotic Amount: Medium (34-66%) Fat Layer Exposed: Yes Necrotic Quality: Eschar, Adherent Slough Tendon Exposed: No [name], [name] ([id number]) Muscle Exposed: No Joint Exposed: No Bone Exposed: No Periwound Skin Texture Texture Color No Abnormalities Noted: No No Abnormalities Noted: No Moisture Temperature / Pain No Abnormalities Noted: No Temperature: No Abnormality Dry / Scaly: No Maceration: Yes Moist: Yes Wound Preparation Ulcer Cleansing: Rinsed/Irrigated with Saline Topical Anesthetic Applied: Other: lidocaine 4%, Treatment Notes Wound #1 (Left Coccyx) 1. Cleansed with: Clean wound with Normal Saline 2. Anesthetic Topical Lidocaine 4% cream to wound bed prior to debridement 4. Dressing Applied: Prisma Ag 5. Secondary Dressing Applied Bordered Foam Dressing Notes RTD Electronic Signature(s) Signed: 10/12/2015  4:44:15 PM By: Alejandro Mulling Entered By: Alejandro Mulling on 10/12/2015 14:31:14 Matthew Evangelist ([id number]) -------------------------------------------------------------------------------- Vitals Details Patient Name: Matthew Evangelist Date of Service: 10/12/2015 2:15 PM Medical Record Number: [id number] Patient Account Number: [id number] Date of Birth/Sex: November 01, 1970 (45 y.o. Male) Treating RN: Phillis Haggis Primary Care Physician: Other Clinician: Referring Physician: Treating Physician/Extender: Altamese [place] in Treatment: 8 Vital Signs Time Taken: 14:24 Temperature (F): 98.1 Height (in): 67 Pulse (bpm): 79 Weight (lbs): 147 Respiratory Rate (breaths/min): 20 Body Mass Index (BMI): 23 Blood Pressure (mmHg): 105/64 Reference Range: 80 - 120 mg / dl Electronic Signature(s) Signed: 10/12/2015 4:44:15 PM By: Alejandro Mulling Entered By: Alejandro Mulling on 10/12/2015 14:25:15

## 2015-10-19 ENCOUNTER — Encounter: Payer: Worker's Compensation | Admitting: Internal Medicine

## 2015-10-19 DIAGNOSIS — L89153 Pressure ulcer of sacral region, stage 3: Secondary | ICD-10-CM | POA: Diagnosis not present

## 2015-10-20 ENCOUNTER — Other Ambulatory Visit
Admission: RE | Admit: 2015-10-20 | Discharge: 2015-10-20 | Disposition: A | Discharge status: Home / Self Care | Payer: Medicare Other | Source: Ambulatory Visit | Attending: Internal Medicine | Admitting: Internal Medicine

## 2015-10-20 DIAGNOSIS — S31819A Unspecified open wound of right buttock, initial encounter: Secondary | ICD-10-CM | POA: Diagnosis not present

## 2015-10-20 DIAGNOSIS — X58XXXA Exposure to other specified factors, initial encounter: Secondary | ICD-10-CM | POA: Insufficient documentation

## 2015-10-20 NOTE — Progress Notes (Addendum)
[name], [name] ([id number]) Visit Report for 10/19/2015 Arrival Information Details Patient Name: [name], [name] Date of Service: 10/19/2015 2:15 PM Medical Record Number: [id number] Patient Account Number: [id number] Date of Birth/Sex: Aug 17, 1970 [age] y.o. Male) Treating RN: Phillis HaggisPinkerton, Debi Primary Care Physician: Other Clinician: Referring Physician: Treating Physician/Extender: Altamese CarolinaOBSON, MICHAEL G Weeks in Treatment: 9 Visit Information History Since Last Visit All ordered tests and consults were completed: No Patient Arrived: Wheel Chair Added or deleted any medications: No Arrival Time: 14:47 Any new allergies or adverse reactions: No Accompanied By: self Had a fall or experienced change in No activities of daily living that may affect Transfer Assistance: Other risk of falls: Patient Identification Verified: Yes Signs or symptoms of abuse/neglect since last No Secondary Verification Process Yes visito Completed: Hospitalized since last visit: No Patient Requires Transmission-Based No Pain Present Now: No Precautions: Patient Has Alerts: No Electronic Signature(s) Signed: 10/19/2015 5:01:48 PM By: Alejandro MullingPinkerton, Debra Entered By: Alejandro MullingPinkerton, Debra on 10/19/2015 14:47:27 [name], [name] ([id number]) -------------------------------------------------------------------------------- Encounter Discharge Information Details Patient Name: [name], [name] Date of Service: 10/19/2015 2:15 PM Medical Record Number: [id number] Patient Account Number: [id number] Date of Birth/Sex: Aug 17, 1970 [age] y.o. Male) Treating RN: Phillis HaggisPinkerton, Debi Primary Care Physician: Other Clinician: Referring Physician: Treating Physician/Extender: Altamese CarolinaOBSON, MICHAEL G Weeks in Treatment: 9 Encounter Discharge Information Items Discharge Pain Level: 0 Discharge Condition: Stable Ambulatory Status: Wheelchair Discharge Destination: Home Transportation: Private Auto Accompanied By: self Schedule Follow-up  Appointment: Yes Medication Reconciliation completed and provided to Patient/Care Yes Frutoso Dimare: Provided on Clinical Summary of Care: 10/19/2015 Form Type [facility]ip[facility] Paper [facility] Electronic Signature(s) Signed: 10/19/2015 3:23:38 PM By: Gwenlyn PerkingMoore, Shelia Entered By: Gwenlyn PerkingMoore, Shelia on 10/19/2015 15:23:38 [name], [name] ([id number]) -------------------------------------------------------------------------------- Lower Extremity Assessment Details Patient Name: [name], [name] Date of Service: 10/19/2015 2:15 PM Medical Record Number: [id number] Patient Account Number: [id number] Date of Birth/Sex: Aug 17, 1970 [age] y.o. Male) Treating RN: Phillis HaggisPinkerton, Debi Primary Care Physician: Other Clinician: Referring Physician: Treating Physician/Extender: Maxwell CaulOBSON, MICHAEL G Weeks in Treatment: 9 Electronic Signature(s) Signed: 10/19/2015 5:01:48 PM By: Alejandro MullingPinkerton, Debra Entered By: Alejandro MullingPinkerton, Debra on 10/19/2015 14:50:01 [name], [name] ([id number]) -------------------------------------------------------------------------------- Multi Wound Chart Details Patient Name: [name], [name] Date of Service: 10/19/2015 2:15 PM Medical Record Number: [id number] Patient Account Number: [id number] Date of Birth/Sex: Aug 17, 1970 [age] y.o. Male) Treating RN: Phillis HaggisPinkerton, Debi Primary Care Physician: Other Clinician: Referring Physician: Treating Physician/Extender: Maxwell CaulOBSON, MICHAEL G Weeks in Treatment: 9 Vital Signs Height(in): 67 Pulse(bpm): 90 Weight(lbs): 147 Blood Pressure 94/64 (mmHg): Body Mass Index(BMI): 23 Temperature(F): 97.2 Respiratory Rate 20 (breaths/min): Photos: [1:No Photos] [N/A:N/A] Wound Location: [1:Left Coccyx] [N/A:N/A] Wounding Event: [1:Pressure Injury] [N/A:N/A] Primary Etiology: [1:Pressure Ulcer] [N/A:N/A] Comorbid History: [1:Paraplegia] [N/A:N/A] Date Acquired: [1:12/24/2014] [N/A:N/A] Weeks of Treatment: [1:9] [N/A:N/A] Wound Status: [1:Open] [N/A:N/A] Measurements L x W x  D 0.9x0.9x0.6 [N/A:N/A] (cm) Area (cm) : [1:0.636] [N/A:N/A] Volume (cm) : [1:0.382] [N/A:N/A] % Reduction in Area: [1:40.00%] [N/A:N/A] % Reduction in Volume: 27.90% [N/A:N/A] Classification: [1:Category/Stage III] [N/A:N/A] Exudate Amount: [1:Large] [N/A:N/A] Exudate Type: [1:Serous] [N/A:N/A] Exudate Color: [1:amber] [N/A:N/A] Foul Odor After [1:Yes] [N/A:N/A] Cleansing: Odor Anticipated Due to No [N/A:N/A] Product Use: Wound Margin: [1:Thickened] [N/A:N/A] Granulation Amount: [1:Medium (34-66%)] [N/A:N/A] Granulation Quality: [1:Pink] [N/A:N/A] Necrotic Amount: [1:Medium (34-66%)] [N/A:N/A] Necrotic Tissue: [1:Eschar, Adherent Slough] [N/A:N/A] Exposed Structures: [1:Fat: Yes Fascia: No] [N/A:N/A] Tendon: No Muscle: No Joint: No Bone: No Epithelialization: None N/A N/A Periwound Skin Texture: No Abnormalities Noted N/A N/A Periwound Skin Maceration: Yes N/A N/A Moisture: Moist: Yes Dry/Scaly: No Periwound Skin Color: No Abnormalities Noted N/A N/A Temperature: No Abnormality N/A  N/A Tenderness on No N/A N/A Palpation: Wound Preparation: Ulcer Cleansing: N/A N/A Rinsed/Irrigated with Saline Topical Anesthetic Applied: Other: lidocaine 4% Treatment Notes Electronic Signature(s) Signed: 10/19/2015 5:01:48 PM By: Alejandro MullingPinkerton, Debra Entered By: Alejandro MullingPinkerton, Debra on 10/19/2015 15:05:24 [name], [name] ([id number]) -------------------------------------------------------------------------------- Multi-Disciplinary Care Plan Details Patient Name: [name], [name] Date of Service: 10/19/2015 2:15 PM Medical Record Number: [id number] Patient Account Number: [id number] Date of Birth/Sex: Sep 08, 1970 [age] y.o. Male) Treating RN: Phillis HaggisPinkerton, Debi Primary Care Physician: Other Clinician: Referring Physician: Treating Physician/Extender: Altamese CarolinaOBSON, MICHAEL G Weeks in Treatment: 9 Active Inactive Abuse / Safety / Falls / Self Care Management Nursing Diagnoses: Potential for  falls Goals: Patient will remain injury free Date Initiated: 08/17/2015 Goal Status: Active Interventions: Assess fall risk on admission and as needed Notes: Nutrition Nursing Diagnoses: Imbalanced nutrition Goals: Patient/caregiver agrees to and verbalizes understanding of need to use nutritional supplements and/or vitamins as prescribed Date Initiated: 08/17/2015 Goal Status: Active Interventions: Assess patient nutrition upon admission and as needed per policy Notes: Orientation to the Wound Care Program Nursing Diagnoses: Knowledge deficit related to the wound healing center program Goals: Patient/caregiver will verbalize understanding of the Wound Healing Center Program [name], Samar ([id number]) Date Initiated: 08/17/2015 Goal Status: Active Interventions: Provide education on orientation to the wound center Notes: Pressure Nursing Diagnoses: Knowledge deficit related to causes and risk factors for pressure ulcer development Goals: Patient will remain free from development of additional pressure ulcers Date Initiated: 08/17/2015 Goal Status: Active Interventions: Assess: immobility, friction, shearing, incontinence upon admission and as needed Notes: Wound/Skin Impairment Nursing Diagnoses: Impaired tissue integrity Goals: Ulcer/skin breakdown will have a volume reduction of 30% by week 4 Date Initiated: 08/17/2015 Goal Status: Active Ulcer/skin breakdown will have a volume reduction of 50% by week 8 Date Initiated: 08/17/2015 Goal Status: Active Ulcer/skin breakdown will have a volume reduction of 80% by week 12 Date Initiated: 08/17/2015 Goal Status: Active Interventions: Assess patient/caregiver ability to obtain necessary supplies Assess ulceration(s) every visit Notes: Electronic Signature(s) Signed: 10/19/2015 5:01:48 PM By: Servando SnarePinkerton, Debra [place]orks, Leonette MostHARLES ([id number]) Entered By: Alejandro MullingPinkerton, Debra on 10/19/2015 15:05:17 [name], Zev  ([id number]) -------------------------------------------------------------------------------- Pain Assessment Details Patient Name: [name], Nickalos Date of Service: 10/19/2015 2:15 PM Medical Record Number: [id number] Patient Account Number: [id number] Date of Birth/Sex: Sep 08, 1970 [age] y.o. Male) Treating RN: Phillis HaggisPinkerton, Debi Primary Care Physician: Other Clinician: Referring Physician: Treating Physician/Extender: Maxwell CaulOBSON, MICHAEL G Weeks in Treatment: 9 Active Problems Location of Pain Severity and Description of Pain Patient Has Paino No Site Locations With Dressing Change: No Pain Management and Medication Current Pain Management: Electronic Signature(s) Signed: 10/19/2015 5:01:48 PM By: Alejandro MullingPinkerton, Debra Entered By: Alejandro MullingPinkerton, Debra on 10/19/2015 14:47:35 [name], Reinhardt ([id number]) -------------------------------------------------------------------------------- Patient/Caregiver Education Details Patient Name: [name], Jaylend Date of Service: 10/19/2015 2:15 PM Medical Record Number: [id number] Patient Account Number: [id number] Date of Birth/Gender: Sep 08, 1970 [age] y.o. Male) Treating RN: Phillis HaggisPinkerton, Debi Primary Care Physician: Other Clinician: Referring Physician: Treating Physician/Extender: Altamese CarolinaOBSON, MICHAEL G Weeks in Treatment: 9 Education Assessment Education Provided To: Patient Education Topics Provided Wound/Skin Impairment: Handouts: Other: change dressing as ordered Methods: Demonstration, Explain/Verbal Responses: State content correctly Electronic Signature(s) Signed: 10/19/2015 5:01:48 PM By: Alejandro MullingPinkerton, Debra Entered By: Alejandro MullingPinkerton, Debra on 10/19/2015 15:16:20 [name], Kashawn ([id number]) -------------------------------------------------------------------------------- Wound Assessment Details Patient Name: [name], Makana Date of Service: 10/19/2015 2:15 PM Medical Record Number: [id number] Patient Account Number: [id number] Date of Birth/Sex: Sep 08, 1970 [age]  y.o. Male) Treating RN: Phillis HaggisPinkerton, Debi Primary Care Physician: Other Clinician: Referring Physician: Treating Physician/Extender: Maxwell CaulOBSON, MICHAEL G Weeks in Treatment: 9 Wound Status  Wound Number: 1 Primary Etiology: Pressure Ulcer Wound Location: Left Coccyx Wound Status: Open Wounding Event: Pressure Injury Comorbid History: Paraplegia Date Acquired: 12/24/2014 Weeks Of Treatment: 9 Clustered Wound: No Photos Photo Uploaded By: Alejandro Mulling on 10/19/2015 17:05:16 Wound Measurements Length: (cm) 0.9 Width: (cm) 0.9 Depth: (cm) 0.6 Area: (cm) 0.636 Volume: (cm) 0.382 % Reduction in Area: 40% % Reduction in Volume: 27.9% Epithelialization: None Tunneling: No Undermining: No Wound Description Classification: Category/Stage III Wound Margin: Thickened Exudate Amount: Large Exudate Type: Serous Exudate Color: amber Foul Odor After Cleansing: Yes Due to Product Use: No Wound Bed Granulation Amount: Medium (34-66%) Exposed Structure Granulation Quality: Pink Fascia Exposed: No Necrotic Amount: Medium (34-66%) Fat Layer Exposed: Yes Necrotic Quality: Eschar, Adherent Slough Tendon Exposed: No IBN, STIEF ([id number]) Muscle Exposed: No Joint Exposed: No Bone Exposed: No Periwound Skin Texture Texture Color No Abnormalities Noted: No No Abnormalities Noted: No Moisture Temperature / Pain No Abnormalities Noted: No Temperature: No Abnormality Dry / Scaly: No Maceration: Yes Moist: Yes Wound Preparation Ulcer Cleansing: Rinsed/Irrigated with Saline Topical Anesthetic Applied: Other: lidocaine 4%, Treatment Notes Wound #1 (Left Coccyx) 1. Cleansed with: Clean wound with Normal Saline 2. Anesthetic Topical Lidocaine 4% cream to wound bed prior to debridement 3. Peri-wound Care: Skin Prep 4. Dressing Applied: Aquacel Ag 5. Secondary Dressing Applied Bordered Foam Dressing Electronic Signature(s) Signed: 10/19/2015 5:01:48 PM By: Alejandro Mulling Entered By: Alejandro Mulling on 10/19/2015 14:58:26 Matthew Evangelist ([id number]) -------------------------------------------------------------------------------- Vitals Details Patient Name: Matthew Evangelist Date of Service: 10/19/2015 2:15 PM Medical Record Number: [id number] Patient Account Number: [id number] Date of Birth/Sex: 01/15/1971 (45 y.o. Male) Treating RN: Phillis Haggis Primary Care Physician: Other Clinician: Referring Physician: Treating Physician/Extender: Altamese [place] in Treatment: 9 Vital Signs Time Taken: 14:48 Temperature (F): 97.2 Height (in): 67 Pulse (bpm): 90 Weight (lbs): 147 Respiratory Rate (breaths/min): 20 Body Mass Index (BMI): 23 Blood Pressure (mmHg): 94/64 Reference Range: 80 - 120 mg / dl Electronic Signature(s) Signed: 10/19/2015 5:01:48 PM By: Alejandro Mulling Entered By: Alejandro Mulling on 10/19/2015 14:49:11

## 2015-10-21 NOTE — Progress Notes (Signed)
[name], Maxey ([id number]) Visit Report for 10/19/2015 Chief Complaint Document Details Patient Name: [name], [name] Date of Service: 10/19/2015 2:15 PM Medical Record Patient Account Number: [id number] Number: Treating RN: Phillis Haggisinkerton, Debi 07/16/1970 (45 y.o. Other Clinician: Date of Birth/Sex: Male) Treating Marium Ragan Primary Care Physician: Physician/Extender: G Referring Physician: Weeks in Treatment: 9 Information Obtained from: Patient Chief Complaint 08/17/15; the patient is recently really [place] [facility] [place]. He is here to establish a wound care for a stage III ulcer on his lower sacral area. Electronic Signature(s) Signed: 10/20/2015 12:38:30 PM By: Baltazar Najjarobson, Rocky Gladden MD Entered By: Baltazar Najjarobson, Talbot Monarch on 10/20/2015 07:20:38 [name], Samaj ([id number]) -------------------------------------------------------------------------------- Debridement Details Patient Name: [name], Jonanthan Date of Service: 10/19/2015 2:15 PM Medical Record Patient Account Number: [id number] Number: Treating RN: Phillis Haggisinkerton, Debi 07/16/1970 (45 y.o. Other Clinician: Date of Birth/Sex: Male) Treating Doneta Bayman Primary Care Physician: Physician/Extender: G Referring Physician: Weeks in Treatment: 9 Debridement Performed for Wound #1 Left Coccyx Assessment: Performed By: Physician Maxwell CaulOBSON, Sabastien Tyler G, MD Debridement: Debridement Pre-procedure Yes Verification/Time Out Taken: Start Time: 15:05 Pain Control: Other : lidocaine 4% cream Level: Skin/Subcutaneous Tissue Total Area Debrided (L x 0.9 (cm) x 0.9 (cm) = 0.81 (cm) W): Tissue and other Viable, Non-Viable, Exudate, Fibrin/Slough, Subcutaneous material debrided: Instrument: Curette Bleeding: Moderate Hemostasis Achieved: Silver Nitrate End Time: 15:08 Procedural Pain: 0 Post Procedural Pain: 0 Response to Treatment: Procedure was tolerated well Post Debridement Measurements of Total  Wound Length: (cm) 1 Stage: Category/Stage III Width: (cm) 1.2 Depth: (cm) 0.4 Volume: (cm) 0.377 Post Procedure Diagnosis Same as Pre-procedure Electronic Signature(s) Signed: 10/20/2015 12:38:30 PM By: Baltazar Najjarobson, Komal Stangelo MD Signed: 10/20/2015 5:39:03 PM By: Alejandro MullingPinkerton, Debra Previous Signature: 10/19/2015 5:01:48 PM Version By: Alejandro MullingPinkerton, Debra Entered By: Baltazar Najjarobson, Khyle Goodell on 10/20/2015 07:20:27 [name], Orry ([id number]) [name], [name] ([id number]) -------------------------------------------------------------------------------- HPI Details Patient Name: [name], [name] Date of Service: 10/19/2015 2:15 PM Medical Record Patient Account Number: [id number] Number: Treating RN: Phillis Haggisinkerton, Debi 07/16/1970 (45 y.o. 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 He was diagnosed with what sounds like a stage III pressure ulcer in September 2016 although this may have been present for some time longer than that perhaps July 2016. [place] By his description there were using Silvadene cream for a period of time and then 3 months ago change to Aquacel Ag which she is continued and changes daily with help from his mother. [facility] [place] [facility] [place] [facility]. He went to urgent care to follow-up on his wound on 08/04/2015 they did a culture of this wound that showed Pseudomonas and MSSA. He was placed on an antibiotic but doesn't remember which one it is. He has continued using Aquacel Ag and a foam based cover. I don't have any of his records from the wound care center [place] although the patient states he did have an MRI that did not show  osteomyelitis. He is not aware of having a bone culture done and doesn't think that he had exposed bone on this and any point. The patient does in and out cath's and has no urinary incontinence issues. He is on appropriate bowel regimen which she administers suppositories. He is able to position himself appropriately and is wheelchair  to try and avoid pressure over this wound 08/24/15; small horizontal wound over his lower sacral area. Requires debridement of surface slough and nonviable subcutaneous tissue. There is a small amount of undermining noted no evidence of infection. On the right side of this wound there is some senescent rolled edges which may need debridement I did not do this today. 09/01/15 this is a small wound over the lower sacral area. It has tunneling superiorly. This goes roughly 1 cm. Nonviable subcutaneous tissue at the inferior aspect of the wound base. No real evidence of infection 09/07/15; small horizontal wound over the lower sacral area. Last week it had 1 cm of superior tunneling which appears to of resolved. No evidence of infection 09/21/15; continues to have a small horizontal wound over the lower sacral area. We have been using Prisma and this has contracted. He tells me that his mother is aware medication not able to change the dressing although he states that he is able to do this 09/28/15; a small horizontal wound over his lower sacral area is all that is left of this wound which was probably much larger and deeper at one point. Depth today at 0.7 cm. Base of this had some nonviable subcutaneous tissue that I removed. Appears to be stalling on current dressing which is Prisma 10/19/15; I have not seen this patient in several weeks. He arrives today with a linear/horizontal wound across his lower sacral area. Considerable depth of this wound. Extensive debridement done Electronic Signature(s) Signed: 10/20/2015 12:38:30 PM By: Baltazar Najjar MD Entered By: Baltazar Najjar on 10/20/2015 07:24:09 ESHAAN, TITZER ([id number]) OLUFEMI, MOFIELD ([id number]) -------------------------------------------------------------------------------- Physical Exam Details Patient Name: Matthew Evangelist Date of Service: 10/19/2015 2:15 PM Medical Record Patient Account Number: [id number] Number: Treating RN: Phillis Haggis 1970/05/31 (45 y.o. Other Clinician: Date of Birth/Sex: Male) Treating Alizza Sacra Primary Care Physician: Physician/Extender: G Referring Physician: Weeks in Treatment: 9 Constitutional Patient is hypotensive.. Pulse regular and within target range for patient.Marland Kitchen Respirations regular, non-labored and within target range.. Temperature is normal and within the target range for the patient.. . Appears in no acute distress. .. Notes 10/19/15 the patient has a horizontal wound across his lower sacrum which is been in terms of with an only a short distance in terms of length however it has considerable depth surrounded by rolled senescent edges of probably nonviable subcutaneous tissue. After reviewing this today I did not feel there was evidence of infection there is not promoting healing of this. There is no surrounding erythema or soft tissue difficulties. I elected to do a substantial debridement of the senescent skin and subcutaneous tissue around this wound to fully expose the wound bed. This added to the dimensions however I am hoping that this will allow some epithelialization around this area. The patient claims he is offloading this religiously. His mother assists with the dressings Electronic Signature(s) Signed: 10/20/2015 12:38:30 PM By: Baltazar Najjar MD Entered By: Baltazar Najjar on 10/20/2015 07:27:29 DARBY, SHADWICK ([id number]) -------------------------------------------------------------------------------- Physician Orders Details Patient Name: Matthew Evangelist Date of Service: 10/19/2015 2:15 PM Medical Record Patient  Account Number: [id number] Number: Treating RN: Phillis Haggis 1971-04-13 (45 y.o. Other Clinician: Date of Birth/Sex: Male) Treating Sabrina Arriaga Primary Care Physician: Physician/Extender: G Referring Physician: Weeks in Treatment: 9 Verbal / Phone Orders: Yes Clinician: Ashok Cordia, Debi Read Back and Verified: Yes Diagnosis Coding Wound Cleansing Wound #1 Left Coccyx o Clean wound with Normal Saline. Anesthetic Wound #1 Left Coccyx o Topical Lidocaine 4% cream applied  to wound bed prior to debridement Skin Barriers/Peri-Wound Care Wound #1 Left Coccyx o Skin Prep Primary Wound Dressing o Aquacel Ag Secondary Dressing Wound #1 Left Coccyx o Boardered Foam Dressing - Optifoam Dressing Change Frequency Wound #1 Left Coccyx o Change dressing every other day. - unless there is a lot of drainage Follow-up Appointments Wound #1 Left Coccyx o Return Appointment in 2 weeks. Off-Loading Wound #1 Left Coccyx o Turn and reposition every 2 hours Additional Orders / Instructions Wound #1 Left Coccyx Darrian, Grzelak Demarco ([id number]) o Increase protein intake. Laboratory o Bacteria identified in Wound by Culture (MICRO) - left coccyx oooo LOINC Code: 6462-6 oooo Convenience Name: Wound culture routine Electronic Signature(s) Signed: 10/19/2015 5:13:28 PM By: Alejandro Mulling Signed: 10/20/2015 12:38:30 PM By: Baltazar Najjar MD Previous Signature: 10/19/2015 5:01:48 PM Version By: Alejandro Mulling Entered By: Alejandro Mulling on 10/19/2015 17:03:29 CELVIN, TANEY ([id number]) -------------------------------------------------------------------------------- Problem List Details Patient Name: Matthew Evangelist Date of Service: 10/19/2015 2:15 PM Medical Record Patient Account Number: [id number] Number: Treating RN: Phillis Haggis 1970-09-07 (45 y.o. Other Clinician: Date of Birth/Sex: Male) Treating Urbano Milhouse Primary Care  Physician: Physician/Extender: G Referring Physician: Weeks in Treatment: 9 Active Problems ICD-10 Encounter Code Description Active Date Diagnosis L89.153 Pressure ulcer of sacral region, stage 3 08/17/2015 Yes G82.22 Paraplegia, incomplete 08/17/2015 Yes Inactive Problems Resolved Problems Electronic Signature(s) Signed: 10/20/2015 12:38:30 PM By: Baltazar Najjar MD Entered By: Baltazar Najjar on 10/20/2015 07:20:12 JOEPH, SZATKOWSKI ([id number]) -------------------------------------------------------------------------------- Progress Note Details Patient Name: Matthew Evangelist Date of Service: 10/19/2015 2:15 PM Medical Record Patient Account Number: [id number] Number: Treating RN: Phillis Haggis 1971/01/02 (45 y.o. Other Clinician: Date of Birth/Sex: Male) Treating Tanyika Barros Primary Care Physician: Physician/Extender: G Referring Physician: Weeks in Treatment: 9 Subjective Chief Complaint Information obtained from Patient 08/17/15; the patient is recently really relocated from [facility]. He is here to establish a wound care for a stage III ulcer on his lower sacral area. 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 He was diagnosed with what sounds like a stage III pressure ulcer in September 2016 although this may have been present for some time longer than that perhaps July 2016. He was followed by wound care in [place]. By his description there were using Silvadene cream for a period of time and then 3 months ago change to Aquacel Ag which she is continued and changes daily with help from his mother. He has recently relocated to [facility] [place]. He went to urgent care to follow- up on his wound on 08/04/2015 they did a culture of this wound that showed Pseudomonas and MSSA. He was placed on an antibiotic but doesn't  remember which one it is. He has continued using Aquacel Ag and a foam based cover. I don't have any of his records from the wound care center in [place] although the patient states he did have an MRI that did not show osteomyelitis. He is not aware of having a bone culture done and doesn't think that he had exposed bone on this and any point. The patient does in and out cath's and has no urinary incontinence issues. He is on appropriate bowel regimen which she administers suppositories. He is able to position himself appropriately and is wheelchair to try and avoid pressure over this wound 08/24/15; small horizontal wound over his lower sacral area. Requires debridement of surface slough and  nonviable subcutaneous tissue. There is a small amount of undermining noted no evidence of infection. On the right side of this wound there is some senescent rolled edges which may need debridement I did not do this today. 09/01/15 this is a small wound over the lower sacral area. It has tunneling superiorly. This goes roughly 1 cm. Nonviable subcutaneous tissue at the inferior aspect of the wound base. No real evidence of infection 09/07/15; small horizontal wound over the lower sacral area. Last week it had 1 cm of superior tunneling which appears to of resolved. No evidence of infection 09/21/15; continues to have a small horizontal wound over the lower sacral area. We have been using Prisma and this has contracted. He tells me that his mother is aware medication not able to change the dressing although he states that he is able to do this 09/28/15; a small horizontal wound over his lower sacral area is all that is left of this wound which was probably much larger and deeper at one point. Depth today at 0.7 cm. Base of this had some nonviable subcutaneous tissue that I removed. Appears to be stalling on current dressing which is Prisma 10/19/15; I have not seen this patient in several weeks. He arrives  today with a linear/horizontal wound ALDAIR, RICKEL ([id number]) across his lower sacral area. Considerable depth of this wound. Extensive debridement done Objective Constitutional Patient is hypotensive.. Pulse regular and within target range for patient.Marland Kitchen Respirations regular, non-labored and within target range.. Temperature is normal and within the target range for the patient.. . Appears in no acute distress. .. Vitals Time Taken: 2:48 PM, Height: 67 in, Weight: 147 lbs, BMI: 23, Temperature: 97.2 F, Pulse: 90 bpm, Respiratory Rate: 20 breaths/min, Blood Pressure: 94/64 mmHg. General Notes: 10/19/15 the patient has a horizontal wound across his lower sacrum which is been in terms of with an only a short distance in terms of length however it has considerable depth surrounded by rolled senescent edges of probably nonviable subcutaneous tissue. After reviewing this today I did not feel there was evidence of infection there is not promoting healing of this. There is no surrounding erythema or soft tissue difficulties. I elected to do a substantial debridement of the senescent skin and subcutaneous tissue around this wound to fully expose the wound bed. This added to the dimensions however I am hoping that this will allow some epithelialization around this area. The patient claims he is offloading this religiously. His mother assists with the dressings Integumentary (Hair, Skin) Wound #1 status is Open. Original cause of wound was Pressure Injury. The wound is located on the Left Coccyx. The wound measures 0.9cm length x 0.9cm width x 0.6cm depth; 0.636cm^2 area and 0.382cm^3 volume. There is fat exposed. There is no tunneling or undermining noted. There is a large amount of serous drainage noted. The wound margin is thickened. There is medium (34-66%) pink granulation within the wound bed. There is a medium (34-66%) amount of necrotic tissue within the wound bed including Eschar and  Adherent Slough. The periwound skin appearance exhibited: Maceration, Moist. The periwound skin appearance did not exhibit: Dry/Scaly. Periwound temperature was noted as No Abnormality. Assessment Active Problems ICD-10 L89.153 - Pressure ulcer of sacral region, stage 3 G82.22 - Paraplegia, incomplete Rothermel, Phillips ([id number]) Procedures Wound #1 Wound #1 is a Pressure Ulcer located on the Left Coccyx . There was a Skin/Subcutaneous Tissue Debridement ([id number]) debridement with total area of 0.81 sq cm performed by Maxwell Caul, MD.  with the following instrument(s): Curette to remove Viable and Non-Viable tissue/material including Exudate, Fibrin/Slough, and Subcutaneous after achieving pain control using Other (lidocaine 4% cream). A time out was conducted prior to the start of the procedure. A Moderate amount of bleeding was controlled with Silver Nitrate. The procedure was tolerated well with a pain level of 0 throughout and a pain level of 0 following the procedure. Post Debridement Measurements: 1cm length x 1.2cm width x 0.4cm depth; 0.377cm^3 volume. Post debridement Stage noted as Category/Stage III. Post procedure Diagnosis Wound #1: Same as Pre-Procedure Plan Wound Cleansing: Wound #1 Left Coccyx: Clean wound with Normal Saline. Anesthetic: Wound #1 Left Coccyx: Topical Lidocaine 4% cream applied to wound bed prior to debridement Skin Barriers/Peri-Wound Care: Wound #1 Left Coccyx: Skin Prep Primary Wound Dressing: Aquacel Ag Secondary Dressing: Wound #1 Left Coccyx: Boardered Foam Dressing - Optifoam Dressing Change Frequency: Wound #1 Left Coccyx: Change dressing every other day. - unless there is a lot of drainage Follow-up Appointments: Wound #1 Left Coccyx: Return Appointment in 2 weeks. Off-Loading: Wound #1 Left Coccyx: Turn and reposition every 2 hours Additional Orders / Instructions: Wound #1 Left Coccyx: Increase protein intake. Laboratory  ordered were: [name], Gailen ([id number]) Wound culture routine - left coccyx #1 we changed the dressings Aquacel Ag as the surrounding tissue seems to be macerated and wet although the patient is not really describing a lot of drainage. There was no evidence of infection and I didn't think any cultures aren't. Antibiotics were needed. This will be covered with a border foam. #2 as noted an extensive debridement was done here. Electronic Signature(s) Signed: 10/20/2015 12:38:30 PM By: Baltazar Najjarobson, Marquesha Robideau MD Entered By: Baltazar Najjarobson, Demonta Wombles on 10/20/2015 07:30:46 [name], Willett ([id number]) -------------------------------------------------------------------------------- SuperBill Details Patient Name: [name], Mickeal Date of Service: 10/19/2015 Medical Record Patient Account Number: [id number] Number: Treating RN: Phillis Haggisinkerton, Debi 1971/01/31 (45 y.o. Other Clinician: Date of Birth/Sex: Male) Treating Erasto Sleight Primary Care Physician: Physician/Extender: G Referring Physician: Weeks in Treatment: 9 Diagnosis Coding ICD-10 Codes Code Description L89.153 Pressure ulcer of sacral region, stage 3 G82.22 Paraplegia, incomplete Facility Procedures CPT4 Code: [id number] Description: 11042 - DEB SUBQ TISSUE 20 SQ CM/< ICD-10 Description Diagnosis L89.153 Pressure ulcer of sacral region, stage 3 Modifier: Quantity: 1 Physician Procedures CPT4 Code: [id number] Description: 11042 - WC PHYS SUBQ TISS 20 SQ CM ICD-10 Description Diagnosis L89.153 Pressure ulcer of sacral region, stage 3 Modifier: Quantity: 1 Electronic Signature(s) Signed: 10/20/2015 12:38:30 PM By: Baltazar Najjarobson, Camyra Vaeth MD Entered By: Baltazar Najjarobson, Jowanda Heeg on 10/20/2015 07:31:08

## 2015-10-23 LAB — AEROBIC CULTURE W GRAM STAIN (SUPERFICIAL SPECIMEN)

## 2015-10-23 LAB — AEROBIC CULTURE  (SUPERFICIAL SPECIMEN)

## 2015-10-28 ENCOUNTER — Encounter: Payer: Medicare Other | Attending: Physical Medicine & Rehabilitation | Admitting: Physical Medicine & Rehabilitation

## 2015-10-28 ENCOUNTER — Encounter: Payer: Self-pay | Admitting: Physical Medicine & Rehabilitation

## 2015-10-28 VITALS — BP 105/72 | HR 111 | Resp 17

## 2015-10-28 DIAGNOSIS — S31000A Unspecified open wound of lower back and pelvis without penetration into retroperitoneum, initial encounter: Secondary | ICD-10-CM | POA: Diagnosis not present

## 2015-10-28 DIAGNOSIS — G479 Sleep disorder, unspecified: Secondary | ICD-10-CM | POA: Diagnosis not present

## 2015-10-28 DIAGNOSIS — N319 Neuromuscular dysfunction of bladder, unspecified: Secondary | ICD-10-CM | POA: Insufficient documentation

## 2015-10-28 DIAGNOSIS — Z5189 Encounter for other specified aftercare: Secondary | ICD-10-CM | POA: Diagnosis not present

## 2015-10-28 DIAGNOSIS — M62838 Other muscle spasm: Secondary | ICD-10-CM

## 2015-10-28 DIAGNOSIS — R293 Abnormal posture: Secondary | ICD-10-CM | POA: Diagnosis not present

## 2015-10-28 DIAGNOSIS — M6249 Contracture of muscle, multiple sites: Secondary | ICD-10-CM

## 2015-10-28 DIAGNOSIS — K592 Neurogenic bowel, not elsewhere classified: Secondary | ICD-10-CM | POA: Insufficient documentation

## 2015-10-28 DIAGNOSIS — G8222 Paraplegia, incomplete: Secondary | ICD-10-CM | POA: Diagnosis not present

## 2015-10-28 DIAGNOSIS — G8220 Paraplegia, unspecified: Secondary | ICD-10-CM | POA: Diagnosis not present

## 2015-10-28 DIAGNOSIS — R252 Cramp and spasm: Secondary | ICD-10-CM | POA: Diagnosis not present

## 2015-10-28 MED ORDER — DIAZEPAM 5 MG PO TABS: 5.0000 mg | ORAL_TABLET | Freq: Two times a day (BID) | ORAL | Status: DC | PRN

## 2015-10-28 MED ORDER — BACLOFEN 10 MG PO TABS: 5.0000 mg | ORAL_TABLET | Freq: Three times a day (TID) | ORAL | Status: DC

## 2015-10-28 NOTE — Progress Notes (Signed)
Subjective:    Patient ID: [name], male    DOB: October 07, 1970, 45 y.o.   MRN: [id number]  HPI  45 y/o male with pmh of T8 SCI 2007 and psh of baclofen pump removal in 04/2013 presents for management of SCI.  His main issues today are his postures and asymmetrical weakness and spasms.  Pt had his ITB pump removed due to inconvenience and stressors of withdrawal due to pump failure. [place] was living [place] and recently [place].  He is looking to establish care as well.  He has never had AD.  He I/O cath 4-6/day with condom cath at night.  He saw urologist last week and does not report issues.  He does AM bowel program.  No movement or sensation below T8.  He has a new wheelchair. He is independent, lives with his mother.  For spasms, he has tried oral baclofen 20mg  TID, but made his lethargic.  He took valium, which he usually took at night.    Pain Inventory Average Pain 7 Pain Right Now 6 My pain is constant, sharp, burning and aching  In the last 24 hours, has pain interfered with the following? General activity 5 Relation with others 7 Enjoyment of life 5 What TIME of day is your pain at its worst? morning, night Sleep (in general) Poor  Pain is worse with: unsure and some activites Pain improves with: rest, pacing activities and medication Relief from Meds: NA  Mobility do you drive?  yes use a wheelchair transfers alone Do you have any goals in this area?  no  Function Do you have any goals in this area?  no  Neuro/Psych bladder control problems weakness numbness tremor tingling spasms anxiety  Prior Studies Any changes since last visit?  no bone scan x-rays CT/MRI  Physicians involved in your care Primary care Dorothy Scifres    History reviewed. No pertinent family history. Social History   Social History  . Marital Status: Divorced    Spouse Name: N/A  . Number of Children: N/A  . Years of Education: N/A   Social History Main Topics    . Smoking status: Current Every Day Smoker -- 0.50 packs/day    Types: Cigarettes  . Smokeless tobacco: None  . Alcohol Use: None  . Drug Use: None  . Sexual Activity: Not Asked   Other Topics Concern  . None   Social History Narrative  . None   Past Surgical History  Procedure Laterality Date  . Programable baclofen pump revision Left 2014    Removal    Past Medical History  Diagnosis Date  . Thoracic spinal cord injury (HCC)    BP 105/72 mmHg  Pulse 111  Resp 17  SpO2 93%  Opioid Risk Score:   Fall Risk Score:  `1  Depression screen PHQ 2/9  Depression screen PHQ 2/9 10/28/2015  Decreased Interest 1  Down, Depressed, Hopeless 1  PHQ - 2 Score 2  Altered sleeping 2  Tired, decreased energy 1  Change in appetite 0  Feeling bad or failure about yourself  1  Trouble concentrating 1  Moving slowly or fidgety/restless 1  Suicidal thoughts 0  PHQ-9 Score 8  Difficult doing work/chores Somewhat difficult    Review of Systems  Constitutional: Positive for diaphoresis.       Bladder control problems   Gastrointestinal: Positive for abdominal pain.  Skin: Positive for rash.  Neurological: Positive for tremors, weakness and numbness.  Tingling Spasms   Psychiatric/Behavioral: The patient is nervous/anxious.   All other systems reviewed and are negative.     Objective:   Physical Exam Gen: NAD. Vital signs reviewed HENT: Normocephalic, Atraumatic Eyes: EOMI, Conj WNL Cardio: S1, S2 normal, RRR Pulm: B/l clear to auscultation.  Effort normal Abd: Soft, non-distended, non-tender, BS+ MSK:  Gait nonambulatory.   No TTP.    No edema. Neuro: CN II-XII grossly intact.    Sensation absent below T8 dermatomes  Reflexes 1+ B/l LE  Strength  5/5 in all UE myotomes    0/5 in all LE myotomes  Significant spasms in B/l LE, making it difficult to assess spasticity, most pronounces in adductors Skin: Warm and Dry    Assessment & Plan:  45 y/o male with pmh of  T8 SCI 2007 and psh of baclofen pump removal in 04/2013 presents for management of SCI.  1. T8 Paraplegia  Will order PT for revaluation, postural exercises  Cont ROM, stretching  2. Neurogenic Bladder  Saw Urology last week, which was relatively unremarkable  Will consider cystoscopy in future  3. Neurogenic bowel  Cont bowel reg with suppository  4. Spasticity/Spasms   Had ITB pump, but did not want to keep due to convenience and stressors of withdrawls  Will order Baclofen 5 TID  Will order Valium 5mg  BID  5. Abnormal posture  Will order PT for postural stabilization and ?seating evaluation    6. Left lower back open wound  Since 12/2015, healing per pt  7. Sleep disturbance  Valium 5mg

## 2015-11-01 DIAGNOSIS — M654 Radial styloid tenosynovitis [de Quervain]: Secondary | ICD-10-CM | POA: Diagnosis not present

## 2015-11-01 DIAGNOSIS — M79645 Pain in left finger(s): Secondary | ICD-10-CM | POA: Diagnosis not present

## 2015-11-02 ENCOUNTER — Ambulatory Visit: Payer: PRIVATE HEALTH INSURANCE | Admitting: Nurse Practitioner

## 2015-11-03 ENCOUNTER — Ambulatory Visit: Payer: PRIVATE HEALTH INSURANCE | Admitting: Nurse Practitioner

## 2015-11-05 ENCOUNTER — Encounter: Payer: Worker's Compensation | Attending: General Surgery | Admitting: General Surgery

## 2015-11-05 DIAGNOSIS — G8222 Paraplegia, incomplete: Secondary | ICD-10-CM | POA: Insufficient documentation

## 2015-11-05 DIAGNOSIS — L89153 Pressure ulcer of sacral region, stage 3: Secondary | ICD-10-CM | POA: Insufficient documentation

## 2015-11-05 DIAGNOSIS — Z8249 Family history of ischemic heart disease and other diseases of the circulatory system: Secondary | ICD-10-CM | POA: Insufficient documentation

## 2015-11-05 DIAGNOSIS — Z0000 Encounter for general adult medical examination without abnormal findings: Secondary | ICD-10-CM | POA: Diagnosis not present

## 2015-11-05 DIAGNOSIS — F17210 Nicotine dependence, cigarettes, uncomplicated: Secondary | ICD-10-CM | POA: Diagnosis not present

## 2015-11-05 DIAGNOSIS — Z136 Encounter for screening for cardiovascular disorders: Secondary | ICD-10-CM | POA: Diagnosis not present

## 2015-11-05 DIAGNOSIS — G8220 Paraplegia, unspecified: Secondary | ICD-10-CM | POA: Diagnosis not present

## 2015-11-05 DIAGNOSIS — Z79899 Other long term (current) drug therapy: Secondary | ICD-10-CM | POA: Diagnosis not present

## 2015-11-05 NOTE — Progress Notes (Signed)
Debrided callous from around 1 cm ulcer.. Dress with silver alginate

## 2015-11-06 NOTE — Progress Notes (Signed)
FAIZ, WEBER ([id number]) Visit Report for 11/05/2015 Arrival Information Details Patient Name: [name] Date of Service: 11/05/2015 10:00 AM Medical Record Number: [id number] Patient Account Number: [id number] Date of Birth/Sex: [birth year] (45 y.o. Male) Treating RN: Phillis Haggis Primary Care Physician: Other Clinician: Referring Physician: Treating Physician/Extender: Elayne Snare in Treatment: 11 Visit Information History Since Last Visit All ordered tests and consults were completed: No Patient Arrived: Wheel Chair Added or deleted any medications: No Arrival Time: 09:55 Any new allergies or adverse reactions: No Accompanied By: self Had a fall or experienced change in No activities of daily living that may affect Transfer Assistance: Other risk of falls: Patient Identification Verified: Yes Signs or symptoms of abuse/neglect since last No Secondary Verification Process Yes visito Completed: Hospitalized since last visit: No Patient Requires Transmission-Based No Pain Present Now: No Precautions: Patient Has Alerts: No Electronic Signature(s) Signed: 11/05/2015 4:39:38 PM By: Alejandro Mulling Entered By: Alejandro Mulling on 11/05/2015 09:56:18 [name] ([id number]) -------------------------------------------------------------------------------- Encounter Discharge Information Details Patient Name: [name] Date of Service: 11/05/2015 10:00 AM Medical Record Number: [id number] Patient Account Number: [id number] Date of Birth/Sex: 18-Oct-1970 (45 y.o. Male) Treating RN: Phillis Haggis Primary Care Physician: Other Clinician: Referring Physician: Treating Physician/Extender: Elayne Snare in Treatment: 11 Encounter Discharge Information Items Discharge Pain Level: 0 Discharge Condition: Stable Ambulatory Status: Wheelchair Discharge Destination: Home Private Transportation: Auto Accompanied By: self Schedule Follow-up  Appointment: Yes Medication Reconciliation completed and Yes provided to Patient/Care [name]: Clinical Summary of Care: Electronic Signature(s) Signed: 11/05/2015 12:28:09 PM By: Ardath Sax MD Entered By: Ardath Sax on 11/05/2015 12:28:09 [name] ([id number]) -------------------------------------------------------------------------------- Lower Extremity Assessment Details Patient Name: [name] Date of Service: 11/05/2015 10:00 AM Medical Record Number: [id number] Patient Account Number: [id number] Date of Birth/Sex: November 29, 1970 (46 y.o. Male) Treating RN: Phillis Haggis Primary Care Physician: Other Clinician: Referring Physician: Treating Physician/Extender: Ardath Sax Weeks in Treatment: 11 Electronic Signature(s) Signed: 11/05/2015 4:39:38 PM By: Alejandro Mulling Entered By: Alejandro Mulling on 11/05/2015 09:57:06 [name] ([id number]) -------------------------------------------------------------------------------- Multi Wound Chart Details Patient Name: [name] Date of Service: 11/05/2015 10:00 AM Medical Record Number: [id number] Patient Account Number: [id number] Date of Birth/Sex: [birth year] (45 y.o. Male) Treating RN: Phillis Haggis Primary Care Physician: Other Clinician: Referring Physician: Treating Physician/Extender: Elayne Snare in Treatment: 11 Photos: [1:No Photos] [N/A:N/A] Wound Location: [1:Left Coccyx] [N/A:N/A] Wounding Event: [1:Pressure Injury] [N/A:N/A] Primary Etiology: [1:Pressure Ulcer] [N/A:N/A] Comorbid History: [1:Paraplegia] [N/A:N/A] Date Acquired: [1:12/24/2014] [N/A:N/A] Weeks of Treatment: [1:11] [N/A:N/A] Wound Status: [1:Open] [N/A:N/A] Measurements L x W x D 0.4x1x0.6 [N/A:N/A] (cm) Area (cm) : [1:0.314] [N/A:N/A] Volume (cm) : [1:0.188] [N/A:N/A] % Reduction in Area: [1:70.40%] [N/A:N/A] % Reduction in Volume: 64.50% [N/A:N/A] Classification: [1:Category/Stage III] [N/A:N/A] Exudate  Amount: [1:Large] [N/A:N/A] Exudate Type: [1:Serous] [N/A:N/A] Exudate Color: [1:amber] [N/A:N/A] Foul Odor After [1:Yes] [N/A:N/A] Cleansing: Odor Anticipated Due to No [N/A:N/A] Product Use: Wound Margin: [1:Thickened] [N/A:N/A] Granulation Amount: [1:Small (1-33%)] [N/A:N/A] Granulation Quality: [1:Pink] [N/A:N/A] Necrotic Amount: [1:Large (67-100%)] [N/A:N/A] Necrotic Tissue: [1:Eschar, Adherent Slough] [N/A:N/A] Exposed Structures: [1:Fat: Yes Fascia: No Tendon: No Muscle: No Joint: No Bone: No] [N/A:N/A] Epithelialization: [1:None] [N/A:N/A] Periwound Skin Texture: No Abnormalities Noted [N/A:N/A] Periwound Skin [1:Maceration: Yes] [N/A:N/A] Moisture: [1:Moist: Yes Dry/Scaly: No] Periwound Skin Color: No Abnormalities Noted N/A N/A Temperature: No Abnormality N/A N/A Tenderness on No N/A N/A Palpation: Wound Preparation: Ulcer Cleansing: N/A N/A Rinsed/Irrigated with Saline Topical Anesthetic Applied: Other: lidocaine 4% Treatment Notes Electronic Signature(s) Signed: 11/05/2015 4:39:38 PM By: Alejandro Mulling Entered By: Alejandro Mulling  on 11/05/2015 10:05:18 [name], [name] ([id number]) -------------------------------------------------------------------------------- Multi-Disciplinary Care Plan Details Patient Name: [name], [name] Date of Service: 11/05/2015 10:00 AM Medical Record Number: [id number] Patient Account Number: [id number] Date of Birth/Sex: 03-31-1971 (45 y.o. Male) Treating RN: Phillis Haggis Primary Care Physician: Other Clinician: Referring Physician: Treating Physician/Extender: Elayne Snare in Treatment: 11 Active Inactive Abuse / Safety / Falls / Self Care Management Nursing Diagnoses: Potential for falls Goals: Patient will remain injury free Date Initiated: 08/17/2015 Goal Status: Active Interventions: Assess fall risk on admission and as needed Notes: Nutrition Nursing Diagnoses: Imbalanced  nutrition Goals: Patient/caregiver agrees to and verbalizes understanding of need to use nutritional supplements and/or vitamins as prescribed Date Initiated: 08/17/2015 Goal Status: Active Interventions: Assess patient nutrition upon admission and as needed per policy Notes: Orientation to the Wound Care Program Nursing Diagnoses: Knowledge deficit related to the wound healing center program Goals: Patient/caregiver will verbalize understanding of the Wound Healing Center Program [name], [name] ([id number]) Date Initiated: 08/17/2015 Goal Status: Active Interventions: Provide education on orientation to the wound center Notes: Pressure Nursing Diagnoses: Knowledge deficit related to causes and risk factors for pressure ulcer development Goals: Patient will remain free from development of additional pressure ulcers Date Initiated: 08/17/2015 Goal Status: Active Interventions: Assess: immobility, friction, shearing, incontinence upon admission and as needed Notes: Wound/Skin Impairment Nursing Diagnoses: Impaired tissue integrity Goals: Ulcer/skin breakdown will have a volume reduction of 30% by week 4 Date Initiated: 08/17/2015 Goal Status: Active Ulcer/skin breakdown will have a volume reduction of 50% by week 8 Date Initiated: 08/17/2015 Goal Status: Active Ulcer/skin breakdown will have a volume reduction of 80% by week 12 Date Initiated: 08/17/2015 Goal Status: Active Interventions: Assess patient/caregiver ability to obtain necessary supplies Assess ulceration(s) every visit Notes: Electronic Signature(s) Signed: 11/05/2015 4:39:38 PM By: Servando Snare, Leonette Most ([id number]) Entered By: Alejandro Mulling on 11/05/2015 10:03:14 [name] ([id number]) -------------------------------------------------------------------------------- Pain Assessment Details Patient Name: [name] Date of Service: 11/05/2015 10:00 AM Medical Record Number:  [id number] Patient Account Number: [id number] Date of Birth/Sex: 1970/04/29 (45 y.o. Male) Treating RN: Phillis Haggis Primary Care Physician: Other Clinician: Referring Physician: Treating Physician/Extender: Elayne Snare in Treatment: 11 Active Problems Location of Pain Severity and Description of Pain Patient Has Paino No Site Locations With Dressing Change: No Pain Management and Medication Current Pain Management: Notes Topical or injectable lidocaine is offered to patient for acute pain when surgical debridement is performed. If needed, Patient is instructed to use over the counter pain medication for the following 24-48 hours after debridement. Wound care MDs do not prescribed pain medications. Electronic Signature(s) Signed: 11/05/2015 4:39:38 PM By: Alejandro Mulling Entered By: Alejandro Mulling on 11/05/2015 09:56:57 [name] ([id number]) -------------------------------------------------------------------------------- Patient/Caregiver Education Details Patient Name: [name] Date of Service: 11/05/2015 10:00 AM Medical Record Number: [id number] Patient Account Number: [id number] Date of Birth/Gender: April 11, 1971 (45 y.o. Male) Treating RN: Phillis Haggis Primary Care Physician: Other Clinician: Referring Physician: Treating Physician/Extender: Elayne Snare in Treatment: 11 Education Assessment Education Provided To: Patient Education Topics Provided Wound/Skin Impairment: Handouts: Other: change dressing as ordered Methods: Demonstration, Explain/Verbal Responses: State content correctly Electronic Signature(s) Signed: 11/05/2015 4:57:40 PM By: Ardath Sax MD Entered By: Ardath Sax on 11/05/2015 12:28:17 [name], [name] ([id number]) -------------------------------------------------------------------------------- Wound Assessment Details Patient Name: [name] Date of Service: 11/05/2015 10:00 AM Medical Record Number:  [id number] Patient Account Number: [id number] Date of Birth/Sex: Apr 13, 1971 (45 y.o. Male) Treating RN: Phillis Haggis Primary Care Physician: Other Clinician: Referring Physician: Treating Physician/Extender: Elayne Snare in  Treatment: 11 Wound Status Wound Number: 1 Primary Etiology: Pressure Ulcer Wound Location: Left Coccyx Wound Status: Open Wounding Event: Pressure Injury Comorbid History: Paraplegia Date Acquired: 12/24/2014 Weeks Of Treatment: 11 Clustered Wound: No Photos Photo Uploaded By: Alejandro MullingPinkerton, Debra on 11/05/2015 10:31:43 Wound Measurements Length: (cm) 0.4 Width: (cm) 1 Depth: (cm) 0.6 Area: (cm) 0.314 Volume: (cm) 0.188 % Reduction in Area: 70.4% % Reduction in Volume: 64.5% Epithelialization: None Tunneling: No Undermining: No Wound Description Classification: Category/Stage III Foul Odor Aft Wound Margin: Thickened Due to Produc Exudate Amount: Large Exudate Type: Serous Exudate Color: amber er Cleansing: Yes t Use: No Wound Bed Granulation Amount: Small (1-33%) Exposed Structure Granulation Quality: Pink Fascia Exposed: No Necrotic Amount: Large (67-100%) Fat Layer Exposed: Yes Necrotic Quality: Eschar, Adherent Slough Tendon Exposed: No [name], [name] ([id number]) Muscle Exposed: No Joint Exposed: No Bone Exposed: No Periwound Skin Texture Texture Color No Abnormalities Noted: No No Abnormalities Noted: No Moisture Temperature / Pain No Abnormalities Noted: No Temperature: No Abnormality Dry / Scaly: No Maceration: Yes Moist: Yes Wound Preparation Ulcer Cleansing: Rinsed/Irrigated with Saline Topical Anesthetic Applied: Other: lidocaine 4%, Treatment Notes Wound #1 (Left Coccyx) 1. Cleansed with: Clean wound with Normal Saline 2. Anesthetic Topical Lidocaine 4% cream to wound bed prior to debridement 3. Peri-wound Care: Skin Prep 4. Dressing Applied: Aquacel Ag 5. Secondary Dressing Applied Bordered Foam  Dressing Electronic Signature(s) Signed: 11/05/2015 4:39:38 PM By: Alejandro MullingPinkerton, Debra Entered By: Alejandro MullingPinkerton, Debra on 11/05/2015 10:02:17 [name], [name] ([id number]) -------------------------------------------------------------------------------- Vitals Details Patient Name: [name], [name] Date of Service: 11/05/2015 10:00 AM Medical Record Number: [id number] Patient Account Number: [id number] Date of Birth/Sex: 05/02/1970 42(45 y.o. Male) Treating RN: Phillis HaggisPinkerton, Debi Primary Care Physician: Other Clinician: Referring Physician: Treating Physician/Extender: Elayne SnarePARKER, PETER Weeks in Treatment: 11 Vital Signs Time Taken: 09:59 Temperature (F): 97.8 Height (in): 67 Pulse (bpm): 94 Weight (lbs): 147 Respiratory Rate (breaths/min): 20 Body Mass Index (BMI): 23 Blood Pressure (mmHg): 102/70 Reference Range: 80 - 120 mg / dl Electronic Signature(s) Signed: 11/05/2015 4:39:38 PM By: Alejandro MullingPinkerton, Debra Entered By: Alejandro MullingPinkerton, Debra on 11/05/2015 11:04:10

## 2015-11-06 NOTE — Progress Notes (Addendum)
KOHL, POLINSKY ([id number]) Visit Report for 11/05/2015 Chief Complaint Document Details Patient Name: [name], [name] Date of Service: 11/05/2015 10:00 AM Medical Record Number: [id number] Patient Account Number: [id number] Date of Birth/Sex: 1970-05-10 (45 y.o. Male) Treating RN: Phillis Haggis Primary Care Physician: Other Clinician: Referring Physician: Treating Physician/Extender: Elayne Snare in Treatment: 11 Information Obtained from: Patient Chief Complaint 08/17/15; the patient is recently really relocated from [facility]. He is here to establish a wound care for a stage III ulcer on his lower sacral area. Electronic Signature(s) Signed: 11/05/2015 12:24:32 PM By: Ardath Sax MD Entered By: Ardath Sax on 11/05/2015 12:24:32 DELAINE, CANTER ([id number]) -------------------------------------------------------------------------------- Debridement Details Patient Name: [name] Date of Service: 11/05/2015 10:00 AM Medical Record Number: [id number] Patient Account Number: [id number] Date of Birth/Sex: 1970/05/05 (45 y.o. Male) Treating RN: Phillis Haggis Primary Care Physician: Other Clinician: Referring Physician: Treating Physician/Extender: Elayne Snare in Treatment: 11 Debridement Performed for Wound #1 Left Coccyx Assessment: Performed By: Physician Ardath Sax, MD Debridement: Debridement Pre-procedure Yes Verification/Time Out Taken: Start Time: 10:06 Pain Control: Other : lidocaine 4% cream Level: Skin/Subcutaneous Tissue Total Area Debrided (L x 0.4 (cm) x 1 (cm) = 0.4 (cm) W): Tissue and other Viable, Non-Viable, Exudate, Fibrin/Slough, Subcutaneous material debrided: Instrument: Scissors Bleeding: Minimum Hemostasis Achieved: Pressure End Time: 10:08 Procedural Pain: 0 Post Procedural Pain: 0 Response to Treatment: Procedure was tolerated well Post Debridement Measurements of Total Wound Length: (cm) 0.4 Stage:  Category/Stage III Width: (cm) 1 Depth: (cm) 0.6 Volume: (cm) 0.188 Post Procedure Diagnosis Same as Pre-procedure Electronic Signature(s) Signed: 11/05/2015 4:39:38 PM By: Alejandro Mulling Signed: 11/05/2015 4:57:40 PM By: Ardath Sax MD Entered By: Alejandro Mulling on 11/05/2015 10:08:39 [name], [name] ([id number]) -------------------------------------------------------------------------------- HPI Details Patient Name: [name] Date of Service: 11/05/2015 10:00 AM Medical Record Number: [id number] Patient Account Number: [id number] Date of Birth/Sex: [birth year] (45 y.o. 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 He was diagnosed with what sounds like a stage III pressure ulcer in September 2016 although this may have been present for some time longer than that perhaps July 2016. He was followed by wound care in [place]. By his description there were using Silvadene cream for a period of time and then 3 months ago change to Aquacel Ag which she is continued and changes daily with help from his mother. He has recently relocated to [facility]. He went to urgent care to follow-up on his wound on 08/04/2015 they did a culture of this wound that showed Pseudomonas and MSSA. He was placed on an antibiotic but doesn't remember which one it is. He has continued using Aquacel Ag and a foam based cover. I don't have any of his records from the wound care center in [place] although the patient states he did have an MRI that did not show osteomyelitis. He is not aware of having a bone culture done and doesn't think that he had exposed bone on this and any point. The patient  does in and out cath's and has no urinary incontinence issues. He is on appropriate bowel regimen which she administers suppositories. He is able to position himself appropriately and is wheelchair to try and avoid pressure over this wound 08/24/15; small horizontal wound over his lower sacral area.  Requires debridement of surface slough and nonviable subcutaneous tissue. There is a small amount of undermining noted no evidence of infection. On the right side of this wound there is some senescent rolled edges which may need debridement I did not do this today. 09/01/15 this is a small wound over the lower sacral area. It has tunneling superiorly. This goes roughly 1 cm. Nonviable subcutaneous tissue at the inferior aspect of the wound base. No real evidence of infection 09/07/15; small horizontal wound over the lower sacral area. Last week it had 1 cm of superior tunneling which appears to of resolved. No evidence of infection 09/21/15; continues to have a small horizontal wound over the lower sacral area. We have been using Prisma and this has contracted. He tells me that his mother is aware medication not able to change the dressing although he states that he is able to do this 09/28/15; a small horizontal wound over his lower sacral area is all that is left of this wound which was probably much larger and deeper at one point. Depth today at 0.7 cm. Base of this had some nonviable subcutaneous tissue that I removed. Appears to be stalling on current dressing which is Prisma 10/19/15; I have not seen this patient in several weeks. He arrives today with a linear/horizontal wound across his lower sacral area. Considerable depth of this wound. Extensive debridement done Electronic Signature(s) Signed: 11/05/2015 12:24:39 PM By: Ardath Sax MD Entered By: Ardath Sax on 11/05/2015 12:24:39 [name], [name]  ([id number]) -------------------------------------------------------------------------------- Physical Exam Details Patient Name: [name] Date of Service: 11/05/2015 10:00 AM Medical Record Number: [id number] Patient Account Number: [id number] Date of Birth/Sex: 06/19/1970 (45 y.o. Male) Treating RN: Phillis Haggis Primary Care Physician: Other Clinician: Referring Physician: Treating Physician/Extender: Elayne Snare in Treatment: 11 Electronic Signature(s) Signed: 11/05/2015 12:24:47 PM By: Ardath Sax MD Entered By: Ardath Sax on 11/05/2015 12:24:47 [name], [name] ([id number]) -------------------------------------------------------------------------------- Physician Orders Details Patient Name: [name] Date of Service: 11/05/2015 10:00 AM Medical Record Number: [id number] Patient Account Number: [id number] Date of Birth/Sex: 08/28/1970 (45 y.o. Male) Treating RN: Phillis Haggis Primary Care Physician: Other Clinician: Referring Physician: Treating Physician/Extender: Elayne Snare in Treatment: 11 Verbal / Phone Orders: Yes ClinicianAshok Cordia, Debi Read Back and Verified: Yes Diagnosis Coding Wound Cleansing Wound #1 Left Coccyx o Clean wound with Normal Saline. Anesthetic Wound #1 Left Coccyx o Topical Lidocaine 4% cream applied to wound bed prior to debridement Skin Barriers/Peri-Wound Care Wound #1 Left Coccyx o Skin Prep Primary Wound Dressing o Aquacel Ag Secondary Dressing Wound #1 Left Coccyx o Boardered Foam Dressing - Optifoam Dressing Change Frequency Wound #1 Left Coccyx o Change dressing every other day. - unless there is a lot of drainage Follow-up Appointments Wound #1 Left Coccyx o Return Appointment in 2 weeks. Off-Loading Wound #1 Left Coccyx o Turn and reposition every 2 hours Additional Orders / Instructions Wound #1 Left Coccyx o Increase protein intake. [name], [name]  ([id number]) Electronic Signature(s) Signed: 11/05/2015 4:39:38 PM By: Alejandro Mulling Signed: 11/05/2015 4:57:40 PM By: Ardath Sax MD Entered By: Alejandro Mulling on 11/05/2015 10:26:50 [name], [name] ([id number]) -------------------------------------------------------------------------------- Problem List Details Patient Name: [name] Date of Service: 11/05/2015 10:00 AM Medical Record Number: [id number] Patient Account Number: [id number] Date of Birth/Sex: [birth year] (45 y.o. Male) Treating RN: Phillis Haggis Primary Care Physician: Other Clinician: Referring Physician: Treating Physician/Extender: Elayne Snare in Treatment: 11 Active Problems ICD-10 Encounter Code Description Active Date Diagnosis L89.153 Pressure ulcer of sacral region, stage 3  08/17/2015 Yes G82.22 Paraplegia, incomplete 08/17/2015 Yes Inactive Problems Resolved Problems Electronic Signature(s) Signed: 11/05/2015 12:24:21 PM By: Ardath Sax MD Entered By: Ardath Sax on 11/05/2015 12:24:21 [name], [name] ([id number]) -------------------------------------------------------------------------------- Progress Note Details Patient Name: [name] Date of Service: 11/05/2015 10:00 AM Medical Record Number: [id number] Patient Account Number: [id number] Date of Birth/Sex: 1970/05/09 (45 y.o. Male) Treating RN: Phillis Haggis Primary Care Physician: Other Clinician: Referring Physician: Treating Physician/Extender: Elayne Snare in Treatment: 11 Subjective Chief Complaint Information obtained from Patient 08/17/15; the patient is recently really relocated from [facility] He is here to establish a wound care for a stage III ulcer on his lower sacral area. 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 He was diagnosed with what sounds like a stage III  pressure ulcer in September 2016 although this may have been present for some time longer than that perhaps July 2016. He was [place] By his description there were using Silvadene cream for a period of time and then 3 months ago change to Aquacel Ag which she is continued and changes daily with help from his mother. He has recently relocated to [facility]. He went to urgent care to follow- up on his wound on 08/04/2015 they did a culture of this wound that showed Pseudomonas and MSSA. He was placed on an antibiotic but doesn't remember which one it is. He has continued using Aquacel Ag and a foam based cover. I don't have any of his records from the wound care center in [place] although the patient states he did have an MRI that did not show osteomyelitis. He is not aware of having a bone culture done and doesn't think that he had exposed bone on this and any point. The patient does in and out cath's and has no urinary incontinence issues. He is on appropriate bowel regimen which she administers suppositories. He is able to position himself appropriately and is wheelchair to try and avoid pressure over this wound 08/24/15; small horizontal wound over his lower sacral area. Requires debridement of surface slough and nonviable subcutaneous tissue. There is a small amount of undermining noted no evidence of infection. On the right side of this wound there is some senescent rolled edges which may need debridement I did not do this today. 09/01/15 this is a small wound over the lower sacral area. It has tunneling superiorly. This goes roughly 1 cm. Nonviable subcutaneous tissue at the inferior aspect of the wound base. No real evidence of infection 09/07/15; small horizontal wound over the lower sacral area. Last week it had 1 cm of superior tunneling which appears to of resolved. No evidence of infection 09/21/15; continues to have a small horizontal  wound over the lower sacral area. We have been using Prisma and this has contracted. He tells me that his mother is aware medication not able to change the dressing although he states that he is able to do this 09/28/15; a small horizontal wound over his lower sacral area is all that is left of this wound which was probably much larger and deeper at one point. Depth today at 0.7 cm. Base of this had some nonviable subcutaneous tissue that I removed. Appears to be stalling on current dressing which is Prisma 10/19/15; I have not seen this patient in several weeks. He arrives today with a linear/horizontal wound  across his lower sacral area. Considerable depth of this wound. Extensive debridement done Matthew EvangelistHARRIS, [name] ([id number]) Objective Constitutional Vitals Time Taken: 9:59 AM, Height: 67 in, Weight: 147 lbs, BMI: 23, Temperature: 97.8 F, Pulse: 94 bpm, Respiratory Rate: 20 breaths/min, Blood Pressure: 102/70 mmHg. Integumentary (Hair, Skin) Wound #1 status is Open. Original cause of wound was Pressure Injury. The wound is located on the Left Coccyx. The wound measures 0.4cm length x 1cm width x 0.6cm depth; 0.314cm^2 area and 0.188cm^3 volume. There is fat exposed. There is no tunneling or undermining noted. There is a large amount of serous drainage noted. The wound margin is thickened. There is small (1-33%) pink granulation within the wound bed. There is a large (67-100%) amount of necrotic tissue within the wound bed including Eschar and Adherent Slough. The periwound skin appearance exhibited: Maceration, Moist. The periwound skin appearance did not exhibit: Dry/Scaly. Periwound temperature was noted as No Abnormality. Assessment Active Problems ICD-10 L89.153 - Pressure ulcer of sacral region, stage 3 G82.22 - Paraplegia, incomplete Procedures Wound #1 Wound #1 is a Pressure Ulcer located on the Left Coccyx . There was a Skin/Subcutaneous Tissue Debridement ([id number])  debridement with total area of 0.4 sq cm performed by Ardath SaxPARKER, Kal Chait, MD. with the following instrument(s): Scissors to remove Viable and Non-Viable tissue/material including Exudate, Fibrin/Slough, and Subcutaneous after achieving pain control using Other (lidocaine 4% cream). A time out was conducted prior to the start of the procedure. A Minimum amount of bleeding was controlled with Pressure. The procedure was tolerated well with a pain level of 0 throughout and a pain level of 0 following the procedure. Post Debridement Measurements: 0.4cm length x 1cm width x 0.6cm depth; 0.188cm^3 volume. Post debridement Stage noted as Category/Stage III. Matthew EvangelistHARRIS, [name] ([id number]) Post procedure Diagnosis Wound #1: Same as Pre-Procedure Debrided some callous around the ulcer. Continue Prisma dressings daily. Plan Wound Cleansing: Wound #1 Left Coccyx: Clean wound with Normal Saline. Anesthetic: Wound #1 Left Coccyx: Topical Lidocaine 4% cream applied to wound bed prior to debridement Skin Barriers/Peri-Wound Care: Wound #1 Left Coccyx: Skin Prep Primary Wound Dressing: Aquacel Ag Secondary Dressing: Wound #1 Left Coccyx: Boardered Foam Dressing - Optifoam Dressing Change Frequency: Wound #1 Left Coccyx: Change dressing every other day. - unless there is a lot of drainage Follow-up Appointments: Wound #1 Left Coccyx: Return Appointment in 2 weeks. Off-Loading: Wound #1 Left Coccyx: Turn and reposition every 2 hours Additional Orders / Instructions: Wound #1 Left Coccyx: Increase protein intake. Follow-Up Appointments: A follow-up appointment should be scheduled. Medication Reconciliation completed and provided to Patient/Care Provider. Matthew EvangelistHARRIS, [name] ([id number]) Electronic Signature(s) Signed: 11/12/2015 11:41:40 AM By: Ardath SaxParker, Janette Harvie MD Previous Signature: 11/05/2015 12:27:14 PM Version By: Ardath SaxParker, Jamey Demchak MD Entered By: Ardath SaxParker, Ibraham Levi on 11/12/2015 11:41:40 Matthew EvangelistHARRIS, [name]  ([id number]) -------------------------------------------------------------------------------- SuperBill Details Patient Name: Matthew EvangelistHARRIS, Jamill Date of Service: 11/05/2015 Medical Record Number: [id number] Patient Account Number: [id number] Date of Birth/Sex: Jul 21, 1970 [age] y.o. Male) Treating RN: Phillis HaggisPinkerton, Debi Primary Care Physician: Other Clinician: Referring Physician: Treating Physician/Extender: Elayne SnarePARKER, Crysta Gulick Weeks in Treatment: 11 Diagnosis Coding ICD-10 Codes Code Description L89.153 Pressure ulcer of sacral region, stage 3 G82.22 Paraplegia, incomplete Facility Procedures CPT4 Code: [id number] Description: 11042 - DEB SUBQ TISSUE 20 SQ CM/< ICD-10 Description Diagnosis G82.22 Paraplegia, incomplete Modifier: Quantity: 1 Physician Procedures CPT4 Code: [id number] Description: 99213 - WC PHYS LEVEL 3 - EST PT ICD-10 Description Diagnosis L89.153 Pressure ulcer of sacral region, stage 3 Modifier: Quantity: 1 CPT4 Code: [id number] Description: 11042 - WC PHYS SUBQ TISS  20 SQ CM ICD-10 Description Diagnosis G82.22 Paraplegia, incomplete Modifier: Quantity: 1 Electronic Signature(s) Signed: 11/05/2015 12:27:48 PM By: Ardath Sax MD Entered By: Ardath Sax on 11/05/2015 12:27:47

## 2015-11-09 ENCOUNTER — Telehealth: Payer: Self-pay | Admitting: *Deleted

## 2015-11-09 NOTE — Telephone Encounter (Signed)
Patient calling back to report about the medications prescribed for muscle spasticity. He reports that the Baclofen 5mg  TID is making him very sleepy.  He feels a better result with the valium 5 mg.  He says this makes him tired for a short period but diminishes and is able to go on about his day.  So negative for baclofen, positive for valium.  He is asking if we could discontinue baclofen and increase valium TID....please advise

## 2015-11-10 MED ORDER — DIAZEPAM 5 MG PO TABS: 5.0000 mg | ORAL_TABLET | Freq: Three times a day (TID) | ORAL | Status: DC | PRN

## 2015-11-10 NOTE — Telephone Encounter (Signed)
Yes, we can do that.  Thanks!

## 2015-11-10 NOTE — Telephone Encounter (Signed)
Notified mr [name] and called diazepam to the pharmacy.

## 2015-11-17 ENCOUNTER — Other Ambulatory Visit
Admission: RE | Admit: 2015-11-17 | Discharge: 2015-11-17 | Disposition: A | Discharge status: Home / Self Care | Payer: Medicare Other | Source: Ambulatory Visit | Attending: Internal Medicine | Admitting: Internal Medicine

## 2015-11-17 ENCOUNTER — Encounter: Payer: Worker's Compensation | Admitting: Internal Medicine

## 2015-11-17 DIAGNOSIS — L89153 Pressure ulcer of sacral region, stage 3: Secondary | ICD-10-CM | POA: Diagnosis not present

## 2015-11-17 DIAGNOSIS — G8222 Paraplegia, incomplete: Secondary | ICD-10-CM | POA: Diagnosis not present

## 2015-11-17 DIAGNOSIS — L089 Local infection of the skin and subcutaneous tissue, unspecified: Secondary | ICD-10-CM | POA: Insufficient documentation

## 2015-11-18 NOTE — Progress Notes (Signed)
CORDELLE, Frank ([id number]) Visit Report for 11/17/2015 Chief Complaint Document Details Patient Name: [name], [name] Date of Service: 11/17/2015 10:45 AM Medical Record Patient Account Number: [id number] Number: Treating RN: Phillis Haggis [birth year] (45 y.o. Other Clinician: Date of Birth/Sex: Male) Treating ROBSON, MICHAEL Primary Care Physician: Physician/Extender: G Referring Physician: Weeks in Treatment: 13 Information Obtained from: Patient Chief Complaint 08/17/15; the patient is recently really relocated from [facility]. He is here to establish a wound care for a stage III ulcer on his lower sacral area. Electronic Signature(s) Signed: 11/17/2015 3:57:44 PM By: Baltazar Najjar MD Entered By: Baltazar Najjar on 11/17/2015 11:53:08 [name] ([id number]) -------------------------------------------------------------------------------- Debridement Details Patient Name: [name] Date of Service: 11/17/2015 10:45 AM Medical Record Patient Account Number: [id number] Number: Treating RN: Phillis Haggis [birth year] (45 y.o. Other Clinician: Date of Birth/Sex: Male) Treating ROBSON, MICHAEL Primary Care Physician: Physician/Extender: G Referring Physician: Weeks in Treatment: 13 Debridement Performed for Wound #1 Left Coccyx Assessment: Performed By: Physician Maxwell Caul, MD Debridement: Debridement Pre-procedure Yes Verification/Time Out Taken: Start Time: 11:35 Pain Control: Other : lidocaine 4% cream Level: Skin/Subcutaneous Tissue Total Area Debrided (L x 0.5 (cm) x 1.5 (cm) = 0.75 (cm) W): Tissue and other Viable, Non-Viable, Exudate, Fibrin/Slough, Subcutaneous material debrided: Instrument: Curette Bleeding: Minimum Hemostasis Achieved: Pressure End Time: 11:40 Procedural Pain: 0 Post Procedural Pain: 0 Response to Treatment: Procedure was tolerated well Post Debridement Measurements of Total  Wound Length: (cm) 0.5 Stage: Category/Stage III Width: (cm) 1.5 Depth: (cm) 0.8 Volume: (cm) 0.471 Post Procedure Diagnosis Same as Pre-procedure Electronic Signature(s) Signed: 11/17/2015 3:57:44 PM By: Baltazar Najjar MD Signed: 11/17/2015 4:36:40 PM By: Alejandro Mulling Entered By: Baltazar Najjar on 11/17/2015 11:52:53 [name] ([id number]) -------------------------------------------------------------------------------- HPI Details Patient Name: [name] Date of Service: 11/17/2015 10:45 AM Medical Record Patient Account Number: [id number] Number: Treating RN: Phillis Haggis [birth year] (45 y.o. 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 He was diagnosed with what sounds like a stage III pressure ulcer in September 2016 although this may have been present for some time longer than that perhaps July 2016. He was followed by wound care in [place]. By his description there were using Silvadene cream for a period of time and then 3 months ago change to Aquacel Ag which she is continued and changes daily with help from his mother. He has recently relocated to [facility] He went to urgent care to follow-up on his wound on 08/04/2015 they did a culture of this wound that showed Pseudomonas and MSSA. He was placed on an antibiotic but doesn't remember which one it is. He has continued using Aquacel Ag and a foam based cover. I don't have any of his records from the wound care center in [place] although the patient states he did have an MRI that did not show osteomyelitis. He is not aware of having a bone culture done and doesn't think that he had exposed  bone on this and any point. The patient does in and out cath's and has no urinary incontinence issues. He is on appropriate bowel regimen which she administers suppositories. He is able to position himself appropriately and is wheelchair to try and avoid pressure over this wound 08/24/15; small horizontal wound over  his lower sacral area. Requires debridement of surface slough and nonviable subcutaneous tissue. There is a small amount of undermining noted no evidence of infection. On the right side of this wound there is some senescent rolled edges which may need debridement I did not do this today. 09/01/15 this is a small wound over the lower sacral area. It has tunneling superiorly. This goes roughly 1 cm. Nonviable subcutaneous tissue at the inferior aspect of the wound base. No real evidence of infection 09/07/15; small horizontal wound over the lower sacral area. Last week it had 1 cm of superior tunneling which appears to of resolved. No evidence of infection 09/21/15; continues to have a small horizontal wound over the lower sacral area. We have been using Prisma and this has contracted. He tells me that his mother is aware medication not able to change the dressing although he states that he is able to do this 09/28/15; a small horizontal wound over his lower sacral area is all that is left of this wound which was probably much larger and deeper at one point. Depth today at 0.7 cm. Base of this had some nonviable subcutaneous tissue that I removed. Appears to be stalling on current dressing which is Prisma 10/19/15; I have not seen this patient in several weeks. He arrives today with a linear/horizontal wound across his lower sacral area. Considerable depth of this wound. Extensive debridement done 11/17/15; patient arrives for two-week visit. He is not doing well wound is bigger with odor and drainage. Has been using Aquacel Ag. I have not seen this in over a month although I think he was seen  by the physician covering me 2 weeks ago on 11/04/68 Electronic Signature(s) NALAN, CAPIZZI ([id number]) Signed: 11/17/2015 3:57:44 PM By: Baltazar Najjar MD Entered By: Baltazar Najjar on 11/17/2015 11:59:54 DAHNTAY, BALLEN ([id number]) -------------------------------------------------------------------------------- Physical Exam Details Patient Name: [name] Date of Service: 11/17/2015 10:45 AM Medical Record Patient Account Number: [id number] Number: Treating RN: Phillis Haggis 21-Jun-1970 (45 y.o. Other Clinician: Date of Birth/Sex: Male) Treating ROBSON, MICHAEL Primary Care Physician: Physician/Extender: G Referring Physician: Weeks in Treatment: 13 Notes Wound exam; indeed the wound is not been well. There is circumferential callus nonviable subcutaneous tissue. There is a faint odor. There is no overt surrounding erythema and no drainage was observed during my exam although her intake nurse reported this. A culture was done for CNS I'll await this before prescribing further antibiotics Electronic Signature(s) Signed: 11/17/2015 3:57:44 PM By: Baltazar Najjar MD Entered By: Baltazar Najjar on 11/17/2015 11:55:48 [name] ([id number]) -------------------------------------------------------------------------------- Physician Orders Details Patient Name: [name] Date of Service: 11/17/2015 10:45 AM Medical Record Patient Account Number: [id number] Number: Treating RN: Phillis Haggis 07-Feb-1971 (45 y.o. Other Clinician: Date of Birth/Sex: Male) Treating ROBSON, MICHAEL Primary Care Physician: Physician/Extender: G Referring Physician: Weeks in Treatment: 36 Verbal / Phone Orders: Yes ClinicianAshok Cordia, Debi Read Back and Verified: Yes Diagnosis Coding Wound Cleansing Wound #1 Left Coccyx o Clean wound with Normal Saline. Anesthetic Wound #1 Left Coccyx o Topical Lidocaine 4% cream applied to wound bed prior to  debridement Skin Barriers/Peri-Wound Care Wound #1 Left Coccyx o Skin Prep Primary Wound Dressing o Aquacel Ag Secondary Dressing Wound #1 Left Coccyx o Boardered Foam Dressing - Optifoam Dressing Change Frequency Wound #1 Left Coccyx o Change dressing every other day. - unless there is a lot of drainage Follow-up Appointments Wound #1 Left Coccyx o Return Appointment in 2 weeks. Off-Loading Wound #1 Left Coccyx o Turn  and reposition every 2 hours Additional Orders / Instructions Wound #1 Left Coccyx VICTORY, STROLLO ([id number]) o Increase protein intake. Laboratory o Bacteria identified in Wound by Culture (MICRO) - left coccyx oooo LOINC Code: 6462-6 oooo Convenience Name: Wound culture routine Electronic Signature(s) Signed: 11/17/2015 3:57:44 PM By: Baltazar Najjar MD Signed: 11/17/2015 4:36:40 PM By: Alejandro Mulling Entered By: Alejandro Mulling on 11/17/2015 12:55:43 SYRUS, NAKAMA ([id number]) -------------------------------------------------------------------------------- Problem List Details Patient Name: [name] Date of Service: 11/17/2015 10:45 AM Medical Record Patient Account Number: [id number] Number: Treating RN: Phillis Haggis [birth year] (45 y.o. Other Clinician: Date of Birth/Sex: Male) Treating ROBSON, MICHAEL Primary Care Physician: Physician/Extender: G Referring Physician: Weeks in Treatment: 13 Active Problems ICD-10 Encounter Code Description Active Date Diagnosis L89.153 Pressure ulcer of sacral region, stage 3 08/17/2015 Yes G82.22 Paraplegia, incomplete 08/17/2015 Yes Inactive Problems Resolved Problems Electronic Signature(s) Signed: 11/17/2015 3:57:44 PM By: Baltazar Najjar MD Entered By: Baltazar Najjar on 11/17/2015 11:47:11 [name] ([id number]) -------------------------------------------------------------------------------- Progress Note Details Patient Name: [name] Date of Service:  11/17/2015 10:45 AM Medical Record Patient Account Number: [id number] Number: Treating RN: Phillis Haggis [birth year] (45 y.o. Other Clinician: Date of Birth/Sex: Male) Treating ROBSON, MICHAEL Primary Care Physician: Physician/Extender: G Referring Physician: Weeks in Treatment: 13 Subjective Chief Complaint Information obtained from Patient 08/17/15; the patient is recently really relocated from [facility]. He is here to establish a wound care for a stage III ulcer on his lower sacral area. 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 He was diagnosed with what sounds like a stage III pressure ulcer in September 2016 although this may have been present for some time longer than that perhaps July 2016. He was followed by wound care in [place]. By his description there were using Silvadene cream for a period of time and then 3 months ago change to Aquacel Ag which she is continued and changes daily with help from his mother. He has recently relocated to [facility]. He went to urgent care to follow- up on his wound on 08/04/2015 they did a culture of this wound that showed Pseudomonas and MSSA. He was placed on an antibiotic but doesn't remember which one it is. He has continued using Aquacel Ag and a foam based cover. I don't have any of his records from the wound care center in [place] although the patient states he did have an MRI that did not show osteomyelitis. He is not aware of having a bone culture done and doesn't think that he had exposed bone on this and any point. The patient does in and out cath's and has no urinary incontinence issues. He is on appropriate bowel regimen which she administers suppositories. He is able to position himself appropriately and is wheelchair to try and avoid pressure over this wound 08/24/15;  small horizontal wound over his lower sacral area. Requires debridement of surface slough and nonviable subcutaneous tissue. There is a small amount of undermining noted no evidence of infection. On the right side of this wound there is some senescent rolled edges which may need debridement I did not do this today. 09/01/15 this is a small wound over the lower sacral area. It has tunneling superiorly. This goes roughly 1 cm. Nonviable subcutaneous tissue at the inferior aspect of the wound base. No real evidence of infection 09/07/15; small horizontal wound over the lower sacral area.  Last week it had 1 cm of superior tunneling which appears to of resolved. No evidence of infection 09/21/15; continues to have a small horizontal wound over the lower sacral area. We have been using Prisma and this has contracted. He tells me that his mother is aware medication not able to change the dressing although he states that he is able to do this 09/28/15; a small horizontal wound over his lower sacral area is all that is left of this wound which was probably much larger and deeper at one point. Depth today at 0.7 cm. Base of this had some nonviable subcutaneous tissue that I removed. Appears to be stalling on current dressing which is Prisma 10/19/15; I have not seen this patient in several weeks. He arrives today with a linear/horizontal wound JOHNATHEN, TESTA ([id number]) across his lower sacral area. Considerable depth of this wound. Extensive debridement done 11/17/15; patient arrives for two-week visit. He is not doing well wound is bigger with odor and drainage. Has been using Aquacel Ag Objective Constitutional Vitals Time Taken: 11:13 AM, Height: 67 in, Weight: 147 lbs, BMI: 23, Temperature: 97.9 F, Pulse: 92 bpm, Respiratory Rate: 20 breaths/min, Blood Pressure: 93/65 mmHg. Integumentary (Hair, Skin) Wound #1 status is Open. Original cause of wound was Pressure Injury. The wound is located on the  Left Coccyx. The wound measures 0.5cm length x 1.5cm width x 0.7cm depth; 0.589cm^2 area and 0.412cm^3 volume. There is fat exposed. There is no tunneling noted, however, there is undermining starting at 9:00 and ending at :00 with a maximum distance of 0.6cm. There is a large amount of serous drainage noted. The wound margin is thickened. There is small (1-33%) pink granulation within the wound bed. There is a large (67-100%) amount of necrotic tissue within the wound bed including Eschar and Adherent Slough. The periwound skin appearance exhibited: Maceration, Moist. The periwound skin appearance did not exhibit: Dry/Scaly. Periwound temperature was noted as No Abnormality. Assessment Active Problems ICD-10 L89.153 - Pressure ulcer of sacral region, stage 3 G82.22 - Paraplegia, incomplete Procedures Wound #1 Wound #1 is a Pressure Ulcer located on the Left Coccyx . There was a Skin/Subcutaneous Tissue Debridement ([id number]) debridement with total area of 0.75 sq cm performed by Maxwell Caul, MD. with the following instrument(s): Curette to remove Viable and Non-Viable tissue/material including Exudate, Fibrin/Slough, and Subcutaneous after achieving pain control using Other (lidocaine 4% cream). TAVARI, LOADHOLT ([id number]) time out was conducted prior to the start of the procedure. A Minimum amount of bleeding was controlled with Pressure. The procedure was tolerated well with a pain level of 0 throughout and a pain level of 0 following the procedure. Post Debridement Measurements: 0.5cm length x 1.5cm width x 0.8cm depth; 0.471cm^3 volume. Post debridement Stage noted as Category/Stage III. Post procedure Diagnosis Wound #1: Same as Pre-Procedure Plan Wound Cleansing: Wound #1 Left Coccyx: Clean wound with Normal Saline. Anesthetic: Wound #1 Left Coccyx: Topical Lidocaine 4% cream applied to wound bed prior to debridement Skin Barriers/Peri-Wound Care: Wound #1 Left  Coccyx: Skin Prep Primary Wound Dressing: Aquacel Ag Secondary Dressing: Wound #1 Left Coccyx: Boardered Foam Dressing - Optifoam Dressing Change Frequency: Wound #1 Left Coccyx: Change dressing every other day. - unless there is a lot of drainage Follow-up Appointments: Wound #1 Left Coccyx: Return Appointment in 2 weeks. Off-Loading: Wound #1 Left Coccyx: Turn and reposition every 2 hours Additional Orders / Instructions: Wound #1 Left Coccyx: Increase protein intake. #1 for now I'm going to continue with  the Aquacel Ag. #2 I think most of this is due to an adequate pressure relief in his wheelchair even though he has wheelchair cushion, there is a marked indentation in the condition where this wound would be placed. WILBURN, KEIR ([id number]) have talked about this with the patient he is going to attempt to spend more time in bed when he doesn't need to be up in the chair. #3 await culture results before further antibiotics Electronic Signature(s) Signed: 11/17/2015 3:57:44 PM By: Baltazar Najjar MD Entered By: Baltazar Najjar on 11/17/2015 11:58:42 [name] ([id number]) -------------------------------------------------------------------------------- SuperBill Details Patient Name: [name] Date of Service: 11/17/2015 Medical Record Patient Account Number: [id number] Number: Treating RN: Phillis Haggis 06-28-1970 (45 y.o. Other Clinician: Date of Birth/Sex: Male) Treating ROBSON, MICHAEL Primary Care Physician: Physician/Extender: G Referring Physician: Weeks in Treatment: 13 Diagnosis Coding ICD-10 Codes Code Description L89.153 Pressure ulcer of sacral region, stage 3 G82.22 Paraplegia, incomplete Facility Procedures CPT4 Code: [id number] Description: 11042 - DEB SUBQ TISSUE 20 SQ CM/< ICD-10 Description Diagnosis L89.153 Pressure ulcer of sacral region, stage 3 Modifier: Quantity: 1 Physician Procedures CPT4 Code:  [id number] Description: 11042 - WC PHYS SUBQ TISS 20 SQ CM ICD-10 Description Diagnosis L89.153 Pressure ulcer of sacral region, stage 3 Modifier: Quantity: 1 Electronic Signature(s) Signed: 11/17/2015 3:57:44 PM By: Baltazar Najjar MD Entered By: Baltazar Najjar on 11/17/2015 11:59:26

## 2015-11-18 NOTE — Progress Notes (Signed)
Matthew, Frank ([id number]) Visit Report for 11/17/2015 Arrival Information Details Patient Name: [name], [name] Date of Service: 11/17/2015 10:45 AM Medical Record Number: [id number] Patient Account Number: [id number] Date of Birth/Sex: 02/14/1971 (45 y.o. Male) Treating RN: Phillis Haggis Primary Care Physician: Other Clinician: Referring Physician: Treating Physician/Extender: Altamese [place] in Treatment: 13 Visit Information History Since Last Visit All ordered tests and consults were completed: No Patient Arrived: Wheel Chair Added or deleted any medications: No Arrival Time: 11:12 Any new allergies or adverse reactions: No Accompanied By: self Had a fall or experienced change in No activities of daily living that may affect Transfer Assistance: None risk of falls: Patient Identification Verified: Yes Signs or symptoms of abuse/neglect since last No Secondary Verification Process Yes visito Completed: Hospitalized since last visit: No Patient Requires Transmission-Based No Pain Present Now: No Precautions: Patient Has Alerts: No Electronic Signature(s) Signed: 11/17/2015 4:36:40 PM By: Alejandro Mulling Entered By: Alejandro Mulling on 11/17/2015 11:12:52 [name] ([id number]) -------------------------------------------------------------------------------- Encounter Discharge Information Details Patient Name: [name] Date of Service: 11/17/2015 10:45 AM Medical Record Number: [id number] Patient Account Number: [id number] Date of Birth/Sex: [birth year] (45 y.o. Male) Treating RN: Phillis Haggis Primary Care Physician: Other Clinician: Referring Physician: Treating Physician/Extender: Altamese [place] in Treatment: 13 Encounter Discharge Information Items Discharge Pain Level: 0 Discharge Condition: Stable Ambulatory Status: Wheelchair Discharge Destination: Home Transportation: Private Auto Accompanied By: self Schedule Follow-up  Appointment: Yes Medication Reconciliation completed and provided to Patient/Care Yes Shann Lewellyn: Provided on Clinical Summary of Care: 11/17/2015 Form Type Recipient Paper Patient [facility] Electronic Signature(s) Signed: 11/17/2015 11:48:54 AM By: Gwenlyn Perking Entered By: Gwenlyn Perking on 11/17/2015 11:48:54 [name] ([id number]) -------------------------------------------------------------------------------- Lower Extremity Assessment Details Patient Name: [name] Date of Service: 11/17/2015 10:45 AM Medical Record Number: [id number] Patient Account Number: [id number] Date of Birth/Sex: [birth year] (45 y.o. Male) Treating RN: Phillis Haggis Primary Care Physician: Other Clinician: Referring Physician: Treating Physician/Extender: Maxwell Caul Weeks in Treatment: 13 Electronic Signature(s) Signed: 11/17/2015 4:36:40 PM By: Alejandro Mulling Entered By: Alejandro Mulling on 11/17/2015 11:13:30 [name] ([id number]) -------------------------------------------------------------------------------- Multi Wound Chart Details Patient Name: [name] Date of Service: 11/17/2015 10:45 AM Medical Record Number: [id number] Patient Account Number: [id number] Date of Birth/Sex: June 28, 1970 (45 y.o. Male) Treating RN: Phillis Haggis Primary Care Physician: Other Clinician: Referring Physician: Treating Physician/Extender: Maxwell Caul Weeks in Treatment: 13 Vital Signs Height(in): 67 Pulse(bpm): 92 Weight(lbs): 147 Blood Pressure 93/65 (mmHg): Body Mass Index(BMI): 23 Temperature(F): 97.9 Respiratory Rate 20 (breaths/min): Photos: [1:No Photos] [N/A:N/A] Wound Location: [1:Left Coccyx] [N/A:N/A] Wounding Event: [1:Pressure Injury] [N/A:N/A] Primary Etiology: [1:Pressure Ulcer] [N/A:N/A] Comorbid History: [1:Paraplegia] [N/A:N/A] Date Acquired: [1:12/24/2014] [N/A:N/A] Weeks of Treatment: [1:13] [N/A:N/A] Wound Status: [1:Open] [N/A:N/A] Measurements L  x W x D 0.5x1.5x0.7 [N/A:N/A] (cm) Area (cm) : [1:0.589] [N/A:N/A] Volume (cm) : [1:0.412] [N/A:N/A] % Reduction in Area: [1:44.40%] [N/A:N/A] % Reduction in Volume: 22.30% [N/A:N/A] Starting Position 1 [1:9] (o'clock): Ending Position 1 (o'clock): Maximum Distance 1 0.6 (cm): Undermining: [1:Yes] [N/A:N/A] Classification: [1:Category/Stage III] [N/A:N/A] Exudate Amount: [1:Large] [N/A:N/A] Exudate Type: [1:Serous] [N/A:N/A] Exudate Color: [1:amber] [N/A:N/A] Foul Odor After [1:Yes] [N/A:N/A] Cleansing: Odor Anticipated Due to No [N/A:N/A] Product Use: Wound Margin: [1:Thickened] [N/A:N/A] Granulation Amount: Small (1-33%) N/A N/A Granulation Quality: Pink N/A N/A Necrotic Amount: Large (67-100%) N/A N/A Necrotic Tissue: Eschar, Adherent Slough N/A N/A Exposed Structures: Fat: Yes N/A N/A Fascia: No Tendon: No Muscle: No Joint: No Bone: No Epithelialization: None N/A N/A Periwound Skin Texture: No Abnormalities Noted N/A N/A Periwound  Skin Maceration: Yes N/A N/A Moisture: Moist: Yes Dry/Scaly: No Periwound Skin Color: No Abnormalities Noted N/A N/A Temperature: No Abnormality N/A N/A Tenderness on No N/A N/A Palpation: Wound Preparation: Ulcer Cleansing: N/A N/A Rinsed/Irrigated with Saline Topical Anesthetic Applied: Other: lidocaine 4% Treatment Notes Electronic Signature(s) Signed: 11/17/2015 4:36:40 PM By: Alejandro Mulling Entered By: Alejandro Mulling on 11/17/2015 11:21:07 [name] ([id number]) -------------------------------------------------------------------------------- Multi-Disciplinary Care Plan Details Patient Name: [name] Date of Service: 11/17/2015 10:45 AM Medical Record Number: [id number] Patient Account Number: [id number] Date of Birth/Sex: Jan 04, 1971 (45 y.o. Male) Treating RN: Phillis Haggis Primary Care Physician: Other Clinician: Referring Physician: Treating Physician/Extender: Altamese [place]ata in  Treatment: 13 Active Inactive Abuse / Safety / Falls / Self Care Management Nursing Diagnoses: Potential for falls Goals: Patient will remain injury free Date Initiated: 08/17/2015 Goal Status: Active Interventions: Assess fall risk on admission and as needed Notes: Nutrition Nursing Diagnoses: Imbalanced nutrition Goals: Patient/caregiver agrees to and verbalizes understanding of need to use nutritional supplements and/or vitamins as prescribed Date Initiated: 08/17/2015 Goal Status: Active Interventions: Assess patient nutrition upon admission and as needed per policy Notes: Orientation to the Wound Care Program Nursing Diagnoses: Knowledge deficit related to the wound healing center program Goals: Patient/caregiver will verbalize understanding of the Wound Healing Center Program HAYDER, JUENEMANN ([id number]) Date Initiated: 08/17/2015 Goal Status: Active Interventions: Provide education on orientation to the wound center Notes: Pressure Nursing Diagnoses: Knowledge deficit related to causes and risk factors for pressure ulcer development Goals: Patient will remain free from development of additional pressure ulcers Date Initiated: 08/17/2015 Goal Status: Active Interventions: Assess: immobility, friction, shearing, incontinence upon admission and as needed Notes: Wound/Skin Impairment Nursing Diagnoses: Impaired tissue integrity Goals: Ulcer/skin breakdown will have a volume reduction of 30% by week 4 Date Initiated: 08/17/2015 Goal Status: Active Ulcer/skin breakdown will have a volume reduction of 50% by week 8 Date Initiated: 08/17/2015 Goal Status: Active Ulcer/skin breakdown will have a volume reduction of 80% by week 12 Date Initiated: 08/17/2015 Goal Status: Active Interventions: Assess patient/caregiver ability to obtain necessary supplies Assess ulceration(s) every visit Notes: Electronic Signature(s) Signed: 11/17/2015 4:36:40 PM By: Servando Snare, Leonette Most ([id number]) Entered By: Alejandro Mulling on 11/17/2015 11:20:59 [name] ([id number]) -------------------------------------------------------------------------------- Pain Assessment Details Patient Name: [name] Date of Service: 11/17/2015 10:45 AM Medical Record Number: [id number] Patient Account Number: [id number] Date of Birth/Sex: [birth year] (45 y.o. Male) Treating RN: Phillis Haggis Primary Care Physician: Other Clinician: Referring Physician: Treating Physician/Extender: Altamese [place] in Treatment: 13 Active Problems Location of Pain Severity and Description of Pain Patient Has Paino No Site Locations With Dressing Change: No Pain Management and Medication Current Pain Management: Electronic Signature(s) Signed: 11/17/2015 4:36:40 PM By: Alejandro Mulling Entered By: Alejandro Mulling on 11/17/2015 11:12:59 [name] ([id number]) -------------------------------------------------------------------------------- Patient/Caregiver Education Details Patient Name: [name] Date of Service: 11/17/2015 10:45 AM Medical Record Number: [id number] Patient Account Number: [id number] Date of Birth/Gender: 1970-10-23 (45 y.o. Male) Treating RN: Phillis Haggis Primary Care Physician: Other Clinician: Referring Physician: Treating Physician/Extender: Altamese [place] in Treatment: 13 Education Assessment Education Provided To: Patient Education Topics Provided Wound/Skin Impairment: Handouts: Other: change dressing as ordered Methods: Demonstration, Explain/Verbal Responses: State content correctly Electronic Signature(s) Signed: 11/17/2015 4:36:40 PM By: Alejandro Mulling Entered By: Alejandro Mulling on 11/17/2015 11:40:31 [name] ([id number]) -------------------------------------------------------------------------------- Wound Assessment Details Patient Name: [name] Date of Service: 11/17/2015  10:45 AM Medical Record Number: [id number] Patient Account Number: [id number] Date of Birth/Sex: 1970/07/17 (45 y.o. Male)  Treating RN: Phillis Haggis Primary Care Physician: Other Clinician: Referring Physician: Treating Physician/Extender: Altamese [place] in Treatment: 13 Wound Status Wound Number: 1 Primary Etiology: Pressure Ulcer Wound Location: Left Coccyx Wound Status: Open Wounding Event: Pressure Injury Comorbid History: Paraplegia Date Acquired: 12/24/2014 Weeks Of Treatment: 13 Clustered Wound: No Photos Photo Uploaded By: Alejandro Mulling on 11/17/2015 16:32:03 Wound Measurements Length: (cm) 0.5 % Reduction i Width: (cm) 1.5 % Reduction i Depth: (cm) 0.7 Epithelializa Area: (cm) 0.589 Tunneling: Volume: (cm) 0.412 Undermining: Starting P Maximum Di n Area: 44.4% n Volume: 22.3% tion: None No Yes osition (o'clock): 9 stance: (cm) 0.6 Wound Description Classification: Category/Stage III Foul Odor Af Wound Margin: Thickened Due to Produ Exudate Amount: Large Exudate Type: Serous Exudate Color: amber ter Cleansing: Yes ct Use: No Wound Bed Granulation Amount: Small (1-33%) Exposed Structure Dassow, Damier ([id number]) Granulation Quality: Pink Fascia Exposed: No Necrotic Amount: Large (67-100%) Fat Layer Exposed: Yes Necrotic Quality: Eschar, Adherent Slough Tendon Exposed: No Muscle Exposed: No Joint Exposed: No Bone Exposed: No Periwound Skin Texture Texture Color No Abnormalities Noted: No No Abnormalities Noted: No Moisture Temperature / Pain No Abnormalities Noted: No Temperature: No Abnormality Dry / Scaly: No Maceration: Yes Moist: Yes Wound Preparation Ulcer Cleansing: Rinsed/Irrigated with Saline Topical Anesthetic Applied: Other: lidocaine 4%, Treatment Notes Wound #1 (Left Coccyx) 1. Cleansed with: Clean wound with Normal Saline 2. Anesthetic Topical Lidocaine 4% cream to wound bed prior to debridement 3.  Peri-wound Care: Skin Prep 4. Dressing Applied: Aquacel Ag 5. Secondary Dressing Applied Bordered Foam Dressing Electronic Signature(s) Signed: 11/17/2015 4:36:40 PM By: Alejandro Mulling Entered By: Alejandro Mulling on 11/17/2015 11:19:52 DIANGELO, RADEL ([id number]) -------------------------------------------------------------------------------- Vitals Details Patient Name: [name] Date of Service: 11/17/2015 10:45 AM Medical Record Number: [id number] Patient Account Number: [id number] Date of Birth/Sex: [birth year] (45 y.o. Male) Treating RN: Phillis Haggis Primary Care Physician: Other Clinician: Referring Physician: Treating Physician/Extender: Altamese  in Treatment: 13 Vital Signs Time Taken: 11:13 Temperature (F): 97.9 Height (in): 67 Pulse (bpm): 92 Weight (lbs): 147 Respiratory Rate (breaths/min): 20 Body Mass Index (BMI): 23 Blood Pressure (mmHg): 93/65 Reference Range: 80 - 120 mg / dl Electronic Signature(s) Signed: 11/17/2015 4:36:40 PM By: Alejandro Mulling Entered By: Alejandro Mulling on 11/17/2015 11:13:19

## 2015-11-20 LAB — AEROBIC CULTURE  (SUPERFICIAL SPECIMEN)

## 2015-11-20 LAB — AEROBIC CULTURE W GRAM STAIN (SUPERFICIAL SPECIMEN)

## 2015-11-24 ENCOUNTER — Encounter: Payer: Worker's Compensation | Attending: Internal Medicine | Admitting: Internal Medicine

## 2015-11-24 DIAGNOSIS — Z8249 Family history of ischemic heart disease and other diseases of the circulatory system: Secondary | ICD-10-CM | POA: Diagnosis not present

## 2015-11-24 DIAGNOSIS — L89153 Pressure ulcer of sacral region, stage 3: Secondary | ICD-10-CM | POA: Diagnosis not present

## 2015-11-24 DIAGNOSIS — G8222 Paraplegia, incomplete: Secondary | ICD-10-CM | POA: Diagnosis not present

## 2015-11-24 DIAGNOSIS — F17210 Nicotine dependence, cigarettes, uncomplicated: Secondary | ICD-10-CM | POA: Diagnosis not present

## 2015-11-25 ENCOUNTER — Encounter (HOSPITAL_COMMUNITY): Payer: Self-pay

## 2015-11-25 ENCOUNTER — Emergency Department (HOSPITAL_COMMUNITY)
Admission: EM | Admit: 2015-11-25 | Discharge: 2015-11-26 | Disposition: A | Discharge status: Home / Self Care | Payer: Medicare Other | Attending: Emergency Medicine | Admitting: Emergency Medicine

## 2015-11-25 DIAGNOSIS — F17210 Nicotine dependence, cigarettes, uncomplicated: Secondary | ICD-10-CM | POA: Insufficient documentation

## 2015-11-25 DIAGNOSIS — Z792 Long term (current) use of antibiotics: Secondary | ICD-10-CM | POA: Diagnosis not present

## 2015-11-25 DIAGNOSIS — R451 Restlessness and agitation: Secondary | ICD-10-CM | POA: Diagnosis not present

## 2015-11-25 DIAGNOSIS — Z79899 Other long term (current) drug therapy: Secondary | ICD-10-CM | POA: Insufficient documentation

## 2015-11-25 DIAGNOSIS — F29 Unspecified psychosis not due to a substance or known physiological condition: Secondary | ICD-10-CM | POA: Diagnosis not present

## 2015-11-25 DIAGNOSIS — F99 Mental disorder, not otherwise specified: Secondary | ICD-10-CM | POA: Diagnosis not present

## 2015-11-25 NOTE — ED Notes (Signed)
Pt is IVCd by his mother because she says that he's been making threats about killing her, he denies this and states that she's upset because they had an argument about him being out drinking with his sister earlier in the week.

## 2015-11-25 NOTE — Progress Notes (Signed)
[name], [name] ([id number]) Visit Report for 11/24/2015 Arrival Information Details Patient Name: [name], [name] Date of Service: 11/24/2015 8:45 AM Medical Record Number: [id number] Patient Account Number: [id number] Date of Birth/Sex: Feb 12, 1971 (45 y.o. Male) Treating RN: Phillis Haggis Primary Care Physician: Other Clinician: Referring Physician: Treating Physician/Extender: Altamese [place] in Treatment: 14 Visit Information History Since Last Visit All ordered tests and consults were completed: No Patient Arrived: Wheel Chair Added or deleted any medications: No Arrival Time: 09:31 Any new allergies or adverse reactions: No Accompanied By: self Had a fall or experienced change in No activities of daily living that may affect Transfer Assistance: None risk of falls: Patient Identification Verified: Yes Signs or symptoms of abuse/neglect since last No Secondary Verification Process Yes visito Completed: Hospitalized since last visit: No Patient Requires Transmission-Based No Pain Present Now: No Precautions: Patient Has Alerts: No Electronic Signature(s) Signed: 11/24/2015 4:44:15 PM By: Alejandro Mulling Entered By: Alejandro Mulling on 11/24/2015 09:32:31 [name] ([id number]) -------------------------------------------------------------------------------- Encounter Discharge Information Details Patient Name: [name] Date of Service: 11/24/2015 8:45 AM Medical Record Number: [id number] Patient Account Number: [id number] Date of Birth/Sex: [birth year] (45 y.o. Male) Treating RN: Phillis Haggis Primary Care Physician: Other Clinician: Referring Physician: Treating Physician/Extender: Altamese [place] in Treatment: 14 Encounter Discharge Information Items Discharge Pain Level: 0 Discharge Condition: Stable Ambulatory Status: Wheelchair Discharge Destination: Home Transportation: Private Auto Accompanied By: self Schedule Follow-up  Appointment: Yes Medication Reconciliation completed Yes and provided to Patient/Care Staria Birkhead: Provided on Clinical Summary of Care: 11/24/2015 Form Type Recipient Paper Patient [facility] Electronic Signature(s) Signed: 11/24/2015 9:50:34 AM By: Gwenlyn Perking Entered By: Gwenlyn Perking on 11/24/2015 09:50:34 [name] ([id number]) -------------------------------------------------------------------------------- Lower Extremity Assessment Details Patient Name: [name] Date of Service: 11/24/2015 8:45 AM Medical Record Number: [id number] Patient Account Number: [id number] Date of Birth/Sex: 03/04/1971 (45 y.o. Male) Treating RN: Phillis Haggis Primary Care Physician: Other Clinician: Referring Physician: Treating Physician/Extender: Maxwell Caul Weeks in Treatment: 14 Electronic Signature(s) Signed: 11/24/2015 4:44:15 PM By: Alejandro Mulling Entered By: Alejandro Mulling on 11/24/2015 09:33:03 [name] ([id number]) -------------------------------------------------------------------------------- Multi Wound Chart Details Patient Name: [name] Date of Service: 11/24/2015 8:45 AM Medical Record Number: [id number] Patient Account Number: [id number] Date of Birth/Sex: January 29, 1971 (45 y.o. Male) Treating RN: Phillis Haggis Primary Care Physician: Other Clinician: Referring Physician: Treating Physician/Extender: Maxwell Caul Weeks in Treatment: 14 Vital Signs Height(in): 67 Pulse(bpm): 79 Weight(lbs): 147 Blood Pressure 92/38 (mmHg): Body Mass Index(BMI): 23 Temperature(F): 97.6 Respiratory Rate 20 (breaths/min): Photos: [1:No Photos] [N/A:N/A] Wound Location: [1:Left Coccyx] [N/A:N/A] Wounding Event: [1:Pressure Injury] [N/A:N/A] Primary Etiology: [1:Pressure Ulcer] [N/A:N/A] Comorbid History: [1:Paraplegia] [N/A:N/A] Date Acquired: [1:12/24/2014] [N/A:N/A] Weeks of Treatment: [1:14] [N/A:N/A] Wound Status: [1:Open] [N/A:N/A] Measurements L x W x D  0.4x1.6x0.4 [N/A:N/A] (cm) Area (cm) : [1:0.503] [N/A:N/A] Volume (cm) : [1:0.201] [N/A:N/A] % Reduction in Area: [1:52.50%] [N/A:N/A] % Reduction in Volume: 62.10% [N/A:N/A] Starting Position 1 [1:9] (o'clock): Ending Position 1 (o'clock): Maximum Distance 1 0.4 (cm): Undermining: [1:Yes] [N/A:N/A] Classification: [1:Category/Stage III] [N/A:N/A] Exudate Amount: [1:Large] [N/A:N/A] Exudate Type: [1:Purulent] [N/A:N/A] Exudate Color: [1:yellow, brown, green] [N/A:N/A] Foul Odor After [1:Yes] [N/A:N/A] Cleansing: Odor Anticipated Due to No [N/A:N/A] Product Use: Wound Margin: [1:Thickened] [N/A:N/A] Granulation Amount: Small (1-33%) N/A N/A Granulation Quality: Pink N/A N/A Necrotic Amount: Large (67-100%) N/A N/A Necrotic Tissue: Eschar, Adherent Slough N/A N/A Exposed Structures: Fat: Yes N/A N/A Fascia: No Tendon: No Muscle: No Joint: No Bone: No Epithelialization: None N/A N/A Periwound Skin Texture: No Abnormalities Noted N/A  N/A Periwound Skin Maceration: Yes N/A N/A Moisture: Moist: Yes Dry/Scaly: No Periwound Skin Color: No Abnormalities Noted N/A N/A Temperature: No Abnormality N/A N/A Tenderness on No N/A N/A Palpation: Wound Preparation: Ulcer Cleansing: N/A N/A Rinsed/Irrigated with Saline Topical Anesthetic Applied: Other: lidocaine 4% Treatment Notes Electronic Signature(s) Signed: 11/24/2015 4:44:15 PM By: Alejandro Mulling Entered By: Alejandro Mulling on 11/24/2015 09:41:21 [name] ([id number]) -------------------------------------------------------------------------------- Multi-Disciplinary Care Plan Details Patient Name: [name] Date of Service: 11/24/2015 8:45 AM Medical Record Number: [id number] Patient Account Number: [id number] Date of Birth/Sex: 09/09/1970 (45 y.o. Male) Treating RN: Phillis Haggis Primary Care Physician: Other Clinician: Referring Physician: Treating Physician/Extender: Altamese [place] in  Treatment: 14 Active Inactive Abuse / Safety / Falls / Self Care Management Nursing Diagnoses: Potential for falls Goals: Patient will remain injury free Date Initiated: 08/17/2015 Goal Status: Active Interventions: Assess fall risk on admission and as needed Notes: Nutrition Nursing Diagnoses: Imbalanced nutrition Goals: Patient/caregiver agrees to and verbalizes understanding of need to use nutritional supplements and/or vitamins as prescribed Date Initiated: 08/17/2015 Goal Status: Active Interventions: Assess patient nutrition upon admission and as needed per policy Notes: Orientation to the Wound Care Program Nursing Diagnoses: Knowledge deficit related to the wound healing center program Goals: Patient/caregiver will verbalize understanding of the Wound Healing Center Program MOREY, ANDONIAN ([id number]) Date Initiated: 08/17/2015 Goal Status: Active Interventions: Provide education on orientation to the wound center Notes: Pressure Nursing Diagnoses: Knowledge deficit related to causes and risk factors for pressure ulcer development Goals: Patient will remain free from development of additional pressure ulcers Date Initiated: 08/17/2015 Goal Status: Active Interventions: Assess: immobility, friction, shearing, incontinence upon admission and as needed Notes: Wound/Skin Impairment Nursing Diagnoses: Impaired tissue integrity Goals: Ulcer/skin breakdown will have a volume reduction of 30% by week 4 Date Initiated: 08/17/2015 Goal Status: Active Ulcer/skin breakdown will have a volume reduction of 50% by week 8 Date Initiated: 08/17/2015 Goal Status: Active Ulcer/skin breakdown will have a volume reduction of 80% by week 12 Date Initiated: 08/17/2015 Goal Status: Active Interventions: Assess patient/caregiver ability to obtain necessary supplies Assess ulceration(s) every visit Notes: Electronic Signature(s) Signed: 11/24/2015 4:44:15 PM By: Servando Snare, Leonette Most ([id number]) Entered By: Alejandro Mulling on 11/24/2015 09:41:12 [name] ([id number]) -------------------------------------------------------------------------------- Pain Assessment Details Patient Name: [name] Date of Service: 11/24/2015 8:45 AM Medical Record Number: [id number] Patient Account Number: [id number] Date of Birth/Sex: [birth year] (45 y.o. Male) Treating RN: Phillis Haggis Primary Care Physician: Other Clinician: Referring Physician: Treating Physician/Extender: Altamese [place] in Treatment: 14 Active Problems Location of Pain Severity and Description of Pain Patient Has Paino No Site Locations With Dressing Change: No Pain Management and Medication Current Pain Management: Electronic Signature(s) Signed: 11/24/2015 4:44:15 PM By: Alejandro Mulling Entered By: Alejandro Mulling on 11/24/2015 09:32:40 [name] ([id number]) -------------------------------------------------------------------------------- Patient/Caregiver Education Details Patient Name: [name] Date of Service: 11/24/2015 8:45 AM Medical Record Number: [id number] Patient Account Number: [id number] Date of Birth/Gender: [birth year] (45 y.o. Male) Treating RN: Phillis Haggis Primary Care Physician: Other Clinician: Referring Physician: Treating Physician/Extender: Altamese [place] in Treatment: 14 Education Assessment Education Provided To: Patient Education Topics Provided Wound/Skin Impairment: Handouts: Other: change dressing as ordered Methods: Demonstration, Explain/Verbal Responses: State content correctly Electronic Signature(s) Signed: 11/24/2015 4:44:15 PM By: Alejandro Mulling Entered By: Alejandro Mulling on 11/24/2015 09:39:40 [name] ([id number]) -------------------------------------------------------------------------------- Wound Assessment Details Patient Name: [name] Date of Service: 11/24/2015 8:45  AM Medical Record Number: [id number] Patient Account Number: [id number] Date of Birth/Sex: February 10, 1971 (45  y.o. Male) Treating RN: Ashok Cordia, Debi Primary Care Physician: Other Clinician: Referring Physician: Treating Physician/Extender: Maxwell Caul Weeks in Treatment: 14 Wound Status Wound Number: 1 Primary Etiology: Pressure Ulcer Wound Location: Left Coccyx Wound Status: Open Wounding Event: Pressure Injury Comorbid History: Paraplegia Date Acquired: 12/24/2014 Weeks Of Treatment: 14 Clustered Wound: No Photos Photo Uploaded By: Alejandro Mulling on 11/24/2015 12:00:35 Wound Measurements Length: (cm) 0.4 % Reduction i Width: (cm) 1.6 % Reduction i Depth: (cm) 0.4 Epithelializa Area: (cm) 0.503 Tunneling: Volume: (cm) 0.201 Undermining: Starting P Maximum Di n Area: 52.5% n Volume: 62.1% tion: None No Yes osition (o'clock): 9 stance: (cm) 0.4 Wound Description Classification: Category/Stage III Foul Odor Af Wound Margin: Thickened Due to Produ Exudate Amount: Large Exudate Type: Purulent Exudate Color: yellow, brown, green ter Cleansing: Yes ct Use: No Wound Bed Granulation Amount: Small (1-33%) Exposed Structure Kurihara, Kemal ([id number]) Granulation Quality: Pink Fascia Exposed: No Necrotic Amount: Large (67-100%) Fat Layer Exposed: Yes Necrotic Quality: Eschar, Adherent Slough Tendon Exposed: No Muscle Exposed: No Joint Exposed: No Bone Exposed: No Periwound Skin Texture Texture Color No Abnormalities Noted: No No Abnormalities Noted: No Moisture Temperature / Pain No Abnormalities Noted: No Temperature: No Abnormality Dry / Scaly: No Maceration: Yes Moist: Yes Wound Preparation Ulcer Cleansing: Rinsed/Irrigated with Saline Topical Anesthetic Applied: Other: lidocaine 4%, Treatment Notes Wound #1 (Left Coccyx) 1. Cleansed with: Clean wound with Normal Saline 2. Anesthetic Topical Lidocaine 4% cream to wound bed prior to  debridement 3. Peri-wound Care: Skin Prep 4. Dressing Applied: Prisma Ag 5. Secondary Dressing Applied Bordered Foam Dressing Dry Gauze Electronic Signature(s) Signed: 11/24/2015 4:44:15 PM By: Alejandro Mulling Entered By: Alejandro Mulling on 11/24/2015 09:38:03 PRANSHU, LYSTER ([id number]) -------------------------------------------------------------------------------- Vitals Details Patient Name: [name] Date of Service: 11/24/2015 8:45 AM Medical Record Number: [id number] Patient Account Number: [id number] Date of Birth/Sex: 08-15-1970 (45 y.o. Male) Treating RN: Phillis Haggis Primary Care Physician: Other Clinician: Referring Physician: Treating Physician/Extender: Altamese [place] in Treatment: 14 Vital Signs Time Taken: 09:32 Temperature (F): 97.6 Height (in): 67 Pulse (bpm): 79 Weight (lbs): 147 Respiratory Rate (breaths/min): 20 Body Mass Index (BMI): 23 Blood Pressure (mmHg): 92/38 Reference Range: 80 - 120 mg / dl Electronic Signature(s) Signed: 11/24/2015 4:44:15 PM By: Alejandro Mulling Entered By: Alejandro Mulling on 11/24/2015 09:32:58

## 2015-11-25 NOTE — ED Notes (Signed)
Bed: RESB Expected date:  Expected time:  Means of arrival:  Comments: IVC, with [employer], 45 yr old,

## 2015-11-25 NOTE — ED Provider Notes (Signed)
[facility]-EMERGENCY DEPT Provider Note   CSN: [id number] Arrival date & time: 11/25/15  2329  By signing my name below, I, Alyssa Grove, attest that this documentation has been prepared under the direction and in the presence of Elpidio Anis, PA-C. Electronically Signed: Alyssa Grove, ED Scribe. 11/25/15. 11:42 PM.  First MD Initiated Contact with Patient 11/25/15 2340     History   Chief Complaint Chief Complaint  Patient presents with  . Medical Clearance    The history is provided by the patient. No language interpreter was used.    HPI Comments: [name] is a 45 y.o. male who presents to the Emergency Department for medical clearance after an outburst with a relative last night. Pt states he would like to commit himself voluntarily. He states nothing is going on with him. Pt states he has been paralyzed for 10 years. He states he is dealing with a pressure sore off to the side of his sacrum. Pt states there is a lot going on. He states his mother begged him to move down from [place] to [place], [place]. Pt states he is not in a bad place. He states he had a couple drinks last night by himself. His mother came home and "gave him shit about drinking." He became angry and "freaked out", but states he did not physically get violent with her. Pt states today his mother wouldn't leave him alone. He admits to making statements such as "if you don't leave me alone I'll kill you" and "I want to kill myself" but reports these were in response to becoming angry. He denies intent to cause harm to himself or his mother.  Pt denies suicidal ideations, nausea, vomiting, fever, or hallucinations. Pt states he would accept some counseling.    Past Medical History:  Diagnosis Date  . Thoracic spinal cord injury [facility])     Patient Active Problem List   Diagnosis Date Noted  . Stage III pressure ulcer of sacral region (HCC) 11/05/2015  . Open wound of lower back and pelvis w/o penentrat into  retroperitoneum 10/12/2015    Past Surgical History:  Procedure Laterality Date  . PROGRAMABLE BACLOFEN PUMP REVISION Left 2014   Removal      Home Medications    Prior to Admission medications   Medication Sig Start Date End Date Taking? Authorizing Provider  Calcium Citrate-Vitamin D (CALCIUM + D PO) Take by mouth daily.    Historical Provider, MD  diazepam (VALIUM) 5 MG tablet Take 1 tablet (5 mg total) by mouth every 8 (eight) hours as needed for muscle spasms. 11/10/15   Ankit Karis Juba, MD    Family History No family history on file.  Social History Social History  Substance Use Topics  . Smoking status: Current Every Day Smoker    Packs/day: 0.50    Types: Cigarettes  . Smokeless tobacco: Not on file  . Alcohol use Not on file     Allergies   Review of patient's allergies indicates not on file.   Review of Systems Review of Systems  Constitutional: Negative for fever.  HENT: Negative.   Respiratory: Negative.   Cardiovascular: Negative.   Gastrointestinal: Negative.  Negative for nausea and vomiting.  Genitourinary: Negative.   Musculoskeletal: Negative.   Neurological: Negative.   Psychiatric/Behavioral: Positive for agitation. Negative for hallucinations and suicidal ideas.    Physical Exam Updated Vital Signs There were no vitals taken for this visit.  Physical Exam  Constitutional: He appears well-developed and well-nourished.  HENT:  Head: Normocephalic.  Eyes: Conjunctivae are normal.  Cardiovascular: Normal rate.   Pulmonary/Chest: Effort normal. No respiratory distress.  Abdominal: He exhibits no distension.  Musculoskeletal: Normal range of motion.  Neurological: He is alert.  Paraplegic with no lower extremity movement  Skin: Skin is warm and dry.  Psychiatric: He has a normal mood and affect. His behavior is normal.  Nursing note and vitals reviewed.  ED Treatments / Results  DIAGNOSTIC STUDIES: Oxygen Saturation is 100% on RA,  normal by my interpretation.    COORDINATION OF CARE: 11:42 PM Discussed treatment plan with pt at bedside which includes Consult to TTS and pt agreed to plan.  Labs (all labs ordered are listed, but only abnormal results are displayed) Labs Reviewed - No data to display  EKG  EKG Interpretation None       Radiology No results found.  Procedures Procedures (including critical care time)  Medications Ordered in ED Medications - No data to display   Initial Impression / Assessment and Plan / ED Course  I have reviewed the triage vital signs and the nursing notes.  Pertinent labs & imaging results that were available during my care of the patient were reviewed by me and considered in my medical decision making (see chart for details).  Clinical Course    Patient here under IVC petition taken out by mother. He became angry with mom because he said she wouldn't leave him alone with persistent nagging because he was drinking. He denies physical violence. He denies SI/HI, hallucinations. Patient seems reasonable, coherent, oriented and calm/cooperative. TTS consult requested to determine whether IVC is enforceable.   The patient is seen and evaluated by TTS and is found not to meet inpatient criteria. The patient continues to endorse that he is not a threat to his mother or to himself. IVC is rescinded and the patient is discharged home.  Final Clinical Impressions(s) / ED Diagnoses   Final diagnoses:  None  1. Agitation   New Prescriptions New Prescriptions   No medications on file   I personally performed the services described in this documentation, which was scribed in my presence. The recorded information has been reviewed and is accurate.      Elpidio Anis, PA-C 11/26/15 0623    Derwood Kaplan, MD 11/27/15 [phone]

## 2015-11-25 NOTE — Progress Notes (Signed)
Matthew, Frank ([id number]) Visit Report for 11/24/2015 Chief Complaint Document Details Patient Name: [name], [name] Date of Service: 11/24/2015 8:45 AM Medical Record Patient Account Number: [id number] Number: Treating RN: Phillis Haggis May 04, 1970 (45 y.o. Other Clinician: Date of Birth/Sex: Male) Treating ROBSON, MICHAEL Primary Care Physician: Physician/Extender: G Referring Physician: Weeks in Treatment: 14 Information Obtained from: Patient Chief Complaint 08/17/15; the patient is recently really relocated from [facility]. He is here to establish a wound care for a stage III ulcer on his lower sacral area. Electronic Signature(s) Signed: 11/24/2015 4:24:05 PM By: Baltazar Najjar MD Entered By: Baltazar Najjar on 11/24/2015 10:01:17 [name] ([id number]) -------------------------------------------------------------------------------- Debridement Details Patient Name: [name] Date of Service: 11/24/2015 8:45 AM Medical Record Patient Account Number: [id number] Number: Treating RN: Phillis Haggis Nov 12, 1970 (45 y.o. Other Clinician: Date of Birth/Sex: Male) Treating ROBSON, MICHAEL Primary Care Physician: Physician/Extender: G Referring Physician: Weeks in Treatment: 14 Debridement Performed for Wound #1 Left Coccyx Assessment: Performed By: Physician Maxwell Caul, MD Debridement: Debridement Pre-procedure Yes Verification/Time Out Taken: Start Time: 09:42 Pain Control: Other : lidocaine 4% cream Level: Skin/Subcutaneous Tissue Total Area Debrided (L x 0.4 (cm) x 1.5 (cm) = 0.6 (cm) W): Tissue and other Viable, Non-Viable, Exudate, Fibrin/Slough, Subcutaneous material debrided: Instrument: Curette Bleeding: Minimum Hemostasis Achieved: Pressure End Time: 09:44 Procedural Pain: 0 Post Procedural Pain: 0 Response to Treatment: Procedure was tolerated well Post Debridement Measurements of Total Wound Length:  (cm) 0.4 Stage: Category/Stage III Width: (cm) 1.5 Depth: (cm) 0.4 Volume: (cm) 0.188 Post Procedure Diagnosis Same as Pre-procedure Electronic Signature(s) Signed: 11/24/2015 4:24:05 PM By: Baltazar Najjar MD Signed: 11/24/2015 4:44:15 PM By: Alejandro Mulling Entered By: Baltazar Najjar on 11/24/2015 10:01:04 [name] ([id number]) -------------------------------------------------------------------------------- HPI Details Patient Name: [name] Date of Service: 11/24/2015 8:45 AM Medical Record Patient Account Number: [id number] Number: Treating RN: Phillis Haggis 08-22-1970 (45 y.o. 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 He was diagnosed with what sounds like a stage III pressure ulcer in September 2016 although this may have been present for some time longer than that perhaps July 2016. He was followed by wound care in [place]. By his description there were using Silvadene cream for a period of time and then 3 months ago change to Aquacel Ag which she is continued and changes daily with help from his mother. He has recently relocated to [facility]. He went to urgent care to follow-up on his wound on 08/04/2015 they did a culture of this wound that showed Pseudomonas and MSSA. He was placed on an antibiotic but doesn't remember which one it is. He has continued using Aquacel Ag and a foam based cover. I don't have any of his records from the wound care center in [place] although the patient states he did have an MRI that did not show osteomyelitis. He is not aware of having a bone culture done and doesn't think that he had exposed bone on this and any  point. The patient does in and out cath's and has no urinary incontinence issues. He is on appropriate bowel regimen which she administers suppositories. He is able to position himself appropriately and is wheelchair to try and avoid pressure over this wound 08/24/15; small horizontal wound over  his lower sacral area. Requires debridement of surface slough and nonviable subcutaneous tissue. There is a small amount of undermining noted no evidence of infection. On the right side of this wound there is some senescent rolled edges which may need debridement I did not do this today. 09/01/15 this is a small wound over the lower sacral area. It has tunneling superiorly. This goes roughly 1 cm. Nonviable subcutaneous tissue at the inferior aspect of the wound base. No real evidence of infection 09/07/15; small horizontal wound over the lower sacral area. Last week it had 1 cm of superior tunneling which appears to of resolved. No evidence of infection 09/21/15; continues to have a small horizontal wound over the lower sacral area. We have been using Prisma and this has contracted. He tells me that his mother is aware medication not able to change the dressing although he states that he is able to do this 09/28/15; a small horizontal wound over his lower sacral area is all that is left of this wound which was probably much larger and deeper at one point. Depth today at 0.7 cm. Base of this had some nonviable subcutaneous tissue that I removed. Appears to be stalling on current dressing which is Prisma 10/19/15; I have not seen this patient in several weeks. He arrives today with a linear/horizontal wound across his lower sacral area. Considerable depth of this wound. Extensive debridement done 11/17/15; patient arrives for two-week visit. He is not doing well wound is bigger with odor and drainage. Has been using Aquacel Ag. I have not seen this in over a month although I think he was seen by the  physician covering me 2 weeks ago on 11/05/15 11/24/15; I brought this man back this week to review the wound after last week's deterioration. Culture grew Enterobacter and some staph aureus [MSSA]. I had ordered him Cipro but we don't seem to of been able to MONTEL, BORAK ([id number]) communicate with the patient therefore we've called in today. He is not been systemically unwell. Still using Aquacel Electronic Signature(s) Signed: 11/24/2015 4:24:05 PM By: Baltazar Najjar MD Entered By: Baltazar Najjar on 11/24/2015 10:02:49 [name], ROBICHEAU ([id number]) -------------------------------------------------------------------------------- Physical Exam Details Patient Name: [name] Date of Service: 11/24/2015 8:45 AM Medical Record Patient Account Number: [id number] Number: Treating RN: Phillis Haggis 04/22/1971 (45 y.o. Other Clinician: Date of Birth/Sex: Male) Treating ROBSON, MICHAEL Primary Care Physician: Physician/Extender: G Referring Physician: Weeks in Treatment: 14 Constitutional Patient is hypotensive.. Pulse regular and within target range for patient.Marland Kitchen Respirations regular, non-labored and within target range.. Temperature is normal and within the target range for the patient.. Notes Wound exam; debrided of some nonviable subcutaneous tissue at the base of the wound. This is a linear wound with some depth although the base of the wound looks fairly healthy. No surrounding soft tissue infection is observed. Electronic Signature(s) Signed: 11/24/2015 4:24:05 PM By: Baltazar Najjar MD Entered By: Baltazar Najjar on 11/24/2015 10:04:38 [name] ([id number]) -------------------------------------------------------------------------------- Physician Orders Details Patient Name: [name] Date of Service: 11/24/2015 8:45 AM Medical Record Patient Account Number: [id number] Number: Treating RN: Phillis Haggis 1971-01-30 (45 y.o. Other  Clinician: Date of Birth/Sex: Male) Treating ROBSON, MICHAEL Primary Care Physician: Physician/Extender: G Referring Physician: Weeks in Treatment: 58 Verbal / Phone Orders: Yes Clinician: Pinkerton, Debi Read Back and Verified: Yes Diagnosis Coding Wound Cleansing Wound #1 Left Coccyx o Clean wound with Normal Saline. Anesthetic Wound #1 Left Coccyx o Topical Lidocaine 4% cream applied to wound bed prior  to debridement Skin Barriers/Peri-Wound Care Wound #1 Left Coccyx o Skin Prep Primary Wound Dressing o Prisma Ag - moisten with saline Secondary Dressing Wound #1 Left Coccyx o Boardered Foam Dressing - Optifoam Dressing Change Frequency Wound #1 Left Coccyx o Change dressing every other day. - unless there is a lot of drainage Follow-up Appointments Wound #1 Left Coccyx o Return Appointment in 2 weeks. Off-Loading Wound #1 Left Coccyx o Turn and reposition every 2 hours Additional Orders / Instructions Wound #1 Left Coccyx Tyde, Lamison Javonnie ([id number]) o Increase protein intake. Electronic Signature(s) Signed: 11/24/2015 4:24:05 PM By: Baltazar Najjar MD Signed: 11/24/2015 4:44:15 PM By: Alejandro Mulling Entered By: Alejandro Mulling on 11/24/2015 09:44:19 ROMELLE, REILEY ([id number]) -------------------------------------------------------------------------------- Problem List Details Patient Name: [name] Date of Service: 11/24/2015 8:45 AM Medical Record Patient Account Number: [id number] Number: Treating RN: Phillis Haggis 1970-11-22 (45 y.o. Other Clinician: Date of Birth/Sex: Male) Treating ROBSON, MICHAEL Primary Care Physician: Physician/Extender: G Referring Physician: Weeks in Treatment: 14 Active Problems ICD-10 Encounter Code Description Active Date Diagnosis L89.153 Pressure ulcer of sacral region, stage 3 08/17/2015 Yes G82.22 Paraplegia, incomplete 08/17/2015 Yes Inactive Problems Resolved Problems Electronic  Signature(s) Signed: 11/24/2015 4:24:05 PM By: Baltazar Najjar MD Entered By: Baltazar Najjar on 11/24/2015 10:00:14 [name] ([id number]) -------------------------------------------------------------------------------- Progress Note Details Patient Name: [name] Date of Service: 11/24/2015 8:45 AM Medical Record Patient Account Number: [id number] Number: Treating RN: Phillis Haggis [birth year] (45 y.o. Other Clinician: Date of Birth/Sex: Male) Treating ROBSON, MICHAEL Primary Care Physician: Physician/Extender: G Referring Physician: Weeks in Treatment: 14 Subjective Chief Complaint Information obtained from Patient 08/17/15; the patient is recently really relocated from [facility]. He is here to establish a wound care for a stage III ulcer on his lower sacral area. 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 He was diagnosed with what sounds like a stage III pressure ulcer in September 2016 although this may have been present for some time longer than that perhaps July 2016. He was followed by wound care in [place]. By his description there were using Silvadene cream for a period of time and then 3 months ago change to Aquacel Ag which she is continued and changes daily with help from his mother. He has recently relocated to [facility]. He went to urgent care to follow- up on his wound on 08/04/2015 they did a culture of this wound that showed Pseudomonas and MSSA. He was placed on an antibiotic but doesn't remember which one it is. He has continued using Aquacel Ag and a foam based cover. I don't have any of his records from the wound care center in [place] although the patient states he did have an MRI that did not show osteomyelitis. He is not aware of having a bone culture done and doesn't think that he had  exposed bone on this and any point. The patient does in and out cath's and has no urinary incontinence issues. He is on appropriate bowel regimen which she administers suppositories. He is able to position himself appropriately and is wheelchair to try and avoid pressure over this wound 08/24/15; small horizontal wound over his lower sacral area. Requires debridement of surface slough and nonviable subcutaneous tissue. There is a small amount of undermining noted no evidence of infection. On the right side of this wound there is some senescent rolled edges which may need  debridement I did not do this today. 09/01/15 this is a small wound over the lower sacral area. It has tunneling superiorly. This goes roughly 1 cm. Nonviable subcutaneous tissue at the inferior aspect of the wound base. No real evidence of infection 09/07/15; small horizontal wound over the lower sacral area. Last week it had 1 cm of superior tunneling which appears to of resolved. No evidence of infection 09/21/15; continues to have a small horizontal wound over the lower sacral area. We have been using Prisma and this has contracted. He tells me that his mother is aware medication not able to change the dressing although he states that he is able to do this 09/28/15; a small horizontal wound over his lower sacral area is all that is left of this wound which was probably much larger and deeper at one point. Depth today at 0.7 cm. Base of this had some nonviable subcutaneous tissue that I removed. Appears to be stalling on current dressing which is Prisma 10/19/15; I have not seen this patient in several weeks. He arrives today with a linear/horizontal wound JAIYON, WANDER ([id number]) across his lower sacral area. Considerable depth of this wound. Extensive debridement done 11/17/15; patient arrives for two-week visit. He is not doing well wound is bigger with odor and drainage. Has been using Aquacel Ag. I have not seen this in over  a month although I think he was seen by the physician covering me 2 weeks ago on 11/05/15 11/24/15; I brought this man back this week to review the wound after last week's deterioration. Culture grew Enterobacter and some staph aureus [MSSA]. I had ordered him Cipro but we don't seem to of been able to communicate with the patient therefore we've called in today. He is not been systemically unwell. Still using Aquacel Objective Constitutional Patient is hypotensive.. Pulse regular and within target range for patient.Marland Kitchen Respirations regular, non-labored and within target range.. Temperature is normal and within the target range for the patient.. Vitals Time Taken: 9:32 AM, Height: 67 in, Weight: 147 lbs, BMI: 23, Temperature: 97.6 F, Pulse: 79 bpm, Respiratory Rate: 20 breaths/min, Blood Pressure: 92/38 mmHg. General Notes: Wound exam; debrided of some nonviable subcutaneous tissue at the base of the wound. This is a linear wound with some depth although the base of the wound looks fairly healthy. No surrounding soft tissue infection is observed. Integumentary (Hair, Skin) Wound #1 status is Open. Original cause of wound was Pressure Injury. The wound is located on the Left Coccyx. The wound measures 0.4cm length x 1.6cm width x 0.4cm depth; 0.503cm^2 area and 0.201cm^3 volume. There is fat exposed. There is no tunneling noted, however, there is undermining starting at 9:00 and ending at :00 with a maximum distance of 0.4cm. There is a large amount of purulent drainage noted. The wound margin is thickened. There is small (1-33%) pink granulation within the wound bed. There is a large (67-100%) amount of necrotic tissue within the wound bed including Eschar and Adherent Slough. The periwound skin appearance exhibited: Maceration, Moist. The periwound skin appearance did not exhibit: Dry/Scaly. Periwound temperature was noted as No Abnormality. Assessment Active Problems ICD-10 L89.153 -  Pressure ulcer of sacral region, stage 3 G82.22 - Paraplegia, incomplete Meenach, Crist ([id number]) Procedures Wound #1 Wound #1 is a Pressure Ulcer located on the Left Coccyx . There was a Skin/Subcutaneous Tissue Debridement ([id number]) debridement with total area of 0.6 sq cm performed by Maxwell Caul, MD. with the following instrument(s): Curette to remove  Viable and Non-Viable tissue/material including Exudate, Fibrin/Slough, and Subcutaneous after achieving pain control using Other (lidocaine 4% cream). A time out was conducted prior to the start of the procedure. A Minimum amount of bleeding was controlled with Pressure. The procedure was tolerated well with a pain level of 0 throughout and a pain level of 0 following the procedure. Post Debridement Measurements: 0.4cm length x 1.5cm width x 0.4cm depth; 0.188cm^3 volume. Post debridement Stage noted as Category/Stage III. Post procedure Diagnosis Wound #1: Same as Pre-Procedure Plan Wound Cleansing: Wound #1 Left Coccyx: Clean wound with Normal Saline. Anesthetic: Wound #1 Left Coccyx: Topical Lidocaine 4% cream applied to wound bed prior to debridement Skin Barriers/Peri-Wound Care: Wound #1 Left Coccyx: Skin Prep Primary Wound Dressing: Prisma Ag - moisten with saline Secondary Dressing: Wound #1 Left Coccyx: Boardered Foam Dressing - Optifoam Dressing Change Frequency: Wound #1 Left Coccyx: Change dressing every other day. - unless there is a lot of drainage Follow-up Appointments: Wound #1 Left Coccyx: Return Appointment in 2 weeks. Off-Loading: Wound #1 Left Coccyx: Turn and reposition every 2 hours Additional Orders / Instructions: HARLEM, BULA ([id number]) Wound #1 Left Coccyx: Increase protein intake. #1 I've change back to Prisma will see if that is able to fill in this small horizontal wound. #2 patient states he is more compliant with offloading this spending most of his day in bed #3 Cipro 500  twice a day for 10 days Electronic Signature(s) Signed: 11/24/2015 4:24:05 PM By: Baltazar Najjar MD Entered By: Baltazar Najjar on 11/24/2015 10:05:31 [name] ([id number]) -------------------------------------------------------------------------------- SuperBill Details Patient Name: [name] Date of Service: 11/24/2015 Medical Record Patient Account Number: [id number] Number: Treating RN: Phillis Haggis [birth year] (45 y.o. Other Clinician: Date of Birth/Sex: Male) Treating ROBSON, MICHAEL Primary Care Physician: Physician/Extender: G Referring Physician: Weeks in Treatment: 14 Diagnosis Coding ICD-10 Codes Code Description L89.153 Pressure ulcer of sacral region, stage 3 G82.22 Paraplegia, incomplete Facility Procedures CPT4 Code: [id number] Description: 11042 - DEB SUBQ TISSUE 20 SQ CM/< ICD-10 Description Diagnosis L89.153 Pressure ulcer of sacral region, stage 3 Modifier: Quantity: 1 Physician Procedures CPT4 Code: [id number] Description: 11042 - WC PHYS SUBQ TISS 20 SQ CM ICD-10 Description Diagnosis L89.153 Pressure ulcer of sacral region, stage 3 Modifier: Quantity: 1 Electronic Signature(s) Signed: 11/24/2015 4:24:05 PM By: Baltazar Najjar MD Entered By: Baltazar Najjar on 11/24/2015 10:06:08

## 2015-11-25 NOTE — ED Notes (Signed)
PA at bedside.

## 2015-11-26 DIAGNOSIS — R451 Restlessness and agitation: Secondary | ICD-10-CM | POA: Diagnosis not present

## 2015-11-26 LAB — CBC WITH DIFFERENTIAL/PLATELET
BASOS PCT: 1 %
Basophils Absolute: 0.1 10*3/uL (ref 0.0–0.1)
EOS ABS: 0.1 10*3/uL (ref 0.0–0.7)
EOS PCT: 1 %
HCT: 42.9 % (ref 39.0–52.0)
Hemoglobin: 14.7 g/dL (ref 13.0–17.0)
LYMPHS ABS: 2.4 10*3/uL (ref 0.7–4.0)
Lymphocytes Relative: 29 %
MCH: 31.3 pg (ref 26.0–34.0)
MCHC: 34.3 g/dL (ref 30.0–36.0)
MCV: 91.5 fL (ref 78.0–100.0)
Monocytes Absolute: 0.9 10*3/uL (ref 0.1–1.0)
Monocytes Relative: 10 %
Neutro Abs: 5 10*3/uL (ref 1.7–7.7)
Neutrophils Relative %: 59 %
PLATELETS: 381 10*3/uL (ref 150–400)
RBC: 4.69 MIL/uL (ref 4.22–5.81)
RDW: 14 % (ref 11.5–15.5)
WBC: 8.4 10*3/uL (ref 4.0–10.5)

## 2015-11-26 LAB — COMPREHENSIVE METABOLIC PANEL
ALK PHOS: 106 U/L (ref 38–126)
ALT: 15 U/L — AB (ref 17–63)
AST: 30 U/L (ref 15–41)
Albumin: 4.2 g/dL (ref 3.5–5.0)
Anion gap: 9 (ref 5–15)
BILIRUBIN TOTAL: 0.6 mg/dL (ref 0.3–1.2)
BUN: 9 mg/dL (ref 6–20)
CALCIUM: 8.7 mg/dL — AB (ref 8.9–10.3)
CO2: 28 mmol/L (ref 22–32)
CREATININE: 0.7 mg/dL (ref 0.61–1.24)
Chloride: 101 mmol/L (ref 101–111)
Glucose, Bld: 82 mg/dL (ref 65–99)
Potassium: 3.1 mmol/L — ABNORMAL LOW (ref 3.5–5.1)
Sodium: 138 mmol/L (ref 135–145)
TOTAL PROTEIN: 7.6 g/dL (ref 6.5–8.1)

## 2015-11-26 LAB — ETHANOL

## 2015-11-26 LAB — RAPID URINE DRUG SCREEN, HOSP PERFORMED
AMPHETAMINES: NOT DETECTED
Barbiturates: NOT DETECTED
Benzodiazepines: POSITIVE — AB
COCAINE: NOT DETECTED
OPIATES: NOT DETECTED
Tetrahydrocannabinol: POSITIVE — AB

## 2015-11-26 NOTE — BH Assessment (Addendum)
Contacted non-emergency to contact patients mother for duty to warn for patients scheduled discharge due to her not answering the phone. Non emergency to contact security phone once officer goes out.   Davina Poke, LCSW Therapeutic Triage Specialist [facility] 11/26/2015 5:04 AM

## 2015-11-26 NOTE — BH Assessment (Signed)
EDP and EDPA agrees with discharging patient with outpatient resources. Contacted patients mother at [phone] to inform her of patients discharge and if she feels that the patient may be a danger to contact 911. There was no answer and no option to leave a voicemail.   Provided patient with outpatient resources for mental health and substance abuse and information for mobile crisis.   Davina Poke, LCSW Therapeutic Triage Specialist [facility] 11/26/2015 4:26 AM

## 2015-11-26 NOTE — ED Notes (Signed)
TTS at bedside. 

## 2015-11-26 NOTE — BH Assessment (Signed)
Assessment completed. Consulted with Maryjean Morn, PA-C who states that patient does not meet inpatient criteria.  Patient to be discharged with outpatient resources for alcohol and drug use.   Davina Poke, LCSW Therapeutic Triage Specialist [facility] 11/26/2015 2:53 AM

## 2015-11-26 NOTE — BH Assessment (Signed)
Attempted to call patients mother again. There was no answer and no option to leave a voicemail. EDP informed.   Davina Poke, LCSW Therapeutic Triage Specialist [facility] 11/26/2015 4:52 AM

## 2015-11-26 NOTE — ED Notes (Signed)
Pt attempting to call mother and friend for a ride home. Pt unable to contact any one at this time.
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
## 2015-11-26 NOTE — BH Assessment (Signed)
Patient denies SI/HI and reviewed and singed the no-harm contract. Patient received outpatient resources and card for mobile crisis. Patient denies questions or concerns. Patient received copy of no-harm contract and white copy filed. [employer] Officer states that patient cannot be transported by [employer] due to wheelchair. Patient can be transported by [facility] but [facility] will not take his wheelchair. Patient not eligible for cab voucher per Charge nurse.   Davina Poke, LCSW Therapeutic Triage Specialist [facility] 11/26/2015 5:19 AM

## 2015-12-08 ENCOUNTER — Encounter: Payer: Worker's Compensation | Admitting: Surgery

## 2015-12-08 DIAGNOSIS — L89153 Pressure ulcer of sacral region, stage 3: Secondary | ICD-10-CM | POA: Diagnosis not present

## 2015-12-08 DIAGNOSIS — G8222 Paraplegia, incomplete: Secondary | ICD-10-CM | POA: Diagnosis not present

## 2015-12-09 ENCOUNTER — Encounter: Payer: Medicare Other | Attending: Physical Medicine & Rehabilitation | Admitting: Physical Medicine & Rehabilitation

## 2015-12-09 ENCOUNTER — Encounter: Payer: Self-pay | Admitting: Physical Medicine & Rehabilitation

## 2015-12-09 VITALS — BP 113/75 | HR 80 | Resp 16

## 2015-12-09 DIAGNOSIS — R293 Abnormal posture: Secondary | ICD-10-CM | POA: Diagnosis not present

## 2015-12-09 DIAGNOSIS — G8220 Paraplegia, unspecified: Secondary | ICD-10-CM | POA: Diagnosis not present

## 2015-12-09 DIAGNOSIS — G479 Sleep disorder, unspecified: Secondary | ICD-10-CM | POA: Insufficient documentation

## 2015-12-09 DIAGNOSIS — X58XXXA Exposure to other specified factors, initial encounter: Secondary | ICD-10-CM | POA: Diagnosis not present

## 2015-12-09 DIAGNOSIS — M62838 Other muscle spasm: Secondary | ICD-10-CM

## 2015-12-09 DIAGNOSIS — M6249 Contracture of muscle, multiple sites: Secondary | ICD-10-CM | POA: Diagnosis not present

## 2015-12-09 DIAGNOSIS — S31000A Unspecified open wound of lower back and pelvis without penetration into retroperitoneum, initial encounter: Secondary | ICD-10-CM | POA: Insufficient documentation

## 2015-12-09 DIAGNOSIS — R143 Flatulence: Secondary | ICD-10-CM

## 2015-12-09 DIAGNOSIS — K592 Neurogenic bowel, not elsewhere classified: Secondary | ICD-10-CM | POA: Diagnosis not present

## 2015-12-09 DIAGNOSIS — G8222 Paraplegia, incomplete: Secondary | ICD-10-CM | POA: Diagnosis not present

## 2015-12-09 DIAGNOSIS — R252 Cramp and spasm: Secondary | ICD-10-CM | POA: Insufficient documentation

## 2015-12-09 DIAGNOSIS — Z5189 Encounter for other specified aftercare: Secondary | ICD-10-CM | POA: Diagnosis not present

## 2015-12-09 DIAGNOSIS — N319 Neuromuscular dysfunction of bladder, unspecified: Secondary | ICD-10-CM | POA: Diagnosis not present

## 2015-12-09 DIAGNOSIS — IMO0001 Reserved for inherently not codable concepts without codable children: Secondary | ICD-10-CM

## 2015-12-09 MED ORDER — BACLOFEN 10 MG PO TABS: 10.0000 mg | ORAL_TABLET | Freq: Three times a day (TID) | ORAL | 1 refills | Status: DC

## 2015-12-09 MED ORDER — SIMETHICONE 80 MG PO CHEW: 80.0000 mg | CHEWABLE_TABLET | Freq: Two times a day (BID) | ORAL | 1 refills | Status: DC | PRN

## 2015-12-09 NOTE — Progress Notes (Deleted)
   Subjective:    Patient ID: [name], male    DOB: [birth year], 45 y.o.   MRN: [id number]  HPI  Pain Inventory Average Pain 5 Pain Right Now 6 My pain is sharp, burning, stabbing and aching  In the last 24 hours, has pain interfered with the following? General activity 4 Relation with others 5 Enjoyment of life 5 What TIME of day is your pain at its worst? morning, night Sleep (in general) Poor  Pain is worse with: inactivity and unsure Pain improves with: rest and therapy/exercise Relief from Meds: 5  Mobility ability to climb steps?  no do you drive?  yes use a wheelchair transfers alone  Function not employed: date last employed NA I need assistance with the following:  household duties  Neuro/Psych weakness spasms  Prior Studies bone scan x-rays CT/MRI  Physicians involved in your care Any changes since last visit?  no Primary care .   History reviewed. No pertinent family history. Social History   Social History  . Marital status: Divorced    Spouse name: N/A  . Number of children: N/A  . Years of education: N/A   Social History Main Topics  . Smoking status: Current Every Day Smoker    Packs/day: 0.50    Types: Cigarettes  . Smokeless tobacco: Current User  . Alcohol use 0.0 oz/week  . Drug use: Unknown  . Sexual activity: Not Asked   Other Topics Concern  . None   Social History Narrative  . None   Past Surgical History:  Procedure Laterality Date  . PROGRAMABLE BACLOFEN PUMP REVISION Left 2014   Removal    Past Medical History:  Diagnosis Date  . Thoracic spinal cord injury (HCC)    BP 113/75   Pulse 80   Resp 16   SpO2 97%   Opioid Risk Score:   Fall Risk Score:  `1  Depression screen PHQ 2/9  Depression screen PHQ 2/9 10/28/2015  Decreased Interest 1  Down, Depressed, Hopeless 1  PHQ - 2 Score 2  Altered sleeping 2  Tired, decreased energy 1  Change in appetite 0  Feeling bad or failure about yourself  1    Trouble concentrating 1  Moving slowly or fidgety/restless 1  Suicidal thoughts 0  PHQ-9 Score 8  Difficult doing work/chores Somewhat difficult    Review of Systems  Neurological: Positive for weakness.       Spasms   All other systems reviewed and are negative.      Objective:   Physical Exam        Assessment & Plan:

## 2015-12-09 NOTE — Progress Notes (Deleted)
   Subjective:    Patient ID: [name], male    DOB: 10/10/1970, 45 y.o.   MRN: [id number]  HPI  Pain Inventory Average Pain 5 Pain Right Now 6 My pain is sharp, burning, stabbing and aching  In the last 24 hours, has pain interfered with the following? General activity 4 Relation with others 5 Enjoyment of life 5 What TIME of day is your pain at its worst? morning, night Sleep (in general) Poor  Pain is worse with: inactivity and unsure Pain improves with: rest and therapy/exercise Relief from Meds: 5  Mobility ability to climb steps?  no do you drive?  yes use a wheelchair transfers alone  Function not employed: date last employed NA I need assistance with the following:  household duties  Neuro/Psych weakness spasms  Prior Studies bone scan x-rays CT/MRI  Physicians involved in your care Any changes since last visit?  no Primary care .   History reviewed. No pertinent family history. Social History   Social History  . Marital status: Divorced    Spouse name: N/A  . Number of children: N/A  . Years of education: N/A   Social History Main Topics  . Smoking status: Current Every Day Smoker    Packs/day: 0.50    Types: Cigarettes  . Smokeless tobacco: Current User  . Alcohol use 0.0 oz/week  . Drug use: Unknown  . Sexual activity: Not Asked   Other Topics Concern  . None   Social History Narrative  . None   Past Surgical History:  Procedure Laterality Date  . PROGRAMABLE BACLOFEN PUMP REVISION Left 2014   Removal    Past Medical History:  Diagnosis Date  . Thoracic spinal cord injury (HCC)    BP 113/75   Pulse 80   Resp 16   SpO2 97%   Opioid Risk Score:   Fall Risk Score:  `1  Depression screen PHQ 2/9  Depression screen PHQ 2/9 10/28/2015  Decreased Interest 1  Down, Depressed, Hopeless 1  PHQ - 2 Score 2  Altered sleeping 2  Tired, decreased energy 1  Change in appetite 0  Feeling bad or failure about yourself  1    Trouble concentrating 1  Moving slowly or fidgety/restless 1  Suicidal thoughts 0  PHQ-9 Score 8  Difficult doing work/chores Somewhat difficult    Review of Systems  Neurological: Positive for weakness.       Spasms   All other systems reviewed and are negative.      Objective:   Physical Exam        Assessment & Plan:   

## 2015-12-09 NOTE — Progress Notes (Signed)
Subjective:    Patient ID: [name], male    DOB: [birth year], 45 y.o.   MRN: [id number]  HPI  45 y/o male with pmh of T8 SCI 2007 and psh of baclofen pump removal in 04/2013 presents for follow up of for management of his SCI.  His main issues are his postures and asymmetrical weakness and spasms.  Pt had his ITB pump removed due to inconvenience and stressors of withdrawal due to pump failure. He was living [place] and recently relocated to [place].  He is looking to establish care as well.  He has never had AD.  He I/O cath 4-6/day with condom cath at night.  He saw urologist last week and does not report issues.  He does AM bowel program.  No movement or sensation below T8.  He has a new wheelchair. He is independent, lives with his mother.  For spasms, he has tried oral baclofen 20mg  TID, but made his lethargic.  He took valium, which he usually took at night.    45 y/o male with pmh of T8 SCI 2007 and psh of baclofen pump removal in 04/2013 presents for management of SCI.  Last clinic visit 10/28/15.  At that time PT was ordered, but he states he never heard from them.  Baclofen was ordered; he takes 5mg  AM and 10mg  qHS. Valium was also ordered, for which he takes 5mg  2-3/day.  He saw wound care yesterday from a skin tear that opened.  He also saw a behavior health clinic for some support.    Pain Inventory Average Pain 7 Pain Right Now 6 My pain is constant, sharp, burning and aching  In the last 24 hours, has pain interfered with the following? General activity 5 Relation with others 7 Enjoyment of life 5 What TIME of day is your pain at its worst? morning, night Sleep (in general) Poor  Pain is worse with: unsure and some activites Pain improves with: rest, pacing activities and medication Relief from Meds: NA  Mobility do you drive?  yes use a wheelchair transfers alone Do you have any goals in this area?  no  Function Do you have any goals in this area?   no  Neuro/Psych bladder control problems weakness numbness tremor tingling spasms anxiety  Prior Studies Any changes since last visit?  no bone scan x-rays CT/MRI  Physicians involved in your care Primary care Dorothy Scifres    History reviewed. No pertinent family history. Social History   Social History  . Marital status: Divorced    Spouse name: N/A  . Number of children: N/A  . Years of education: N/A   Social History Main Topics  . Smoking status: Current Every Day Smoker    Packs/day: 0.50    Types: Cigarettes  . Smokeless tobacco: Current User  . Alcohol use 0.0 oz/week  . Drug use: Unknown  . Sexual activity: Not Asked   Other Topics Concern  . None   Social History Narrative  . None   Past Surgical History:  Procedure Laterality Date  . PROGRAMABLE BACLOFEN PUMP REVISION Left 2014   Removal    Past Medical History:  Diagnosis Date  . Thoracic spinal cord injury (HCC)    BP 113/75   Pulse 80   Resp 16   SpO2 97%   Opioid Risk Score:   Fall Risk Score:  `1  Depression screen PHQ 2/9  Depression screen PHQ 2/9 10/28/2015  Decreased Interest 1  Down, Depressed, Hopeless  1  PHQ - 2 Score 2  Altered sleeping 2  Tired, decreased energy 1  Change in appetite 0  Feeling bad or failure about yourself  1  Trouble concentrating 1  Moving slowly or fidgety/restless 1  Suicidal thoughts 0  PHQ-9 Score 8  Difficult doing work/chores Somewhat difficult    Review of Systems  Constitutional: Positive for diaphoresis.       Bladder control problems   Gastrointestinal: Positive for abdominal pain.  Skin: Positive for rash.  Neurological: Positive for tremors, weakness and numbness.       Tingling Spasms   Psychiatric/Behavioral: The patient is nervous/anxious.   All other systems reviewed and are negative.     Objective:   Physical Exam Gen: NAD. Vital signs reviewed HENT: Normocephalic, Atraumatic Eyes: EOMI, Conj WNL Cardio: S1, S2  normal, RRR Pulm: B/l clear to auscultation.  Effort normal Abd: Soft, non-distended, non-tender, BS+ MSK:  Gait nonambulatory.   No TTP.    No edema. Neuro: CN II-XII grossly intact.    Sensation absent below T8 dermatomes  Reflexes 1+ B/l LE  Strength  5/5 in all UE myotomes    0/5 in all LE myotomes  Significant spasms in B/l LE, making it difficult to assess spasticity, most pronounces in adductors Skin: Warm and Dry    Assessment & Plan:  45 y/o male with pmh of T8 SCI 2007 and psh of baclofen pump removal in 04/2013 presents for follow up of for management of his SCI.  1. T8 Paraplegia  Encouraged pt to follow up with PT for revaluation, postural exercises (ordered on lost visit)  Cont ROM, stretching  Will refer to Psychology for counseling   UDS reviewed and discussed with pt, +THC  Encouraged pt to [facility] through volunteer office  2. Neurogenic Bladder  Saw Urology  Recommended pt follow up for cystoscopy as it has been >4 years  3. Neurogenic bowel  Cont bowel reg with suppository  4. Spasticity/Spasms   Had ITB pump, but did not want to keep due to convenience and stressors of withdrawls  Cont Baclofen 5 BID, 10 Qhs, will consider tizanidine in future  Cont Valium 5mg  BID  5. Abnormal posture  Encourage pt to follow up with PT for postural stabilization and ?seating evaluation    6. Left lower back open wound  Cont to follow with wound care with dressing changes  7. Sleep disturbance  Valium 5mg   8. Gas  Simethicone ordered

## 2015-12-10 NOTE — Progress Notes (Signed)
[name], [name] ([id number]) Visit Report for 12/08/2015 Arrival Information Details Patient Name: [name], [name] Date of Service: 12/08/2015 10:00 AM Medical Record Number: [id number] Patient Account Number: [id number] Date of Birth/Sex: Apr 05, 1971 [age] y.o. Male) Treating RN: Huel CoventryWoody, Kim Primary Care Physician: PATIENT, NO Other Clinician: Referring Physician: Treating Physician/Extender: Rudene ReBritto, Errol Weeks in Treatment: 16 Visit Information History Since Last Visit Added or deleted any medications: No Patient Arrived: Wheel Chair Any new allergies or adverse reactions: No Arrival Time: 10:20 Had a fall or experienced change in No activities of daily living that may affect Accompanied By: self risk of falls: Transfer Assistance: None Signs or symptoms of abuse/neglect since last No Patient Identification Verified: Yes visito Secondary Verification Process Yes Hospitalized since last visit: No Completed: Has Dressing in Place as Prescribed: Yes Patient Requires Transmission-Based No Pain Present Now: No Precautions: Patient Has Alerts: No Electronic Signature(s) Signed: 12/09/2015 5:06:33 PM By: Elliot GurneyWoody, RN, BSN, Kim RN, BSN Entered By: Elliot GurneyWoody, RN, BSN, Kim on 12/08/2015 10:20:26 [name], [name] ([id number]) -------------------------------------------------------------------------------- Encounter Discharge Information Details Patient Name: [name], [name] Date of Service: 12/08/2015 10:00 AM Medical Record Number: [id number] Patient Account Number: [id number] Date of Birth/Sex: Apr 05, 1971 [age] y.o. Male) Treating RN: Huel CoventryWoody, Kim Primary Care Physician: PATIENT, NO Other Clinician: Referring Physician: Treating Physician/Extender: Rudene ReBritto, Errol Weeks in Treatment: 16 Encounter Discharge Information Items Discharge Pain Level: 0 Discharge Condition: Stable Ambulatory Status: Wheelchair Discharge Destination: Home Transportation: Private Auto Accompanied By: self Schedule  Follow-up Appointment: Yes Medication Reconciliation completed and provided to Patient/Care Yes Anjeanette Petzold: Provided on Clinical Summary of Care: 12/08/2015 Form Type [facility]ip[facility] Signature[facility] Signed: 12/09/2015 5:06:33 PM By: Elliot GurneyWoody, RN, BSN, Kim RN, BSN Previous Signature: 12/08/2015 10:57:15 AM Version By: Gwenlyn PerkingMoore, Shelia Entered By: Elliot GurneyWoody, RN, BSN, Kim on 12/08/2015 12:14:17 [name], Benito ([id number]) -------------------------------------------------------------------------------- Multi Wound Chart Details Patient Name: [name], [name] Date of Service: 12/08/2015 10:00 AM Medical Record Number: [id number] Patient Account Number: [id number] Date of Birth/Sex: Apr 05, 1971 [age] y.o. Male) Treating RN: Huel CoventryWoody, Kim Primary Care Physician: PATIENT, NO Other Clinician: Referring Physician: Treating Physician/Extender: Rudene ReBritto, Errol Weeks in Treatment: 16 Vital Signs Height(in): 67 Pulse(bpm): 75 Weight(lbs): 147 Blood Pressure 120/75 (mmHg): Body Mass Index(BMI): 23 Temperature(F): 97.8 Respiratory Rate 20 (breaths/min): Photos: [1:No Photos] [N/A:N/A] Wound Location: [1:Left Coccyx] [N/A:N/A] Wounding Event: [1:Pressure Injury] [N/A:N/A] Primary Etiology: [1:Pressure Ulcer] [N/A:N/A] Comorbid History: [1:Paraplegia] [N/A:N/A] Date Acquired: [1:12/24/2014] [N/A:N/A] Weeks of Treatment: [1:16] [N/A:N/A] Wound Status: [1:Open] [N/A:N/A] Measurements L x W x D 1x1.5x0.4 [N/A:N/A] (cm) Area (cm) : [1:1.178] [N/A:N/A] Volume (cm) : [1:0.471] [N/A:N/A] % Reduction in Area: [1:-11.10%] [N/A:N/A] % Reduction in Volume: 11.10% [N/A:N/A] Starting Position 1 [1:9] (o'clock): Ending Position 1 [1:3] (o'clock): Maximum Distance 1 0.5 (cm): Undermining: [1:Yes] [N/A:N/A] Classification: [1:Category/Stage III] [N/A:N/A] Exudate Amount: [1:Large] [N/A:N/A] Exudate Type: [1:Purulent] [N/A:N/A] Exudate Color: [1:yellow, brown, green] [N/A:N/A] Foul Odor After  [1:Yes] [N/A:N/A] Cleansing: Odor Anticipated Due to No [N/A:N/A] Product Use: Wound Margin: [1:Thickened] [N/A:N/A] Granulation Amount: Small (1-33%) N/A N/A Granulation Quality: Pink N/A N/A Necrotic Amount: Large (67-100%) N/A N/A Exposed Structures: Fat: Yes N/A N/A Fascia: No Tendon: No Muscle: No Joint: No Bone: No Epithelialization: None N/A N/A Periwound Skin Texture: No Abnormalities Noted N/A N/A Periwound Skin Maceration: Yes N/A N/A Moisture: Moist: Yes Dry/Scaly: No Periwound Skin Color: No Abnormalities Noted N/A N/A Temperature: No Abnormality N/A N/A Tenderness on No N/A N/A Palpation: Wound Preparation: Ulcer Cleansing: N/A N/A Rinsed/Irrigated with Saline Topical Anesthetic Applied: Other: lidocaine 4% Treatment Notes Electronic Signature(s) Signed: 12/09/2015  5:06:33 PM By: Elliot GurneyWoody, RN, BSN, Kim RN, BSN Entered By: Elliot GurneyWoody, RN, BSN, Kim on 12/08/2015 10:29:12 [name], [name] ([id number]) -------------------------------------------------------------------------------- Multi-Disciplinary Care Plan Details Patient Name: [name], [name] Date of Service: 12/08/2015 10:00 AM Medical Record Number: [id number] Patient Account Number: [id number] Date of Birth/Sex: Jun 21, 1970 [age] y.o. Male) Treating RN: Huel CoventryWoody, Kim Primary Care Physician: PATIENT, NO Other Clinician: Referring Physician: Treating Physician/Extender: Rudene ReBritto, Errol Weeks in Treatment: 16 Active Inactive Abuse / Safety / Falls / Self Care Management Nursing Diagnoses: Potential for falls Goals: Patient will remain injury free Date Initiated: 08/17/2015 Goal Status: Active Interventions: Assess fall risk on admission and as needed Notes: Nutrition Nursing Diagnoses: Imbalanced nutrition Goals: Patient/caregiver agrees to and verbalizes understanding of need to use nutritional supplements and/or vitamins as prescribed Date Initiated: 08/17/2015 Goal Status: Active Interventions: Assess  patient nutrition upon admission and as needed per policy Notes: Orientation to the Wound Care Program Nursing Diagnoses: Knowledge deficit related to the wound healing center program Goals: Patient/caregiver will verbalize understanding of the Wound Healing Center Program [name], Jairen ([id number]) Date Initiated: 08/17/2015 Goal Status: Active Interventions: Provide education on orientation to the wound center Notes: Pressure Nursing Diagnoses: Knowledge deficit related to causes and risk factors for pressure ulcer development Goals: Patient will remain free from development of additional pressure ulcers Date Initiated: 08/17/2015 Goal Status: Active Interventions: Assess: immobility, friction, shearing, incontinence upon admission and as needed Notes: Wound/Skin Impairment Nursing Diagnoses: Impaired tissue integrity Goals: Ulcer/skin breakdown will have a volume reduction of 30% by week 4 Date Initiated: 08/17/2015 Goal Status: Active Ulcer/skin breakdown will have a volume reduction of 50% by week 8 Date Initiated: 08/17/2015 Goal Status: Active Ulcer/skin breakdown will have a volume reduction of 80% by week 12 Date Initiated: 08/17/2015 Goal Status: Active Interventions: Assess patient/caregiver ability to obtain necessary supplies Assess ulceration(s) every visit Notes: Electronic Signature(s) Signed: 12/09/2015 5:06:33 PM By: Elliot GurneyWoody, RN, BSN, Kim RN, BSN ParajeHARRIS, NapaHARLES ([id number]) Entered By: Elliot GurneyWoody, RN, BSN, Kim on 12/08/2015 10:29:01 [name], Toren ([id number]) -------------------------------------------------------------------------------- Pain Assessment Details Patient Name: [name], Samule Date of Service: 12/08/2015 10:00 AM Medical Record Number: [id number] Patient Account Number: [id number] Date of Birth/Sex: Jun 21, 1970 [age] y.o. Male) Treating RN: Huel CoventryWoody, Kim Primary Care Physician: PATIENT, NO Other Clinician: Referring Physician: Treating  Physician/Extender: Rudene ReBritto, Errol Weeks in Treatment: 16 Active Problems Location of Pain Severity and Description of Pain Patient Has Paino No Site Locations With Dressing Change: No Pain Management and Medication Current Pain Management: Electronic Signature(s) Signed: 12/09/2015 5:06:33 PM By: Elliot GurneyWoody, RN, BSN, Kim RN, BSN Entered By: Elliot GurneyWoody, RN, BSN, Kim on 12/08/2015 10:20:32 [name], Vega ([id number]) -------------------------------------------------------------------------------- Patient/Caregiver Education Details Patient Name: [name], Ledon Date of Service: 12/08/2015 10:00 AM Medical Record Number: [id number] Patient Account Number: [id number] Date of Birth/Gender: Jun 21, 1970 [age] y.o. Male) Treating RN: Huel CoventryWoody, Kim Primary Care Physician: PATIENT, NO Other Clinician: Referring Physician: Treating Physician/Extender: Rudene ReBritto, Errol Weeks in Treatment: 16 Education Assessment Education Provided To: Patient Education Topics Provided Offloading: Handouts: Other: keep pressure off this area, change dressing more often if it gets wet Methods: Demonstration, Explain/Verbal Responses: State content correctly Electronic Signature(s) Signed: 12/09/2015 5:06:33 PM By: Elliot GurneyWoody, RN, BSN, Kim RN, BSN Entered By: Elliot GurneyWoody, RN, BSN, Kim on 12/08/2015 12:15:04 [name], Noor ([id number]) -------------------------------------------------------------------------------- Wound Assessment Details Patient Name: [name], Kadir Date of Service: 12/08/2015 10:00 AM Medical Record Number: [id number] Patient Account Number: [id number] Date of Birth/Sex: Jun 21, 1970 [age] y.o. Male) Treating RN: Huel CoventryWoody, Kim Primary Care Physician: PATIENT, NO Other Clinician: Referring Physician: Treating Physician/Extender: Meyer RusselBritto,  Errol Weeks in Treatment: 16 Wound Status Wound Number: 1 Primary Etiology: Pressure Ulcer Wound Location: Left Coccyx Wound Status: Open Wounding Event: Pressure Injury Comorbid  History: Paraplegia Date Acquired: 12/24/2014 Weeks Of Treatment: 16 Clustered Wound: No Photos Photo Uploaded By: Elliot Gurney, RN, BSN, Kim on 12/08/2015 15:10:56 Wound Measurements Length: (cm) 1 % Reduction in Width: (cm) 1.5 % Reduction in Depth: (cm) 0.4 Epithelializat Area: (cm) 1.178 Tunneling: Volume: (cm) 0.471 Undermining: Starting Po Ending Posi Maximum Dis Area: -11.1% Volume: 11.1% ion: None No Yes sition (o'clock): 9 tion (o'clock): 3 tance: (cm) 0.5 Wound Description Classification: Category/Stage III Foul Odor Aft Wound Margin: Thickened Due to Produc Exudate Amount: Large Exudate Type: Purulent Exudate Color: yellow, brown, green er Cleansing: Yes t Use: No Wound Bed SHERILL, WEGENER ([id number]) Granulation Amount: Small (1-33%) Exposed Structure Granulation Quality: Pink Fascia Exposed: No Necrotic Amount: Large (67-100%) Fat Layer Exposed: Yes Necrotic Quality: Adherent Slough Tendon Exposed: No Muscle Exposed: No Joint Exposed: No Bone Exposed: No Periwound Skin Texture Texture Color No Abnormalities Noted: No No Abnormalities Noted: No Moisture Temperature / Pain No Abnormalities Noted: No Temperature: No Abnormality Dry / Scaly: No Maceration: Yes Moist: Yes Wound Preparation Ulcer Cleansing: Rinsed/Irrigated with Saline Topical Anesthetic Applied: Other: lidocaine 4%, Treatment Notes Wound #1 (Left Coccyx) 1. Cleansed with: Clean wound with Normal Saline 2. Anesthetic Topical Lidocaine 4% cream to wound bed prior to debridement 4. Dressing Applied: Aquacel Ag 5. Secondary Dressing Applied Bordered Foam Dressing Dry Gauze Electronic Signature(s) Signed: 12/09/2015 5:06:33 PM By: Elliot Gurney, RN, BSN, Kim RN, BSN Entered By: Elliot Gurney, RN, BSN, Kim on 12/08/2015 10:28:41 JUMAANE, WEATHERFORD ([id number]) -------------------------------------------------------------------------------- Vitals Details Patient Name: Matthew Evangelist Date of  Service: 12/08/2015 10:00 AM Medical Record Number: [id number] Patient Account Number: [id number] Date of Birth/Sex: [birth year] (45 y.o. Male) Treating RN: Huel Coventry Primary Care Physician: PATIENT, NO Other Clinician: Referring Physician: Treating Physician/Extender: Rudene Re in Treatment: 16 Vital Signs Time Taken: 10:20 Temperature (F): 97.8 Height (in): 67 Pulse (bpm): 75 Weight (lbs): 147 Respiratory Rate (breaths/min): 20 Body Mass Index (BMI): 23 Blood Pressure (mmHg): 120/75 Reference Range: 80 - 120 mg / dl Electronic Signature(s) Signed: 12/09/2015 5:06:33 PM By: Elliot Gurney, RN, BSN, Kim RN, BSN Entered By: Elliot Gurney, RN, BSN, Kim on 12/08/2015 10:21:07

## 2015-12-10 NOTE — Progress Notes (Signed)
ANGELUS, HOOPES ([id number]) Visit Report for 12/08/2015 Chief Complaint Document Details Patient Name: [name], [name] Date of Service: 12/08/2015 10:00 AM Medical Record Number: [id number] Patient Account Number: [id number] Date of Birth/Sex: [birth year] (45 y.o. Male) Treating RN: Huel Coventry Primary Care Physician: PATIENT, NO Other Clinician: Referring Physician: Treating Physician/Extender: Rudene Re in Treatment: 16 Information Obtained from: Patient Chief Complaint 08/17/15; the patient is recently really relocated from [facility]. He is here to establish a wound care for a stage III ulcer on his lower sacral area. Electronic Signature(s) Signed: 12/08/2015 10:59:50 AM By: Evlyn Kanner MD, FACS Entered By: Evlyn Kanner on 12/08/2015 10:59:49 [name], [name] ([id number]) -------------------------------------------------------------------------------- Debridement Details Patient Name: [name] Date of Service: 12/08/2015 10:00 AM Medical Record Number: [id number] Patient Account Number: [id number] Date of Birth/Sex: 02-20-1971 (45 y.o. Male) Treating RN: Huel Coventry Primary Care Physician: PATIENT, NO Other Clinician: Referring Physician: Treating Physician/Extender: Rudene Re in Treatment: 16 Debridement Performed for Wound #1 Left Coccyx Assessment: Performed By: Physician Evlyn Kanner, MD Debridement: Debridement Pre-procedure Yes Verification/Time Out Taken: Start Time: 10:45 Pain Control: Other : lidocaine 4% Level: Skin/Subcutaneous Tissue Total Area Debrided (L x 1 (cm) x 1.5 (cm) = 1.5 (cm) W): Tissue and other Viable, Non-Viable, Exudate, Fibrin/Slough, Skin, Subcutaneous material debrided: Instrument: Curette Bleeding: Minimum Hemostasis Achieved: Pressure End Time: 10:48 Procedural Pain: Insensate Post Procedural Pain: Insensate Response to Treatment: Procedure was tolerated well Post Debridement Measurements of  Total Wound Length: (cm) 1 Stage: Category/Stage III Width: (cm) 1.5 Depth: (cm) 0.5 Volume: (cm) 0.589 Post Procedure Diagnosis Same as Pre-procedure Electronic Signature(s) Signed: 12/08/2015 10:59:43 AM By: Evlyn Kanner MD, FACS Signed: 12/09/2015 5:06:33 PM By: Elliot Gurney RN, BSN, Kim RN, BSN Entered By: Evlyn Kanner on 12/08/2015 10:59:42 [name], [name] ([id number]) -------------------------------------------------------------------------------- HPI Details Patient Name: [name] Date of Service: 12/08/2015 10:00 AM Medical Record Number: [id number] Patient Account Number: [id number] Date of Birth/Sex: 13-Feb-1971 (45 y.o. 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 He was diagnosed with what sounds like a stage III pressure ulcer in September 2016 although this may have been present for some time longer than that perhaps July 2016. He was followed by wound care in [place]. By his description there were using Silvadene cream for a period of time and then 3 months ago change to Aquacel Ag which she is continued and changes daily with help from his mother. He has recently relocated to [facility]. He went to urgent care to follow-up on his wound on 08/04/2015 they did a culture of this wound that showed Pseudomonas and MSSA. He was placed on an antibiotic but doesn't remember which one it is. He has continued using Aquacel Ag and a foam based cover. I don't have any of his records from the wound care center in [place] although the patient states he did have an MRI that did not show osteomyelitis. He is not aware of having a bone culture done and doesn't think  that he had exposed bone on this and any point. The patient does in and out cath's and has no urinary incontinence issues. He is on appropriate bowel regimen which she administers suppositories. He is able to position himself appropriately and is wheelchair to try and avoid pressure  over this wound 08/24/15; small horizontal wound over his lower sacral area. Requires debridement of surface slough and nonviable subcutaneous tissue. There is a small amount of undermining noted no evidence of infection. On the right side of this wound there is some senescent rolled edges which may need debridement I did not do this today. 09/01/15 this is a small wound over the lower sacral area. It has tunneling superiorly. This goes roughly 1 cm. Nonviable subcutaneous tissue at the inferior aspect of the wound base. No real evidence of infection 09/07/15; small horizontal wound over the lower sacral area. Last week it had 1 cm of superior tunneling which appears to of resolved. No evidence of infection 09/21/15; continues to have a small horizontal wound over the lower sacral area. We have been using Prisma and this has contracted. He tells me that his mother is aware medication not able to change the dressing although he states that he is able to do this 09/28/15; a small horizontal wound over his lower sacral area is all that is left of this wound which was probably much larger and deeper at one point. Depth today at 0.7 cm. Base of this had some nonviable subcutaneous tissue that I removed. Appears to be stalling on current dressing which is Prisma 10/19/15; I have not seen this patient in several weeks. He arrives today with a linear/horizontal wound across his lower sacral area. Considerable depth of this wound. Extensive debridement done 11/17/15; patient arrives for two-week visit. He is not doing well wound is bigger with odor and drainage. Has been using Aquacel Ag. I have not seen this in over a month although  I think he was seen by the physician covering me 2 weeks ago on 11/05/15 11/24/15; I brought this man back this week to review the wound after last week's deterioration. Culture grew Enterobacter and some staph aureus [MSSA]. I had ordered him Cipro but we don't seem to of been able to communicate with the patient therefore we've called in today. He is not been systemically unwell. Still using Aquacel [name], [name] ([id number]) Electronic Signature(s) Signed: 12/08/2015 10:59:56 AM By: Evlyn Kanner MD, FACS Entered By: Evlyn Kanner on 12/08/2015 10:59:55 [name], [name] ([id number]) -------------------------------------------------------------------------------- Physical Exam Details Patient Name: [name] Date of Service: 12/08/2015 10:00 AM Medical Record Number: [id number] Patient Account Number: [id number] Date of Birth/Sex: March 27, 1971 (45 y.o. Male) Treating RN: Huel Coventry Primary Care Physician: PATIENT, NO Other Clinician: Referring Physician: Treating Physician/Extender: Rudene Re in Treatment: 16 Constitutional . Pulse regular. Respirations normal and unlabored. Afebrile. . Eyes Nonicteric. Reactive to light. Ears, Nose, Mouth, and Throat Lips, teeth, and gums WNL.Marland Kitchen Moist mucosa without lesions. Neck supple and nontender. No palpable supraclavicular or cervical adenopathy. Normal sized without goiter. Respiratory WNL. No retractions.. Cardiovascular Pedal Pulses WNL. No clubbing, cyanosis or edema. Chest Breasts symmetical and no nipple discharge.. Breast tissue WNL, no masses, lumps, or tenderness.. Lymphatic No adneopathy. No adenopathy. No adenopathy. Musculoskeletal Adexa without tenderness or enlargement.. Digits and nails w/o clubbing, cyanosis, infection, petechiae, ischemia, or inflammatory conditions.. Integumentary (Hair, Skin) No suspicious lesions. No crepitus or fluctuance. No peri-wound warmth or erythema. No  masses.Marland Kitchen Psychiatric Judgement and insight Intact.. No evidence of depression, anxiety, or agitation.. Notes there is a lot of fibrotic edges with some maceration further up and then there is subcutaneous debride all of it required sharp debridement with a #3 curet and bleeding controlled with pressure Electronic Signature(s) Signed: 12/08/2015 11:00:38 AM By: Evlyn Kanner MD, FACS  Entered By: Evlyn Kanner on 12/08/2015 11:00:37 [name] ([id number]) -------------------------------------------------------------------------------- Physician Orders Details Patient Name: [name] Date of Service: 12/08/2015 10:00 AM Medical Record Number: [id number] Patient Account Number: [id number] Date of Birth/Sex: August 15, 1970 (45 y.o. Male) Treating RN: Huel Coventry Primary Care Physician: PATIENT, NO Other Clinician: Referring Physician: Treating Physician/Extender: Rudene Re in Treatment: 16 Verbal / Phone Orders: Yes Clinician: Huel Coventry Read Back and Verified: Yes Diagnosis Coding Wound Cleansing Wound #1 Left Coccyx o Clean wound with Normal Saline. Anesthetic Wound #1 Left Coccyx o Topical Lidocaine 4% cream applied to wound bed prior to debridement Skin Barriers/Peri-Wound Care Wound #1 Left Coccyx o Skin Prep Primary Wound Dressing o Aquacel Ag - pack into wound Secondary Dressing Wound #1 Left Coccyx o Boardered Foam Dressing - Optifoam Dressing Change Frequency Wound #1 Left Coccyx o Change dressing every day. Follow-up Appointments Wound #1 Left Coccyx o Return Appointment in 1 week. Off-Loading Wound #1 Left Coccyx o Turn and reposition every 2 hours Additional Orders / Instructions Wound #1 Left Coccyx o Increase protein intake. [name], [name] ([id number]) Medications-please add to medication list. Wound #1 Left Coccyx o Other: - Vitamin C, A and Zinc Electronic Signature(s) Signed: 12/08/2015 11:54:41 AM By: Evlyn Kanner  MD, FACS Signed: 12/09/2015 5:06:33 PM By: Elliot Gurney RN, BSN, Kim RN, BSN Entered By: Elliot Gurney, RN, BSN, Kim on 12/08/2015 10:51:44 [name], [name] ([id number]) -------------------------------------------------------------------------------- Problem List Details Patient Name: [name] Date of Service: 12/08/2015 10:00 AM Medical Record Number: [id number] Patient Account Number: [id number] Date of Birth/Sex: 1970-10-16 (45 y.o. Male) Treating RN: Huel Coventry Primary Care Physician: PATIENT, NO Other Clinician: Referring Physician: Treating Physician/Extender: Rudene Re in Treatment: 16 Active Problems ICD-10 Encounter Code Description Active Date Diagnosis L89.153 Pressure ulcer of sacral region, stage 3 08/17/2015 Yes G82.22 Paraplegia, incomplete 08/17/2015 Yes Inactive Problems Resolved Problems Electronic Signature(s) Signed: 12/08/2015 10:59:33 AM By: Evlyn Kanner MD, FACS Entered By: Evlyn Kanner on 12/08/2015 10:59:32 [name] ([id number]) -------------------------------------------------------------------------------- Progress Note Details Patient Name: [name] Date of Service: 12/08/2015 10:00 AM Medical Record Number: [id number] Patient Account Number: [id number] Date of Birth/Sex: 21-Jan-1971 (45 y.o. Male) Treating RN: Huel Coventry Primary Care Physician: PATIENT, NO Other Clinician: Referring Physician: Treating Physician/Extender: Rudene Re in Treatment: 16 Subjective Chief Complaint Information obtained from Patient 08/17/15; the patient is recently really relocated from [facility]. He is here to establish a wound care for a stage III ulcer on his lower sacral area. 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 He was diagnosed with what sounds like a stage III pressure ulcer in September 2016 although this  may have been present for some time longer than that perhaps July 2016. He was followed by wound care in [place]. By his description there were using Silvadene cream for a period of time and then 3 months ago change to Aquacel Ag which she is continued and changes daily with help from his mother. He has recently relocated to [facility]. He went to urgent care to follow- up on his wound on 08/04/2015 they did a culture of this wound that showed Pseudomonas and MSSA. He was placed on an antibiotic but doesn't remember which one it is. He has continued using Aquacel Ag and a foam based cover. I don't have any of his records from the wound care center in [place] although the patient states  he did have an MRI that did not show osteomyelitis. He is not aware of having a bone culture done and doesn't think that he had exposed bone on this and any point. The patient does in and out cath's and has no urinary incontinence issues. He is on appropriate bowel regimen which she administers suppositories. He is able to position himself appropriately and is wheelchair to try and avoid pressure over this wound 08/24/15; small horizontal wound over his lower sacral area. Requires debridement of surface slough and nonviable subcutaneous tissue. There is a small amount of undermining noted no evidence of infection. On the right side of this wound there is some senescent rolled edges which may need debridement I did not do this today. 09/01/15 this is a small wound over the lower sacral area. It has tunneling superiorly. This goes roughly 1 cm. Nonviable subcutaneous tissue at the inferior aspect of the wound base. No real evidence of infection 09/07/15; small horizontal wound over the lower sacral area. Last week it had 1 cm of superior tunneling which appears to of resolved. No evidence of infection 09/21/15; continues to have a small horizontal wound over the lower sacral area. We have been  using Prisma and this has contracted. He tells me that his mother is aware medication not able to change the dressing although he states that he is able to do this 09/28/15; a small horizontal wound over his lower sacral area is all that is left of this wound which was probably much larger and deeper at one point. Depth today at 0.7 cm. Base of this had some nonviable subcutaneous tissue that I removed. Appears to be stalling on current dressing which is Prisma 10/19/15; I have not seen this patient in several weeks. He arrives today with a linear/horizontal wound across his lower sacral area. Considerable depth of this wound. Extensive debridement done 11/17/15; patient arrives for two-week visit. He is not doing well wound is bigger with odor and drainage. Has Matthew EvangelistHARRIS, [name] ([id number]) been using Aquacel Ag. I have not seen this in over a month although I think he was seen by the physician covering me 2 weeks ago on 11/05/15 11/24/15; I brought this man back this week to review the wound after last week's deterioration. Culture grew Enterobacter and some staph aureus [MSSA]. I had ordered him Cipro but we don't seem to of been able to communicate with the patient therefore we've called in today. He is not been systemically unwell. Still using Aquacel Objective Constitutional Pulse regular. Respirations normal and unlabored. Afebrile. Vitals Time Taken: 10:20 AM, Height: 67 in, Weight: 147 lbs, BMI: 23, Temperature: 97.8 F, Pulse: 75 bpm, Respiratory Rate: 20 breaths/min, Blood Pressure: 120/75 mmHg. Eyes Nonicteric. Reactive to light. Ears, Nose, Mouth, and Throat Lips, teeth, and gums WNL.Marland Kitchen. Moist mucosa without lesions. Neck supple and nontender. No palpable supraclavicular or cervical adenopathy. Normal sized without goiter. Respiratory WNL. No retractions.. Cardiovascular Pedal Pulses WNL. No clubbing, cyanosis or edema. Chest Breasts symmetical and no nipple discharge.. Breast  tissue WNL, no masses, lumps, or tenderness.. Lymphatic No adneopathy. No adenopathy. No adenopathy. Musculoskeletal Adexa without tenderness or enlargement.. Digits and nails w/o clubbing, cyanosis, infection, petechiae, ischemia, or inflammatory conditions.Marland Kitchen. Psychiatric Judgement and insight Intact.. No evidence of depression, anxiety, or agitation.Marland Kitchen. Matthew EvangelistHARRIS, [name] ([id number]) General Notes: there is a lot of fibrotic edges with some maceration further up and then there is subcutaneous debride all of it required sharp debridement with a #3 curet and bleeding controlled  with pressure Integumentary (Hair, Skin) No suspicious lesions. No crepitus or fluctuance. No peri-wound warmth or erythema. No masses.. Wound #1 status is Open. Original cause of wound was Pressure Injury. The wound is located on the Left Coccyx. The wound measures 1cm length x 1.5cm width x 0.4cm depth; 1.178cm^2 area and 0.471cm^3 volume. There is fat exposed. There is no tunneling noted, however, there is undermining starting at 9:00 and ending at 3:00 with a maximum distance of 0.5cm. There is a large amount of purulent drainage noted. The wound margin is thickened. There is small (1-33%) pink granulation within the wound bed. There is a large (67-100%) amount of necrotic tissue within the wound bed including Adherent Slough. The periwound skin appearance exhibited: Maceration, Moist. The periwound skin appearance did not exhibit: Dry/Scaly. Periwound temperature was noted as No Abnormality. Assessment Active Problems ICD-10 L89.153 - Pressure ulcer of sacral region, stage 3 G82.22 - Paraplegia, incomplete Procedures Wound #1 Wound #1 is a Pressure Ulcer located on the Left Coccyx . There was a Skin/Subcutaneous Tissue Debridement ([id number]) debridement with total area of 1.5 sq cm performed by Evlyn Kanner, MD. with the following instrument(s): Curette to remove Viable and Non-Viable tissue/material  including Exudate, Fibrin/Slough, Skin, and Subcutaneous after achieving pain control using Other (lidocaine 4%). A time out was conducted prior to the start of the procedure. A Minimum amount of bleeding was controlled with Pressure. The procedure was tolerated well with a pain level of Insensate throughout and a pain level of Insensate following the procedure. Post Debridement Measurements: 1cm length x 1.5cm width x 0.5cm depth; 0.589cm^3 volume. Post debridement Stage noted as Category/Stage III. Post procedure Diagnosis Wound #1: Same as Pre-Procedure [name], [name] ([id number]) Plan Wound Cleansing: Wound #1 Left Coccyx: Clean wound with Normal Saline. Anesthetic: Wound #1 Left Coccyx: Topical Lidocaine 4% cream applied to wound bed prior to debridement Skin Barriers/Peri-Wound Care: Wound #1 Left Coccyx: Skin Prep Primary Wound Dressing: Aquacel Ag - pack into wound Secondary Dressing: Wound #1 Left Coccyx: Boardered Foam Dressing - Optifoam Dressing Change Frequency: Wound #1 Left Coccyx: Change dressing every day. Follow-up Appointments: Wound #1 Left Coccyx: Return Appointment in 1 week. Off-Loading: Wound #1 Left Coccyx: Turn and reposition every 2 hours Additional Orders / Instructions: Wound #1 Left Coccyx: Increase protein intake. Medications-please add to medication list.: Wound #1 Left Coccyx: Other: - Vitamin C, A and Zinc I recommended we continue with daily dressings and change over to silver alginate packing and a bordered foam. Adequate protein intake, vitamin supplements appropriate offloading has been discussed with him. If he can make it weekly visits would benefit him. Electronic Signature(s) Signed: 12/08/2015 11:02:09 AM By: Evlyn Kanner MD, FACS Entered By: Evlyn Kanner on 12/08/2015 11:02:09 [name], [name] ([id number]) PATRICE, MOATES ([id number]) -------------------------------------------------------------------------------- SuperBill  Details Patient Name: [name] Date of Service: 12/08/2015 Medical Record Number: [id number] Patient Account Number: [id number] Date of Birth/Sex: 1970-08-25 (45 y.o. Male) Treating RN: Huel Coventry Primary Care Physician: PATIENT, NO Other Clinician: Referring Physician: Treating Physician/Extender: Rudene Re in Treatment: 16 Diagnosis Coding ICD-10 Codes Code Description L89.153 Pressure ulcer of sacral region, stage 3 G82.22 Paraplegia, incomplete Facility Procedures CPT4 Code: [id number] Description: 11042 - DEB SUBQ TISSUE 20 SQ CM/< ICD-10 Description Diagnosis L89.153 Pressure ulcer of sacral region, stage 3 G82.22 Paraplegia, incomplete Modifier: Quantity: 1 Physician Procedures CPT4 Code: [id number] Description: 11042 - WC PHYS SUBQ TISS 20 SQ CM ICD-10 Description Diagnosis L89.153 Pressure ulcer of sacral region, stage 3 G82.22 Paraplegia, incomplete  Modifier: Quantity: 1 Electronic Signature(s) Signed: 12/08/2015 11:02:20 AM By: Evlyn KannerBritto, Annie Saephan MD, FACS Entered By: Evlyn KannerBritto, Oswaldo Cueto on 12/08/2015 11:02:20

## 2015-12-15 ENCOUNTER — Encounter: Payer: Worker's Compensation | Admitting: Internal Medicine

## 2015-12-15 DIAGNOSIS — L89153 Pressure ulcer of sacral region, stage 3: Secondary | ICD-10-CM | POA: Diagnosis not present

## 2015-12-16 NOTE — Progress Notes (Signed)
[name], [name] ([id number]) Visit Report for 12/15/2015 Chief Complaint Document Details Patient Name: [name], [name] Date of Service: 12/15/2015 2:15 PM Medical Record Patient Account Number: [id number] Number: Treating RN: Phillis Haggisinkerton, Debi [birth year] (45 y.o. Other Clinician: Date of Birth/Sex: Male) Treating Jhene Westmoreland Primary Care Physician: PATIENT, NO Physician/Extender: G Referring Physician: Weeks in Treatment: 17 Information Obtained from: Patient Chief Complaint 08/17/15; the patient is recently really relocated [place] [facility] [place]. He is here to establish a wound care for a stage III ulcer on his lower sacral area. Electronic Signature(s) Signed: 12/15/2015 5:34:52 PM By: Baltazar Najjarobson, Wilford Merryfield MD Entered By: Baltazar Najjarobson, Yvetta Drotar on 12/15/2015 17:18:31 [name], [name] ([id number]) -------------------------------------------------------------------------------- HPI Details Patient Name: [name], [name] Date of Service: 12/15/2015 2:15 PM Medical Record Patient Account Number: [id number] Number: Treating RN: Phillis Haggisinkerton, Debi [birth year] (45 y.o. 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 He was diagnosed with what sounds like a stage III pressure ulcer in September 2016 although this may have been present for some time longer than that perhaps July 2016. [place] By his description there were using Silvadene cream for a period of time and then 3 months ago change to Aquacel Ag which she is continued and changes daily with help from his mother. [facility] [place] [facility] [place] [facility] [place] He went to urgent care  to follow-up on his wound on 08/04/2015 they did a culture of this wound that showed Pseudomonas and MSSA. He was placed on an antibiotic but doesn't remember which one it is. He has continued using Aquacel Ag and a foam based cover. I don't have any of his records from the wound care [place] although the patient states he did have an MRI that did not show osteomyelitis. He is not aware of having a bone culture done and doesn't think that he had exposed bone on this and any point. The patient does in and out cath's and has no urinary incontinence issues. He is on appropriate bowel regimen which she administers suppositories. He is able to position himself appropriately and is wheelchair to try and avoid pressure over this wound 08/24/15; small horizontal wound over his lower sacral area. Requires debridement of surface slough and nonviable subcutaneous tissue. There is a small amount of undermining noted no evidence of infection. On the right side of this wound there is some senescent rolled edges which may need debridement I did not do this today. 09/01/15 this is a small wound over the lower sacral area. It has tunneling superiorly. This goes roughly 1 cm. Nonviable subcutaneous tissue at the inferior aspect of the wound base. No real evidence of infection 09/07/15; small horizontal wound over the lower sacral area. Last week it had 1 cm of superior tunneling which appears to of resolved. No evidence of infection 09/21/15; continues to have a small horizontal wound over the lower sacral area. We have been using Prisma and this has contracted. He tells me that his mother is aware medication not able to change the dressing although he states that he is able to do this 09/28/15; a small horizontal wound over his lower sacral area is all that is left of this wound which was probably much larger and  deeper at one point. Depth today at 0.7 cm. Base of this had some nonviable subcutaneous  tissue that I removed. Appears to be stalling on current dressing which is Prisma 10/19/15; I have not seen this patient in several weeks. He arrives today with a linear/horizontal wound across his lower sacral area. Considerable depth of this wound. Extensive debridement done 11/17/15; patient arrives for two-week visit. He is not doing well wound is bigger with odor and drainage. Has been using Aquacel Ag. I have not seen this in over a month although I think he was seen by the physician covering me 2 weeks ago on 11/05/15 11/24/15; I brought this man back this week to review the wound after last week's deterioration. Culture grew Enterobacter and some staph aureus [MSSA]. I had ordered him Cipro but we don't seem to of been able to communicate with the patient therefore we've called in today. He is not been systemically unwell. Still using Matthew, Frank ([id number]) Aquacel 12/15/15; very little change in this wound since the last time I saw it. Horizontal wound over the lower sacral area probably about 2 cm in width and 0.8 cm in depth. There is no evidence of infection. There appears to be callus around this. In spite of the patient's best efforts I think there is an adequate pressure relief here. No current evidence of infection Electronic Signature(s) Signed: 12/15/2015 5:34:52 PM By: Baltazar Najjar MD Entered By: Baltazar Najjar on 12/15/2015 17:20:03 Matthew, Frank ([id number]) -------------------------------------------------------------------------------- Physical Exam Details Patient Name: [name] Date of Service: 12/15/2015 2:15 PM Medical Record Patient Account Number: [id number] Number: Treating RN: Phillis Haggis March 01, 1971 (45 y.o. Other Clinician: Date of Birth/Sex: Male) Treating Holli Rengel Primary Care Physician: PATIENT, NO Physician/Extender: G Referring Physician: Weeks in Treatment: 17 Notes Wound exam; not a lot of difference in this wound.  There is callus around this and fibrotic edges. The base of it appears clean and there is no drainage. No debridement done today Electronic Signature(s) Signed: 12/15/2015 5:34:52 PM By: Baltazar Najjar MD Entered By: Baltazar Najjar on 12/15/2015 17:21:20 KUNTA, HILLEARY ([id number]) -------------------------------------------------------------------------------- Physician Orders Details Patient Name: [name] Date of Service: 12/15/2015 2:15 PM Medical Record Patient Account Number: [id number] Number: Treating RN: Phillis Haggis 1970/07/26 (45 y.o. Other Clinician: Date of Birth/Sex: Male) Treating Tien Aispuro Primary Care Physician: PATIENT, NO Physician/Extender: G Referring Physician: Weeks in Treatment: 39 Verbal / Phone Orders: Yes Clinician: Pinkerton, Debi Read Back and Verified: Yes Diagnosis Coding Wound Cleansing Wound #1 Left Coccyx o Clean wound with Normal Saline. Anesthetic Wound #1 Left Coccyx o Topical Lidocaine 4% cream applied to wound bed prior to debridement Skin Barriers/Peri-Wound Care Wound #1 Left Coccyx o Skin Prep Primary Wound Dressing o Aquacel Ag - pack into wound Secondary Dressing Wound #1 Left Coccyx o Foam o Tegaderm Dressing Change Frequency Wound #1 Left Coccyx o Change dressing every other day. Follow-up Appointments Wound #1 Left Coccyx o Return Appointment in 1 week. Off-Loading Wound #1 Left Coccyx o Turn and reposition every 2 hours Additional Orders / Instructions TOME, WILSON ([id number]) Wound #1 Left Coccyx o Increase protein intake. Medications-please add to medication list. Wound #1 Left Coccyx o Other: - Vitamin C, A and Zinc Electronic Signature(s) Signed: 12/15/2015 5:34:21 PM By: Alejandro Mulling Signed: 12/15/2015 5:34:52 PM By: Baltazar Najjar MD Entered By: Alejandro Mulling on 12/15/2015 15:12:48 TIMOTHEY, DAHLSTROM  ([id number]) -------------------------------------------------------------------------------- Problem List Details Patient Name: [name] Date of Service: 12/15/2015 2:15 PM Medical Record Patient Account  Number: [id number] Number: Treating RN: Phillis Haggisinkerton, Debi 01/20/1971 (45 y.o. Other Clinician: Date of Birth/Sex: Male) Treating Dionte Blaustein Primary Care Physician: PATIENT, NO Physician/Extender: G Referring Physician: Weeks in Treatment: 17 Active Problems ICD-10 Encounter Code Description Active Date Diagnosis L89.153 Pressure ulcer of sacral region, stage 3 08/17/2015 Yes G82.22 Paraplegia, incomplete 08/17/2015 Yes Inactive Problems Resolved Problems Electronic Signature(s) Signed: 12/15/2015 5:34:52 PM By: Baltazar Najjarobson, Falynn Ailey MD Entered By: Baltazar Najjarobson, Nkechi Linehan on 12/15/2015 17:18:01 [name], [name] ([id number]) -------------------------------------------------------------------------------- Progress Note Details Patient Name: [name], [name] Date of Service: 12/15/2015 2:15 PM Medical Record Patient Account Number: [id number] Number: Treating RN: Phillis Haggisinkerton, Debi 01/20/1971 (45 y.o. Other Clinician: Date of Birth/Sex: Male) Treating Jazmyne Beauchesne Primary Care Physician: PATIENT, NO Physician/Extender: G Referring Physician: Weeks in Treatment: 17 Subjective Chief Complaint Information obtained from Patient 08/17/15; the patient is [place] really [place] [facility] [place]. He is here to establish a wound care for a stage III ulcer on his lower sacral area. 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 He was diagnosed with what sounds like a stage III pressure ulcer in September 2016 although this may have been present for some time longer than that perhaps July 2016. [place] was [place] By his  description there were using Silvadene cream for a period of time and then 3 months ago change to Aquacel Ag which she is continued and changes daily with help from his mother. [facility] [place] He went to urgent care to follow- up on his wound on 08/04/2015 they did a culture of this wound that showed Pseudomonas and MSSA. He was placed on an antibiotic but doesn't remember which one it is. He has continued using Aquacel Ag and a foam based cover. I don't have any of his records from the wound care [place] although the patient states he did have an MRI that did not show osteomyelitis. He is not aware of having a bone culture done and doesn't think that he had exposed bone on this and any point. The patient does in and out cath's and has no urinary incontinence issues. He is on appropriate bowel regimen which she administers suppositories. He is able to position himself appropriately and is wheelchair to try and avoid pressure over this wound 08/24/15; small horizontal wound over his lower sacral area. Requires debridement of surface slough and nonviable subcutaneous tissue. There is a small amount of undermining noted no evidence of infection. On the right side of this wound there is some senescent rolled edges which may need debridement I did not do this today. 09/01/15 this is a small wound over the lower sacral area. It has tunneling superiorly. This goes roughly 1 cm. Nonviable subcutaneous tissue at the inferior aspect of the wound base. No real evidence of infection 09/07/15; small horizontal wound over the lower sacral area. Last week it had 1 cm of superior tunneling which appears to of resolved. No evidence of infection 09/21/15; continues to have a small horizontal wound over the lower sacral area. We have been using Prisma and this has contracted. He tells me that his mother is aware medication not able to change the dressing although he  states that he is able to do this 09/28/15; a small horizontal wound over his lower sacral area is all that is left of this wound  which was probably much larger and deeper at one point. Depth today at 0.7 cm. Base of this had some nonviable subcutaneous tissue that I removed. Appears to be stalling on current dressing which is Prisma 10/19/15; I have not seen this patient in several weeks. He arrives today with a linear/horizontal wound ZYHIR, CAPPELLA ([id number]) across his lower sacral area. Considerable depth of this wound. Extensive debridement done 11/17/15; patient arrives for two-week visit. He is not doing well wound is bigger with odor and drainage. Has been using Aquacel Ag. I have not seen this in over a month although I think he was seen by the physician covering me 2 weeks ago on 11/05/15 11/24/15; I brought this man back this week to review the wound after last week's deterioration. Culture grew Enterobacter and some staph aureus [MSSA]. I had ordered him Cipro but we don't seem to of been able to communicate with the patient therefore we've called in today. He is not been systemically unwell. Still using Aquacel 12/15/15; very little change in this wound since the last time I saw it. Horizontal wound over the lower sacral area probably about 2 cm in width and 0.8 cm in depth. There is no evidence of infection. There appears to be callus around this. In spite of the patient's best efforts I think there is an adequate pressure relief here. No current evidence of infection Objective Constitutional Vitals Time Taken: 2:42 PM, Height: 67 in, Weight: 147 lbs, BMI: 23, Temperature: 98.1 F, Pulse: 76 bpm, Respiratory Rate: 20 breaths/min, Blood Pressure: 100/55 mmHg. Integumentary (Hair, Skin) Wound #1 status is Open. Original cause of wound was Pressure Injury. The wound is located on the Left Coccyx. The wound measures 0.2cm length x 0.8cm width x 0.8cm depth; 0.126cm^2 area and  0.101cm^3 volume. There is fat exposed. There is no tunneling noted, however, there is undermining starting at 9:00 and ending at :00 with a maximum distance of 0.7cm. There is a large amount of serous drainage noted. The wound margin is thickened. There is small (1-33%) pink granulation within the wound bed. There is a large (67-100%) amount of necrotic tissue within the wound bed including Adherent Slough. The periwound skin appearance exhibited: Maceration, Moist. The periwound skin appearance did not exhibit: Dry/Scaly. Periwound temperature was noted as No Abnormality. Assessment Active Problems ICD-10 L89.153 - Pressure ulcer of sacral region, stage 3 G82.22 - Paraplegia, incomplete Tutton, Gwyn ([id number]) Plan Wound Cleansing: Wound #1 Left Coccyx: Clean wound with Normal Saline. Anesthetic: Wound #1 Left Coccyx: Topical Lidocaine 4% cream applied to wound bed prior to debridement Skin Barriers/Peri-Wound Care: Wound #1 Left Coccyx: Skin Prep Primary Wound Dressing: Aquacel Ag - pack into wound Secondary Dressing: Wound #1 Left Coccyx: Foam Tegaderm Dressing Change Frequency: Wound #1 Left Coccyx: Change dressing every other day. Follow-up Appointments: Wound #1 Left Coccyx: Return Appointment in 1 week. Off-Loading: Wound #1 Left Coccyx: Turn and reposition every 2 hours Additional Orders / Instructions: Wound #1 Left Coccyx: Increase protein intake. Medications-please add to medication list.: Wound #1 Left Coccyx: Other: - Vitamin C, A and Zinc #1 stalled wound. I think the major problem here is inadequate pressure-relief although the patient vehemently claims he is doing the best he can. There is no evidence of infection. I changed the primary dressing to [facility]. We need to get some improvement in the granulation then I'll try to take care of the circumference of the wound and the callus surrounding Electronic Signature(s) Signed: 12/15/2015  5:34:52  PM By: Baltazar Najjar MD Entered By: Baltazar Najjar on 12/15/2015 17:22:21 NAS, WAFER ([id number]) HONDO, NANDA ([id number]) -------------------------------------------------------------------------------- SuperBill Details Patient Name: [name] Date of Service: 12/15/2015 Medical Record Patient Account Number: [id number] Number: Treating RN: Phillis Haggis 1970-07-25 (45 y.o. Other Clinician: Date of Birth/Sex: Male) Treating Ingri Diemer Primary Care Physician: PATIENT, NO Physician/Extender: G Referring Physician: Weeks in Treatment: 17 Diagnosis Coding ICD-10 Codes Code Description L89.153 Pressure ulcer of sacral region, stage 3 G82.22 Paraplegia, incomplete Facility Procedures CPT4 Code: [id number] Description: 99213 - WOUND CARE VISIT-LEV 3 EST PT Modifier: Quantity: 1 Physician Procedures CPT4 Code: [id number] Description: 46962 - WC PHYS LEVEL 2 - EST PT ICD-10 Description Diagnosis L89.153 Pressure ulcer of sacral region, stage 3 Modifier: Quantity: 1 Electronic Signature(s) Signed: 12/15/2015 5:34:52 PM By: Baltazar Najjar MD Entered By: Baltazar Najjar on 12/15/2015 17:22:50

## 2015-12-16 NOTE — Progress Notes (Addendum)
[name] ([id number]) Visit Report for 12/15/2015 Arrival Information Details Patient Name: [name], [name] Date of Service: 12/15/2015 2:15 PM Medical Record Number: [id number] Patient Account Number: [id number] Date of Birth/Sex: 1970-12-25 (45 y.o. Male) Treating RN: [name] Primary Care Physician: PATIENT, NO Other Clinician: Referring Physician: Treating Physician/Extender: [name] in Treatment: 17 Visit Information History Since Last Visit All ordered tests and consults were completed: No Patient Arrived: Wheel Chair Added or deleted any medications: No Arrival Time: 14:30 Any new allergies or adverse reactions: No Accompanied By: self Had a fall or experienced change in No activities of daily living that may affect Transfer Assistance: None risk of falls: Patient Identification Verified: Yes Signs or symptoms of abuse/neglect since last No Secondary Verification Process Yes visito Completed: Hospitalized since last visit: No Patient Requires Transmission-Based No Pain Present Now: No Precautions: Patient Has Alerts: No Electronic Signature(s) Signed: 12/15/2015 5:34:21 PM By: [name] Entered By: [name] on 12/15/2015 14:32:31 [name] ([id number]) -------------------------------------------------------------------------------- Clinic Level of Care Assessment Details Patient Name: [name] Date of Service: 12/15/2015 2:15 PM Medical Record Number: [id number] Patient Account Number: [id number] Date of Birth/Sex: [birth year] (45 y.o. Male) Treating RN: [name] Primary Care Physician: PATIENT, NO Other Clinician: Referring Physician: Treating Physician/Extender: [name] in Treatment: 17 Clinic Level of Care Assessment Items TOOL 4 Quantity Score X - Use when only an EandM is performed on FOLLOW-UP visit 1 0 ASSESSMENTS - Nursing Assessment / Reassessment X - Reassessment of  Co-morbidities (includes updates in patient status) 1 10 X - Reassessment of Adherence to Treatment Plan 1 5 ASSESSMENTS - Wound and Skin Assessment / Reassessment X - Simple Wound Assessment / Reassessment - one wound 1 5 []  - Complex Wound Assessment / Reassessment - multiple wounds 0 []  - Dermatologic / Skin Assessment (not related to wound area) 0 ASSESSMENTS - Focused Assessment []  - Circumferential Edema Measurements - multi extremities 0 []  - Nutritional Assessment / Counseling / Intervention 0 []  - Lower Extremity Assessment (monofilament, tuning fork, pulses) 0 []  - Peripheral Arterial Disease Assessment (using hand held doppler) 0 ASSESSMENTS - Ostomy and/or Continence Assessment and Care []  - Incontinence Assessment and Management 0 []  - Ostomy Care Assessment and Management (repouching, etc.) 0 PROCESS - Coordination of Care X - Simple Patient / Family Education for ongoing care 1 15 []  - Complex (extensive) Patient / Family Education for ongoing care 0 []  - Staff obtains Chiropractor, Records, Test Results / Process Orders 0 []  - Staff telephones HHA, Nursing Homes / Clarify orders / etc 0 []  - Routine Transfer to another Facility (non-emergent condition) 0 [name] ([id number]) []  - Routine Hospital Admission (non-emergent condition) 0 []  - New Admissions / Manufacturing engineer / Ordering NPWT, Apligraf, etc. 0 []  - Emergency Hospital Admission (emergent condition) 0 X - Simple Discharge Coordination 1 10 []  - Complex (extensive) Discharge Coordination 0 PROCESS - Special Needs []  - Pediatric / Minor Patient Management 0 []  - Isolation Patient Management 0 []  - Hearing / Language / Visual special needs 0 []  - Assessment of Community assistance (transportation, D/C planning, etc.) 0 []  - Additional assistance / Altered mentation 0 []  - Support Surface(s) Assessment (bed, cushion, seat, etc.) 0 INTERVENTIONS - Wound Cleansing / Measurement X - Simple Wound  Cleansing - one wound 1 5 []  - Complex Wound Cleansing - multiple wounds 0 X - Wound Imaging (photographs - any number of wounds) 1 5 []  - Wound Tracing (instead of photographs)  0 X - Simple Wound Measurement - one wound 1 5 []  - Complex Wound Measurement - multiple wounds 0 INTERVENTIONS - Wound Dressings []  - Small Wound Dressing one or multiple wounds 0 X - Medium Wound Dressing one or multiple wounds 1 15 []  - Large Wound Dressing one or multiple wounds 0 X - Application of Medications - topical 1 5 []  - Application of Medications - injection 0 INTERVENTIONS - Miscellaneous []  - External ear exam 0 [name] ([id number]) []  - Specimen Collection (cultures, biopsies, blood, body fluids, etc.) 0 []  - Specimen(s) / Culture(s) sent or taken to Lab for analysis 0 []  - Patient Transfer (multiple staff / Michiel Sites Lift / Similar devices) 0 []  - Simple Staple / Suture removal (25 or less) 0 []  - Complex Staple / Suture removal (26 or more) 0 []  - Hypo / Hyperglycemic Management (close monitor of Blood Glucose) 0 []  - Ankle / Brachial Index (ABI) - do not check if billed separately 0 X - Vital Signs 1 5 Has the patient been seen at the hospital within the last three years: Yes Total Score: 85 Level Of Care: New/Established - Level 3 Electronic Signature(s) Signed: 12/15/2015 5:34:21 PM By: [name] Entered By: [name] on 12/15/2015 17:17:58 [name] ([id number]) -------------------------------------------------------------------------------- Complex / Palliative Patient Assessment Details Patient Name: [name] Date of Service: 12/15/2015 2:15 PM Medical Record Number: [id number] Patient Account Number: [id number] Date of Birth/Sex: 09-03-1970 (45 y.o. Male) Treating RN: [name] Primary Care Physician: PATIENT, NO Other Clinician: Referring Physician: Treating Physician/Extender: Matthew [place] in Treatment: 17 Palliative Management  Criteria Complex Wound Management Criteria Patient has remarkable or complex co-morbidities requiring medications or treatments that extend wound healing times. Examples: o Diabetes mellitus with chronic renal failure or end stage renal disease requiring dialysis o Advanced or poorly controlled rheumatoid arthritis o Diabetes mellitus and end stage chronic obstructive pulmonary disease o Active cancer with current chemo- or radiation therapy Paraplegia Care Approach Wound Care Plan: Complex Wound Management Electronic Signature(s) Signed: 12/21/2015 2:36:14 PM By: Elliot Gurney, RN, BSN, Kim RN, BSN Signed: 12/22/2015 6:00:53 PM By: Baltazar Najjar MD Entered By: Elliot Gurney, RN, BSN, Kim on 12/21/2015 14:36:14 [name] ([id number]) -------------------------------------------------------------------------------- Encounter Discharge Information Details Patient Name: [name] Date of Service: 12/15/2015 2:15 PM Medical Record Number: [id number] Patient Account Number: [id number] Date of Birth/Sex: [birth year] (45 y.o. Male) Treating RN: [name] Primary Care Physician: PATIENT, NO Other Clinician: Referring Physician: Treating Physician/Extender: Matthew  in Treatment: 17 Encounter Discharge Information Items Discharge Pain Level: 0 Discharge Condition: Stable Ambulatory Status: Wheelchair Discharge Destination: Home Transportation: Private Auto Accompanied By: self Schedule Follow-up Appointment: Yes Medication Reconciliation completed and provided to Patient/Care Yes Angeleigh Chiasson: Provided on Clinical Summary of Care: 12/15/2015 Form Type Recipient Paper Patient [facility] Electronic Signature(s) Signed: 12/15/2015 3:16:38 PM By: Gwenlyn Perking Entered By: Gwenlyn Perking on 12/15/2015 15:16:37 [name] ([id number]) -------------------------------------------------------------------------------- Lower Extremity Assessment Details Patient Name: [name] Date of Service: 12/15/2015 2:15 PM Medical Record Number: [id number] Patient Account Number: [id number] Date of Birth/Sex: 12/06/1970 (45 y.o. Male) Treating RN: [name] Primary Care Physician: PATIENT, NO Other Clinician: Referring Physician: Treating Physician/Extender: Maxwell Caul Weeks in Treatment: 17 Electronic Signature(s) Signed: 12/15/2015 5:34:21 PM By: [name] Entered By: [name] on 12/15/2015 14:32:47 [name] ([id number]) -------------------------------------------------------------------------------- Multi Wound Chart Details Patient Name: [name] Date of Service: 12/15/2015 2:15 PM Medical Record Number: [id number] Patient Account Number: [id number] Date of Birth/Sex: [birth year] (45 y.o.  Male) Treating RN: Ashok CordiaPinkerton, Debi Primary Care Physician: PATIENT, NO Other Clinician: Referring Physician: Treating Physician/Extender: Matthew CarolinaOBSON, MICHAEL G Weeks in Treatment: 17 Photos: [1:No Photos] [N/A:N/A] Wound Location: [1:Left Coccyx] [N/A:N/A] Wounding Event: [1:Pressure Injury] [N/A:N/A] Primary Etiology: [1:Pressure Ulcer] [N/A:N/A] Comorbid History: [1:Paraplegia] [N/A:N/A] Date Acquired: [1:12/24/2014] [N/A:N/A] Weeks of Treatment: [1:17] [N/A:N/A] Wound Status: [1:Open] [N/A:N/A] Measurements L x W x D 0.2x0.8x0.8 [N/A:N/A] (cm) Area (cm) : [1:0.126] [N/A:N/A] Volume (cm) : [1:0.101] [N/A:N/A] % Reduction in Area: [1:88.10%] [N/A:N/A] % Reduction in Volume: 80.90% [N/A:N/A] Starting Position 1 [1:9] (o'clock): Ending Position 1 (o'clock): Maximum Distance 1 0.7 (cm): Undermining: [1:Yes] [N/A:N/A] Classification: [1:Category/Stage III] [N/A:N/A] Exudate Amount: [1:Large] [N/A:N/A] Exudate Type: [1:Serous] [N/A:N/A] Exudate Color: [1:amber] [N/A:N/A] Foul Odor After [1:Yes] [N/A:N/A] Cleansing: Odor Anticipated Due to No [N/A:N/A] Product Use: Wound Margin: [1:Thickened] [N/A:N/A] Granulation Amount:  [1:Small (1-33%)] [N/A:N/A] Granulation Quality: [1:Pink] [N/A:N/A] Necrotic Amount: [1:Large (67-100%)] [N/A:N/A] Exposed Structures: [1:Fat: Yes Fascia: No Tendon: No Muscle: No] [N/A:N/A] Joint: No Bone: No Epithelialization: None N/A N/A Periwound Skin Texture: No Abnormalities Noted N/A N/A Periwound Skin Maceration: Yes N/A N/A Moisture: Moist: Yes Dry/Scaly: No Periwound Skin Color: No Abnormalities Noted N/A N/A Temperature: No Abnormality N/A N/A Tenderness on No N/A N/A Palpation: Wound Preparation: Ulcer Cleansing: N/A N/A Rinsed/Irrigated with Saline Topical Anesthetic Applied: Other: lidocaine 4% Treatment Notes Electronic Signature(s) Signed: 12/15/2015 5:34:21 PM By: Matthew MullingPinkerton, Debra Entered By: Matthew MullingPinkerton, Debra on 12/15/2015 14:42:31 [name], Kelby ([id number]) -------------------------------------------------------------------------------- Multi-Disciplinary Care Plan Details Patient Name: [name], [name] Date of Service: 12/15/2015 2:15 PM Medical Record Number: [id number] Patient Account Number: [id number] Date of Birth/Sex: 08/31/1970 [age] y.o. Male) Treating RN: Matthew HaggisPinkerton, Debi Primary Care Physician: PATIENT, NO Other Clinician: Referring Physician: Treating Physician/Extender: Matthew CarolinaOBSON, MICHAEL G Weeks in Treatment: 17 Active Inactive Abuse / Safety / Falls / Self Care Management Nursing Diagnoses: Potential for falls Goals: Patient will remain injury free Date Initiated: 08/17/2015 Goal Status: Active Interventions: Assess fall risk on admission and as needed Notes: Nutrition Nursing Diagnoses: Imbalanced nutrition Goals: Patient/caregiver agrees to and verbalizes understanding of need to use nutritional supplements and/or vitamins as prescribed Date Initiated: 08/17/2015 Goal Status: Active Interventions: Assess patient nutrition upon admission and as needed per policy Notes: Orientation to the Wound Care Program Nursing  Diagnoses: Knowledge deficit related to the wound healing center program Goals: Patient/caregiver will verbalize understanding of the Wound Healing Center Program [name], [name] ([id number]) Date Initiated: 08/17/2015 Goal Status: Active Interventions: Provide education on orientation to the wound center Notes: Pressure Nursing Diagnoses: Knowledge deficit related to causes and risk factors for pressure ulcer development Goals: Patient will remain free from development of additional pressure ulcers Date Initiated: 08/17/2015 Goal Status: Active Interventions: Assess: immobility, friction, shearing, incontinence upon admission and as needed Notes: Wound/Skin Impairment Nursing Diagnoses: Impaired tissue integrity Goals: Ulcer/skin breakdown will have a volume reduction of 30% by week 4 Date Initiated: 08/17/2015 Goal Status: Active Ulcer/skin breakdown will have a volume reduction of 50% by week 8 Date Initiated: 08/17/2015 Goal Status: Active Ulcer/skin breakdown will have a volume reduction of 80% by week 12 Date Initiated: 08/17/2015 Goal Status: Active Interventions: Assess patient/caregiver ability to obtain necessary supplies Assess ulceration(s) every visit Notes: Electronic Signature(s) Signed: 12/15/2015 5:34:21 PM By: Servando SnarePinkerton, Debra Vicente, Leonette MostHARLES ([id number]) Entered By: Matthew MullingPinkerton, Debra on 12/15/2015 14:42:25 [name], [name] ([id number]) -------------------------------------------------------------------------------- Pain Assessment Details Patient Name: [name], [name] Date of Service: 12/15/2015 2:15 PM Medical Record Number: [id number] Patient Account Number: [id number] Date of Birth/Sex: 08/31/1970 [age] y.o. Male) Treating RN: Ashok CordiaPinkerton,  Debi Primary Care Physician: PATIENT, NO Other Clinician: Referring Physician: Treating Physician/Extender: Matthew Sabin in Treatment: 17 Active Problems Location of Pain Severity and Description of  Pain Patient Has Paino No Site Locations With Dressing Change: No Pain Management and Medication Current Pain Management: Electronic Signature(s) Signed: 12/15/2015 5:34:21 PM By: [name] Entered By: [name] on 12/15/2015 14:32:37 [name] ([id number]) -------------------------------------------------------------------------------- Patient/Caregiver Education Details Patient Name: [name] Date of Service: 12/15/2015 2:15 PM Medical Record Number: [id number] Patient Account Number: [id number] Date of Birth/Gender: 1970/08/24 (45 y.o. Male) Treating RN: [name] Primary Care Physician: PATIENT, NO Other Clinician: Referring Physician: Treating Physician/Extender: Matthew [place] in Treatment: 17 Education Assessment Education Provided To: Patient Education Topics Provided Wound/Skin Impairment: Handouts: Other: change dressing as ordered Methods: Demonstration, Explain/Verbal Responses: State content correctly Electronic Signature(s) Signed: 12/15/2015 5:34:21 PM By: [name] Entered By: [name] on 12/15/2015 14:47:11 Frandsen, Leonette Most ([id number]) -------------------------------------------------------------------------------- Wound Assessment Details Patient Name: [name] Date of Service: 12/15/2015 2:15 PM Medical Record Number: [id number] Patient Account Number: [id number] Date of Birth/Sex: Dec 14, 1970 (45 y.o. Male) Treating RN: [name] Primary Care Physician: PATIENT, NO Other Clinician: Referring Physician: Treating Physician/Extender: Maxwell Caul Weeks in Treatment: 17 Wound Status Wound Number: 1 Primary Etiology: Pressure Ulcer Wound Location: Left Coccyx Wound Status: Open Wounding Event: Pressure Injury Comorbid History: Paraplegia Date Acquired: 12/24/2014 Weeks Of Treatment: 17 Clustered Wound: No Photos Photo Uploaded By: [name] on 12/15/2015 14:45:40 Wound  Measurements Length: (cm) 0.2 % Reduction i Width: (cm) 0.8 % Reduction i Depth: (cm) 0.8 Epithelializa Area: (cm) 0.126 Tunneling: Volume: (cm) 0.101 Undermining: Starting P Maximum Di n Area: 88.1% n Volume: 80.9% tion: None No Yes osition (o'clock): 9 stance: (cm) 0.7 Wound Description Classification: Category/Stage III Foul Odor Af Wound Margin: Thickened Due to Produ Exudate Amount: Large Exudate Type: Serous Exudate Color: amber ter Cleansing: Yes ct Use: No Wound Bed Granulation Amount: Small (1-33%) Exposed Structure MOHD., [name] ([id number]) Granulation Quality: Pink Fascia Exposed: No Necrotic Amount: Large (67-100%) Fat Layer Exposed: Yes Necrotic Quality: Adherent Slough Tendon Exposed: No Muscle Exposed: No Joint Exposed: No Bone Exposed: No Periwound Skin Texture Texture Color No Abnormalities Noted: No No Abnormalities Noted: No Moisture Temperature / Pain No Abnormalities Noted: No Temperature: No Abnormality Dry / Scaly: No Maceration: Yes Moist: Yes Wound Preparation Ulcer Cleansing: Rinsed/Irrigated with Saline Topical Anesthetic Applied: Other: lidocaine 4%, Electronic Signature(s) Signed: 12/15/2015 5:34:21 PM By: [name] Entered By: [name] on 12/15/2015 14:42:14 [name] ([id number]) -------------------------------------------------------------------------------- Vitals Details Patient Name: [name] Date of Service: 12/15/2015 2:15 PM Medical Record Number: [id number] Patient Account Number: [id number] Date of Birth/Sex: [birth year] (45 y.o. Male) Treating RN: [name] Primary Care Physician: PATIENT, NO Other Clinician: Referring Physician: Treating Physician/Extender: Matthew [place] in Treatment: 17 Vital Signs Time Taken: 14:42 Temperature (F): 98.1 Height (in): 67 Pulse (bpm): 76 Weight (lbs): 147 Respiratory Rate (breaths/min): 20 Body Mass Index (BMI): 23 Blood  Pressure (mmHg): 100/55 Reference Range: 80 - 120 mg / dl Electronic Signature(s) Signed: 12/15/2015 5:34:21 PM By: [name] Entered By: [name] on 12/15/2015 14:43:06

## 2015-12-22 ENCOUNTER — Encounter: Payer: Worker's Compensation | Admitting: Internal Medicine

## 2015-12-22 DIAGNOSIS — L89153 Pressure ulcer of sacral region, stage 3: Secondary | ICD-10-CM | POA: Diagnosis not present

## 2015-12-24 NOTE — Progress Notes (Signed)
RAEVON, BROOM ([id number]) Visit Report for 12/22/2015 Chief Complaint Document Details Patient Name: [name], [name] Date of Service: 12/22/2015 2:15 PM Medical Record Patient Account Number: [id number] Number: Treating RN: Phillis Haggis [birth year] (45 y.o. Other Clinician: Date of Birth/Sex: Male) Treating ROBSON, MICHAEL Primary Care Physician: PATIENT, NO Physician/Extender: G Referring Physician: Weeks in Treatment: 18 Information Obtained from: Patient Chief Complaint 08/17/15; the patient is recently really relocated from [facility]. He is here to establish a wound care for a stage III ulcer on his lower sacral area. Electronic Signature(s) Signed: 12/22/2015 4:33:17 PM By: Baltazar Najjar MD Entered By: Baltazar Najjar on 12/22/2015 15:14:25 [name] ([id number]) -------------------------------------------------------------------------------- HPI Details Patient Name: [name] Date of Service: 12/22/2015 2:15 PM Medical Record Patient Account Number: [id number] Number: Treating RN: Phillis Haggis 06-Sep-1970 (45 y.o. 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 He was diagnosed with what sounds like a stage III pressure ulcer in September 2016 although this may have been present for some time longer than that perhaps July 2016. He was followed by wound care in [place]. By his description there were using Silvadene cream for a period of time and then 3 months ago change to Aquacel Ag which she is continued and changes daily with help from his mother. He has recently relocated to [facility]. He went to urgent care  to follow-up on his wound on 08/04/2015 they did a culture of this wound that showed Pseudomonas and MSSA. He was placed on an antibiotic but doesn't remember which one it is. He has continued using Aquacel Ag and a foam based cover. I don't have any of his records from the wound care center in [place] although the patient states he did have an MRI that did not show osteomyelitis. He is not aware of having a bone culture done and doesn't think that he had exposed bone on this and any point. The patient does in and out cath's and has no urinary incontinence issues. He is on appropriate bowel regimen which she administers suppositories. He is able to position himself appropriately and is wheelchair to try and avoid pressure over this wound 08/24/15; small horizontal wound over his lower sacral area. Requires debridement of surface slough and nonviable subcutaneous tissue. There is a small amount of undermining noted no evidence of infection. On the right side of this wound there is some senescent rolled edges which may need debridement I did not do this today. 09/01/15 this is a small wound over the lower sacral area. It has tunneling superiorly. This goes roughly 1 cm. Nonviable subcutaneous tissue at the inferior aspect of the wound base. No real evidence of infection 09/07/15; small horizontal wound over the lower sacral area. Last week it had 1 cm of superior tunneling which appears to of resolved. No evidence of infection 09/21/15; continues to have a small horizontal wound over the lower sacral area. We have been using Prisma and this has contracted. He tells me that his mother is aware medication not able to change the dressing although he states that he is able to do this 09/28/15; a small horizontal wound over his lower sacral area is all that is left of this wound which was probably much larger and  deeper at one point. Depth today at 0.7 cm. Base of this had some nonviable subcutaneous  tissue that I removed. Appears to be stalling on current dressing which is Prisma 10/19/15; I have not seen this patient in several weeks. He arrives today with a linear/horizontal wound across his lower sacral area. Considerable depth of this wound. Extensive debridement done 11/17/15; patient arrives for two-week visit. He is not doing well wound is bigger with odor and drainage. Has been using Aquacel Ag. I have not seen this in over a month although I think he was seen by the physician covering me 2 weeks ago on 11/05/15 11/24/15; I brought this man back this week to review the wound after last week's deterioration. Culture grew Enterobacter and some staph aureus [MSSA]. I had ordered him Cipro but we don't seem to of been able to communicate with the patient therefore we've called in today. He is not been systemically unwell. Still using [name], [name] ([id number]) Aquacel 12/15/15; very little change in this wound since the last time I saw it. Horizontal wound over the lower sacral area probably about 2 cm in width and 0.8 cm in depth. There is no evidence of infection. There appears to be callus around this. In spite of the patient's best efforts I think there is an adequate pressure relief here. No current evidence of infection 12/22/15; horizontal pressure ulcer over the lower sacral area. Appears to be filling in and has better dimensions in appearance today. Still circumferential callus which would suggest inadequate offloading although everything else appears to be improved Electronic Signature(s) Signed: 12/22/2015 4:33:17 PM By: Baltazar Najjar MD Entered By: Baltazar Najjar on 12/22/2015 15:15:35 [name], [name] ([id number]) -------------------------------------------------------------------------------- Physical Exam Details Patient Name: [name] Date of Service: 12/22/2015 2:15 PM Medical Record Patient Account Number: [id number] Number: Treating RN: Phillis Haggis 21-Feb-1971 (46 y.o. Other Clinician: Date of Birth/Sex: Male) Treating ROBSON, MICHAEL Primary Care Physician: PATIENT, NO Physician/Extender: G Referring Physician: Weeks in Treatment: 18 Constitutional Sitting or standing Blood Pressure is within target range for patient.. Pulse regular and within target range for patient.Marland Kitchen Respirations regular, non-labored and within target range.. Temperature is normal and within the target range for the patient.. Patient's appearance is neat and clean. Appears in no acute distress. Well nourished and well developed.. Eyes Conjunctivae clear. No discharge.Marland Kitchen Respiratory Respiratory effort is easy and symmetric bilaterally. Rate is normal at rest and on room air.. Gastrointestinal (GI) Abdomen is soft and non-distended without masses or tenderness. Bowel sounds active in all quadrants.. Psychiatric No evidence of depression, anxiety, or agitation. Calm, cooperative, and communicative. Appropriate interactions and affect.. Notes Wound exam; linear wound appears to be filling in and less with the. There is still callus around the edges although there appears to be less of this. No debridement is felt to be necessary today. No evidence of infection Electronic Signature(s) Signed: 12/22/2015 4:33:17 PM By: Baltazar Najjar MD Entered By: Baltazar Najjar on 12/22/2015 15:17:21 [name], [name] ([id number]) -------------------------------------------------------------------------------- Physician Orders Details Patient Name: [name] Date of Service: 12/22/2015 2:15 PM Medical Record Patient Account Number: [id number] Number: Treating RN: Phillis Haggis 1971-04-24 (45 y.o. Other Clinician: Date of Birth/Sex: Male) Treating ROBSON, MICHAEL Primary Care Physician: PATIENT, NO Physician/Extender: G Referring Physician: Weeks in Treatment: 87 Verbal / Phone Orders: Yes Clinician: Pinkerton, Debi Read Back and Verified:  Yes Diagnosis Coding Wound Cleansing Wound #1 Left Coccyx o Clean wound with Normal Saline. Anesthetic Wound #1 Left Coccyx o Topical Lidocaine 4%  cream applied to wound bed prior to debridement Skin Barriers/Peri-Wound Care Wound #1 Left Coccyx o Skin Prep Primary Wound Dressing o Hydrafera Blue - pack into wound Secondary Dressing Wound #1 Left Coccyx o Foam o Tegaderm Dressing Change Frequency Wound #1 Left Coccyx o Change dressing every other day. Follow-up Appointments Wound #1 Left Coccyx o Return Appointment in 1 week. Off-Loading Wound #1 Left Coccyx o Turn and reposition every 2 hours Additional Orders / Instructions [name], [name] ([id number]) Wound #1 Left Coccyx o Increase protein intake. Medications-please add to medication list. Wound #1 Left Coccyx o Other: - Vitamin C, A and Zinc Electronic Signature(s) Signed: 12/22/2015 4:33:17 PM By: Baltazar Najjar MD Signed: 12/23/2015 4:42:28 PM By: Alejandro Mulling Entered By: Alejandro Mulling on 12/22/2015 15:08:58 [name], [name] ([id number]) -------------------------------------------------------------------------------- Problem List Details Patient Name: [name] Date of Service: 12/22/2015 2:15 PM Medical Record Patient Account Number: [id number] Number: Treating RN: Phillis Haggis 02/12/1971 (45 y.o. Other Clinician: Date of Birth/Sex: Male) Treating ROBSON, MICHAEL Primary Care Physician: PATIENT, NO Physician/Extender: G Referring Physician: Weeks in Treatment: 18 Active Problems ICD-10 Encounter Code Description Active Date Diagnosis L89.153 Pressure ulcer of sacral region, stage 3 08/17/2015 Yes G82.22 Paraplegia, incomplete 08/17/2015 Yes Inactive Problems Resolved Problems Electronic Signature(s) Signed: 12/22/2015 4:33:17 PM By: Baltazar Najjar MD Entered By: Baltazar Najjar on 12/22/2015 15:14:12 [name]  ([id number]) -------------------------------------------------------------------------------- Progress Note Details Patient Name: [name] Date of Service: 12/22/2015 2:15 PM Medical Record Patient Account Number: [id number] Number: Treating RN: Phillis Haggis 02-23-1971 (45 y.o. Other Clinician: Date of Birth/Sex: Male) Treating ROBSON, MICHAEL Primary Care Physician: PATIENT, NO Physician/Extender: G Referring Physician: Weeks in Treatment: 18 Subjective Chief Complaint Information obtained from Patient 08/17/15; the patient is recently really relocated from [facility]. He is here to establish a wound care for a stage III ulcer on his lower sacral area. 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 He was diagnosed with what sounds like a stage III pressure ulcer in September 2016 although this may have been present for some time longer than that perhaps July 2016. He was followed by wound care in [place]. By his description there were using Silvadene cream for a period of time and then 3 months ago change to Aquacel Ag which she is continued and changes daily with help from his mother. He has recently relocated to [facility]. He went to urgent care to follow- up on his wound on 08/04/2015 they did a culture of this wound that showed Pseudomonas and MSSA. He was placed on an antibiotic but doesn't remember which one it is. He has continued using Aquacel Ag and a foam based cover. I don't have any of his records from the wound care center in [place] although the patient states he did have an MRI that did not show osteomyelitis. He is not aware of having a bone culture done and doesn't think that he had exposed bone on this and any point. The patient does in and out cath's and has no urinary incontinence issues. He is on  appropriate bowel regimen which she administers suppositories. He is able to position himself appropriately and is wheelchair to try and avoid pressure over this wound 08/24/15; small horizontal wound over his lower sacral area. Requires debridement of surface slough and nonviable subcutaneous tissue. There is a small amount of undermining noted no evidence of  infection. On the right side of this wound there is some senescent rolled edges which may need debridement I did not do this today. 09/01/15 this is a small wound over the lower sacral area. It has tunneling superiorly. This goes roughly 1 cm. Nonviable subcutaneous tissue at the inferior aspect of the wound base. No real evidence of infection 09/07/15; small horizontal wound over the lower sacral area. Last week it had 1 cm of superior tunneling which appears to of resolved. No evidence of infection 09/21/15; continues to have a small horizontal wound over the lower sacral area. We have been using Prisma and this has contracted. He tells me that his mother is aware medication not able to change the dressing although he states that he is able to do this 09/28/15; a small horizontal wound over his lower sacral area is all that is left of this wound which was probably much larger and deeper at one point. Depth today at 0.7 cm. Base of this had some nonviable subcutaneous tissue that I removed. Appears to be stalling on current dressing which is Prisma 10/19/15; I have not seen this patient in several weeks. He arrives today with a linear/horizontal wound [name], [name] ([id number]) across his lower sacral area. Considerable depth of this wound. Extensive debridement done 11/17/15; patient arrives for two-week visit. He is not doing well wound is bigger with odor and drainage. Has been using Aquacel Ag. I have not seen this in over a month although I think he was seen by the physician covering me 2 weeks ago on 11/05/15 11/24/15; I brought this man  back this week to review the wound after last week's deterioration. Culture grew Enterobacter and some staph aureus [MSSA]. I had ordered him Cipro but we don't seem to of been able to communicate with the patient therefore we've called in today. He is not been systemically unwell. Still using Aquacel 12/15/15; very little change in this wound since the last time I saw it. Horizontal wound over the lower sacral area probably about 2 cm in width and 0.8 cm in depth. There is no evidence of infection. There appears to be callus around this. In spite of the patient's best efforts I think there is an adequate pressure relief here. No current evidence of infection 12/22/15; horizontal pressure ulcer over the lower sacral area. Appears to be filling in and has better dimensions in appearance today. Still circumferential callus which would suggest inadequate offloading although everything else appears to be improved Objective Constitutional Sitting or standing Blood Pressure is within target range for patient.. Pulse regular and within target range for patient.Marland Kitchen. Respirations regular, non-labored and within target range.. Temperature is normal and within the target range for the patient.. Patient's appearance is neat and clean. Appears in no acute distress. Well nourished and well developed.. Vitals Time Taken: 2:43 PM, Height: 67 in, Weight: 147 lbs, BMI: 23, Temperature: 98.1 F, Pulse: 92 bpm, Respiratory Rate: 20 breaths/min, Blood Pressure: 115/78 mmHg. Eyes Conjunctivae clear. No discharge.Marland Kitchen. Respiratory Respiratory effort is easy and symmetric bilaterally. Rate is normal at rest and on room air.. Gastrointestinal (GI) Abdomen is soft and non-distended without masses or tenderness. Bowel sounds active in all quadrants.. Psychiatric No evidence of depression, anxiety, or agitation. Calm, cooperative, and communicative. Appropriate interactions and affect.. General Notes: Wound exam; linear  wound appears to be filling in and less with the. There is still callus around the edges although there appears to be less of this. No debridement is felt  to be necessary today. No evidence of infection [name], [name] ([id number]) Integumentary (Hair, Skin) Wound #1 status is Open. Original cause of wound was Pressure Injury. The wound is located on the Left Coccyx. The wound measures 0.2cm length x 0.8cm width x 0.4cm depth; 0.126cm^2 area and 0.05cm^3 volume. There is fat exposed. There is no tunneling noted, however, there is undermining starting at 9:00 and ending at :00 with a maximum distance of 0.4cm. There is a large amount of serous drainage noted. The wound margin is thickened. There is medium (34-66%) pink granulation within the wound bed. There is a medium (34-66%) amount of necrotic tissue within the wound bed including Adherent Slough. The periwound skin appearance exhibited: Maceration, Moist. The periwound skin appearance did not exhibit: Dry/Scaly. Periwound temperature was noted as No Abnormality. Assessment Active Problems ICD-10 L89.153 - Pressure ulcer of sacral region, stage 3 G82.22 - Paraplegia, incomplete Plan Wound Cleansing: Wound #1 Left Coccyx: Clean wound with Normal Saline. Anesthetic: Wound #1 Left Coccyx: Topical Lidocaine 4% cream applied to wound bed prior to debridement Skin Barriers/Peri-Wound Care: Wound #1 Left Coccyx: Skin Prep Primary Wound Dressing: Hydrafera Blue - pack into wound Secondary Dressing: Wound #1 Left Coccyx: Foam Tegaderm Dressing Change Frequency: Wound #1 Left Coccyx: Change dressing every other day. Follow-up Appointments: Wound #1 Left Coccyx: Return Appointment in 1 week. [name], [name] ([id number]) Off-Loading: Wound #1 Left Coccyx: Turn and reposition every 2 hours Additional Orders / Instructions: Wound #1 Left Coccyx: Increase protein intake. Medications-please add to medication list.: Wound #1 Left  Coccyx: Other: - Vitamin C, A and Zinc #1 we continue with Hydrofera Blue with foam-based coverings Electronic Signature(s) Signed: 12/22/2015 4:33:17 PM By: Baltazar Najjar MD Entered By: Baltazar Najjar on 12/22/2015 15:18:09 [name] ([id number]) -------------------------------------------------------------------------------- SuperBill Details Patient Name: [name] Date of Service: 12/22/2015 Medical Record Patient Account Number: [id number] Number: Treating RN: Phillis Haggis 1970-07-20 (45 y.o. Other Clinician: Date of Birth/Sex: Male) Treating ROBSON, MICHAEL Primary Care Physician: PATIENT, NO Physician/Extender: G Referring Physician: Weeks in Treatment: 18 Diagnosis Coding ICD-10 Codes Code Description L89.153 Pressure ulcer of sacral region, stage 3 G82.22 Paraplegia, incomplete Facility Procedures CPT4 Code: [id number] Description: 99213 - WOUND CARE VISIT-LEV 3 EST PT Modifier: Quantity: 1 Physician Procedures CPT4 Code: [id number] Description: 99213 - WC PHYS LEVEL 3 - EST PT ICD-10 Description Diagnosis L89.153 Pressure ulcer of sacral region, stage 3 Modifier: Quantity: 1 Electronic Signature(s) Signed: 12/22/2015 6:00:53 PM By: Baltazar Najjar MD Signed: 12/23/2015 4:42:28 PM By: Alejandro Mulling Previous Signature: 12/22/2015 4:33:17 PM Version By: Baltazar Najjar MD Entered By: Alejandro Mulling on 12/22/2015 16:40:52

## 2015-12-24 NOTE — Progress Notes (Signed)
[name], [name] ([id number]) Visit Report for 12/22/2015 Arrival Information Details Patient Name: [name], [name] Date of Service: 12/22/2015 2:15 PM Medical Record Number: [id number] Patient Account Number: [id number] Date of Birth/Sex: 08/25/1970 (45 y.o. Male) Treating RN: Phillis Haggis Primary Care Physician: PATIENT, NO Other Clinician: Referring Physician: Treating Physician/Extender: Altamese [place] in Treatment: 18 Visit Information History Since Last Visit All ordered tests and consults were completed: No Patient Arrived: Wheel Chair Added or deleted any medications: No Arrival Time: 14:42 Any new allergies or adverse reactions: No Accompanied By: self Had a fall or experienced change in No Transfer Assistance: EasyPivot activities of daily living that may affect Patient Lift risk of falls: Patient Identification Verified: No Signs or symptoms of abuse/neglect since last No Secondary Verification Process No visito Completed: Hospitalized since last visit: No Patient Requires Transmission- No Pain Present Now: No Based Precautions: Patient Has Alerts: No Electronic Signature(s) Signed: 12/23/2015 4:42:28 PM By: Alejandro Mulling Entered By: Alejandro Mulling on 12/22/2015 14:42:36 [name] ([id number]) -------------------------------------------------------------------------------- Clinic Level of Care Assessment Details Patient Name: [name] Date of Service: 12/22/2015 2:15 PM Medical Record Number: [id number] Patient Account Number: [id number] Date of Birth/Sex: Sep 28, 1970 (45 y.o. Male) Treating RN: Phillis Haggis Primary Care Physician: PATIENT, NO Other Clinician: Referring Physician: Treating Physician/Extender: Altamese  in Treatment: 18 Clinic Level of Care Assessment Items TOOL 4 Quantity Score X - Use when only an EandM is performed on FOLLOW-UP visit 1 0 ASSESSMENTS - Nursing Assessment / Reassessment X -  Reassessment of Co-morbidities (includes updates in patient status) 1 10 X - Reassessment of Adherence to Treatment Plan 1 5 ASSESSMENTS - Wound and Skin Assessment / Reassessment X - Simple Wound Assessment / Reassessment - one wound 1 5 []  - Complex Wound Assessment / Reassessment - multiple wounds 0 []  - Dermatologic / Skin Assessment (not related to wound area) 0 ASSESSMENTS - Focused Assessment []  - Circumferential Edema Measurements - multi extremities 0 []  - Nutritional Assessment / Counseling / Intervention 0 []  - Lower Extremity Assessment (monofilament, tuning fork, pulses) 0 []  - Peripheral Arterial Disease Assessment (using hand held doppler) 0 ASSESSMENTS - Ostomy and/or Continence Assessment and Care []  - Incontinence Assessment and Management 0 []  - Ostomy Care Assessment and Management (repouching, etc.) 0 PROCESS - Coordination of Care X - Simple Patient / Family Education for ongoing care 1 15 []  - Complex (extensive) Patient / Family Education for ongoing care 0 []  - Staff obtains Chiropractor, Records, Test Results / Process Orders 0 []  - Staff telephones HHA, Nursing Homes / Clarify orders / etc 0 []  - Routine Transfer to another Facility (non-emergent condition) 0 AMEIR, FARIA ([id number]) []  - Routine Hospital Admission (non-emergent condition) 0 []  - New Admissions / Manufacturing engineer / Ordering NPWT, Apligraf, etc. 0 []  - Emergency Hospital Admission (emergent condition) 0 X - Simple Discharge Coordination 1 10 []  - Complex (extensive) Discharge Coordination 0 PROCESS - Special Needs []  - Pediatric / Minor Patient Management 0 []  - Isolation Patient Management 0 []  - Hearing / Language / Visual special needs 0 []  - Assessment of Community assistance (transportation, D/C planning, etc.) 0 []  - Additional assistance / Altered mentation 0 []  - Support Surface(s) Assessment (bed, cushion, seat, etc.) 0 INTERVENTIONS - Wound Cleansing / Measurement X -  Simple Wound Cleansing - one wound 1 5 []  - Complex Wound Cleansing - multiple wounds 0 X - Wound Imaging (photographs - any number of wounds) 1 5 []  - Wound Tracing (  instead of photographs) 0 X - Simple Wound Measurement - one wound 1 5 []  - Complex Wound Measurement - multiple wounds 0 INTERVENTIONS - Wound Dressings X - Small Wound Dressing one or multiple wounds 1 10 []  - Medium Wound Dressing one or multiple wounds 0 []  - Large Wound Dressing one or multiple wounds 0 X - Application of Medications - topical 1 5 []  - Application of Medications - injection 0 INTERVENTIONS - Miscellaneous []  - External ear exam 0 [name], [name] ([id number]) []  - Specimen Collection (cultures, biopsies, blood, body fluids, etc.) 0 []  - Specimen(s) / Culture(s) sent or taken to Lab for analysis 0 []  - Patient Transfer (multiple staff / Michiel Sites Lift / Similar devices) 0 []  - Simple Staple / Suture removal (25 or less) 0 []  - Complex Staple / Suture removal (26 or more) 0 []  - Hypo / Hyperglycemic Management (close monitor of Blood Glucose) 0 []  - Ankle / Brachial Index (ABI) - do not check if billed separately 0 X - Vital Signs 1 5 Has the patient been seen at the hospital within the last three years: Yes Total Score: 80 Level Of Care: New/Established - Level 3 Electronic Signature(s) Signed: 12/23/2015 4:42:28 PM By: Alejandro Mulling Entered By: Alejandro Mulling on 12/22/2015 16:40:35 [name] ([id number]) -------------------------------------------------------------------------------- Encounter Discharge Information Details Patient Name: [name] Date of Service: 12/22/2015 2:15 PM Medical Record Number: [id number] Patient Account Number: [id number] Date of Birth/Sex: 09/25/1970 (45 y.o. Male) Treating RN: Phillis Haggis Primary Care Physician: PATIENT, NO Other Clinician: Referring Physician: Treating Physician/Extender: Altamese [place] in Treatment: 18 Encounter Discharge  Information Items Discharge Pain Level: 0 Discharge Condition: Stable Ambulatory Status: Wheelchair Discharge Destination: Home Transportation: Private Auto Accompanied By: self Schedule Follow-up Appointment: Yes Medication Reconciliation completed and provided to Patient/Care Yes [name]: Provided on Clinical Summary of Care: 12/22/2015 Form Type Recipient Paper Patient [facility] Electronic Signature(s) Signed: 12/23/2015 4:42:28 PM By: Alejandro Mulling Previous Signature: 12/22/2015 3:09:26 PM Version By: Gwenlyn Perking Entered By: Alejandro Mulling on 12/22/2015 15:09:42 [name] ([id number]) -------------------------------------------------------------------------------- Lower Extremity Assessment Details Patient Name: [name] Date of Service: 12/22/2015 2:15 PM Medical Record Number: [id number] Patient Account Number: [id number] Date of Birth/Sex: July 23, 1970 (44 y.o. Male) Treating RN: Phillis Haggis Primary Care Physician: PATIENT, NO Other Clinician: Referring Physician: Treating Physician/Extender: Maxwell Caul Weeks in Treatment: 18 Electronic Signature(s) Signed: 12/23/2015 4:42:28 PM By: Alejandro Mulling Entered By: Alejandro Mulling on 12/22/2015 14:43:20 [name] ([id number]) -------------------------------------------------------------------------------- Multi Wound Chart Details Patient Name: [name] Date of Service: 12/22/2015 2:15 PM Medical Record Number: [id number] Patient Account Number: [id number] Date of Birth/Sex: [birth year] (45 y.o. Male) Treating RN: Phillis Haggis Primary Care Physician: PATIENT, NO Other Clinician: Referring Physician: Treating Physician/Extender: Maxwell Caul Weeks in Treatment: 18 Vital Signs Height(in): 67 Pulse(bpm): 92 Weight(lbs): 147 Blood Pressure 115/78 (mmHg): Body Mass Index(BMI): 23 Temperature(F): 98.1 Respiratory Rate 20 (breaths/min): Photos: [1:No Photos] [N/A:N/A] Wound  Location: [1:Left Coccyx] [N/A:N/A] Wounding Event: [1:Pressure Injury] [N/A:N/A] Primary Etiology: [1:Pressure Ulcer] [N/A:N/A] Comorbid History: [1:Paraplegia] [N/A:N/A] Date Acquired: [1:12/24/2014] [N/A:N/A] Weeks of Treatment: [1:18] [N/A:N/A] Wound Status: [1:Open] [N/A:N/A] Measurements L x W x D 0.2x0.8x0.4 [N/A:N/A] (cm) Area (cm) : [1:0.126] [N/A:N/A] Volume (cm) : [1:0.05] [N/A:N/A] % Reduction in Area: [1:88.10%] [N/A:N/A] % Reduction in Volume: 90.60% [N/A:N/A] Starting Position 1 [1:9] (o'clock): Ending Position 1 (o'clock): Maximum Distance 1 0.4 (cm): Undermining: [1:Yes] [N/A:N/A] Classification: [1:Category/Stage III] [N/A:N/A] Exudate Amount: [1:Large] [N/A:N/A] Exudate Type: [1:Serous] [N/A:N/A] Exudate Color: [1:amber] [N/A:N/A] Foul Odor  After [1:Yes] [N/A:N/A] Cleansing: Odor Anticipated Due to No [N/A:N/A] Product Use: Wound Margin: [1:Thickened] [N/A:N/A] Granulation Amount: Medium (34-66%) N/A N/A Granulation Quality: Pink N/A N/A Necrotic Amount: Medium (34-66%) N/A N/A Exposed Structures: Fat: Yes N/A N/A Fascia: No Tendon: No Muscle: No Joint: No Bone: No Epithelialization: None N/A N/A Periwound Skin Texture: No Abnormalities Noted N/A N/A Periwound Skin Maceration: Yes N/A N/A Moisture: Moist: Yes Dry/Scaly: No Periwound Skin Color: No Abnormalities Noted N/A N/A Temperature: No Abnormality N/A N/A Tenderness on No N/A N/A Palpation: Wound Preparation: Ulcer Cleansing: N/A N/A Rinsed/Irrigated with Saline Topical Anesthetic Applied: Other: lidocaine 4% Treatment Notes Electronic Signature(s) Signed: 12/23/2015 4:42:28 PM By: Alejandro Mulling Entered By: Alejandro Mulling on 12/22/2015 14:53:25 [name] ([id number]) -------------------------------------------------------------------------------- Multi-Disciplinary Care Plan Details Patient Name: [name] Date of Service: 12/22/2015 2:15 PM Medical Record  Number: [id number] Patient Account Number: [id number] Date of Birth/Sex: 20-Apr-1971 (45 y.o. Male) Treating RN: Phillis Haggis Primary Care Physician: PATIENT, NO Other Clinician: Referring Physician: Treating Physician/Extender: Altamese [place] in Treatment: 18 Active Inactive Abuse / Safety / Falls / Self Care Management Nursing Diagnoses: Potential for falls Goals: Patient will remain injury free Date Initiated: 08/17/2015 Goal Status: Active Interventions: Assess fall risk on admission and as needed Notes: Nutrition Nursing Diagnoses: Imbalanced nutrition Goals: Patient/caregiver agrees to and verbalizes understanding of need to use nutritional supplements and/or vitamins as prescribed Date Initiated: 08/17/2015 Goal Status: Active Interventions: Assess patient nutrition upon admission and as needed per policy Notes: Orientation to the Wound Care Program Nursing Diagnoses: Knowledge deficit related to the wound healing center program Goals: Patient/caregiver will verbalize understanding of the Wound Healing Center Program [name], [name] ([id number]) Date Initiated: 08/17/2015 Goal Status: Active Interventions: Provide education on orientation to the wound center Notes: Pressure Nursing Diagnoses: Knowledge deficit related to causes and risk factors for pressure ulcer development Goals: Patient will remain free from development of additional pressure ulcers Date Initiated: 08/17/2015 Goal Status: Active Interventions: Assess: immobility, friction, shearing, incontinence upon admission and as needed Notes: Wound/Skin Impairment Nursing Diagnoses: Impaired tissue integrity Goals: Ulcer/skin breakdown will have a volume reduction of 30% by week 4 Date Initiated: 08/17/2015 Goal Status: Active Ulcer/skin breakdown will have a volume reduction of 50% by week 8 Date Initiated: 08/17/2015 Goal Status: Active Ulcer/skin breakdown will have a volume reduction  of 80% by week 12 Date Initiated: 08/17/2015 Goal Status: Active Interventions: Assess patient/caregiver ability to obtain necessary supplies Assess ulceration(s) every visit Notes: Electronic Signature(s) Signed: 12/23/2015 4:42:28 PM By: Servando Snare, Matthew Most ([id number]) Entered By: Alejandro Mulling on 12/22/2015 14:53:14 [name] ([id number]) -------------------------------------------------------------------------------- Pain Assessment Details Patient Name: [name] Date of Service: 12/22/2015 2:15 PM Medical Record Number: [id number] Patient Account Number: [id number] Date of Birth/Sex: [birth year] (45 y.o. Male) Treating RN: Phillis Haggis Primary Care Physician: PATIENT, NO Other Clinician: Referring Physician: Treating Physician/Extender: Maxwell Caul Weeks in Treatment: 18 Active Problems Location of Pain Severity and Description of Pain Patient Has Paino No Site Locations With Dressing Change: No Pain Management and Medication Current Pain Management: Electronic Signature(s) Signed: 12/23/2015 4:42:28 PM By: Alejandro Mulling Entered By: Alejandro Mulling on 12/22/2015 14:42:56 [name] ([id number]) -------------------------------------------------------------------------------- Patient/Caregiver Education Details Patient Name: [name] Date of Service: 12/22/2015 2:15 PM Medical Record Number: [id number] Patient Account Number: [id number] Date of Birth/Gender: 03/17/1971 (45 y.o. Male) Treating RN: Phillis Haggis Primary Care Physician: PATIENT, NO Other Clinician: Referring Physician: Treating Physician/Extender: Altamese [place] in Treatment: 18 Education Assessment Education Provided To:  Patient Education Topics Provided Wound/Skin Impairment: Handouts: Other: change dressing as ordered Methods: Demonstration, Explain/Verbal Responses: State content correctly Electronic Signature(s) Signed: 12/23/2015  4:42:28 PM By: Alejandro MullingPinkerton, Debra Entered By: Alejandro MullingPinkerton, Debra on 12/22/2015 15:09:57 [name], [name] ([id number]) -------------------------------------------------------------------------------- Wound Assessment Details Patient Name: [name], [name] Date of Service: 12/22/2015 2:15 PM Medical Record Number: [id number] Patient Account Number: [id number] Date of Birth/Sex: 1971-03-14 [age] y.o. Male) Treating RN: Phillis HaggisPinkerton, Debi Primary Care Physician: PATIENT, NO Other Clinician: Referring Physician: Treating Physician/Extender: Maxwell CaulOBSON, MICHAEL G Weeks in Treatment: 18 Wound Status Wound Number: 1 Primary Etiology: Pressure Ulcer Wound Location: Left Coccyx Wound Status: Open Wounding Event: Pressure Injury Comorbid History: Paraplegia Date Acquired: 12/24/2014 Weeks Of Treatment: 18 Clustered Wound: No Photos Photo Uploaded By: Alejandro MullingPinkerton, Debra on 12/23/2015 09:23:36 Wound Measurements Length: (cm) 0.2 % Reduction i Width: (cm) 0.8 % Reduction i Depth: (cm) 0.4 Epithelializa Area: (cm) 0.126 Tunneling: Volume: (cm) 0.05 Undermining: Starting P Maximum Di n Area: 88.1% n Volume: 90.6% tion: None No Yes osition (o'clock): 9 stance: (cm) 0.4 Wound Description Classification: Category/Stage III Foul Odor Af Wound Margin: Thickened Due to Produ Exudate Amount: Large Exudate Type: Serous Exudate Color: amber ter Cleansing: Yes ct Use: No Wound Bed Granulation Amount: Medium (34-66%) Exposed Structure [name], [name] ([id number]) Granulation Quality: Pink Fascia Exposed: No Necrotic Amount: Medium (34-66%) Fat Layer Exposed: Yes Necrotic Quality: Adherent Slough Tendon Exposed: No Muscle Exposed: No Joint Exposed: No Bone Exposed: No Periwound Skin Texture Texture Color No Abnormalities Noted: No No Abnormalities Noted: No Moisture Temperature / Pain No Abnormalities Noted: No Temperature: No Abnormality Dry / Scaly: No Maceration: Yes Moist: Yes Wound  Preparation Ulcer Cleansing: Rinsed/Irrigated with Saline Topical Anesthetic Applied: Other: lidocaine 4%, Treatment Notes Wound #1 (Left Coccyx) 1. Cleansed with: Clean wound with Normal Saline 2. Anesthetic Topical Lidocaine 4% cream to wound bed prior to debridement 3. Peri-wound Care: Skin Prep 4. Dressing Applied: Hydrafera Blue 5. Secondary Dressing Applied Foam Tegaderm Electronic Signature(s) Signed: 12/23/2015 4:42:28 PM By: Alejandro MullingPinkerton, Debra Entered By: Alejandro MullingPinkerton, Debra on 12/22/2015 14:53:06 [name], [name] ([id number]) -------------------------------------------------------------------------------- Vitals Details Patient Name: [name], Jernard Date of Service: 12/22/2015 2:15 PM Medical Record Number: [id number] Patient Account Number: [id number] Date of Birth/Sex: 1971-03-14 [age] y.o. Male) Treating RN: Phillis HaggisPinkerton, Debi Primary Care Physician: PATIENT, NO Other Clinician: Referring Physician: Treating Physician/Extender: Altamese CarolinaOBSON, MICHAEL G Weeks in Treatment: 18 Vital Signs Time Taken: 14:43 Temperature (F): 98.1 Height (in): 67 Pulse (bpm): 92 Weight (lbs): 147 Respiratory Rate (breaths/min): 20 Body Mass Index (BMI): 23 Blood Pressure (mmHg): 115/78 Reference Range: 80 - 120 mg / dl Electronic Signature(s) Signed: 12/23/2015 4:42:28 PM By: Alejandro MullingPinkerton, Debra Entered By: Alejandro MullingPinkerton, Debra on 12/22/2015 14:43:14

## 2015-12-29 ENCOUNTER — Encounter: Payer: Worker's Compensation | Attending: Internal Medicine | Admitting: Internal Medicine

## 2015-12-29 DIAGNOSIS — Z8249 Family history of ischemic heart disease and other diseases of the circulatory system: Secondary | ICD-10-CM | POA: Diagnosis not present

## 2015-12-29 DIAGNOSIS — L89153 Pressure ulcer of sacral region, stage 3: Secondary | ICD-10-CM | POA: Diagnosis not present

## 2015-12-29 DIAGNOSIS — F17210 Nicotine dependence, cigarettes, uncomplicated: Secondary | ICD-10-CM | POA: Diagnosis not present

## 2015-12-29 DIAGNOSIS — G8222 Paraplegia, incomplete: Secondary | ICD-10-CM | POA: Diagnosis not present

## 2015-12-30 NOTE — Progress Notes (Addendum)
DORRIS, VANGORDER ([id number]) Visit Report for 12/29/2015 Arrival Information Details Patient Name: [name], [name] Date of Service: 12/29/2015 2:15 PM Medical Record Number: [id number] Patient Account Number: [id number] Date of Birth/Sex: February 14, 1971 (45 y.o. Male) Treating RN: Huel Coventry Primary Care Physician: PATIENT, NO Other Clinician: Referring Physician: Treating Physician/Extender: Altamese [place] in Treatment: 19 Visit Information History Since Last Visit Added or deleted any medications: No Patient Arrived: Wheel Chair Any new allergies or adverse reactions: No Arrival Time: 14:35 Had a fall or experienced change in No activities of daily living that may affect Accompanied By: self risk of falls: Transfer Assistance: None Signs or symptoms of abuse/neglect since last No Patient Identification Verified: Yes visito Secondary Verification Process Yes Hospitalized since last visit: No Completed: Has Dressing in Place as Prescribed: Yes Patient Requires Transmission-Based No Pain Present Now: No Precautions: Patient Has Alerts: No Electronic Signature(s) Signed: 12/29/2015 5:55:03 PM By: Elliot Gurney, RN, BSN, Kim RN, BSN Entered By: Elliot Gurney, RN, BSN, Kim on 12/29/2015 14:36:03 [name] ([id number]) -------------------------------------------------------------------------------- Encounter Discharge Information Details Patient Name: [name] Date of Service: 12/29/2015 2:15 PM Medical Record Number: [id number] Patient Account Number: [id number] Date of Birth/Sex: 1970/06/11 (45 y.o. Male) Treating RN: Huel Coventry Primary Care Physician: PATIENT, NO Other Clinician: Referring Physician: Treating Physician/Extender: Altamese [place] in Treatment: 24 Encounter Discharge Information Items Discharge Pain Level: 0 Discharge Condition: Stable Ambulatory Status: Wheelchair Discharge Destination: Home Transportation: Private Auto Accompanied By: self Schedule  Follow-up Appointment: Yes Medication Reconciliation completed Yes and provided to Patient/Care Aurilla Coulibaly: Provided on Clinical Summary of Care: 12/29/2015 Form Type Recipient Paper Patient [facility] Electronic Signature(s) Signed: 12/29/2015 5:55:03 PM By: Elliot Gurney, RN, BSN, Kim RN, BSN Previous Signature: 12/29/2015 3:05:15 PM Version By: Gwenlyn Perking Entered By: Elliot Gurney RN, BSN, Kim on 12/29/2015 15:10:47 [name] ([id number]) -------------------------------------------------------------------------------- Multi Wound Chart Details Patient Name: [name] Date of Service: 12/29/2015 2:15 PM Medical Record Number: [id number] Patient Account Number: [id number] Date of Birth/Sex: 1971/03/08 (45 y.o. Male) Treating RN: Huel Coventry Primary Care Physician: PATIENT, NO Other Clinician: Referring Physician: Treating Physician/Extender: Maxwell Caul Weeks in Treatment: 19 Vital Signs Height(in): 67 Pulse(bpm): 88 Weight(lbs): 147 Blood Pressure 120/82 (mmHg): Body Mass Index(BMI): 23 Temperature(F): 97.6 Respiratory Rate 18 (breaths/min): Photos: [1:No Photos] [N/A:N/A] Wound Location: [1:Left Coccyx] [N/A:N/A] Wounding Event: [1:Pressure Injury] [N/A:N/A] Primary Etiology: [1:Pressure Ulcer] [N/A:N/A] Comorbid History: [1:Paraplegia] [N/A:N/A] Date Acquired: [1:12/24/2014] [N/A:N/A] Weeks of Treatment: [1:19] [N/A:N/A] Wound Status: [1:Open] [N/A:N/A] Measurements L x W x D 0.2x1x0.3 [N/A:N/A] (cm) Area (cm) : [1:0.157] [N/A:N/A] Volume (cm) : [1:0.047] [N/A:N/A] % Reduction in Area: [1:85.20%] [N/A:N/A] % Reduction in Volume: 91.10% [N/A:N/A] Classification: [1:Category/Stage III] [N/A:N/A] Exudate Amount: [1:Medium] [N/A:N/A] Exudate Type: [1:Serous] [N/A:N/A] Exudate Color: [1:amber] [N/A:N/A] Foul Odor After [1:Yes] [N/A:N/A] Cleansing: Odor Anticipated Due to No [N/A:N/A] Product Use: Wound Margin: [1:Thickened] [N/A:N/A] Granulation Amount: [1:Medium  (34-66%)] [N/A:N/A] Granulation Quality: [1:Pink] [N/A:N/A] Necrotic Amount: [1:Medium (34-66%)] [N/A:N/A] Exposed Structures: [1:Fat: Yes Fascia: No Tendon: No Muscle: No] [N/A:N/A] Joint: No Bone: No Epithelialization: None N/A N/A Periwound Skin Texture: No Abnormalities Noted N/A N/A Periwound Skin Maceration: Yes N/A N/A Moisture: Moist: Yes Dry/Scaly: No Periwound Skin Color: No Abnormalities Noted N/A N/A Temperature: No Abnormality N/A N/A Tenderness on No N/A N/A Palpation: Wound Preparation: Ulcer Cleansing: N/A N/A Rinsed/Irrigated with Saline Topical Anesthetic Applied: Other: lidocaine 4% Treatment Notes Electronic Signature(s) Signed: 12/29/2015 5:55:03 PM By: Elliot Gurney, RN, BSN, Kim RN, BSN Entered By: Elliot Gurney, RN, BSN, Kim on 12/29/2015 14:55:53 [name] ([id number]) --------------------------------------------------------------------------------  Multi-Disciplinary Care Plan Details Patient Name: Matthew EvangelistHARRIS, Matthew Date of Service: 12/29/2015 2:15 PM Medical Record Number: [id number] Patient Account Number: [id number] Date of Birth/Sex: 10/19/1970 7(45 y.o. Male) Treating RN: Huel CoventryWoody, Kim Primary Care Physician: PATIENT, NO Other Clinician: Referring Physician: Treating Physician/Extender: Altamese CarolinaOBSON, MICHAEL G Weeks in Treatment: 1919 Active Inactive Abuse / Safety / Falls / Self Care Management Nursing Diagnoses: Potential for falls Goals: Patient will remain injury free Date Initiated: 08/17/2015 Goal Status: Active Interventions: Assess fall risk on admission and as needed Notes: Nutrition Nursing Diagnoses: Imbalanced nutrition Goals: Patient/caregiver agrees to and verbalizes understanding of need to use nutritional supplements and/or vitamins as prescribed Date Initiated: 08/17/2015 Goal Status: Active Interventions: Assess patient nutrition upon admission and as needed per policy Notes: Orientation to the Wound Care Program Nursing  Diagnoses: Knowledge deficit related to the wound healing center program Goals: Patient/caregiver will verbalize understanding of the Wound Healing Center Program Matthew EvangelistHARRIS, Matthew ([id number]) Date Initiated: 08/17/2015 Goal Status: Active Interventions: Provide education on orientation to the wound center Notes: Pressure Nursing Diagnoses: Knowledge deficit related to causes and risk factors for pressure ulcer development Goals: Patient will remain free from development of additional pressure ulcers Date Initiated: 08/17/2015 Goal Status: Active Interventions: Assess: immobility, friction, shearing, incontinence upon admission and as needed Notes: Wound/Skin Impairment Nursing Diagnoses: Impaired tissue integrity Goals: Ulcer/skin breakdown will have a volume reduction of 30% by week 4 Date Initiated: 08/17/2015 Goal Status: Active Ulcer/skin breakdown will have a volume reduction of 50% by week 8 Date Initiated: 08/17/2015 Goal Status: Active Ulcer/skin breakdown will have a volume reduction of 80% by week 12 Date Initiated: 08/17/2015 Goal Status: Active Interventions: Assess patient/caregiver ability to obtain necessary supplies Assess ulceration(s) every visit Notes: Electronic Signature(s) Signed: 12/29/2015 5:55:03 PM By: Elliot GurneyWoody, RN, BSN, Kim RN, BSN [place], [name] ([id number]) Entered By: Elliot GurneyWoody, RN, BSN, Kim on 12/29/2015 14:55:45 Matthew EvangelistHARRIS, [name] ([id number]) -------------------------------------------------------------------------------- Pain Assessment Details Patient Name: Matthew EvangelistHARRIS, [name] Date of Service: 12/29/2015 2:15 PM Medical Record Number: [id number] Patient Account Number: [id number] Date of Birth/Sex: 10/19/1970 [age] y.o. Male) Treating RN: Huel CoventryWoody, Kim Primary Care Physician: PATIENT, NO Other Clinician: Referring Physician: Treating Physician/Extender: Altamese CarolinaOBSON, MICHAEL G Weeks in Treatment: 19 Active Problems Location of Pain Severity and Description of  Pain Patient Has Paino No Site Locations With Dressing Change: No Pain Management and Medication Current Pain Management: Electronic Signature(s) Signed: 12/29/2015 5:55:03 PM By: Elliot GurneyWoody, RN, BSN, Kim RN, BSN Entered By: Elliot GurneyWoody, RN, BSN, Kim on 12/29/2015 14:37:00 Matthew EvangelistHARRIS, [name] ([id number]) -------------------------------------------------------------------------------- Patient/Caregiver Education Details Patient Name: Matthew EvangelistHARRIS, Matthew Date of Service: 12/29/2015 2:15 PM Medical Record Number: [id number] Patient Account Number: [id number] Date of Birth/Gender: 10/19/1970 5(45 y.o. Male) Treating RN: Huel CoventryWoody, Kim Primary Care Physician: PATIENT, NO Other Clinician: Referring Physician: Treating Physician/Extender: Altamese CarolinaOBSON, MICHAEL G Weeks in Treatment: 3319 Education Assessment Education Provided To: Patient Education Topics Provided Wound/Skin Impairment: Handouts: Caring for Your Ulcer, Other: continue wound care as prescribed Methods: Demonstration Responses: State content correctly Electronic Signature(s) Signed: 12/29/2015 5:55:03 PM By: Elliot GurneyWoody, RN, BSN, Kim RN, BSN Entered By: Elliot GurneyWoody, RN, BSN, Kim on 12/29/2015 15:11:07 Matthew EvangelistHARRIS, [name] ([id number]) -------------------------------------------------------------------------------- Wound Assessment Details Patient Name: Matthew EvangelistHARRIS, [name] Date of Service: 12/29/2015 2:15 PM Medical Record Number: [id number] Patient Account Number: [id number] Date of Birth/Sex: 10/19/1970 [age] y.o. Male) Treating RN: Huel CoventryWoody, Kim Primary Care Physician: PATIENT, NO Other Clinician: Referring Physician: Treating Physician/Extender: Maxwell CaulOBSON, MICHAEL G Weeks in Treatment: 19 Wound Status Wound Number: 1 Primary Etiology: Pressure Ulcer Wound Location: Left Coccyx Wound Status: Open Wounding  Event: Pressure Injury Comorbid History: Paraplegia Date Acquired: 12/24/2014 Weeks Of Treatment: 19 Clustered Wound: No Photos Wound Measurements Length: (cm) 0.2 Width:  (cm) 1 Depth: (cm) 0.3 Area: (cm) 0.157 Volume: (cm) 0.047 % Reduction in Area: 85.2% % Reduction in Volume: 91.1% Epithelialization: None Tunneling: No Undermining: No Wound Description Classification: Category/Stage III Wound Margin: Thickened Exudate Amount: Medium Exudate Type: Serous Exudate Color: amber Foul Odor After Cleansing: Yes Due to Product Use: No Wound Bed Granulation Amount: Medium (34-66%) Exposed Structure Granulation Quality: Pink Fascia Exposed: No Necrotic Amount: Medium (34-66%) Fat Layer Exposed: Yes Necrotic Quality: Adherent Slough Tendon Exposed: No Muscle Exposed: No [name], [name] ([id number]) Joint Exposed: No Bone Exposed: No Periwound Skin Texture Texture Color No Abnormalities Noted: No No Abnormalities Noted: No Moisture Temperature / Pain No Abnormalities Noted: No Temperature: No Abnormality Dry / Scaly: No Maceration: Yes Moist: Yes Wound Preparation Ulcer Cleansing: Rinsed/Irrigated with Saline Topical Anesthetic Applied: Other: lidocaine 4%, Treatment Notes Wound #1 (Left Coccyx) 1. Cleansed with: Clean wound with Normal Saline 4. Dressing Applied: Hydrafera Blue 5. Secondary Dressing Applied Bordered Foam Dressing Thin film Electronic Signature(s) Signed: 12/29/2015 5:55:03 PM By: Elliot Gurney, RN, BSN, Kim RN, BSN Entered By: Elliot Gurney, RN, BSN, Kim on 12/29/2015 16:09:26 [name] ([id number]) -------------------------------------------------------------------------------- Vitals Details Patient Name: [name] Date of Service: 12/29/2015 2:15 PM Medical Record Number: [id number] Patient Account Number: [id number] Date of Birth/Sex: 1970-06-04 (44 y.o. Male) Treating RN: Huel Coventry Primary Care Physician: PATIENT, NO Other Clinician: Referring Physician: Treating Physician/Extender: Altamese [place] in Treatment: 19 Vital Signs Time Taken: 14:37 Temperature (F): 97.6 Height (in): 67 Pulse (bpm):  88 Weight (lbs): 147 Respiratory Rate (breaths/min): 18 Body Mass Index (BMI): 23 Blood Pressure (mmHg): 120/82 Reference Range: 80 - 120 mg / dl Electronic Signature(s) Signed: 12/29/2015 5:55:03 PM By: Elliot Gurney, RN, BSN, Kim RN, BSN Entered By: Elliot Gurney, RN, BSN, Kim on 12/29/2015 14:37:22

## 2015-12-30 NOTE — Progress Notes (Signed)
TAJAH, SCHREINER ([id number]) Visit Report for 12/29/2015 Chief Complaint Document Details Patient Name: [name], [name] Date of Service: 12/29/2015 2:15 PM Medical Record Patient Account Number: [id number] Number: Treating RN: Huel Coventry 08/19/1970 (45 y.o. Other Clinician: Date of Birth/Sex: Male) Treating ROBSON, MICHAEL Primary Care Physician: PATIENT, NO Physician/Extender: G Referring Physician: Weeks in Treatment: 22 Information Obtained from: Patient Chief Complaint 08/17/15; the patient is recently really relocated from [facility]. He is here to establish a wound care for a stage III ulcer on his lower sacral area. Electronic Signature(s) Signed: 12/30/2015 12:55:33 PM By: Baltazar Najjar MD Entered By: Baltazar Najjar on 12/29/2015 20:01:16 [name] ([id number]) -------------------------------------------------------------------------------- Debridement Details Patient Name: [name] Date of Service: 12/29/2015 2:15 PM Medical Record Patient Account Number: [id number] Number: Treating RN: Huel Coventry 1971-02-23 (45 y.o. Other Clinician: Date of Birth/Sex: Male) Treating ROBSON, MICHAEL Primary Care Physician: PATIENT, NO Physician/Extender: G Referring Physician: Weeks in Treatment: 19 Debridement Performed for Wound #1 Left Coccyx Assessment: Performed By: Physician Maxwell Caul, MD Debridement: Debridement Pre-procedure Yes - 14:50 Verification/Time Out Taken: Start Time: 14:56 Pain Control: Other : lidocaine 4% Level: Skin/Subcutaneous Tissue Total Area Debrided (L x 0.2 (cm) x 1 (cm) = 0.2 (cm) W): Tissue and other Viable, Non-Viable, Subcutaneous material debrided: Instrument: Curette Bleeding: Minimum Hemostasis Achieved: Pressure End Time: 14:57 Procedural Pain: Insensate Post Procedural Pain: Insensate Response to Treatment: Procedure was tolerated well Post Debridement Measurements of Total  Wound Length: (cm) 0.2 Stage: Category/Stage III Width: (cm) 1 Depth: (cm) 0.3 Volume: (cm) 0.047 Character of Wound/Ulcer Post Improved Debridement: Severity of Tissue Post Fat layer exposed Debridement: Post Procedure Diagnosis Same as Pre-procedure Electronic Signature(s) Signed: 12/30/2015 10:01:40 AM By: Elliot Gurney, RN, BSN, Kim RN, BSN [place]ad[place], [place] ([id number]) Signed: 12/30/2015 12:55:33 PM By: Baltazar Najjar MD Previous Signature: 12/29/2015 5:55:03 PM Version By: Elliot Gurney RN, BSN, Kim RN, BSN Entered By: Baltazar Najjar on 12/29/2015 20:01:03 BRIGHTEN, ORNDOFF ([id number]) -------------------------------------------------------------------------------- HPI Details Patient Name: [name] Date of Service: 12/29/2015 2:15 PM Medical Record Patient Account Number: [id number] Number: Treating RN: Huel Coventry 29-Mar-1971 (45 y.o. 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 He was diagnosed with what sounds like a stage III pressure ulcer in September 2016 although this may have been present for some time longer than that perhaps July 2016. He was followed by wound care in [place]. By his description there were using Silvadene cream for a period of time and then 3 months ago change to Aquacel Ag which she is continued and changes daily with help from his mother. He has recently relocated to [facility]. He went to urgent care to follow-up on his wound on 08/04/2015 they did a culture of this wound that showed Pseudomonas and MSSA. He was placed on an antibiotic but doesn't remember which one it is. He has continued using Aquacel Ag and a foam based cover. I  don't have any of his records from the wound care center in [place] although the patient states he did have an MRI that did not show osteomyelitis. He is not aware of having a bone culture done and doesn't think that he had exposed bone on this and any point. The patient does in and out cath's  and has no urinary incontinence issues. He is on appropriate bowel regimen which she administers suppositories. He is able to position himself appropriately and is wheelchair to try and avoid pressure over this wound 08/24/15; small horizontal wound over his lower sacral area. Requires debridement of surface slough and nonviable subcutaneous tissue. There is a small amount of undermining noted no evidence of infection. On the right side of this wound there is some senescent rolled edges which may need debridement I did not do this today. 09/01/15 this is a small wound over the lower sacral area. It has tunneling superiorly. This goes roughly 1 cm. Nonviable subcutaneous tissue at the inferior aspect of the wound base. No real evidence of infection 09/07/15; small horizontal wound over the lower sacral area. Last week it had 1 cm of superior tunneling which appears to of resolved. No evidence of infection 09/21/15; continues to have a small horizontal wound over the lower sacral area. We have been using Prisma and this has contracted. He tells me that his mother is aware medication not able to change the dressing although he states that he is able to do this 09/28/15; a small horizontal wound over his lower sacral area is all that is left of this wound which was probably much larger and deeper at one point. Depth today at 0.7 cm. Base of this had some nonviable subcutaneous tissue that I removed. Appears to be stalling on current dressing which is Prisma 10/19/15; I have not seen this patient in several weeks. He arrives today with a linear/horizontal wound across his lower sacral area. Considerable depth of  this wound. Extensive debridement done 11/17/15; patient arrives for two-week visit. He is not doing well wound is bigger with odor and drainage. Has been using Aquacel Ag. I have not seen this in over a month although I think he was seen by the physician covering me 2 weeks ago on 11/05/15 11/24/15; I brought this man back this week to review the wound after last week's deterioration. Culture grew Enterobacter and some staph aureus [MSSA]. I had ordered him Cipro but we don't seem to of been able to communicate with the patient therefore we've called in today. He is not been systemically unwell. Still using COPPER, BASNETT ([id number]) Aquacel 12/15/15; very little change in this wound since the last time I saw it. Horizontal wound over the lower sacral area probably about 2 cm in width and 0.8 cm in depth. There is no evidence of infection. There appears to be callus around this. In spite of the patient's best efforts I think there is an adequate pressure relief here. No current evidence of infection 12/22/15; horizontal pressure ulcer over the lower sacral area. Appears to be filling in and has better dimensions in appearance today. Still circumferential callus which would suggest inadequate offloading although everything else appears to be improved 12/29/15 horizontal pressure wound over the lower sacral area. Appears to be filling in however skin groin down into the divot. No evidence of infection Electronic Signature(s) Signed: 12/30/2015 12:55:33 PM By: Baltazar Najjar MD Entered By: Baltazar Najjar on 12/29/2015 20:01:54 YAN, PANKRATZ ([id number]) -------------------------------------------------------------------------------- Physical Exam Details Patient Name: [name] Date of Service: 12/29/2015 2:15 PM Medical Record Patient Account Number: [id number] Number: Treating RN: Huel Coventry [birth year] (45 y.o. Other Clinician: Date of Birth/Sex: Male) Treating ROBSON,  MICHAEL Primary Care Physician: PATIENT, NO Physician/Extender: G Referring Physician: Weeks in Treatment: 19 Constitutional Sitting or standing Blood Pressure is within target range for patient.Marland Kitchen  Pulse regular and within target range for patient.Marland Kitchen Respirations regular, non-labored and within target range.. Temperature is normal and within the target range for the patient.. Patient's appearance is neat and clean. Appears in no acute distress. Well nourished and well developed.. Notes Wound exam; linear wound horizontally in the lower sacrum and coccyx. There is still some callus. Skin appears on one side to be growing down into the wound. Debridement done of surface slough and attempt to get a better granulation base Electronic Signature(s) Signed: 12/30/2015 12:55:33 PM By: Baltazar Najjar MD Entered By: Baltazar Najjar on 12/29/2015 20:03:34 [name] ([id number]) -------------------------------------------------------------------------------- Physician Orders Details Patient Name: [name] Date of Service: 12/29/2015 2:15 PM Medical Record Patient Account Number: [id number] Number: Treating RN: Huel Coventry Feb 14, 1971 (45 y.o. Other Clinician: Date of Birth/Sex: Male) Treating ROBSON, MICHAEL Primary Care Physician: PATIENT, NO Physician/Extender: G Referring Physician: Weeks in Treatment: 11 Verbal / Phone Orders: Yes Clinician: Huel Coventry Read Back and Verified: Yes Diagnosis Coding Wound Cleansing Wound #1 Left Coccyx o Clean wound with Normal Saline. Anesthetic Wound #1 Left Coccyx o Topical Lidocaine 4% cream applied to wound bed prior to debridement Skin Barriers/Peri-Wound Care Wound #1 Left Coccyx o Skin Prep Primary Wound Dressing o Hydrafera Blue - pack into wound Secondary Dressing Wound #1 Left Coccyx o Foam o Tegaderm Dressing Change Frequency Wound #1 Left Coccyx o Change dressing every other day. Follow-up  Appointments Wound #1 Left Coccyx o Return Appointment in 1 week. Off-Loading Wound #1 Left Coccyx o Turn and reposition every 2 hours Additional Orders / Instructions TAMARA, KENYON ([id number]) Wound #1 Left Coccyx o Increase protein intake. Medications-please add to medication list. Wound #1 Left Coccyx o Other: - Vitamin C, A and Zinc Electronic Signature(s) Signed: 12/29/2015 5:55:03 PM By: Elliot Gurney RN, BSN, Kim RN, BSN Signed: 12/30/2015 12:55:33 PM By: Baltazar Najjar MD Entered By: Elliot Gurney, RN, BSN, Kim on 12/29/2015 15:04:09 HERNDON, GRILL ([id number]) -------------------------------------------------------------------------------- Problem List Details Patient Name: [name] Date of Service: 12/29/2015 2:15 PM Medical Record Patient Account Number: [id number] Number: Treating RN: Huel Coventry May 05, 1970 (45 y.o. Other Clinician: Date of Birth/Sex: Male) Treating ROBSON, MICHAEL Primary Care Physician: PATIENT, NO Physician/Extender: G Referring Physician: Weeks in Treatment: 50 Active Problems ICD-10 Encounter Code Description Active Date Diagnosis L89.153 Pressure ulcer of sacral region, stage 3 08/17/2015 Yes G82.22 Paraplegia, incomplete 08/17/2015 Yes Inactive Problems Resolved Problems Electronic Signature(s) Signed: 12/30/2015 12:55:33 PM By: Baltazar Najjar MD Entered By: Baltazar Najjar on 12/29/2015 20:00:45 [name] ([id number]) -------------------------------------------------------------------------------- Progress Note Details Patient Name: [name] Date of Service: 12/29/2015 2:15 PM Medical Record Patient Account Number: [id number] Number: Treating RN: Huel Coventry 1971-04-10 (45 y.o. Other Clinician: Date of Birth/Sex: Male) Treating ROBSON, MICHAEL Primary Care Physician: PATIENT, NO Physician/Extender: G Referring Physician: Weeks in Treatment: 44 Subjective Chief Complaint Information obtained from  Patient 08/17/15; the patient is recently really relocated from [facility]. He is here to establish a wound care for a stage III ulcer on his lower sacral area. 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 He was diagnosed with what sounds like a stage III pressure ulcer in September 2016 although this may have been present for some time longer than that perhaps July 2016. He was followed by wound care in [place]. By his description there were using Silvadene cream for a period of time and then  3 months ago change to Aquacel Ag which she is continued and changes daily with help from his mother. He has recently relocated to [facility]. He went to urgent care to follow- up on his wound on 08/04/2015 they did a culture of this wound that showed Pseudomonas and MSSA. He was placed on an antibiotic but doesn't remember which one it is. He has continued using Aquacel Ag and a foam based cover. I don't have any of his records from the wound care center in [place] although the patient states he did have an MRI that did not show osteomyelitis. He is not aware of having a bone culture done and doesn't think that he had exposed bone on this and any point. The patient does in and out cath's and has no urinary incontinence issues. He is on appropriate bowel regimen which she administers suppositories. He is able to position himself appropriately and is wheelchair to try and avoid pressure over this wound 08/24/15; small horizontal wound over his lower sacral area. Requires debridement of surface slough and nonviable subcutaneous tissue. There is a small amount of undermining noted no evidence of infection. On the right side of this wound there is some senescent rolled edges which may need debridement I did not do this today. 09/01/15 this is a small wound over  the lower sacral area. It has tunneling superiorly. This goes roughly 1 cm. Nonviable subcutaneous tissue at the inferior aspect of the wound base. No real evidence of infection 09/07/15; small horizontal wound over the lower sacral area. Last week it had 1 cm of superior tunneling which appears to of resolved. No evidence of infection 09/21/15; continues to have a small horizontal wound over the lower sacral area. We have been using Prisma and this has contracted. He tells me that his mother is aware medication not able to change the dressing although he states that he is able to do this 09/28/15; a small horizontal wound over his lower sacral area is all that is left of this wound which was probably much larger and deeper at one point. Depth today at 0.7 cm. Base of this had some nonviable subcutaneous tissue that I removed. Appears to be stalling on current dressing which is Prisma 10/19/15; I have not seen this patient in several weeks. He arrives today with a linear/horizontal wound HEZAKIAH, CHAMPEAU ([id number]) across his lower sacral area. Considerable depth of this wound. Extensive debridement done 11/17/15; patient arrives for two-week visit. He is not doing well wound is bigger with odor and drainage. Has been using Aquacel Ag. I have not seen this in over a month although I think he was seen by the physician covering me 2 weeks ago on 11/05/15 11/24/15; I brought this man back this week to review the wound after last week's deterioration. Culture grew Enterobacter and some staph aureus [MSSA]. I had ordered him Cipro but we don't seem to of been able to communicate with the patient therefore we've called in today. He is not been systemically unwell. Still using Aquacel 12/15/15; very little change in this wound since the last time I saw it. Horizontal wound over the lower sacral area probably about 2 cm in width and 0.8 cm in depth. There is no evidence of infection. There appears to be callus  around this. In spite of the patient's best efforts I think there is an adequate pressure relief here. No current evidence of infection 12/22/15; horizontal pressure ulcer over the lower sacral area. Appears  to be filling in and has better dimensions in appearance today. Still circumferential callus which would suggest inadequate offloading although everything else appears to be improved 12/29/15 horizontal pressure wound over the lower sacral area. Appears to be filling in however skin groin down into the divot. No evidence of infection Objective Constitutional Sitting or standing Blood Pressure is within target range for patient.. Pulse regular and within target range for patient.Marland Kitchen Respirations regular, non-labored and within target range.. Temperature is normal and within the target range for the patient.. Patient's appearance is neat and clean. Appears in no acute distress. Well nourished and well developed.. Vitals Time Taken: 2:37 PM, Height: 67 in, Weight: 147 lbs, BMI: 23, Temperature: 97.6 F, Pulse: 88 bpm, Respiratory Rate: 18 breaths/min, Blood Pressure: 120/82 mmHg. General Notes: Wound exam; linear wound horizontally in the lower sacrum and coccyx. There is still some callus. Skin appears on one side to be growing down into the wound. Debridement done of surface slough and attempt to get a better granulation base Integumentary (Hair, Skin) Wound #1 status is Open. Original cause of wound was Pressure Injury. The wound is located on the Left Coccyx. The wound measures 0.2cm length x 1cm width x 0.3cm depth; 0.157cm^2 area and 0.047cm^3 volume. There is fat exposed. There is no tunneling or undermining noted. There is a medium amount of serous drainage noted. The wound margin is thickened. There is medium (34-66%) pink granulation within the wound bed. There is a medium (34-66%) amount of necrotic tissue within the wound bed including Adherent Slough. The periwound skin appearance  exhibited: Maceration, Moist. The periwound skin appearance did not exhibit: Dry/Scaly. Periwound temperature was noted as No Abnormality. NNAEMEKA, SAMSON ([id number]) Assessment Active Problems ICD-10 L89.153 - Pressure ulcer of sacral region, stage 3 G82.22 - Paraplegia, incomplete Procedures Wound #1 Wound #1 is a Pressure Ulcer located on the Left Coccyx . There was a Skin/Subcutaneous Tissue Debridement ([id number]) debridement with total area of 0.2 sq cm performed by Maxwell Caul, MD. with the following instrument(s): Curette to remove Viable and Non-Viable tissue/material including Subcutaneous after achieving pain control using Other (lidocaine 4%). A time out was conducted at 14:50, prior to the start of the procedure. A Minimum amount of bleeding was controlled with Pressure. The procedure was tolerated well with a pain level of Insensate throughout and a pain level of Insensate following the procedure. Post Debridement Measurements: 0.2cm length x 1cm width x 0.3cm depth; 0.047cm^3 volume. Post debridement Stage noted as Category/Stage III. Character of Wound/Ulcer Post Debridement is improved. Severity of Tissue Post Debridement is: Fat layer exposed. Post procedure Diagnosis Wound #1: Same as Pre-Procedure Plan Wound Cleansing: Wound #1 Left Coccyx: Clean wound with Normal Saline. Anesthetic: Wound #1 Left Coccyx: Topical Lidocaine 4% cream applied to wound bed prior to debridement Skin Barriers/Peri-Wound Care: Wound #1 Left Coccyx: Skin Prep Primary Wound Dressing: Hydrafera Blue - pack into wound Secondary Dressing: DARIK, MASSING ([id number]) Wound #1 Left Coccyx: Foam Tegaderm Dressing Change Frequency: Wound #1 Left Coccyx: Change dressing every other day. Follow-up Appointments: Wound #1 Left Coccyx: Return Appointment in 1 week. Off-Loading: Wound #1 Left Coccyx: Turn and reposition every 2 hours Additional Orders / Instructions: Wound #1  Left Coccyx: Increase protein intake. Medications-please add to medication list.: Wound #1 Left Coccyx: Other: - Vitamin C, A and Zinc hydrofera blue oadvanced rx option still some depth Electronic Signature(s) Signed: 12/30/2015 12:55:33 PM By: Baltazar Najjar MD Entered By: Baltazar Najjar on 12/29/2015 20:09:55 Matson, Leonette Most (  [id number]) -------------------------------------------------------------------------------- SuperBill Details Patient Name: Matthew EvangelistHARRIS, Jameis Date of Service: 12/29/2015 Medical Record Patient Account Number: [id number] Number: Treating RN: Huel CoventryWoody, Kim [birth year] ([age] y.o. Other Clinician: Date of Birth/Sex: Male) Treating ROBSON, MICHAEL Primary Care Physician: PATIENT, NO Physician/Extender: G Referring Physician: Weeks in Treatment: 19 Diagnosis Coding ICD-10 Codes Code Description L89.153 Pressure ulcer of sacral region, stage 3 G82.22 Paraplegia, incomplete Facility Procedures CPT4 Code: [id number] Description: 11042 - DEB SUBQ TISSUE 20 SQ CM/< ICD-10 Description Diagnosis L89.153 Pressure ulcer of sacral region, stage 3 Modifier: Quantity: 1 Physician Procedures CPT4 Code: [id number] Description: 11042 - WC PHYS SUBQ TISS 20 SQ CM ICD-10 Description Diagnosis L89.153 Pressure ulcer of sacral region, stage 3 Modifier: Quantity: 1 Electronic Signature(s) Signed: 12/30/2015 12:55:33 PM By: Baltazar Najjarobson, Michael MD Entered By: Baltazar Najjarobson, Michael on 12/29/2015 20:10:10

## 2016-01-05 ENCOUNTER — Encounter: Payer: Worker's Compensation | Admitting: Internal Medicine

## 2016-01-05 DIAGNOSIS — L89153 Pressure ulcer of sacral region, stage 3: Secondary | ICD-10-CM | POA: Diagnosis not present

## 2016-01-06 NOTE — Progress Notes (Addendum)
[name], [place] ([id number]) Visit Report for 01/05/2016 Arrival Information Details Patient Name: [name], Cher Date of Service: 01/05/2016 2:15 PM Medical Record Number: [id number] Patient Account Number: [id number] Date of Birth/Sex: Oct 06, 1970 [age] y.o. Male) Treating RN: Curtis Sitesorthy, Joanna Primary Care Physician: PATIENT, NO Other Clinician: Referring Physician: Treating Physician/Extender: Altamese CarolinaOBSON, MICHAEL G Weeks in Treatment: 20 Visit Information History Since Last Visit Added or deleted any medications: No Patient Arrived: Wheel Chair Any new allergies or adverse reactions: No Arrival Time: 14:52 Had a fall or experienced change in No activities of daily living that may affect Accompanied By: self risk of falls: Transfer Assistance: None Signs or symptoms of abuse/neglect since last No Patient Identification Verified: Yes visito Secondary Verification Process Yes Hospitalized since last visit: No Completed: Pain Present Now: No Patient Requires Transmission-Based No Precautions: Patient Has Alerts: No Electronic Signature(s) Signed: 01/05/2016 5:51:34 PM By: Curtis Sitesorthy, Joanna Entered By: Curtis Sitesorthy, Joanna on 01/05/2016 14:52:22 [name], E[place] ([id number]) -------------------------------------------------------------------------------- Encounter Discharge Information Details Patient Name: [name], [place] Date of Service: 01/05/2016 2:15 PM Medical Record Number: [id number] Patient Account Number: [id number] Date of Birth/Sex: Oct 06, 1970 [age] y.o. Male) Treating RN: Curtis Sitesorthy, Joanna Primary Care Physician: PATIENT, NO Other Clinician: Referring Physician: Treating Physician/Extender: Altamese CarolinaOBSON, MICHAEL G Weeks in Treatment: 20 Encounter Discharge Information Items Discharge Pain Level: 0 Discharge Condition: Stable Ambulatory Status: Wheelchair Discharge Destination: Home Transportation: Private Auto Accompanied By: self Schedule Follow-up Appointment: Yes Medication  Reconciliation completed and provided to Patient/Care No Khadejah Son: Provided on Clinical Summary of Care: 01/05/2016 Form Type Recipient Paper [facility] Electronic Signature(s) Signed: 01/05/2016 3:49:19 PM By: Gwenlyn PerkingMoore, Shelia Entered By: Gwenlyn PerkingMoore, Shelia on 01/05/2016 15:49:18 [name], [place] ([id number]) -------------------------------------------------------------------------------- Multi Wound Chart Details Patient Name: [name], [place] Date of Service: 01/05/2016 2:15 PM Medical Record Number: [id number] Patient Account Number: [id number] Date of Birth/Sex: Oct 06, 1970 [age] y.o. Male) Treating RN: Curtis Sitesorthy, Joanna Primary Care Physician: PATIENT, NO Other Clinician: Referring Physician: Treating Physician/Extender: Maxwell CaulOBSON, MICHAEL G Weeks in Treatment: 20 Vital Signs Height(in): 67 Pulse(bpm): 99 Weight(lbs): 147 Blood Pressure 124/81 (mmHg): Body Mass Index(BMI): 23 Temperature(F): 98.3 Respiratory Rate 18 (breaths/min): Photos: [1:No Photos] [N/A:N/A] Wound Location: [1:Left Coccyx] [N/A:N/A] Wounding Event: [1:Pressure Injury] [N/A:N/A] Primary Etiology: [1:Pressure Ulcer] [N/A:N/A] Comorbid History: [1:Paraplegia] [N/A:N/A] Date Acquired: [1:12/24/2014] [N/A:N/A] Weeks of Treatment: [1:20] [N/A:N/A] Wound Status: [1:Open] [N/A:N/A] Measurements L x W x D 0.3x1.1x0.5 [N/A:N/A] (cm) Area (cm) : [1:0.259] [N/A:N/A] Volume (cm) : [1:0.13] [N/A:N/A] % Reduction in Area: [1:75.60%] [N/A:N/A] % Reduction in Volume: 75.50% [N/A:N/A] Classification: [1:Category/Stage III] [N/A:N/A] Exudate Amount: [1:Medium] [N/A:N/A] Exudate Type: [1:Serous] [N/A:N/A] Exudate Color: [1:amber] [N/A:N/A] Foul Odor After [1:Yes] [N/A:N/A] Cleansing: Odor Anticipated Due to No [N/A:N/A] Product Use: Wound Margin: [1:Thickened] [N/A:N/A] Granulation Amount: [1:Medium (34-66%)] [N/A:N/A] Granulation Quality: [1:Pink] [N/A:N/A] Necrotic Amount: [1:Medium (34-66%)] [N/A:N/A] Exposed  Structures: [1:Fat: Yes Fascia: No Tendon: No Muscle: No] [N/A:N/A] Joint: No Bone: No Epithelialization: None N/A N/A Periwound Skin Texture: No Abnormalities Noted N/A N/A Periwound Skin Maceration: Yes N/A N/A Moisture: Moist: Yes Dry/Scaly: No Periwound Skin Color: No Abnormalities Noted N/A N/A Temperature: No Abnormality N/A N/A Tenderness on No N/A N/A Palpation: Wound Preparation: Ulcer Cleansing: N/A N/A Rinsed/Irrigated with Saline Topical Anesthetic Applied: Other: lidocaine 4% Treatment Notes Electronic Signature(s) Signed: 01/05/2016 5:51:34 PM By: Curtis Sitesorthy, Joanna Entered By: Curtis Sitesorthy, Joanna on 01/05/2016 15:02:48 [name], Juanita ([id number]) -------------------------------------------------------------------------------- Multi-Disciplinary Care Plan Details Patient Name: [name], [place] Date of Service: 01/05/2016 2:15 PM Medical Record Number: [id number] Patient Account Number: [id number] Date of Birth/Sex: Oct 06, 1970 [age] y.o. Male) Treating RN: Curtis Sitesorthy, Joanna  Primary Care Physician: PATIENT, NO Other Clinician: Referring Physician: Treating Physician/Extender: Altamese [place] in Treatment: 20 Active Inactive Abuse / Safety / Falls / Self Care Management Nursing Diagnoses: Potential for falls Goals: Patient will remain injury free Date Initiated: 08/17/2015 Goal Status: Active Interventions: Assess fall risk on admission and as needed Notes: Nutrition Nursing Diagnoses: Imbalanced nutrition Goals: Patient/caregiver agrees to and verbalizes understanding of need to use nutritional supplements and/or vitamins as prescribed Date Initiated: 08/17/2015 Goal Status: Active Interventions: Assess patient nutrition upon admission and as needed per policy Notes: Orientation to the Wound Care Program Nursing Diagnoses: Knowledge deficit related to the wound healing center program Goals: Patient/caregiver will verbalize understanding of the Wound  Healing Center Program JEANLUC, WEGMAN ([id number]) Date Initiated: 08/17/2015 Goal Status: Active Interventions: Provide education on orientation to the wound center Notes: Pressure Nursing Diagnoses: Knowledge deficit related to causes and risk factors for pressure ulcer development Goals: Patient will remain free from development of additional pressure ulcers Date Initiated: 08/17/2015 Goal Status: Active Interventions: Assess: immobility, friction, shearing, incontinence upon admission and as needed Notes: Wound/Skin Impairment Nursing Diagnoses: Impaired tissue integrity Goals: Ulcer/skin breakdown will have a volume reduction of 30% by week 4 Date Initiated: 08/17/2015 Goal Status: Active Ulcer/skin breakdown will have a volume reduction of 50% by week 8 Date Initiated: 08/17/2015 Goal Status: Active Ulcer/skin breakdown will have a volume reduction of 80% by week 12 Date Initiated: 08/17/2015 Goal Status: Active Interventions: Assess patient/caregiver ability to obtain necessary supplies Assess ulceration(s) every visit Notes: Electronic Signature(s) Signed: 01/05/2016 5:51:34 PM By: Harriette Ohara, Leonette Most ([id number]) Entered By: Curtis Sites on 01/05/2016 15:02:40 Matthew Evangelist ([id number]) -------------------------------------------------------------------------------- Pain Assessment Details Patient Name: Matthew Evangelist Date of Service: 01/05/2016 2:15 PM Medical Record Number: [id number] Patient Account Number: [id number] Date of Birth/Sex: 02-02-1971 (45 y.o. Male) Treating RN: Curtis Sites Primary Care Physician: PATIENT, NO Other Clinician: Referring Physician: Treating Physician/Extender: Maxwell Caul Weeks in Treatment: 20 Active Problems Location of Pain Severity and Description of Pain Patient Has Paino No Site Locations Pain Management and Medication Current Pain Management: Electronic Signature(s) Signed: 01/05/2016 5:51:34 PM By:  Curtis Sites Entered By: Curtis Sites on 01/05/2016 14:52:29 Matthew Evangelist ([id number]) -------------------------------------------------------------------------------- Patient/Caregiver Education Details Patient Name: Matthew Evangelist Date of Service: 01/05/2016 2:15 PM Medical Record Number: [id number] Patient Account Number: [id number] Date of Birth/Gender: 1971-01-07 (45 y.o. Male) Treating RN: Curtis Sites Primary Care Physician: PATIENT, NO Other Clinician: Referring Physician: Treating Physician/Extender: Altamese  in Treatment: 20 Education Assessment Education Provided To: Patient Education Topics Provided Nutrition: Handouts: Nutrition Methods: Explain/Verbal Responses: State content correctly Electronic Signature(s) Signed: 01/05/2016 5:51:34 PM By: Curtis Sites Entered By: Curtis Sites on 01/05/2016 15:35:13 Matthew Evangelist ([id number]) -------------------------------------------------------------------------------- Wound Assessment Details Patient Name: Matthew Evangelist Date of Service: 01/05/2016 2:15 PM Medical Record Number: [id number] Patient Account Number: [id number] Date of Birth/Sex: 30-May-1970 (45 y.o. Male) Treating RN: Curtis Sites Primary Care Physician: PATIENT, NO Other Clinician: Referring Physician: Treating Physician/Extender: Maxwell Caul Weeks in Treatment: 20 Wound Status Wound Number: 1 Primary Etiology: Pressure Ulcer Wound Location: Left Coccyx Wound Status: Open Wounding Event: Pressure Injury Comorbid History: Paraplegia Date Acquired: 12/24/2014 Weeks Of Treatment: 20 Clustered Wound: No Photos Wound Measurements Length: (cm) 0.3 Width: (cm) 1.1 Depth: (cm) 0.5 Area: (cm) 0.259 Volume: (cm) 0.13 % Reduction in Area: 75.6% % Reduction in Volume: 75.5% Epithelialization: None Tunneling: No Undermining: No Wound Description Classification: Category/Stage III Wound Margin: Thickened Exudate  Amount: Medium Exudate Type: Serous  Exudate Color: amber Foul Odor After Cleansing: Yes Due to Product Use: No Wound Bed Granulation Amount: Medium (34-66%) Exposed Structure Granulation Quality: Pink Fascia Exposed: No Necrotic Amount: Medium (34-66%) Fat Layer Exposed: Yes Necrotic Quality: Adherent Slough Tendon Exposed: No Muscle Exposed: No RAFAY, DAHAN ([id number]) Joint Exposed: No Bone Exposed: No Periwound Skin Texture Texture Color No Abnormalities Noted: No No Abnormalities Noted: No Moisture Temperature / Pain No Abnormalities Noted: No Temperature: No Abnormality Dry / Scaly: No Maceration: Yes Moist: Yes Wound Preparation Ulcer Cleansing: Rinsed/Irrigated with Saline Topical Anesthetic Applied: Other: lidocaine 4%, Treatment Notes Wound #1 (Left Coccyx) 1. Cleansed with: Clean wound with Normal Saline 2. Anesthetic Topical Lidocaine 4% cream to wound bed prior to debridement 4. Dressing Applied: Hydrafera Blue 5. Secondary Dressing Applied Foam Tegaderm Electronic Signature(s) Signed: 01/05/2016 5:51:34 PM By: Curtis Sites Entered By: Curtis Sites on 01/05/2016 15:17:15 Matthew Evangelist ([id number]) -------------------------------------------------------------------------------- Vitals Details Patient Name: Matthew Evangelist Date of Service: 01/05/2016 2:15 PM Medical Record Number: [id number] Patient Account Number: [id number] Date of Birth/Sex: [birth year] (45 y.o. Male) Treating RN: Curtis Sites Primary Care Physician: PATIENT, NO Other Clinician: Referring Physician: Treating Physician/Extender: Altamese  in Treatment: 20 Vital Signs Time Taken: 14:52 Temperature (F): 98.3 Height (in): 67 Pulse (bpm): 99 Weight (lbs): 147 Respiratory Rate (breaths/min): 18 Body Mass Index (BMI): 23 Blood Pressure (mmHg): 124/81 Reference Range: 80 - 120 mg / dl Electronic Signature(s) Signed: 01/05/2016 5:51:34 PM By: Curtis Sites Entered By: Curtis Sites on 01/05/2016 14:52:55

## 2016-01-06 NOTE — Progress Notes (Signed)
BADEN, BETSCH ([id number]) Visit Report for 01/05/2016 Chief Complaint Document Details Patient Name: [name], [name] Date of Service: 01/05/2016 2:15 PM Medical Record Patient Account Number: [id number] Number: Treating RN: [name] [birth year] (45 y.o. Other Clinician: Date of Birth/Sex: Male) Treating [name] Primary Care Physician: PATIENT, NO Physician/Extender: G Referring Physician: Weeks in Treatment: 20 Information Obtained from: Patient Chief Complaint 08/17/15; the patient is recently really relocated from [facility]. He is here to establish a wound care for a stage III ulcer on his lower sacral area. Electronic Signature(s) Signed: 01/06/2016 7:42:49 AM By: Baltazar Najjar MD Entered By: Baltazar Najjar on 01/05/2016 18:30:29 [name], [name] ([id number]) -------------------------------------------------------------------------------- Debridement Details Patient Name: [name] Date of Service: 01/05/2016 2:15 PM Medical Record Patient Account Number: [id number] Number: Treating RN: [name] 1970-07-16 (45 y.o. Other Clinician: Date of Birth/Sex: Male) Treating [name] Primary Care Physician: PATIENT, NO Physician/Extender: G Referring Physician: Weeks in Treatment: 20 Debridement Performed for Wound #1 Left Coccyx Assessment: Performed By: Physician Matthew Caul, MD Debridement: Debridement Pre-procedure Yes - 15:30 Verification/Time Out Taken: Start Time: 15:31 Pain Control: Lidocaine 4% Topical Solution Level: Skin/Subcutaneous Tissue Total Area Debrided (L x 3 (cm) x 1.1 (cm) = 3.3 (cm) W): Tissue and other Viable, Non-Viable, Callus, Eschar, Exudate, Fibrin/Slough, Subcutaneous material debrided: Instrument: Curette Bleeding: Minimum Hemostasis Achieved: Pressure End Time: 15:34 Procedural Pain: 0 Post Procedural Pain: 0 Response to Treatment: Procedure was tolerated well Post  Debridement Measurements of Total Wound Length: (cm) 0.3 Stage: Category/Stage III Width: (cm) 1.1 Depth: (cm) 0.5 Volume: (cm) 0.13 Character of Wound/Ulcer Post Improved Debridement: Severity of Tissue Post Fat layer exposed Debridement: Post Procedure Diagnosis Same as Pre-procedure Electronic Signature(s) Signed: 01/06/2016 7:42:49 AM By: Baltazar Najjar MD [name] ([id number]) Signed: 01/06/2016 4:30:55 PM By: [name] Previous Signature: 01/05/2016 5:51:34 PM Version By: [name] Entered By: Baltazar Najjar on 01/05/2016 18:30:13 [name] ([id number]) -------------------------------------------------------------------------------- HPI Details Patient Name: [name] Date of Service: 01/05/2016 2:15 PM Medical Record Patient Account Number: [id number] Number: Treating RN: [name] [birth year] (45 y.o. 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 He was diagnosed with what sounds like a stage III pressure ulcer in September 2016 although this may have been present for some time longer than that perhaps July 2016. He was followed by wound care in [place]. By his description there were using Silvadene cream for a period of time and then 3 months ago change to Aquacel Ag which she is continued and changes daily with help from his mother. He has recently relocated to [facility]. He went to urgent care to follow-up on his wound on 08/04/2015 they did a culture of this wound that showed Pseudomonas and MSSA. He was placed on an antibiotic but doesn't remember which one it is. He has continued using Aquacel Ag and a foam  based cover. I don't have any of his records from the wound care center in [place] although the patient states he did have an MRI that did not show osteomyelitis. He is not aware of having a bone culture done and doesn't think that he had exposed bone on this and any point. The patient does in and out cath's and has no urinary  incontinence issues. He is on appropriate bowel regimen which she administers suppositories. He is able to position himself appropriately and is wheelchair to try and avoid pressure over this wound 08/24/15; small horizontal wound over his lower sacral area. Requires debridement of surface slough and nonviable subcutaneous tissue. There is a small amount of undermining noted no evidence of infection. On the right side of this wound there is some senescent rolled edges which may need debridement I did not do this today. 09/01/15 this is a small wound over the lower sacral area. It has tunneling superiorly. This goes roughly 1 cm. Nonviable subcutaneous tissue at the inferior aspect of the wound base. No real evidence of infection 09/07/15; small horizontal wound over the lower sacral area. Last week it had 1 cm of superior tunneling which appears to of resolved. No evidence of infection 09/21/15; continues to have a small horizontal wound over the lower sacral area. We have been using Prisma and this has contracted. He tells me that his mother is aware medication not able to change the dressing although he states that he is able to do this 09/28/15; a small horizontal wound over his lower sacral area is all that is left of this wound which was probably much larger and deeper at one point. Depth today at 0.7 cm. Base of this had some nonviable subcutaneous tissue that I removed. Appears to be stalling on current dressing which is Prisma 10/19/15; I have not seen this patient in several weeks. He arrives today with a linear/horizontal wound across his lower sacral area.  Considerable depth of this wound. Extensive debridement done 11/17/15; patient arrives for two-week visit. He is not doing well wound is bigger with odor and drainage. Has been using Aquacel Ag. I have not seen this in over a month although I think he was seen by the physician covering me 2 weeks ago on 11/05/15 11/24/15; I brought this man back this week to review the wound after last week's deterioration. Culture grew Enterobacter and some staph aureus [MSSA]. I had ordered him Cipro but we don't seem to of been able to communicate with the patient therefore we've called in today. He is not been systemically unwell. Still using DEJAUN, VIDRIO ([id number]) Aquacel 12/15/15; very little change in this wound since the last time I saw it. Horizontal wound over the lower sacral area probably about 2 cm in width and 0.8 cm in depth. There is no evidence of infection. There appears to be callus around this. In spite of the patient's best efforts I think there is an adequate pressure relief here. No current evidence of infection 12/22/15; horizontal pressure ulcer over the lower sacral area. Appears to be filling in and has better dimensions in appearance today. Still circumferential callus which would suggest inadequate offloading although everything else appears to be improved 12/29/15 horizontal pressure wound over the lower sacral area. Appears to be filling in however skin groin down into the divot. No evidence of infection 01/05/16; comes in today with not much change in this wound. There is surrounding callus indicative of pressure. Not much change in the wound bed. This clearly require debridement today we have been using Hydrofera Blue Electronic Signature(s) Signed: 01/06/2016 7:42:49 AM By: Baltazar Najjar MD Entered By: Baltazar Najjar on 01/05/2016 18:32:16 NAPHTALI, ZYWICKI ([id number]) -------------------------------------------------------------------------------- Physical Exam  Details Patient Name: [name] Date of Service: 01/05/2016 2:15 PM Medical Record Patient Account Number: [id number] Number: Treating RN: [name] 1971/04/01 (45 y.o. Other Clinician:  Date of Birth/Sex: Male) Treating [name] Primary Care Physician: PATIENT, NO Physician/Extender: G Referring Physician: Weeks in Treatment: 20 Constitutional Sitting or standing Blood Pressure is within target range for patient.. Pulse regular and within target range for patient.Marland Kitchen. Respirations regular, non-labored and within target range.. Temperature is normal and within the target range for the patient.. Patient appears well. Notes Wound exam; the patient has a linear horizontal pressure wound over his coccyx. This is never appeared infected. A callus around the wound and nonviable subcutaneous tissue all removed with a curette. Post debridement the rest of this really looks quite stable. The base of the wound does not appear to be infected. Think the issue here is unrelieved pressure Electronic Signature(s) Signed: 01/06/2016 7:42:49 AM By: Baltazar Najjarobson, Michael MD Entered By: Baltazar Najjarobson, Frank on 01/05/2016 18:34:22 [name], [name] ([id number]) -------------------------------------------------------------------------------- Physician Orders Details Patient Name: [name], [name] Date of Service: 01/05/2016 2:15 PM Medical Record Patient Account Number: [id number] Number: Treating RN: Matthew SitesDorthy, Joanna [birth year] (45 y.o. Other Clinician: Date of Birth/Sex: Male) Treating [name] Primary Care Physician: PATIENT, NO Physician/Extender: G Referring Physician: Weeks in Treatment: 20 Verbal / Phone Orders: Yes Clinician: Curtis Sitesorthy, Joanna Read Back and Verified: Yes Diagnosis Coding Wound Cleansing Wound #1 Left Coccyx o Clean wound with Normal Saline. Anesthetic Wound #1 Left Coccyx o Topical Lidocaine 4% cream applied to wound bed prior to  debridement Skin Barriers/Peri-Wound Care Wound #1 Left Coccyx o Skin Prep Primary Wound Dressing o Hydrafera Blue - pack into wound Secondary Dressing Wound #1 Left Coccyx o Foam o Tegaderm Dressing Change Frequency Wound #1 Left Coccyx o Change dressing every other day. Follow-up Appointments Wound #1 Left Coccyx o Return Appointment in 1 week. Off-Loading Wound #1 Left Coccyx o Turn and reposition every 2 hours Additional Orders / Instructions [name], Atzel ([id number]) Wound #1 Left Coccyx o Increase protein intake. Medications-please add to medication list. Wound #1 Left Coccyx o Other: - Vitamin C, A and Zinc Electronic Signature(s) Signed: 01/05/2016 5:51:34 PM By: Matthew Sitesorthy, Joanna Signed: 01/06/2016 7:42:49 AM By: Baltazar Najjarobson, Michael MD Entered By: Matthew Sitesorthy, Joanna on 01/05/2016 15:34:05 [name], [name] ([id number]) -------------------------------------------------------------------------------- Problem List Details Patient Name: [name], [name] Date of Service: 01/05/2016 2:15 PM Medical Record Patient Account Number: [id number] Number: Treating RN: Matthew SitesDorthy, Joanna [birth year] (45 y.o. Other Clinician: Date of Birth/Sex: Male) Treating [name] Primary Care Physician: PATIENT, NO Physician/Extender: G Referring Physician: Weeks in Treatment: 20 Active Problems ICD-10 Encounter Code Description Active Date Diagnosis L89.153 Pressure ulcer of sacral region, stage 3 08/17/2015 Yes G82.22 Paraplegia, incomplete 08/17/2015 Yes Inactive Problems Resolved Problems Electronic Signature(s) Signed: 01/06/2016 7:42:49 AM By: Baltazar Najjarobson, Michael MD Entered By: Baltazar Najjarobson, Frank on 01/05/2016 18:29:31 [name], [name] ([id number]) -------------------------------------------------------------------------------- Progress Note Details Patient Name: [name], [name] Date of Service: 01/05/2016 2:15 PM Medical Record Patient Account Number:  [id number] Number: Treating RN: Matthew SitesDorthy, Joanna [birth year] (45 y.o. Other Clinician: Date of Birth/Sex: Male) Treating [name] Primary Care Physician: PATIENT, NO Physician/Extender: G Referring Physician: Weeks in Treatment: 20 Subjective Chief Complaint Information obtained from Patient 08/17/15; the [facility] [place] He is here to establish a wound care for a stage III ulcer on his lower sacral area. 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 He was diagnosed with what sounds like a stage III pressure ulcer in September 2016 although this may have been present  for some time longer than that perhaps July 2016. He was followed by wound care in [place]. By his description there were using Silvadene cream for a period of time and then 3 months ago change to Aquacel Ag which she is continued and changes daily with help from his mother. He has recently relocated to [facility]. He went to urgent care to follow- up on his wound on 08/04/2015 they did a culture of this wound that showed Pseudomonas and MSSA. He was placed on an antibiotic but doesn't remember which one it is. He has continued using Aquacel Ag and a foam based cover. I don't have any of his records from the wound care center in [place] although the patient states he did have an MRI that did not show osteomyelitis. He is not aware of having a bone culture done and doesn't think that he had exposed bone on this and any point. The patient does in and out cath's and has no urinary incontinence issues. He is on appropriate bowel regimen which she administers suppositories. He is able to position himself appropriately and is wheelchair to try and avoid pressure over this wound 08/24/15; small horizontal wound over his lower sacral  area. Requires debridement of surface slough and nonviable subcutaneous tissue. There is a small amount of undermining noted no evidence of infection. On the right side of this wound there is some senescent rolled edges which may need debridement I did not do this today. 09/01/15 this is a small wound over the lower sacral area. It has tunneling superiorly. This goes roughly 1 cm. Nonviable subcutaneous tissue at the inferior aspect of the wound base. No real evidence of infection 09/07/15; small horizontal wound over the lower sacral area. Last week it had 1 cm of superior tunneling which appears to of resolved. No evidence of infection 09/21/15; continues to have a small horizontal wound over the lower sacral area. We have been using Prisma and this has contracted. He tells me that his mother is aware medication not able to change the dressing although he states that he is able to do this 09/28/15; a small horizontal wound over his lower sacral area is all that is left of this wound which was probably much larger and deeper at one point. Depth today at 0.7 cm. Base of this had some nonviable subcutaneous tissue that I removed. Appears to be stalling on current dressing which is Prisma 10/19/15; I have not seen this patient in several weeks. He arrives today with a linear/horizontal wound [name], [name] ([id number]) across his lower sacral area. Considerable depth of this wound. Extensive debridement done 11/17/15; patient arrives for two-week visit. He is not doing well wound is bigger with odor and drainage. Has been using Aquacel Ag. I have not seen this in over a month although I think he was seen by the physician covering me 2 weeks ago on 11/05/15 11/24/15; I brought this man back this week to review the wound after last week's deterioration. Culture grew Enterobacter and some staph aureus [MSSA]. I had ordered him Cipro but we don't seem to of been able to communicate with the patient therefore  we've called in today. He is not been systemically unwell. Still using Aquacel 12/15/15; very little change in this wound since the last time I saw it. Horizontal wound over the lower sacral area probably about 2 cm in width and 0.8 cm in depth. There is no evidence of infection. There appears to be callus around  this. In spite of the patient's best efforts I think there is an adequate pressure relief here. No current evidence of infection 12/22/15; horizontal pressure ulcer over the lower sacral area. Appears to be filling in and has better dimensions in appearance today. Still circumferential callus which would suggest inadequate offloading although everything else appears to be improved 12/29/15 horizontal pressure wound over the lower sacral area. Appears to be filling in however skin groin down into the divot. No evidence of infection 01/05/16; comes in today with not much change in this wound. There is surrounding callus indicative of pressure. Not much change in the wound bed. This clearly require debridement today we have been using Hydrofera Blue Objective Constitutional Sitting or standing Blood Pressure is within target range for patient.. Pulse regular and within target range for patient.Marland Kitchen Respirations regular, non-labored and within target range.. Temperature is normal and within the target range for the patient.. Patient appears well. Vitals Time Taken: 2:52 PM, Height: 67 in, Weight: 147 lbs, BMI: 23, Temperature: 98.3 F, Pulse: 99 bpm, Respiratory Rate: 18 breaths/min, Blood Pressure: 124/81 mmHg. General Notes: Wound exam; the patient has a linear horizontal pressure wound over his coccyx. This is never appeared infected. A callus around the wound and nonviable subcutaneous tissue all removed with a curette. Post debridement the rest of this really looks quite stable. The base of the wound does not appear to be infected. Think the issue here is unrelieved pressure Integumentary  (Hair, Skin) Wound #1 status is Open. Original cause of wound was Pressure Injury. The wound is located on the Left Coccyx. The wound measures 0.3cm length x 1.1cm width x 0.5cm depth; 0.259cm^2 area and 0.13cm^3 volume. There is fat exposed. There is no tunneling or undermining noted. There is a medium amount of serous drainage noted. The wound margin is thickened. There is medium (34-66%) pink granulation within the wound bed. There is a medium (34-66%) amount of necrotic tissue within the wound bed including Adherent Slough. The periwound skin appearance exhibited: Maceration, Moist. The periwound skin [name], [name] ([id number]) appearance did not exhibit: Dry/Scaly. Periwound temperature was noted as No Abnormality. Assessment Active Problems ICD-10 L89.153 - Pressure ulcer of sacral region, stage 3 G82.22 - Paraplegia, incomplete Procedures Wound #1 Wound #1 is a Pressure Ulcer located on the Left Coccyx . There was a Skin/Subcutaneous Tissue Debridement ([id number]) debridement with total area of 3.3 sq cm performed by Matthew Caul, MD. with the following instrument(s): Curette to remove Viable and Non-Viable tissue/material including Exudate, Fibrin/Slough, Eschar, Callus, and Subcutaneous after achieving pain control using Lidocaine 4% Topical Solution. A time out was conducted at 15:30, prior to the start of the procedure. A Minimum amount of bleeding was controlled with Pressure. The procedure was tolerated well with a pain level of 0 throughout and a pain level of 0 following the procedure. Post Debridement Measurements: 0.3cm length x 1.1cm width x 0.5cm depth; 0.13cm^3 volume. Post debridement Stage noted as Category/Stage III. Character of Wound/Ulcer Post Debridement is improved. Severity of Tissue Post Debridement is: Fat layer exposed. Post procedure Diagnosis Wound #1: Same as Pre-Procedure Plan Wound Cleansing: Wound #1 Left Coccyx: Clean wound with Normal  Saline. Anesthetic: Wound #1 Left Coccyx: Topical Lidocaine 4% cream applied to wound bed prior to debridement Skin Barriers/Peri-Wound Care: Wound #1 Left Coccyx: Skin Prep [name], [name] ([id number]) Primary Wound Dressing: Hydrafera Blue - pack into wound Secondary Dressing: Wound #1 Left Coccyx: Foam Tegaderm Dressing Change Frequency: Wound #1 Left Coccyx: Change  dressing every other day. Follow-up Appointments: Wound #1 Left Coccyx: Return Appointment in 1 week. Off-Loading: Wound #1 Left Coccyx: Turn and reposition every 2 hours Additional Orders / Instructions: Wound #1 Left Coccyx: Increase protein intake. Medications-please add to medication list.: Wound #1 Left Coccyx: Other: - Vitamin C, A and Zinc #1 at this point I think we should continue the [facility]. #2 consider advanced treatment option we'll run graphics through his insurance. If he is not approved for this I'll consider Iodoflex for a period of time Electronic Signature(s) Signed: 01/06/2016 7:42:49 AM By: Baltazar Najjar MD Entered By: Baltazar Najjar on 01/05/2016 18:36:49 [name] ([id number]) -------------------------------------------------------------------------------- SuperBill Details Patient Name: [name] Date of Service: 01/05/2016 Medical Record Patient Account Number: [id number] Number: Treating RN: [name] [birth year] (45 y.o. Other Clinician: Date of Birth/Sex: Male) Treating [name] Primary Care Physician: PATIENT, NO Physician/Extender: G Referring Physician: Weeks in Treatment: 20 Diagnosis Coding ICD-10 Codes Code Description L89.153 Pressure ulcer of sacral region, stage 3 G82.22 Paraplegia, incomplete Facility Procedures CPT4 Code: [id number] Description: 11042 - DEB SUBQ TISSUE 20 SQ CM/< ICD-10 Description Diagnosis L89.153 Pressure ulcer of sacral region, stage 3 Modifier: Quantity: 1 Physician Procedures CPT4 Code:  [id number] Description: 11042 - WC PHYS SUBQ TISS 20 SQ CM ICD-10 Description Diagnosis L89.153 Pressure ulcer of sacral region, stage 3 Modifier: Quantity: 1 Electronic Signature(s) Signed: 01/06/2016 7:42:49 AM By: Baltazar Najjar MD Entered By: Baltazar Najjar on 01/05/2016 18:37:11

## 2016-01-07 ENCOUNTER — Encounter: Payer: Medicare Other | Admitting: Physical Medicine & Rehabilitation

## 2016-01-12 ENCOUNTER — Encounter: Payer: Self-pay | Admitting: Physical Medicine & Rehabilitation

## 2016-01-12 ENCOUNTER — Encounter: Payer: Medicare Other | Attending: Physical Medicine & Rehabilitation | Admitting: Physical Medicine & Rehabilitation

## 2016-01-12 ENCOUNTER — Ambulatory Visit: Payer: Self-pay | Admitting: Internal Medicine

## 2016-01-12 VITALS — BP 120/80 | HR 88 | Resp 14

## 2016-01-12 DIAGNOSIS — R293 Abnormal posture: Secondary | ICD-10-CM | POA: Diagnosis not present

## 2016-01-12 DIAGNOSIS — R531 Weakness: Secondary | ICD-10-CM | POA: Diagnosis present

## 2016-01-12 DIAGNOSIS — G8220 Paraplegia, unspecified: Secondary | ICD-10-CM | POA: Insufficient documentation

## 2016-01-12 DIAGNOSIS — M62838 Other muscle spasm: Secondary | ICD-10-CM

## 2016-01-12 DIAGNOSIS — R252 Cramp and spasm: Secondary | ICD-10-CM | POA: Insufficient documentation

## 2016-01-12 DIAGNOSIS — M6249 Contracture of muscle, multiple sites: Secondary | ICD-10-CM | POA: Diagnosis not present

## 2016-01-12 DIAGNOSIS — S31000A Unspecified open wound of lower back and pelvis without penetration into retroperitoneum, initial encounter: Secondary | ICD-10-CM | POA: Diagnosis not present

## 2016-01-12 DIAGNOSIS — G479 Sleep disorder, unspecified: Secondary | ICD-10-CM | POA: Diagnosis not present

## 2016-01-12 DIAGNOSIS — X58XXXA Exposure to other specified factors, initial encounter: Secondary | ICD-10-CM | POA: Insufficient documentation

## 2016-01-12 DIAGNOSIS — K592 Neurogenic bowel, not elsewhere classified: Secondary | ICD-10-CM | POA: Insufficient documentation

## 2016-01-12 DIAGNOSIS — Z5189 Encounter for other specified aftercare: Secondary | ICD-10-CM | POA: Diagnosis not present

## 2016-01-12 DIAGNOSIS — G8222 Paraplegia, incomplete: Secondary | ICD-10-CM | POA: Diagnosis not present

## 2016-01-12 DIAGNOSIS — N319 Neuromuscular dysfunction of bladder, unspecified: Secondary | ICD-10-CM | POA: Insufficient documentation

## 2016-01-12 DIAGNOSIS — IMO0001 Reserved for inherently not codable concepts without codable children: Secondary | ICD-10-CM

## 2016-01-12 DIAGNOSIS — R143 Flatulence: Secondary | ICD-10-CM

## 2016-01-12 MED ORDER — SIMETHICONE 125 MG PO CAPS: 125.0000 mg | ORAL_CAPSULE | Freq: Three times a day (TID) | ORAL | 1 refills | Status: DC

## 2016-01-12 NOTE — Progress Notes (Signed)
Subjective:    Patient ID: [name], male    DOB: 03/29/1971, 45 y.o.   MRN: [id number]  HPI  45 y/o male with pmh of T8 SCI 2007 and psh of baclofen pump removal in 04/2013 presents for follow up of for management of his SCI.  His main issues are his postures and asymmetrical weakness and spasms.  Pt had his ITB pump removed due to inconvenience and stressors of withdrawal due to pump failure. He was living [place] and recently relocated to [place].  He is looking to establish care as well.  He has never had AD.  He I/O cath 4-6/day with condom cath at night.  He saw urologist last week and does not report issues.  He does AM bowel program.  No movement or sensation below T8.  He has a new wheelchair. He is independent, lives with his mother.  For spasms, he has tried oral baclofen 20mg  TID, but made him lethargic.  He took valium, which he usually took at night.    Last clinic visit 12/09/15.  Pt has still not seen PT.  Pt has an appointment to see Psychology.  He has an appointment to see Urology.  Baclofen 10 TID works for him.  Not taking valium anymore.  He continues to go to wound care and it appears to be improving.  He continues to have gas and the simethicone has not helped.    Pain Inventory Average Pain 7 Pain Right Now 7 My pain is other  In the last 24 hours, has pain interfered with the following? General activity 7 Relation with others no selection Enjoyment of life no selection What TIME of day is your pain at its worst? all Sleep (in general) Poor  Pain is worse with: walking, bending, sitting, inactivity, standing, unsure and some activites Pain improves with: nothing Relief from Meds: 1  Mobility do you drive?  yes use a wheelchair transfers alone Do you have any goals in this area?  no  Function disabled: date disabled .  Neuro/Psych bladder control problems weakness tremor spasms depression anxiety  Prior Studies Any changes since last visit?   no  Physicians involved in your care Any changes since last visit?  no   History reviewed. No pertinent family history. Social History   Social History  . Marital status: Divorced    Spouse name: N/A  . Number of children: N/A  . Years of education: N/A   Social History Main Topics  . Smoking status: Current Every Day Smoker    Packs/day: 0.50    Types: Cigarettes  . Smokeless tobacco: Current User  . Alcohol use 0.0 oz/week  . Drug use: Unknown  . Sexual activity: Not Asked   Other Topics Concern  . None   Social History Narrative  . None   Past Surgical History:  Procedure Laterality Date  . PROGRAMABLE BACLOFEN PUMP REVISION Left 2014   Removal    Past Medical History:  Diagnosis Date  . Thoracic spinal cord injury (HCC)    BP 120/80 (BP Location: Right Arm, Patient Position: Sitting, Cuff Size: Large)   Pulse 88   Resp 14   SpO2 97%   Opioid Risk Score:   Fall Risk Score:  `1  Depression screen PHQ 2/9  Depression screen PHQ 2/9 10/28/2015  Decreased Interest 1  Down, Depressed, Hopeless 1  PHQ - 2 Score 2  Altered sleeping 2  Tired, decreased energy 1  Change in appetite 0  Feeling bad or failure about yourself  1  Trouble concentrating 1  Moving slowly or fidgety/restless 1  Suicidal thoughts 0  PHQ-9 Score 8  Difficult doing work/chores Somewhat difficult    Review of Systems  Constitutional: Positive for diaphoresis.       Bladder control problems   HENT: Negative.   Eyes: Negative.   Respiratory: Negative.   Gastrointestinal: Positive for abdominal pain.  Endocrine: Negative.   Genitourinary: Negative.   Allergic/Immunologic: Negative.   Neurological: Positive for tremors and weakness.       Tingling Spasms   Psychiatric/Behavioral: Positive for dysphoric mood. The patient is nervous/anxious.   All other systems reviewed and are negative.     Objective:   Physical Exam Gen: NAD. Vital signs reviewed HENT: Normocephalic,  Atraumatic Eyes: EOMI, Conj WNL Cardio: S1, S2 normal, RRR Pulm: B/l clear to auscultation.  Effort normal Abd: Soft, non-distended, non-tender, BS+ MSK:  Gait nonambulatory.   No TTP.    No edema. Neuro: CN II-XII grossly intact.    Sensation absent below T8 dermatomes  Reflexes 1+ B/l LE  Strength  5/5 in all UE myotomes    0/5 in all LE myotomes  Significant spasms in B/l LE, making it difficult to assess spasticity, most pronounced in adductors Skin: Warm and Dry, ulcer left sacral area (improving)    Assessment & Plan:  45 y/o male with pmh of T8 SCI 2007 and psh of baclofen pump removal in 04/2013 presents for follow up of for management of his SCI.  1. T8 Paraplegia  Encouraged pt to follow up with PT for revaluation, postural exercises (again, 3rd time recommended)  Cont ROM, stretching  Follow up with Psychology for counseling   UDS reviewed and discussed with pt, +THC previously   Will follow for pt to be involved as a Dance movement psychotherapistmentor at hospital - discussed with pt and he is willing and eager to assist  2. Neurogenic Bladder  Follow up with Urology  Recommended pt follow up for cystoscopy as it has been >4 years  3. Neurogenic bowel  Cont bowel reg with suppository  4. Spasticity/Spasms   Had ITB pump, but did not want to keep due to convenience and stressors of withdrawals  Valium ineffective  Cont Baclofen 10 TID  Likely exacerbated by gas and ulcers  5. Abnormal posture  Encourage pt to follow up with PT for postural stabilization and ?seating evaluation    6. Left lower back open wound  Cont to follow with wound care with dressing changes  Appears to be improving  7. Sleep disturbance  Mainly because of gas at present  8. Gas  Simethicone dose increased

## 2016-01-14 ENCOUNTER — Encounter: Payer: Worker's Compensation | Admitting: Surgery

## 2016-01-14 DIAGNOSIS — L89153 Pressure ulcer of sacral region, stage 3: Secondary | ICD-10-CM | POA: Diagnosis not present

## 2016-01-14 DIAGNOSIS — G8222 Paraplegia, incomplete: Secondary | ICD-10-CM | POA: Diagnosis not present

## 2016-01-20 ENCOUNTER — Ambulatory Visit: Payer: Self-pay | Admitting: Surgery

## 2016-01-20 ENCOUNTER — Ambulatory Visit: Payer: Self-pay | Admitting: Physical Medicine & Rehabilitation

## 2016-01-21 NOTE — Progress Notes (Signed)
KODIAK, ROLLYSON ([id number]) Visit Report for 01/14/2016 Arrival Information Details Patient Name: [name], [name] Date of Service: 01/14/2016 3:45 PM Medical Record Number: [id number] Patient Account Number: [id number] Date of Birth/Sex: Oct 14, 1970 (45 y.o. Male) Treating RN: Phillis Haggis Primary Care Physician: PATIENT, NO Other Clinician: Referring Physician: Treating Physician/Extender: Rudene Re in Treatment: 21 Visit Information History Since Last Visit All ordered tests and consults were completed: No Patient Arrived: Wheel Chair Added or deleted any medications: No Arrival Time: 16:03 Any new allergies or adverse reactions: No Accompanied By: self Had a fall or experienced change in No Transfer Assistance: EasyPivot activities of daily living that may affect Patient Lift risk of falls: Patient Identification Verified: Yes Signs or symptoms of abuse/neglect since last No Secondary Verification Process Yes visito Completed: Hospitalized since last visit: No Patient Requires Transmission- No Pain Present Now: No Based Precautions: Patient Has Alerts: No Electronic Signature(s) Signed: 01/19/2016 6:03:12 PM By: Alejandro Mulling Previous Signature: 01/14/2016 5:24:41 PM Version By: Alejandro Mulling Entered By: Alejandro Mulling on 01/19/2016 17:57:03 [name] ([id number]) -------------------------------------------------------------------------------- Clinic Level of Care Assessment Details Patient Name: [name] Date of Service: 01/14/2016 3:45 PM Medical Record Number: [id number] Patient Account Number: [id number] Date of Birth/Sex: December 15, 1970 (45 y.o. Male) Treating RN: Phillis Haggis Primary Care Physician: PATIENT, NO Other Clinician: Referring Physician: Treating Physician/Extender: Rudene Re in Treatment: 21 Clinic Level of Care Assessment Items TOOL 1 Quantity Score []  - Use when EandM and Procedure is performed on INITIAL  visit 0 ASSESSMENTS - Nursing Assessment / Reassessment []  - General Physical Exam (combine w/ comprehensive assessment (listed just 0 below) when performed on new pt. evals) []  - Comprehensive Assessment (HX, ROS, Risk Assessments, Wounds Hx, etc.) 0 ASSESSMENTS - Wound and Skin Assessment / Reassessment []  - Dermatologic / Skin Assessment (not related to wound area) 0 ASSESSMENTS - Ostomy and/or Continence Assessment and Care []  - Incontinence Assessment and Management 0 []  - Ostomy Care Assessment and Management (repouching, etc.) 0 PROCESS - Coordination of Care []  - Simple Patient / Family Education for ongoing care 0 []  - Complex (extensive) Patient / Family Education for ongoing care 0 []  - Staff obtains Chiropractor, Records, Test Results / Process Orders 0 []  - Staff telephones HHA, Nursing Homes / Clarify orders / etc 0 []  - Routine Transfer to another Facility (non-emergent condition) 0 []  - Routine Hospital Admission (non-emergent condition) 0 []  - New Admissions / Manufacturing engineer / Ordering NPWT, Apligraf, etc. 0 []  - Emergency Hospital Admission (emergent condition) 0 PROCESS - Special Needs []  - Pediatric / Minor Patient Management 0 []  - Isolation Patient Management 0 AZRAEL, MADDIX ([id number]) []  - Hearing / Language / Visual special needs 0 []  - Assessment of Community assistance (transportation, D/C planning, etc.) 0 []  - Additional assistance / Altered mentation 0 []  - Support Surface(s) Assessment (bed, cushion, seat, etc.) 0 INTERVENTIONS - Miscellaneous []  - External ear exam 0 []  - Patient Transfer (multiple staff / Nurse, adult / Similar devices) 0 []  - Simple Staple / Suture removal (25 or less) 0 []  - Complex Staple / Suture removal (26 or more) 0 []  - Hypo/Hyperglycemic Management (do not check if billed separately) 0 []  - Ankle / Brachial Index (ABI) - do not check if billed separately 0 Has the patient been seen at the hospital within the last  three years: Yes Total Score: 0 Level Of Care: ____ Electronic Signature(s) Signed: 01/19/2016 6:03:12 PM By: Alejandro Mulling Entered By: Alejandro Mulling on 01/19/2016 17:59:02  Matthew EvangelistHARRIS, Deontray ([id number]) -------------------------------------------------------------------------------- Encounter Discharge Information Details Patient Name: Matthew EvangelistHARRIS, [name] Date of Service: 01/14/2016 3:45 PM Medical Record Number: [id number] Patient Account Number: [id number] Date of Birth/Sex: [birth year](45 y.o. Male) Treating RN: Phillis HaggisPinkerton, Debi Primary Care Physician: PATIENT, NO Other Clinician: Referring Physician: Treating Physician/Extender: Rudene ReBritto, Errol Weeks in Treatment: 21 Encounter Discharge Information Items Discharge Pain Level: 0 Discharge Condition: Stable Ambulatory Status: Wheelchair Discharge Destination: Home Private Transportation: Auto Accompanied By: self Schedule Follow-up Appointment: Yes Medication Reconciliation completed and Yes provided to Patient/Care Yvonnie Schinke: Clinical Summary of Care: Electronic Signature(s) Signed: 01/19/2016 6:03:12 PM By: Alejandro MullingPinkerton, Debra Previous Signature: 01/14/2016 5:24:41 PM Version By: Alejandro MullingPinkerton, Debra Entered By: Alejandro MullingPinkerton, Debra on 01/19/2016 18:00:40 Matthew EvangelistHARRIS, Tashi ([id number]) -------------------------------------------------------------------------------- Lower Extremity Assessment Details Patient Name: Matthew EvangelistHARRIS, [name] Date of Service: 01/14/2016 3:45 PM Medical Record Number: [id number] Patient Account Number: [id number] Date of Birth/Sex: [birth year] (45 y.o. Male) Treating RN: Phillis HaggisPinkerton, Debi Primary Care Physician: PATIENT, NO Other Clinician: Referring Physician: Treating Physician/Extender: Rudene ReBritto, Errol Weeks in Treatment: 21 Electronic Signature(s) Signed: 01/19/2016 6:03:12 PM By: Alejandro MullingPinkerton, Debra Previous Signature: 01/14/2016 5:24:41 PM Version By: Alejandro MullingPinkerton, Debra Entered By: Alejandro MullingPinkerton, Debra on 01/19/2016  17:57:28 Matthew EvangelistHARRIS, Deral ([id number]) -------------------------------------------------------------------------------- Multi Wound Chart Details Patient Name: Matthew EvangelistHARRIS, Mathan Date of Service: 01/14/2016 3:45 PM Medical Record Number: [id number] Patient Account Number: [id number] Date of Birth/Sex: [birth year] 37(45 y.o. Male) Treating RN: Phillis HaggisPinkerton, Debi Primary Care Physician: PATIENT, NO Other Clinician: Referring Physician: Treating Physician/Extender: Rudene ReBritto, Errol Weeks in Treatment: 21 Vital Signs Height(in): 67 Pulse(bpm): 88 Weight(lbs): 147 Blood Pressure 103/68 (mmHg): Body Mass Index(BMI): 23 Temperature(F): 97.7 Respiratory Rate 16 (breaths/min): Photos: [N/A:N/A] Wound Location: Left Coccyx N/A N/A Wounding Event: Pressure Injury N/A N/A Primary Etiology: Pressure Ulcer N/A N/A Comorbid History: Paraplegia N/A N/A Date Acquired: 12/24/2014 N/A N/A Weeks of Treatment: 21 N/A N/A Wound Status: Open N/A N/A Measurements L x W x D 0.3x1.1x0.4 N/A N/A (cm) Area (cm) : 0.259 N/A N/A Volume (cm) : 0.104 N/A N/A % Reduction in Area: 75.60% N/A N/A % Reduction in Volume: 80.40% N/A N/A Classification: Category/Stage III N/A N/A Exudate Amount: Medium N/A N/A Matthew EvangelistHARRIS, Barret ([id number]) Exudate Type: Serous N/A N/A Exudate Color: amber N/A N/A Foul Odor After Yes N/A N/A Cleansing: Odor Anticipated Due to No N/A N/A Product Use: Wound Margin: Thickened N/A N/A Granulation Amount: Medium (34-66%) N/A N/A Granulation Quality: Pink N/A N/A Necrotic Amount: Medium (34-66%) N/A N/A Exposed Structures: Fat: Yes N/A N/A Fascia: No Tendon: No Muscle: No Joint: No Bone: No Epithelialization: None N/A N/A Debridement: Debridement ([id number](11042- N/A N/A 11047) Pre-procedure 16:24 N/A N/A Verification/Time Out Taken: Pain Control: Lidocaine 4% Topical N/A N/A Solution Tissue Debrided: Fibrin/Slough, Exudates, N/A N/A Subcutaneous Level: Skin/Subcutaneous N/A  N/A Tissue Debridement Area (sq 0.33 N/A N/A cm): Instrument: Curette N/A N/A Bleeding: Minimum N/A N/A Hemostasis Achieved: Pressure N/A N/A Procedural Pain: 0 N/A N/A Post Procedural Pain: 0 N/A N/A Debridement Treatment Procedure was tolerated N/A N/A Response: well Post Debridement 1.5x0.7x0.3 N/A N/A Measurements L x W x D (cm) Post Debridement 0.247 N/A N/A Volume: (cm) Post Debridement Category/Stage III N/A N/A Stage: Periwound Skin Texture: No Abnormalities Noted N/A N/A Periwound Skin Maceration: Yes N/A N/A Moisture: Moist: Yes Dry/Scaly: No Periwound Skin Color: No Abnormalities Noted N/A N/A Matthew EvangelistHARRIS, Raynell ([id number]) Temperature: No Abnormality N/A N/A Tenderness on No N/A N/A Palpation: Wound Preparation: Ulcer Cleansing: N/A N/A Rinsed/Irrigated with Saline Topical Anesthetic Applied: Other: lidocaine 4% Procedures Performed: Debridement N/A N/A Treatment Notes Wound #1 (Left Coccyx)  1. Cleansed with: Clean wound with Normal Saline 2. Anesthetic Topical Lidocaine 4% cream to wound bed prior to debridement 3. Peri-wound Care: Skin Prep 4. Dressing Applied: Aquacel Ag 5. Secondary Dressing Applied Foam Tegaderm Electronic Signature(s) Signed: 01/19/2016 6:03:12 PM By: Alejandro Mulling Previous Signature: 01/14/2016 5:24:41 PM Version By: Alejandro Mulling Entered By: Alejandro Mulling on 01/19/2016 17:57:58 [name] ([id number]) -------------------------------------------------------------------------------- Multi-Disciplinary Care Plan Details Patient Name: [name] Date of Service: 01/14/2016 3:45 PM Medical Record Number: [id number] Patient Account Number: [id number] Date of Birth/Sex: Aug 03, 1970 (45 y.o. Male) Treating RN: Phillis Haggis Primary Care Physician: PATIENT, NO Other Clinician: Referring Physician: Treating Physician/Extender: Rudene Re in Treatment: 21 Active Inactive Abuse / Safety / Falls / Self  Care Management Nursing Diagnoses: Potential for falls Goals: Patient will remain injury free Date Initiated: 08/17/2015 Goal Status: Active Interventions: Assess fall risk on admission and as needed Notes: Nutrition Nursing Diagnoses: Imbalanced nutrition Goals: Patient/caregiver agrees to and verbalizes understanding of need to use nutritional supplements and/or vitamins as prescribed Date Initiated: 08/17/2015 Goal Status: Active Interventions: Assess patient nutrition upon admission and as needed per policy Notes: Orientation to the Wound Care Program Nursing Diagnoses: Knowledge deficit related to the wound healing center program Goals: Patient/caregiver will verbalize understanding of the Wound Healing Center Program BRAXTIN, BAMBA ([id number]) Date Initiated: 08/17/2015 Goal Status: Active Interventions: Provide education on orientation to the wound center Notes: Pressure Nursing Diagnoses: Knowledge deficit related to causes and risk factors for pressure ulcer development Goals: Patient will remain free from development of additional pressure ulcers Date Initiated: 08/17/2015 Goal Status: Active Interventions: Assess: immobility, friction, shearing, incontinence upon admission and as needed Notes: Wound/Skin Impairment Nursing Diagnoses: Impaired tissue integrity Goals: Ulcer/skin breakdown will have a volume reduction of 30% by week 4 Date Initiated: 08/17/2015 Goal Status: Active Ulcer/skin breakdown will have a volume reduction of 50% by week 8 Date Initiated: 08/17/2015 Goal Status: Active Ulcer/skin breakdown will have a volume reduction of 80% by week 12 Date Initiated: 08/17/2015 Goal Status: Active Interventions: Assess patient/caregiver ability to obtain necessary supplies Assess ulceration(s) every visit Notes: Electronic Signature(s) Signed: 01/19/2016 6:03:12 PM By: Servando Snare, Leonette Most ([id number]) Previous Signature: 01/14/2016  5:24:41 PM Version By: Alejandro Mulling Entered By: Alejandro Mulling on 01/19/2016 17:57:41 [name] ([id number]) -------------------------------------------------------------------------------- Pain Assessment Details Patient Name: [name] Date of Service: 01/14/2016 3:45 PM Medical Record Number: [id number] Patient Account Number: [id number] Date of Birth/Sex: 27-Sep-1970 (45 y.o. Male) Treating RN: Phillis Haggis Primary Care Physician: PATIENT, NO Other Clinician: Referring Physician: Treating Physician/Extender: Rudene Re in Treatment: 21 Active Problems Location of Pain Severity and Description of Pain Patient Has Paino No Site Locations With Dressing Change: No Pain Management and Medication Current Pain Management: Electronic Signature(s) Signed: 01/19/2016 6:03:12 PM By: Alejandro Mulling Previous Signature: 01/14/2016 5:24:41 PM Version By: Alejandro Mulling Entered By: Alejandro Mulling on 01/19/2016 17:57:15 [name] ([id number]) -------------------------------------------------------------------------------- Patient/Caregiver Education Details Patient Name: [name] Date of Service: 01/14/2016 3:45 PM Medical Record Number: [id number] Patient Account Number: [id number] Date of Birth/Gender: 01/13/1971 (45 y.o. Male) Treating RN: Phillis Haggis Primary Care Physician: PATIENT, NO Other Clinician: Referring Physician: Treating Physician/Extender: Rudene Re in Treatment: 21 Education Assessment Education Provided To: Patient Education Topics Provided Wound/Skin Impairment: Handouts: Other: change dressing as ordered Methods: Demonstration, Explain/Verbal Responses: State content correctly Electronic Signature(s) Signed: 01/19/2016 6:03:12 PM By: Alejandro Mulling Previous Signature: 01/14/2016 5:24:41 PM Version By: Alejandro Mulling Entered By: Alejandro Mulling on 01/19/2016 18:00:47 [name]  ([id number]) --------------------------------------------------------------------------------  Wound Assessment Details Patient Name: SUSANA, GRIPP Date of Service: 01/14/2016 3:45 PM Medical Record Number: [id number] Patient Account Number: [id number] Date of Birth/Sex: [birth year] (45 y.o. Male) Treating RN: Phillis Haggis Primary Care Physician: PATIENT, NO Other Clinician: Referring Physician: Treating Physician/Extender: Maxwell Caul Weeks in Treatment: 21 Wound Status Wound Number: 1 Primary Etiology: Pressure Ulcer Wound Location: Left Coccyx Wound Status: Open Wounding Event: Pressure Injury Comorbid History: Paraplegia Date Acquired: 12/24/2014 Weeks Of Treatment: 21 Clustered Wound: No Photos Photo Uploaded By: Alejandro Mulling on 01/14/2016 17:22:53 Wound Measurements Length: (cm) 0.3 Width: (cm) 1.1 Depth: (cm) 0.4 Area: (cm) 0.259 Volume: (cm) 0.104 % Reduction in Area: 75.6% % Reduction in Volume: 80.4% Epithelialization: None Tunneling: No Undermining: No Wound Description Classification: Category/Stage III Wound Margin: Thickened Exudate Amount: Medium Exudate Type: Serous Exudate Color: amber Foul Odor After Cleansing: Yes Due to Product Use: No Wound Bed Granulation Amount: Medium (34-66%) Exposed Structure Granulation Quality: Pink Fascia Exposed: No Necrotic Amount: Medium (34-66%) Fat Layer Exposed: Yes Necrotic Quality: Adherent Slough Tendon Exposed: No DAWAN, FARNEY ([id number]) Muscle Exposed: No Joint Exposed: No Bone Exposed: No Periwound Skin Texture Texture Color No Abnormalities Noted: No No Abnormalities Noted: No Moisture Temperature / Pain No Abnormalities Noted: No Temperature: No Abnormality Dry / Scaly: No Maceration: Yes Moist: Yes Wound Preparation Ulcer Cleansing: Rinsed/Irrigated with Saline Topical Anesthetic Applied: Other: lidocaine 4%, Treatment Notes Wound #1 (Left Coccyx) 1. Cleansed  with: Clean wound with Normal Saline 2. Anesthetic Topical Lidocaine 4% cream to wound bed prior to debridement 3. Peri-wound Care: Skin Prep 4. Dressing Applied: Aquacel Ag 5. Secondary Dressing Applied Foam Tegaderm Electronic Signature(s) Signed: 01/14/2016 5:24:41 PM By: Alejandro Mulling Entered By: Alejandro Mulling on 01/14/2016 16:14:19 ZAHARI, XIANG ([id number]) -------------------------------------------------------------------------------- Vitals Details Patient Name: [name] Date of Service: 01/14/2016 3:45 PM Medical Record Number: [id number] Patient Account Number: [id number] Date of Birth/Sex: July 11, 1970 (45 y.o. Male) Treating RN: Phillis Haggis Primary Care Physician: PATIENT, NO Other Clinician: Referring Physician: Treating Physician/Extender: Rudene Re in Treatment: 21 Vital Signs Time Taken: 16:04 Temperature (F): 97.7 Height (in): 67 Pulse (bpm): 88 Weight (lbs): 147 Respiratory Rate (breaths/min): 16 Body Mass Index (BMI): 23 Blood Pressure (mmHg): 103/68 Reference Range: 80 - 120 mg / dl Electronic Signature(s) Signed: 01/19/2016 6:03:12 PM By: Alejandro Mulling Previous Signature: 01/14/2016 5:24:41 PM Version By: Alejandro Mulling Entered By: Alejandro Mulling on 01/19/2016 17:57:21

## 2016-01-24 DIAGNOSIS — A630 Anogenital (venereal) warts: Secondary | ICD-10-CM | POA: Diagnosis not present

## 2016-01-24 DIAGNOSIS — N200 Calculus of kidney: Secondary | ICD-10-CM | POA: Diagnosis not present

## 2016-01-24 DIAGNOSIS — N312 Flaccid neuropathic bladder, not elsewhere classified: Secondary | ICD-10-CM | POA: Diagnosis not present

## 2016-01-25 ENCOUNTER — Ambulatory Visit: Payer: Self-pay | Admitting: Psychiatry

## 2016-01-27 ENCOUNTER — Encounter: Payer: Medicare Other | Attending: General Surgery | Admitting: General Surgery

## 2016-01-27 ENCOUNTER — Encounter: Payer: Self-pay | Admitting: General Surgery

## 2016-01-27 DIAGNOSIS — L89153 Pressure ulcer of sacral region, stage 3: Secondary | ICD-10-CM

## 2016-01-27 DIAGNOSIS — G8222 Paraplegia, incomplete: Secondary | ICD-10-CM | POA: Insufficient documentation

## 2016-01-27 DIAGNOSIS — Z8249 Family history of ischemic heart disease and other diseases of the circulatory system: Secondary | ICD-10-CM | POA: Diagnosis not present

## 2016-01-27 DIAGNOSIS — F17210 Nicotine dependence, cigarettes, uncomplicated: Secondary | ICD-10-CM | POA: Diagnosis not present

## 2016-01-27 NOTE — Progress Notes (Signed)
Paraplegic with pressure ulcer sacral area  Treat with prisma

## 2016-01-28 NOTE — Progress Notes (Signed)
TIEN, SPOONER ([id number]) Visit Report for 01/27/2016 Chief Complaint Document Details Patient Name: [name], [name] Date of Service: 01/27/2016 12:45 PM Medical Record Number: [id number] Patient Account Number: [id number] Date of Birth/Sex: [birth year] (45 y.o. Male) Treating RN: Phillis Haggis Primary Care Physician: PATIENT, NO Other Clinician: Referring Physician: Treating Physician/Extender: Elayne Snare in Treatment: 23 Information Obtained from: Patient Chief Complaint 08/17/15; the patient is recently really relocated from [facility]. He is here to establish a wound care for a stage III ulcer on his lower sacral area. Electronic Signature(s) Signed: 01/27/2016 1:53:47 PM By: Ardath Sax MD Entered By: Ardath Sax on 01/27/2016 13:53:46 WINFERD, WEASE ([id number]) -------------------------------------------------------------------------------- HPI Details Patient Name: [name] Date of Service: 01/27/2016 12:45 PM Medical Record Number: [id number] Patient Account Number: [id number] Date of Birth/Sex: Sep 01, 1970 (45 y.o. 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 He was diagnosed with what sounds like a stage III pressure ulcer in September 2016 although this may have been present for some time longer than that perhaps July 2016. He was followed by wound care in [place]. By his description there were using Silvadene cream for a period of time and then 3 months ago change to Aquacel Ag which she is continued and changes daily with help from his mother. He has recently relocated to [facility] He went to urgent care to follow-up on  his wound on 08/04/2015 they did a culture of this wound that showed Pseudomonas and MSSA. He was placed on an antibiotic but doesn't remember which one it is. He has continued using Aquacel Ag and a foam based cover. I don't have any of his records from the wound care center in [place] although the patient states he did have an MRI that did not show osteomyelitis. He is not aware of having a bone culture done and doesn't think that he had exposed bone on this and any point. The patient does in and out cath's and has no urinary incontinence issues. He is on appropriate bowel regimen which she administers suppositories. He is able to position himself appropriately and is wheelchair to try and avoid pressure over this wound 08/24/15; small horizontal wound over his lower sacral area. Requires debridement of surface slough and nonviable subcutaneous tissue. There is a small amount of undermining noted no evidence of infection. On the right side of this wound there is some senescent rolled edges which may need debridement I did not do this today. 09/01/15 this is a small wound over the lower sacral area. It has tunneling superiorly. This goes roughly 1 cm. Nonviable subcutaneous tissue at the inferior aspect of the wound base. No real evidence of infection 09/07/15; small horizontal wound over the lower sacral area. Last week it had 1 cm of superior tunneling which appears to of resolved. No evidence of infection 09/21/15; continues to have a small horizontal wound over the lower sacral area. We have been using Prisma and this has contracted. He tells me that his mother is aware medication not able to change the dressing although he states that he is able to do this 09/28/15; a small horizontal wound over his lower sacral area is all that is left of this wound which was probably much larger and deeper at  one point. Depth today at 0.7 cm. Base of this had some nonviable subcutaneous tissue that I  removed. Appears to be stalling on current dressing which is Prisma 10/19/15; I have not seen this patient in several weeks. He arrives today with a linear/horizontal wound across his lower sacral area. Considerable depth of this wound. Extensive debridement done 11/17/15; patient arrives for two-week visit. He is not doing well wound is bigger with odor and drainage. Has been using Aquacel Ag. I have not seen this in over a month although I think he was seen by the physician covering me 2 weeks ago on 11/05/15 11/24/15; I brought this man back this week to review the wound after last week's deterioration. Culture grew Enterobacter and some staph aureus [MSSA]. I had ordered him Cipro but we don't seem to of been able to communicate with the patient therefore we've called in today. He is not been systemically unwell. Still using Aquacel 12/15/15; very little change in this wound since the last time I saw it. Horizontal wound over the lower sacral Saefong, Tagen ([id number]) area probably about 2 cm in width and 0.8 cm in depth. There is no evidence of infection. There appears to be callus around this. In spite of the patient's best efforts I think there is an adequate pressure relief here. No current evidence of infection 12/22/15; horizontal pressure ulcer over the lower sacral area. Appears to be filling in and has better dimensions in appearance today. Still circumferential callus which would suggest inadequate offloading although everything else appears to be improved 12/29/15 horizontal pressure wound over the lower sacral area. Appears to be filling in however skin groin down into the divot. No evidence of infection 01/05/16; comes in today with not much change in this wound. There is surrounding callus indicative of pressure. Not much change in the wound bed. This clearly require debridement today we have been using Hydrofera Blue Electronic Signature(s) Signed: 01/27/2016 1:54:01 PM By: Ardath Sax MD Entered By: Ardath Sax on 01/27/2016 13:54:01 MONTGOMERY, FAVOR ([id number]) -------------------------------------------------------------------------------- Physical Exam Details Patient Name: [name] Date of Service: 01/27/2016 12:45 PM Medical Record Number: [id number] Patient Account Number: [id number] Date of Birth/Sex: Mar 12, 1971 (45 y.o. Male) Treating RN: Phillis Haggis Primary Care Physician: PATIENT, NO Other Clinician: Referring Physician: Treating Physician/Extender: Elayne Snare in Treatment: 23 Electronic Signature(s) Signed: 01/27/2016 1:54:08 PM By: Ardath Sax MD Entered By: Ardath Sax on 01/27/2016 13:54:08 [name] ([id number]) -------------------------------------------------------------------------------- Physician Orders Details Patient Name: [name] Date of Service: 01/27/2016 12:45 PM Medical Record Number: [id number] Patient Account Number: [id number] Date of Birth/Sex: Oct 30, 1970 (45 y.o. Male) Treating RN: Huel Coventry Primary Care Physician: PATIENT, NO Other Clinician: Referring Physician: Treating Physician/Extender: Elayne Snare in Treatment: 53 Verbal / Phone Orders: Yes Clinician: Huel Coventry Read Back and Verified: Yes Diagnosis Coding Wound Cleansing Wound #1 Left Coccyx o Clean wound with Normal Saline. Anesthetic Wound #1 Left Coccyx o Topical Lidocaine 4% cream applied to wound bed prior to debridement Skin Barriers/Peri-Wound Care Wound #1 Left Coccyx o Skin Prep Primary Wound Dressing o Aquacel Ag Secondary Dressing Wound #1 Left Coccyx o Foam o Tegaderm Dressing Change Frequency Wound #1 Left Coccyx o Change dressing every day. Follow-up Appointments Wound #1 Left Coccyx o Return Appointment in 1 week. Off-Loading Wound #1 Left Coccyx o Turn and reposition every 2 hours Additional Orders / Instructions Wound #1 Left Coccyx o Increase protein  intake. RASAAN, BROTHERTON ([id number]) Medications-please add to medication list. Wound #1 Left  Coccyx o Other: - Vitamin C, A and Zinc Electronic Signature(s) Signed: 01/27/2016 3:59:06 PM By: Ardath Sax MD Signed: 01/27/2016 4:28:52 PM By: Elliot Gurney RN, BSN, Kim RN, BSN Entered By: Elliot Gurney, RN, BSN, Kim on 01/27/2016 13:13:54 ALFIE, RIDEAUX ([id number]) -------------------------------------------------------------------------------- Problem List Details Patient Name: [name] Date of Service: 01/27/2016 12:45 PM Medical Record Number: [id number] Patient Account Number: [id number] Date of Birth/Sex: 05/25/1970 (45 y.o. Male) Treating RN: Phillis Haggis Primary Care Physician: PATIENT, NO Other Clinician: Referring Physician: Treating Physician/Extender: Elayne Snare in Treatment: 23 Active Problems ICD-10 Encounter Code Description Active Date Diagnosis L89.153 Pressure ulcer of sacral region, stage 3 08/17/2015 Yes G82.22 Paraplegia, incomplete 08/17/2015 Yes Inactive Problems Resolved Problems Electronic Signature(s) Signed: 01/27/2016 1:53:36 PM By: Ardath Sax MD Entered By: Ardath Sax on 01/27/2016 13:53:36 [name] ([id number]) -------------------------------------------------------------------------------- Progress Note Details Patient Name: [name] Date of Service: 01/27/2016 12:45 PM Medical Record Number: [id number] Patient Account Number: [id number] Date of Birth/Sex: February 23, 1971 (45 y.o. Male) Treating RN: Phillis Haggis Primary Care Physician: PATIENT, NO Other Clinician: Referring Physician: Treating Physician/Extender: Elayne Snare in Treatment: 23 Subjective Chief Complaint Information obtained from Patient 08/17/15; the patient is recently really relocated from [facility]. He is here to establish a wound care for a stage III ulcer on his lower sacral area. 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 He was diagnosed with what sounds like a stage III pressure ulcer in September 2016 although this may have been present for some time longer than that perhaps July 2016. He was followed by wound care in [place]. By his description there were using Silvadene cream for a period of time and then 3 months ago change to Aquacel Ag which she is continued and changes daily with help from his mother. He has recently relocated to [facility]. He went to urgent care to follow- up on his wound on 08/04/2015 they did a culture of this wound that showed Pseudomonas and MSSA. He was placed on an antibiotic but doesn't remember which one it is. He has continued using Aquacel Ag and a foam based cover. I don't have any of his records from the wound care center in [place] although the patient states he did have an MRI that did not show osteomyelitis. He is not aware of having a bone culture done and doesn't think that he had exposed bone on this and any point. The patient does in and out cath's and has no urinary incontinence issues. He is on appropriate bowel regimen which she administers suppositories. He is able to position himself appropriately and is wheelchair to try and avoid pressure over this wound 08/24/15; small horizontal wound over his lower sacral area. Requires debridement of surface slough and nonviable subcutaneous tissue. There is a small amount of undermining noted no evidence of infection. On the right side of this wound there is some senescent rolled edges which may need debridement I did not do this today. 09/01/15 this is a small wound over the lower sacral area. It has tunneling superiorly. This goes roughly 1 cm. Nonviable subcutaneous tissue at the inferior aspect of the wound base. No real evidence of infection 09/07/15; small horizontal wound over the lower  sacral area. Last week it had 1 cm of superior tunneling which appears to of resolved. No evidence of infection 09/21/15; continues to have a small horizontal  wound over the lower sacral area. We have been using Prisma and this has contracted. He tells me that his mother is aware medication not able to change the dressing although he states that he is able to do this 09/28/15; a small horizontal wound over his lower sacral area is all that is left of this wound which was probably much larger and deeper at one point. Depth today at 0.7 cm. Base of this had some nonviable subcutaneous tissue that I removed. Appears to be stalling on current dressing which is Prisma 10/19/15; I have not seen this patient in several weeks. He arrives today with a linear/horizontal wound across his lower sacral area. Considerable depth of this wound. Extensive debridement done 11/17/15; patient arrives for two-week visit. He is not doing well wound is bigger with odor and drainage. Has Matthew EvangelistHARRIS, Tally ([id number]) been using Aquacel Ag. I have not seen this in over a month although I think he was seen by the physician covering me 2 weeks ago on 11/05/15 11/24/15; I brought this man back this week to review the wound after last week's deterioration. Culture grew Enterobacter and some staph aureus [MSSA]. I had ordered him Cipro but we don't seem to of been able to communicate with the patient therefore we've called in today. He is not been systemically unwell. Still using Aquacel 12/15/15; very little change in this wound since the last time I saw it. Horizontal wound over the lower sacral area probably about 2 cm in width and 0.8 cm in depth. There is no evidence of infection. There appears to be callus around this. In spite of the patient's best efforts I think there is an adequate pressure relief here. No current evidence of infection 12/22/15; horizontal pressure ulcer over the lower sacral area. Appears to be filling in  and has better dimensions in appearance today. Still circumferential callus which would suggest inadequate offloading although everything else appears to be improved 12/29/15 horizontal pressure wound over the lower sacral area. Appears to be filling in however skin groin down into the divot. No evidence of infection 01/05/16; comes in today with not much change in this wound. There is surrounding callus indicative of pressure. Not much change in the wound bed. This clearly require debridement today we have been using Hydrofera Blue Objective Constitutional Vitals Time Taken: 1:03 PM, Height: 67 in, Weight: 147 lbs, BMI: 23, Temperature: 98.1 F, Pulse: 64 bpm, Respiratory Rate: 16 breaths/min, Blood Pressure: 110/64 mmHg. Integumentary (Hair, Skin) Wound #1 status is Open. Original cause of wound was Pressure Injury. The wound is located on the Left Coccyx. The wound measures 0.9cm length x 1.5cm width x 0.5cm depth; 1.06cm^2 area and 0.53cm^3 volume. There is fat exposed. There is no tunneling or undermining noted. There is a large amount of serous drainage noted. The wound margin is thickened. There is medium (34-66%) pink granulation within the wound bed. There is a medium (34-66%) amount of necrotic tissue within the wound bed including Adherent Slough. The periwound skin appearance exhibited: Callus, Maceration, Moist. The periwound skin appearance did not exhibit: Dry/Scaly. Periwound temperature was noted as No Abnormality. Pressure ulcer smaller with Rx of silver alginate daily Assessment Active Problems Matthew EvangelistHARRIS, Joseff ([id number]) ICD-10 L89.153 - Pressure ulcer of sacral region, stage 3 G82.22 - Paraplegia, incomplete Plan Wound Cleansing: Wound #1 Left Coccyx: Clean wound with Normal Saline. Anesthetic: Wound #1 Left Coccyx: Topical Lidocaine 4% cream applied to wound bed prior to debridement Skin Barriers/Peri-Wound Care: Wound #1 Left  Coccyx: Skin Prep Primary Wound  Dressing: Aquacel Ag Secondary Dressing: Wound #1 Left Coccyx: Foam Tegaderm Dressing Change Frequency: Wound #1 Left Coccyx: Change dressing every day. Follow-up Appointments: Wound #1 Left Coccyx: Return Appointment in 1 week. Off-Loading: Wound #1 Left Coccyx: Turn and reposition every 2 hours Additional Orders / Instructions: Wound #1 Left Coccyx: Increase protein intake. Medications-please add to medication list.: Wound #1 Left Coccyx: Other: - Vitamin C, A and Zinc Follow-Up Appointments: A follow-up appointment should be scheduled. Medication Reconciliation completed and provided to Patient/Care Provider. A Patient Clinical Summary of Care was provided to [facility] [place] ([id number]) Electronic Signature(s) Signed: 01/27/2016 1:55:22 PM By: Ardath Sax MD Entered By: Ardath Sax on 01/27/2016 13:55:22 Goehner, WENZLICK ([id number]) -------------------------------------------------------------------------------- SuperBill Details Patient Name: [name] Date of Service: 01/27/2016 Medical Record Number: [id number] Patient Account Number: [id number] Date of Birth/Sex: 02/14/1971 (45 y.o. Male) Treating RN: Phillis Haggis Primary Care Physician: PATIENT, NO Other Clinician: Referring Physician: Treating Physician/Extender: Elayne Snare in Treatment: 23 Diagnosis Coding ICD-10 Codes Code Description L89.153 Pressure ulcer of sacral region, stage 3 G82.22 Paraplegia, incomplete Facility Procedures CPT4 Code: 08657846 Description: 96295 - WOUND CARE VISIT-LEV 2 EST PT Modifier: Quantity: 1 Physician Procedures CPT4 Code: 2841324 Description: 40102 - WC PHYS LEVEL 2 - EST PT ICD-10 Description Diagnosis L89.153 Pressure ulcer of sacral region, stage 3 G82.22 Paraplegia, incomplete Modifier: Quantity: 1 Electronic Signature(s) Signed: 01/27/2016 1:56:01 PM By: Ardath Sax MD Entered By: Ardath Sax on 01/27/2016 13:56:01

## 2016-01-28 NOTE — Progress Notes (Signed)
[name], [name] ([id number]) Visit Report for 01/14/2016 Chief Complaint Document Details Patient Name: [name], [name] Date of Service: 01/14/2016 3:45 PM Medical Record Number: [id number] Patient Account Number: [id number] Date of Birth/Sex: December 30, 1970 45(45 y.o. Male) Treating RN: Phillis HaggisPinkerton, Debi Primary Care Physician: PATIENT, NO Other Clinician: Referring Physician: Treating Physician/Extender: Rudene ReBritto, Errol Weeks in Treatment: 21 Information Obtained from: Patient Chief Complaint 08/17/15; the patient is recently really relocated [place] [facility] [place]. He is here to establish a wound care for a stage III ulcer on his lower sacral area. Electronic Signature(s) Signed: 01/19/2016 6:03:12 PM By: Alejandro MullingPinkerton, Debra Signed: 01/20/2016 4:25:16 PM By: Evlyn KannerBritto, Errol MD, FACS Previous Signature: 01/14/2016 4:35:24 PM Version By: Evlyn KannerBritto, Errol MD, FACS Entered By: Alejandro MullingPinkerton, Debra on 01/19/2016 17:59:34 [name], [name] ([id number]) -------------------------------------------------------------------------------- Debridement Details Patient Name: [name], [name] Date of Service: 01/14/2016 3:45 PM Medical Record Patient Account Number: [id number] Number: Treating RN: Phillis Haggisinkerton, Debi December 30, 1970 (45 y.o. Other Clinician: Date of Birth/Sex: Male) Treating ROBSON, MICHAEL Primary Care Physician: PATIENT, NO Physician/Extender: G Referring Physician: Weeks in Treatment: 21 Debridement Performed for Wound #1 Left Coccyx Assessment: Performed By: Physician Evlyn KannerBritto, Errol, MD Debridement: Debridement Pre-procedure Yes - 16:24 Verification/Time Out Taken: Start Time: 16:24 Pain Control: Lidocaine 4% Topical Solution Level: Skin/Subcutaneous Tissue Total Area Debrided (L x 0.3 (cm) x 1.1 (cm) = 0.33 (cm) W): Tissue and other Viable, Non-Viable, Exudate, Fibrin/Slough, Subcutaneous material debrided: Instrument: Curette Bleeding: Minimum Hemostasis Achieved: Pressure End Time:  16:26 Procedural Pain: 0 Post Procedural Pain: 0 Response to Treatment: Procedure was tolerated well Post Debridement Measurements of Total Wound Length: (cm) 1.5 Stage: Category/Stage III Width: (cm) 0.7 Depth: (cm) 0.3 Volume: (cm) 0.247 Character of Wound/Ulcer Post Requires Further Debridement: Debridement Severity of Tissue Post Fat layer exposed Debridement: Post Procedure Diagnosis Same as Pre-procedure Electronic Signature(s) Signed: 01/19/2016 6:03:12 PM By: Isidoro DonningPinkerton, Debra Meeker, Zavon ([id number]) Signed: 01/28/2016 8:10:29 AM By: Baltazar Najjarobson, Michael MD Previous Signature: 01/14/2016 4:35:17 PM Version By: Evlyn KannerBritto, Errol MD, FACS Previous Signature: 01/14/2016 5:24:41 PM Version By: Alejandro MullingPinkerton, Debra Entered By: Alejandro MullingPinkerton, Debra on 01/19/2016 17:58:25 [name], [name] ([id number]) -------------------------------------------------------------------------------- HPI Details Patient Name: [name], [name] Date of Service: 01/14/2016 3:45 PM Medical Record Number: [id number] Patient Account Number: [id number] Date of Birth/Sex: December 30, 1970 [age] y.o. 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 He was diagnosed with what sounds like a stage III pressure ulcer in September 2016 although this may have been present for some time longer than that perhaps July 2016. [place] By his description there were using Silvadene cream for a period of time and then 3 months ago change to Aquacel Ag which she is continued and changes daily with help from his mother. [facility] [place] [facility] [place] [facility] [place] He went to urgent care to follow-up on his  wound on 08/04/2015 they did a culture of this wound that showed Pseudomonas and MSSA. He was placed on an antibiotic but doesn't remember which one it is. He has continued using Aquacel Ag and a foam based cover. I don't have any of his records from the wound care [place] although the patient states he did have an MRI that did not show osteomyelitis. He is not aware of having a  bone culture done and doesn't think that he had exposed bone on this and any point. The patient does in and out cath's and has no urinary incontinence issues. He is on appropriate bowel regimen which she administers suppositories. He is able to position himself appropriately and is wheelchair to try and avoid pressure over this wound 08/24/15; small horizontal wound over his lower sacral area. Requires debridement of surface slough and nonviable subcutaneous tissue. There is a small amount of undermining noted no evidence of infection. On the right side of this wound there is some senescent rolled edges which may need debridement I did not do this today. 09/01/15 this is a small wound over the lower sacral area. It has tunneling superiorly. This goes roughly 1 cm. Nonviable subcutaneous tissue at the inferior aspect of the wound base. No real evidence of infection 09/07/15; small horizontal wound over the lower sacral area. Last week it had 1 cm of superior tunneling which appears to of resolved. No evidence of infection 09/21/15; continues to have a small horizontal wound over the lower sacral area. We have been using Prisma and this has contracted. He tells me that his mother is aware medication not able to change the dressing although he states that he is able to do this 09/28/15; a small horizontal wound over his lower sacral area is all that is left of this wound which was probably much larger and deeper at one point. Depth today at 0.7 cm. Base of this had some nonviable subcutaneous tissue that I removed.  Appears to be stalling on current dressing which is Prisma 10/19/15; I have not seen this patient in several weeks. He arrives today with a linear/horizontal wound across his lower sacral area. Considerable depth of this wound. Extensive debridement done 11/17/15; patient arrives for two-week visit. He is not doing well wound is bigger with odor and drainage. Has been using Aquacel Ag. I have not seen this in over a month although I think he was seen by the physician covering me 2 weeks ago on 11/05/15 11/24/15; I brought this man back this week to review the wound after last week's deterioration. Culture grew Enterobacter and some staph aureus [MSSA]. I had ordered him Cipro but we don't seem to of been able to communicate with the patient therefore we've called in today. He is not been systemically unwell. Still using Aquacel 12/15/15; very little change in this wound since the last time I saw it. Horizontal wound over the lower sacral Mendizabal, Kaidence ([id number]) area probably about 2 cm in width and 0.8 cm in depth. There is no evidence of infection. There appears to be callus around this. In spite of the patient's best efforts I think there is an adequate pressure relief here. No current evidence of infection 12/22/15; horizontal pressure ulcer over the lower sacral area. Appears to be filling in and has better dimensions in appearance today. Still circumferential callus which would suggest inadequate offloading although everything else appears to be improved 12/29/15 horizontal pressure wound over the lower sacral area. Appears to be filling in however skin groin down into the divot. No evidence of infection 01/05/16; comes in today with not much change in this wound. There is surrounding callus indicative of pressure. Not much change in the wound bed. This clearly require debridement today we have been using Hydrofera Blue Electronic Signature(s) Signed: 01/19/2016 6:03:12 PM By: Alejandro Mulling Signed: 01/20/2016 4:25:16 PM By: Evlyn Kanner MD, FACS Previous Signature: 01/14/2016 4:35:34 PM Version  By: Evlyn Kanner MD, FACS Entered By: Alejandro Mulling on 01/19/2016 17:59:48 ABDIEL, BLACKERBY ([id number]) -------------------------------------------------------------------------------- Physical Exam Details Patient Name: Matthew Evangelist Date of Service: 01/14/2016 3:45 PM Medical Record Number: [id number] Patient Account Number: [id number] Date of Birth/Sex: [birth year] (45 y.o. Male) Treating RN: Phillis Haggis Primary Care Physician: PATIENT, NO Other Clinician: Referring Physician: Treating Physician/Extender: Rudene Re in Treatment: 21 Constitutional . Pulse regular. Respirations normal and unlabored. Afebrile. . Eyes Nonicteric. Reactive to light. Ears, Nose, Mouth, and Throat Lips, teeth, and gums WNL.Marland Kitchen Moist mucosa without lesions. Neck supple and nontender. No palpable supraclavicular or cervical adenopathy. Normal sized without goiter. Respiratory WNL. No retractions.. Breath sounds WNL, No rubs, rales, rhonchi, or wheeze.. Cardiovascular Heart rhythm and rate regular, no murmur or gallop.. Pedal Pulses WNL. No clubbing, cyanosis or edema. Lymphatic No adneopathy. No adenopathy. No adenopathy. Musculoskeletal Adexa without tenderness or enlargement.. Digits and nails w/o clubbing, cyanosis, infection, petechiae, ischemia, or inflammatory conditions.. Integumentary (Hair, Skin) No suspicious lesions. No crepitus or fluctuance. No peri-wound warmth or erythema. No masses.Marland Kitchen Psychiatric Judgement and insight Intact.. No evidence of depression, anxiety, or agitation.. Notes continues to build a lot of callus around the wound and subcutaneous tissue which also has some maceration. I have sharply debrided this with a #3 curet and bleeding controlled with pressure. Electronic Signature(s) Signed: 01/19/2016 6:03:12 PM By: Alejandro Mulling Signed:  01/20/2016 4:25:16 PM By: Evlyn Kanner MD, FACS Previous Signature: 01/14/2016 4:36:07 PM Version By: Evlyn Kanner MD, FACS Entered By: Alejandro Mulling on 01/19/2016 17:59:56 SIDDHANT, HASHEMI ([id number]) -------------------------------------------------------------------------------- Physician Orders Details Patient Name: Matthew Evangelist Date of Service: 01/14/2016 3:45 PM Medical Record Number: [id number] Patient Account Number: [id number] Date of Birth/Sex: [birth year] (45 y.o. Male) Treating RN: Phillis Haggis Primary Care Physician: PATIENT, NO Other Clinician: Referring Physician: Treating Physician/Extender: Rudene Re in Treatment: 21 Verbal / Phone Orders: Yes Clinician: Pinkerton, Debi Read Back and Verified: Yes Diagnosis Coding Wound Cleansing Wound #1 Left Coccyx o Clean wound with Normal Saline. Anesthetic Wound #1 Left Coccyx o Topical Lidocaine 4% cream applied to wound bed prior to debridement Skin Barriers/Peri-Wound Care Wound #1 Left Coccyx o Skin Prep Primary Wound Dressing o Aquacel Ag Secondary Dressing Wound #1 Left Coccyx o Foam o Tegaderm Dressing Change Frequency Wound #1 Left Coccyx o Change dressing every other day. Follow-up Appointments Wound #1 Left Coccyx o Return Appointment in 1 week. Off-Loading Wound #1 Left Coccyx o Turn and reposition every 2 hours Additional Orders / Instructions Wound #1 Left Coccyx o Increase protein intake. BRANSEN, FASSNACHT ([id number]) Medications-please add to medication list. Wound #1 Left Coccyx o Other: - Vitamin C, A and Zinc Electronic Signature(s) Signed: 01/19/2016 6:03:12 PM By: Alejandro Mulling Signed: 01/20/2016 4:25:16 PM By: Evlyn Kanner MD, FACS Previous Signature: 01/14/2016 5:24:41 PM Version By: Alejandro Mulling Entered By: Alejandro Mulling on 01/19/2016 17:58:50 RONTAE, INGLETT  ([id number]) -------------------------------------------------------------------------------- Problem List Details Patient Name: Matthew Evangelist Date of Service: 01/14/2016 3:45 PM Medical Record Number: [id number] Patient Account Number: [id number] Date of Birth/Sex: Nov 09, 1970 (45 y.o. Male) Treating RN: Phillis Haggis Primary Care Physician: PATIENT, NO Other Clinician: Referring Physician: Treating Physician/Extender: Rudene Re in Treatment: 21 Active Problems ICD-10 Encounter Code Description Active Date Diagnosis L89.153 Pressure ulcer of sacral region, stage 3 08/17/2015 Yes G82.22 Paraplegia, incomplete 08/17/2015 Yes Inactive Problems Resolved Problems Electronic Signature(s) Signed: 01/19/2016 6:03:12 PM By: Alejandro Mulling Signed: 01/20/2016 4:25:16 PM By: Evlyn Kanner MD, FACS Previous Signature: 01/14/2016 4:35:07 PM Version By: Evlyn Kanner MD, FACS  Entered By: Alejandro Mulling on 01/19/2016 17:59:16 Matthew Evangelist ([id number]) -------------------------------------------------------------------------------- Progress Note Details Patient Name: [name], [name] Date of Service: 01/14/2016 3:45 PM Medical Record Number: [id number] Patient Account Number: [id number] Date of Birth/Sex: 12-Feb-1971 (45 y.o. Male) Treating RN: Phillis Haggis Primary Care Physician: PATIENT, NO Other Clinician: Referring Physician: Treating Physician/Extender: Rudene Re in Treatment: 21 Subjective Chief Complaint Information obtained from Patient 08/17/15; the patient is recently really relocated from [facility]. He is here to establish a wound care for a stage III ulcer on his lower sacral area. 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 He was diagnosed with what sounds like a stage III pressure ulcer in September 2016 although this may have  been present for some time longer than that perhaps July 2016. He was followed by wound care in [place]. By his description there were using Silvadene cream for a period of time and then 3 months ago change to Aquacel Ag which she is continued and changes daily with help from his mother. He has recently relocated to [facility]. He went to urgent care to follow- up on his wound on 08/04/2015 they did a culture of this wound that showed Pseudomonas and MSSA. He was placed on an antibiotic but doesn't remember which one it is. He has continued using Aquacel Ag and a foam based cover. I don't have any of his records from the wound care center in [place] although the patient states he did have an MRI that did not show osteomyelitis. He is not aware of having a bone culture done and doesn't think that he had exposed bone on this and any point. The patient does in and out cath's and has no urinary incontinence issues. He is on appropriate bowel regimen which she administers suppositories. He is able to position himself appropriately and is wheelchair to try and avoid pressure over this wound 08/24/15; small horizontal wound over his lower sacral area. Requires debridement of surface slough and nonviable subcutaneous tissue. There is a small amount of undermining noted no evidence of infection. On the right side of this wound there is some senescent rolled edges which may need debridement I did not do this today. 09/01/15 this is a small wound over the lower sacral area. It has tunneling superiorly. This goes roughly 1 cm. Nonviable subcutaneous tissue at the inferior aspect of the wound base. No real evidence of infection 09/07/15; small horizontal wound over the lower sacral area. Last week it had 1 cm of superior tunneling which appears to of resolved. No evidence of infection 09/21/15; continues to have a small horizontal wound over the lower sacral area. We have been using Prisma  and this has contracted. He tells me that his mother is aware medication not able to change the dressing although he states that he is able to do this 09/28/15; a small horizontal wound over his lower sacral area is all that is left of this wound which was probably much larger and deeper at one point. Depth today at 0.7 cm. Base of this had some nonviable subcutaneous tissue that I removed. Appears to be stalling on current dressing which is Prisma 10/19/15; I have not seen this patient in several weeks. He arrives today with a linear/horizontal wound across his lower sacral area. Considerable depth of this wound. Extensive debridement done 11/17/15; patient arrives for two-week visit.  He is not doing well wound is bigger with odor and drainage. Has ZAKRY, CASO ([id number]) been using Aquacel Ag. I have not seen this in over a month although I think he was seen by the physician covering me 2 weeks ago on 11/05/15 11/24/15; I brought this man back this week to review the wound after last week's deterioration. Culture grew Enterobacter and some staph aureus [MSSA]. I had ordered him Cipro but we don't seem to of been able to communicate with the patient therefore we've called in today. He is not been systemically unwell. Still using Aquacel 12/15/15; very little change in this wound since the last time I saw it. Horizontal wound over the lower sacral area probably about 2 cm in width and 0.8 cm in depth. There is no evidence of infection. There appears to be callus around this. In spite of the patient's best efforts I think there is an adequate pressure relief here. No current evidence of infection 12/22/15; horizontal pressure ulcer over the lower sacral area. Appears to be filling in and has better dimensions in appearance today. Still circumferential callus which would suggest inadequate offloading although everything else appears to be improved 12/29/15 horizontal pressure wound over the lower sacral  area. Appears to be filling in however skin groin down into the divot. No evidence of infection 01/05/16; comes in today with not much change in this wound. There is surrounding callus indicative of pressure. Not much change in the wound bed. This clearly require debridement today we have been using Hydrofera Blue Objective Constitutional Pulse regular. Respirations normal and unlabored. Afebrile. Vitals Time Taken: 4:04 PM, Height: 67 in, Weight: 147 lbs, BMI: 23, Temperature: 97.7 F, Pulse: 88 bpm, Respiratory Rate: 16 breaths/min, Blood Pressure: 103/68 mmHg. Eyes Nonicteric. Reactive to light. Ears, Nose, Mouth, and Throat Lips, teeth, and gums WNL.Marland Kitchen Moist mucosa without lesions. Neck supple and nontender. No palpable supraclavicular or cervical adenopathy. Normal sized without goiter. Respiratory WNL. No retractions.. Breath sounds WNL, No rubs, rales, rhonchi, or wheeze.. Cardiovascular Heart rhythm and rate regular, no murmur or gallop.. Pedal Pulses WNL. No clubbing, cyanosis or edema. Lymphatic No adneopathy. No adenopathy. No adenopathy. ARPAN, ESKELSON ([id number]) Musculoskeletal Adexa without tenderness or enlargement.. Digits and nails w/o clubbing, cyanosis, infection, petechiae, ischemia, or inflammatory conditions.Marland Kitchen Psychiatric Judgement and insight Intact.. No evidence of depression, anxiety, or agitation.. General Notes: continues to build a lot of callus around the wound and subcutaneous tissue which also has some maceration. I have sharply debrided this with a #3 curet and bleeding controlled with pressure. Integumentary (Hair, Skin) No suspicious lesions. No crepitus or fluctuance. No peri-wound warmth or erythema. No masses.. Wound #1 status is Open. Original cause of wound was Pressure Injury. The wound is located on the Left Coccyx. The wound measures 0.3cm length x 1.1cm width x 0.4cm depth; 0.259cm^2 area and 0.104cm^3 volume. There is fat exposed. There  is no tunneling or undermining noted. There is a medium amount of serous drainage noted. The wound margin is thickened. There is medium (34-66%) pink granulation within the wound bed. There is a medium (34-66%) amount of necrotic tissue within the wound bed including Adherent Slough. The periwound skin appearance exhibited: Maceration, Moist. The periwound skin appearance did not exhibit: Dry/Scaly. Periwound temperature was noted as No Abnormality. Assessment Active Problems ICD-10 L89.153 - Pressure ulcer of sacral region, stage 3 G82.22 - Paraplegia, incomplete Procedures Wound #1 Wound #1 is a Pressure Ulcer located on the Left Coccyx . There was  a Skin/Subcutaneous Tissue Debridement ([id number]) debridement with total area of 0.33 sq cm performed by Maxwell Caul, MD. with the following instrument(s): Curette to remove Viable and Non-Viable tissue/material including Exudate, Fibrin/Slough, and Subcutaneous after achieving pain control using Lidocaine 4% Topical Solution. A time out was conducted at 16:24, prior to the start of the procedure. A Minimum amount of bleeding was controlled with Pressure. The procedure was tolerated well with a pain level of 0 throughout and a pain level of 0 following the procedure. Post Debridement Measurements: 1.5cm length x 0.7cm width x 0.3cm depth; 0.247cm^3 volume. Post debridement Stage noted as Category/Stage III. MAUDE, GLOOR ([id number]) Character of Wound/Ulcer Post Debridement requires further debridement. Severity of Tissue Post Debridement is: Fat layer exposed. Post procedure Diagnosis Wound #1: Same as Pre-Procedure Plan Wound Cleansing: Wound #1 Left Coccyx: Clean wound with Normal Saline. Anesthetic: Wound #1 Left Coccyx: Topical Lidocaine 4% cream applied to wound bed prior to debridement Skin Barriers/Peri-Wound Care: Wound #1 Left Coccyx: Skin Prep Primary Wound Dressing: Aquacel Ag Secondary Dressing: Wound #1 Left  Coccyx: Foam Tegaderm Dressing Change Frequency: Wound #1 Left Coccyx: Change dressing every other day. Follow-up Appointments: Wound #1 Left Coccyx: Return Appointment in 1 week. Off-Loading: Wound #1 Left Coccyx: Turn and reposition every 2 hours Additional Orders / Instructions: Wound #1 Left Coccyx: Increase protein intake. Medications-please add to medication list.: Wound #1 Left Coccyx: Other: - Vitamin C, A and Zinc After aggressive debridement I have recommended silver alginate to be changed every other day with a bordered foam. I rest offloading and appropriate protein and vitamin supplements. KEMAL, AMORES ([id number]) I understand he is approved for a skin substitute and I will leave that decision to Dr. Leanord Hawking when he sees him next. Electronic Signature(s) Signed: 01/19/2016 6:03:12 PM By: Alejandro Mulling Signed: 01/20/2016 4:25:16 PM By: Evlyn Kanner MD, FACS Previous Signature: 01/14/2016 4:37:23 PM Version By: Evlyn Kanner MD, FACS Entered By: Alejandro Mulling on 01/19/2016 18:00:04 EDI, GORNIAK ([id number]) -------------------------------------------------------------------------------- SuperBill Details Patient Name: Matthew Evangelist Date of Service: 01/14/2016 Medical Record Number: [id number] Patient Account Number: [id number] Date of Birth/Sex: 02/24/1971 (45 y.o. Male) Treating RN: Phillis Haggis Primary Care Physician: PATIENT, NO Other Clinician: Referring Physician: Treating Physician/Extender: Rudene Re in Treatment: 21 Diagnosis Coding ICD-10 Codes Code Description L89.153 Pressure ulcer of sacral region, stage 3 G82.22 Paraplegia, incomplete Facility Procedures CPT4 Code: [id number] Description: 11042 - DEB SUBQ TISSUE 20 SQ CM/< ICD-10 Description Diagnosis L89.153 Pressure ulcer of sacral region, stage 3 G82.22 Paraplegia, incomplete Modifier: Quantity: 1 Physician Procedures CPT4 Code: 5366440 Description: 11042 - WC PHYS SUBQ  TISS 20 SQ CM ICD-10 Description Diagnosis L89.153 Pressure ulcer of sacral region, stage 3 G82.22 Paraplegia, incomplete Modifier: Quantity: 1 Electronic Signature(s) Signed: 01/19/2016 6:03:12 PM By: Alejandro Mulling Signed: 01/20/2016 4:25:16 PM By: Evlyn Kanner MD, FACS Previous Signature: 01/14/2016 4:37:39 PM Version By: Evlyn Kanner MD, FACS Entered By: Alejandro Mulling on 01/19/2016 18:00:09

## 2016-01-28 NOTE — Progress Notes (Signed)
NIVIN, BRANIFF ([id number]) Visit Report for 01/27/2016 Arrival Information Details Patient Name: [name], [name] Date of Service: 01/27/2016 12:45 PM Medical Record Number: [id number] Patient Account Number: [id number] Date of Birth/Sex: [birth year] (45 y.o. Male) Treating RN: Huel Coventry Primary Care Physician: PATIENT, NO Other Clinician: Referring Physician: Treating Physician/Extender: Elayne Snare in Treatment: 23 Visit Information History Since Last Visit Added or deleted any medications: No Patient Arrived: Wheel Chair Any new allergies or adverse reactions: No Arrival Time: 13:02 Had a fall or experienced change in No activities of daily living that may affect Accompanied By: self risk of falls: Transfer Assistance: None Signs or symptoms of abuse/neglect since last No Patient Identification Verified: Yes visito Secondary Verification Process Yes Has Dressing in Place as Prescribed: Yes Completed: Pain Present Now: No Patient Requires Transmission-Based No Precautions: Patient Has Alerts: No Electronic Signature(s) Signed: 01/27/2016 4:28:52 PM By: Elliot Gurney, RN, BSN, Kim RN, BSN Entered By: Elliot Gurney, RN, BSN, Kim on 01/27/2016 13:03:06 [name] ([id number]) -------------------------------------------------------------------------------- Clinic Level of Care Assessment Details Patient Name: [name] Date of Service: 01/27/2016 12:45 PM Medical Record Number: [id number] Patient Account Number: [id number] Date of Birth/Sex: Jul 23, 1970 (45 y.o. Male) Treating RN: Huel Coventry Primary Care Physician: PATIENT, NO Other Clinician: Referring Physician: Treating Physician/Extender: Elayne Snare in Treatment: 23 Clinic Level of Care Assessment Items TOOL 4 Quantity Score []  - Use when only an EandM is performed on FOLLOW-UP visit 0 ASSESSMENTS - Nursing Assessment / Reassessment []  - Reassessment of Co-morbidities (includes updates in patient status) 0 []   - Reassessment of Adherence to Treatment Plan 0 ASSESSMENTS - Wound and Skin Assessment / Reassessment []  - Simple Wound Assessment / Reassessment - one wound 0 []  - Complex Wound Assessment / Reassessment - multiple wounds 0 []  - Dermatologic / Skin Assessment (not related to wound area) 0 ASSESSMENTS - Focused Assessment []  - Circumferential Edema Measurements - multi extremities 0 []  - Nutritional Assessment / Counseling / Intervention 0 []  - Lower Extremity Assessment (monofilament, tuning fork, pulses) 0 []  - Peripheral Arterial Disease Assessment (using hand held doppler) 0 ASSESSMENTS - Ostomy and/or Continence Assessment and Care []  - Incontinence Assessment and Management 0 []  - Ostomy Care Assessment and Management (repouching, etc.) 0 PROCESS - Coordination of Care X - Simple Patient / Family Education for ongoing care 1 15 []  - Complex (extensive) Patient / Family Education for ongoing care 0 X - Staff obtains Chiropractor, Records, Test Results / Process Orders 1 10 []  - Staff telephones HHA, Nursing Homes / Clarify orders / etc 0 []  - Routine Transfer to another Facility (non-emergent condition) 0 JYQUAN, KENLEY ([id number]) []  - Routine Hospital Admission (non-emergent condition) 0 []  - New Admissions / Manufacturing engineer / Ordering NPWT, Apligraf, etc. 0 []  - Emergency Hospital Admission (emergent condition) 0 X - Simple Discharge Coordination 1 10 []  - Complex (extensive) Discharge Coordination 0 PROCESS - Special Needs []  - Pediatric / Minor Patient Management 0 []  - Isolation Patient Management 0 []  - Hearing / Language / Visual special needs 0 []  - Assessment of Community assistance (transportation, D/C planning, etc.) 0 []  - Additional assistance / Altered mentation 0 []  - Support Surface(s) Assessment (bed, cushion, seat, etc.) 0 INTERVENTIONS - Wound Cleansing / Measurement X - Simple Wound Cleansing - one wound 1 5 []  - Complex Wound Cleansing - multiple  wounds 0 X - Wound Imaging (photographs - any number of wounds) 1 5 []  - Wound Tracing (instead of photographs) 0 X - Simple Wound  Measurement - one wound 1 5 []  - Complex Wound Measurement - multiple wounds 0 INTERVENTIONS - Wound Dressings X - Small Wound Dressing one or multiple wounds 1 10 []  - Medium Wound Dressing one or multiple wounds 0 []  - Large Wound Dressing one or multiple wounds 0 []  - Application of Medications - topical 0 []  - Application of Medications - injection 0 INTERVENTIONS - Miscellaneous []  - External ear exam 0 Jeffrey, Graefe Dessie ([id number]) []  - Specimen Collection (cultures, biopsies, blood, body fluids, etc.) 0 []  - Specimen(s) / Culture(s) sent or taken to Lab for analysis 0 []  - Patient Transfer (multiple staff / Michiel Sites Lift / Similar devices) 0 []  - Simple Staple / Suture removal (25 or less) 0 []  - Complex Staple / Suture removal (26 or more) 0 []  - Hypo / Hyperglycemic Management (close monitor of Blood Glucose) 0 []  - Ankle / Brachial Index (ABI) - do not check if billed separately 0 X - Vital Signs 1 5 Has the patient been seen at the hospital within the last three years: Yes Total Score: 65 Level Of Care: New/Established - Level 2 Electronic Signature(s) Signed: 01/27/2016 4:28:52 PM By: Elliot Gurney, RN, BSN, Kim RN, BSN Entered By: Elliot Gurney, RN, BSN, Kim on 01/27/2016 13:18:22 [name] ([id number]) -------------------------------------------------------------------------------- Encounter Discharge Information Details Patient Name: [name] Date of Service: 01/27/2016 12:45 PM Medical Record Number: [id number] Patient Account Number: [id number] Date of Birth/Sex: 06-05-1970 (45 y.o. Male) Treating RN: Huel Coventry Primary Care Physician: PATIENT, NO Other Clinician: Referring Physician: Treating Physician/Extender: Elayne Snare in Treatment: 23 Encounter Discharge Information Items Discharge Pain Level: 0 Discharge Condition:  Stable Ambulatory Status: Wheelchair Discharge Destination: Home Transportation: Private Auto Accompanied By: self Schedule Follow-up Appointment: Yes Medication Reconciliation completed and provided to Patient/Care Yes Ignatius Kloos: Provided on Clinical Summary of Care: 01/27/2016 Form Type Recipient Paper Patient [facility] Electronic Signature(s) Signed: 01/27/2016 1:56:27 PM By: Ardath Sax MD Previous Signature: 01/27/2016 1:19:50 PM Version By: Gwenlyn Perking Entered By: Ardath Sax on 01/27/2016 13:56:27 EMIR, NACK ([id number]) -------------------------------------------------------------------------------- Multi Wound Chart Details Patient Name: [name] Date of Service: 01/27/2016 12:45 PM Medical Record Number: [id number] Patient Account Number: [id number] Date of Birth/Sex: [birth year] (45 y.o. Male) Treating RN: Huel Coventry Primary Care Physician: PATIENT, NO Other Clinician: Referring Physician: Treating Physician/Extender: Elayne Snare in Treatment: 23 Vital Signs Height(in): 67 Pulse(bpm): 64 Weight(lbs): 147 Blood Pressure 110/64 (mmHg): Body Mass Index(BMI): 23 Temperature(F): 98.1 Respiratory Rate 16 (breaths/min): Photos: [N/A:N/A] Wound Location: Left Coccyx N/A N/A Wounding Event: Pressure Injury N/A N/A Primary Etiology: Pressure Ulcer N/A N/A Comorbid History: Paraplegia N/A N/A Date Acquired: 12/24/2014 N/A N/A Weeks of Treatment: 23 N/A N/A Wound Status: Open N/A N/A Measurements L x W x D 0.9x1.5x0.5 N/A N/A (cm) Area (cm) : 1.06 N/A N/A Volume (cm) : 0.53 N/A N/A % Reduction in Area: 0.00% N/A N/A % Reduction in Volume: 0.00% N/A N/A Classification: Category/Stage III N/A N/A Exudate Amount: Large N/A N/A Exudate Type: Serous N/A N/A Exudate Color: amber N/A N/A Foul Odor After Yes N/A N/A Cleansing: Odor Anticipated Due to No N/A N/A Product Use: Wound Margin: Thickened N/A N/A Granulation Amount: Medium (34-66%) N/A  N/A Granulation Quality: Pink N/A N/A CLOUD, GRAHAM ([id number]) Necrotic Amount: Medium (34-66%) N/A N/A Exposed Structures: Fat: Yes N/A N/A Fascia: No Tendon: No Muscle: No Joint: No Bone: No Epithelialization: None N/A N/A Periwound Skin Texture: Callus: Yes N/A N/A Periwound Skin Maceration: Yes N/A N/A Moisture: Moist: Yes Dry/Scaly: No Periwound  Skin Color: No Abnormalities Noted N/A N/A Temperature: No Abnormality N/A N/A Tenderness on No N/A N/A Palpation: Wound Preparation: Ulcer Cleansing: N/A N/A Rinsed/Irrigated with Saline Topical Anesthetic Applied: Other: lidocaine 4% Treatment Notes Electronic Signature(s) Signed: 01/27/2016 4:28:52 PM By: Elliot GurneyWoody, RN, BSN, Kim RN, BSN Entered By: Elliot GurneyWoody, RN, BSN, Kim on 01/27/2016 13:08:38 Matthew EvangelistHARRIS, Jheremy ([id number]) -------------------------------------------------------------------------------- Multi-Disciplinary Care Plan Details Patient Name: Matthew EvangelistHARRIS, Deron Date of Service: 01/27/2016 12:45 PM Medical Record Number: [id number] Patient Account Number: [id number] Date of Birth/Sex: January 13, 1971 [age] y.o. Male) Treating RN: Huel CoventryWoody, Kim Primary Care Physician: PATIENT, NO Other Clinician: Referring Physician: Treating Physician/Extender: Elayne SnarePARKER, PETER Weeks in Treatment: 7023 Active Inactive Abuse / Safety / Falls / Self Care Management Nursing Diagnoses: Potential for falls Goals: Patient will remain injury free Date Initiated: 08/17/2015 Goal Status: Active Interventions: Assess fall risk on admission and as needed Notes: Nutrition Nursing Diagnoses: Imbalanced nutrition Goals: Patient/caregiver agrees to and verbalizes understanding of need to use nutritional supplements and/or vitamins as prescribed Date Initiated: 08/17/2015 Goal Status: Active Interventions: Assess patient nutrition upon admission and as needed per policy Notes: Orientation to the Wound Care Program Nursing Diagnoses: Knowledge deficit  related to the wound healing center program Goals: Patient/caregiver will verbalize understanding of the Wound Healing Center Program Matthew EvangelistHARRIS, Micha ([id number]) Date Initiated: 08/17/2015 Goal Status: Active Interventions: Provide education on orientation to the wound center Notes: Pressure Nursing Diagnoses: Knowledge deficit related to causes and risk factors for pressure ulcer development Goals: Patient will remain free from development of additional pressure ulcers Date Initiated: 08/17/2015 Goal Status: Active Interventions: Assess: immobility, friction, shearing, incontinence upon admission and as needed Notes: Wound/Skin Impairment Nursing Diagnoses: Impaired tissue integrity Goals: Ulcer/skin breakdown will have a volume reduction of 30% by week 4 Date Initiated: 08/17/2015 Goal Status: Active Ulcer/skin breakdown will have a volume reduction of 50% by week 8 Date Initiated: 08/17/2015 Goal Status: Active Ulcer/skin breakdown will have a volume reduction of 80% by week 12 Date Initiated: 08/17/2015 Goal Status: Active Interventions: Assess patient/caregiver ability to obtain necessary supplies Assess ulceration(s) every visit Notes: Electronic Signature(s) Signed: 01/27/2016 4:28:52 PM By: Elliot GurneyWoody, RN, BSN, Kim RN, BSN [place]ARRIS, Twin LakesHAR[place] ([id number]) Entered By: Elliot GurneyWoody, RN, BSN, Kim on 01/27/2016 13:08:09 Matthew EvangelistHARRIS, Beckhem ([id number]) -------------------------------------------------------------------------------- Pain Assessment Details Patient Name: Matthew EvangelistHARRIS, Javaun Date of Service: 01/27/2016 12:45 PM Medical Record Number: [id number] Patient Account Number: [id number] Date of Birth/Sex: January 13, 1971 [age] y.o. Male) Treating RN: Huel CoventryWoody, Kim Primary Care Physician: PATIENT, NO Other Clinician: Referring Physician: Treating Physician/Extender: Elayne SnarePARKER, PETER Weeks in Treatment: 23 Active Problems Location of Pain Severity and Description of Pain Patient Has Paino No Site  Locations With Dressing Change: No Pain Management and Medication Current Pain Management: Electronic Signature(s) Signed: 01/27/2016 4:28:52 PM By: Elliot GurneyWoody, RN, BSN, Kim RN, BSN Entered By: Elliot GurneyWoody, RN, BSN, Kim on 01/27/2016 13:03:23 Matthew EvangelistHARRIS, Jaiden ([id number]) -------------------------------------------------------------------------------- Patient/Caregiver Education Details Patient Name: Matthew EvangelistHARRIS, Ugochukwu Date of Service: 01/27/2016 12:45 PM Medical Record Number: [id number] Patient Account Number: [id number] Date of Birth/Gender: January 13, 1971 [age] y.o. Male) Treating RN: Huel CoventryWoody, Kim Primary Care Physician: PATIENT, NO Other Clinician: Referring Physician: Treating Physician/Extender: Elayne SnarePARKER, PETER Weeks in Treatment: 23 Education Assessment Education Provided To: Patient Education Topics Provided Wound/Skin Impairment: Handouts: Caring for Your Ulcer Methods: Demonstration Responses: State content correctly Electronic Signature(s) Signed: 01/27/2016 3:59:06 PM By: Ardath SaxParker, Peter MD Entered By: Ardath SaxParker, Peter on 01/27/2016 13:56:34 Matthew EvangelistHARRIS, Trevel ([id number]) -------------------------------------------------------------------------------- Wound Assessment Details Patient Name: Matthew EvangelistHARRIS, Nyzier Date of Service: 01/27/2016 12:45 PM Medical Record Number: [id number] Patient Account Number:  [id number] Date of Birth/Sex: 1970-09-29 (45 y.o. Male) Treating RN: Huel Coventry Primary Care Physician: PATIENT, NO Other Clinician: Referring Physician: Treating Physician/Extender: Elayne Snare in Treatment: 23 Wound Status Wound Number: 1 Primary Etiology: Pressure Ulcer Wound Location: Left Coccyx Wound Status: Open Wounding Event: Pressure Injury Comorbid History: Paraplegia Date Acquired: 12/24/2014 Weeks Of Treatment: 23 Clustered Wound: No Photos Wound Measurements Length: (cm) 0.9 Width: (cm) 1.5 Depth: (cm) 0.5 Area: (cm) 1.06 Volume: (cm) 0.53 % Reduction in Area: 0% %  Reduction in Volume: 0% Epithelialization: None Tunneling: No Undermining: No Wound Description Classification: Category/Stage III Foul Odor Aft Wound Margin: Thickened Due to Produc Exudate Amount: Large Exudate Type: Serous Exudate Color: amber er Cleansing: Yes t Use: No Wound Bed Granulation Amount: Medium (34-66%) Exposed Structure Granulation Quality: Pink Fascia Exposed: No Necrotic Amount: Medium (34-66%) Fat Layer Exposed: Yes Necrotic Quality: Adherent Slough Tendon Exposed: No Muscle Exposed: No Joint Exposed: No Bone Exposed: No Poucher, Wenceslao ([id number]) Periwound Skin Texture Texture Color No Abnormalities Noted: No No Abnormalities Noted: No Callus: Yes Temperature / Pain Moisture Temperature: No Abnormality No Abnormalities Noted: No Dry / Scaly: No Maceration: Yes Moist: Yes Wound Preparation Ulcer Cleansing: Rinsed/Irrigated with Saline Topical Anesthetic Applied: Other: lidocaine 4%, Treatment Notes Wound #1 (Left Coccyx) 1. Cleansed with: Clean wound with Normal Saline 2. Anesthetic Topical Lidocaine 4% cream to wound bed prior to debridement 3. Peri-wound Care: Skin Prep 4. Dressing Applied: Aquacel Ag 5. Secondary Dressing Applied Bordered Foam Dressing Electronic Signature(s) Signed: 01/27/2016 4:28:52 PM By: Elliot Gurney, RN, BSN, Kim RN, BSN Entered By: Elliot Gurney, RN, BSN, Kim on 01/27/2016 13:08:00 [name] ([id number]) -------------------------------------------------------------------------------- Vitals Details Patient Name: [name] Date of Service: 01/27/2016 12:45 PM Medical Record Number: [id number] Patient Account Number: [id number] Date of Birth/Sex: 10-Jan-1971 (45 y.o. Male) Treating RN: Huel Coventry Primary Care Physician: PATIENT, NO Other Clinician: Referring Physician: Treating Physician/Extender: Elayne Snare in Treatment: 23 Vital Signs Time Taken: 13:03 Temperature (F): 98.1 Height (in): 67 Pulse  (bpm): 64 Weight (lbs): 147 Respiratory Rate (breaths/min): 16 Body Mass Index (BMI): 23 Blood Pressure (mmHg): 110/64 Reference Range: 80 - 120 mg / dl Electronic Signature(s) Signed: 01/27/2016 4:28:52 PM By: Elliot Gurney, RN, BSN, Kim RN, BSN Entered By: Elliot Gurney, RN, BSN, Kim on 01/27/2016 13:03:54

## 2016-02-01 ENCOUNTER — Telehealth: Payer: Self-pay | Admitting: Physical Medicine & Rehabilitation

## 2016-02-01 MED ORDER — BACLOFEN 10 MG PO TABS: 10.0000 mg | ORAL_TABLET | Freq: Three times a day (TID) | ORAL | 1 refills | Status: DC

## 2016-02-01 NOTE — Telephone Encounter (Signed)
Patient needs a refill on Baclofen.

## 2016-02-01 NOTE — Telephone Encounter (Signed)
Consulted Dr. Jill AlexandersPatels note, sent refill request electronically to [facility], attempted to notify pt. Left VM on his home phone

## 2016-02-03 ENCOUNTER — Ambulatory Visit: Payer: Self-pay | Admitting: General Surgery

## 2016-02-04 ENCOUNTER — Ambulatory Visit: Payer: Self-pay | Admitting: Physical Medicine & Rehabilitation

## 2016-02-04 ENCOUNTER — Ambulatory Visit (INDEPENDENT_AMBULATORY_CARE_PROVIDER_SITE_OTHER): Payer: Medicare Other | Admitting: Psychology

## 2016-02-04 ENCOUNTER — Encounter: Payer: Medicare Other | Admitting: Nurse Practitioner

## 2016-02-04 DIAGNOSIS — L89153 Pressure ulcer of sacral region, stage 3: Secondary | ICD-10-CM | POA: Diagnosis not present

## 2016-02-04 DIAGNOSIS — G8222 Paraplegia, incomplete: Secondary | ICD-10-CM | POA: Diagnosis not present

## 2016-02-04 DIAGNOSIS — F3132 Bipolar disorder, current episode depressed, moderate: Secondary | ICD-10-CM

## 2016-02-04 DIAGNOSIS — F17210 Nicotine dependence, cigarettes, uncomplicated: Secondary | ICD-10-CM | POA: Diagnosis not present

## 2016-02-04 DIAGNOSIS — Z8249 Family history of ischemic heart disease and other diseases of the circulatory system: Secondary | ICD-10-CM | POA: Diagnosis not present

## 2016-02-05 NOTE — Progress Notes (Signed)
ASHTYN, FREILICH ([id number]) Visit Report for 02/04/2016 Arrival Information Details Patient Name: [name], [name] Date of Service: 02/04/2016 2:15 PM Medical Record Number: [id number] Patient Account Number: [id number] Date of Birth/Sex: 1970/06/14 (45 y.o. Male) Treating RN: Clover Mealy, RN, BSN, [place] Primary Care Physician: PATIENT, NO Other Clinician: Referring Physician: Treating Physician/Extender: Eugene Garnet in Treatment: 24 Visit Information History Since Last Visit All ordered tests and consults were completed: No Patient Arrived: Wheel Chair Added or deleted any medications: No Arrival Time: 14:20 Any new allergies or adverse reactions: No Accompanied By: self Had a fall or experienced change in No activities of daily living that may affect Transfer Assistance: None risk of falls: Patient Identification Verified: Yes Signs or symptoms of abuse/neglect since last No Secondary Verification Process Yes visito Completed: Has Dressing in Place as Prescribed: Yes Patient Requires Transmission-Based No Has Compression in Place as Prescribed: Yes Precautions: Pain Present Now: No Patient Has Alerts: No Electronic Signature(s) Signed: 02/04/2016 2:52:15 PM By: Elpidio Eric BSN, RN Entered By: Elpidio Eric on 02/04/2016 14:52:14 [name] ([id number]) -------------------------------------------------------------------------------- Encounter Discharge Information Details Patient Name: [name] Date of Service: 02/04/2016 2:15 PM Medical Record Number: [id number] Patient Account Number: [id number] Date of Birth/Sex: 1970/08/13 (45 y.o. Male) Treating RN: Clover Mealy, RN, BSN, Rita Primary Care Physician: PATIENT, NO Other Clinician: Referring Physician: Treating Physician/Extender: Eugene Garnet in Treatment: 24 Encounter Discharge Information Items Discharge Pain Level: 0 Discharge Condition: Stable Ambulatory Status: Wheelchair Discharge  Destination: Home Transportation: Private Auto Accompanied By: self Schedule Follow-up Appointment: No Medication Reconciliation completed and provided to Patient/Care No Leopold Smyers: Provided on Clinical Summary of Care: 02/04/2016 Form Type Recipient Paper Patient [facility] Electronic Signature(s) Signed: 02/04/2016 2:43:43 PM By: Gwenlyn Perking Entered By: Gwenlyn Perking on 02/04/2016 14:43:43 [name] ([id number]) -------------------------------------------------------------------------------- Lower Extremity Assessment Details Patient Name: [name] Date of Service: 02/04/2016 2:15 PM Medical Record Number: [id number] Patient Account Number: [id number] Date of Birth/Sex: 1970/05/30 (44 y.o. Male) Treating RN: Clover Mealy, RN, BSN, Rita Primary Care Physician: PATIENT, NO Other Clinician: Referring Physician: Treating Physician/Extender: Eugene Garnet in Treatment: 24 Electronic Signature(s) Signed: 02/04/2016 2:52:47 PM By: Elpidio Eric BSN, RN Entered By: Elpidio Eric on 02/04/2016 14:52:47 SHAHMEER, BUNN ([id number]) -------------------------------------------------------------------------------- Multi Wound Chart Details Patient Name: [name] Date of Service: 02/04/2016 2:15 PM Medical Record Number: [id number] Patient Account Number: [id number] Date of Birth/Sex: 09-Aug-1970 (45 y.o. Male) Treating RN: Clover Mealy, RN, BSN, Rita Primary Care Physician: PATIENT, NO Other Clinician: Referring Physician: Treating Physician/Extender: Eugene Garnet in Treatment: 24 Vital Signs Height(in): 67 Pulse(bpm): 88 Weight(lbs): 147 Blood Pressure 130/79 (mmHg): Body Mass Index(BMI): 23 Temperature(F): 97.8 Respiratory Rate 16 (breaths/min): Photos: [1:No Photos] [N/A:N/A] Wound Location: [1:Left Coccyx] [N/A:N/A] Wounding Event: [1:Pressure Injury] [N/A:N/A] Primary Etiology: [1:Pressure Ulcer] [N/A:N/A] Comorbid History: [1:Paraplegia] [N/A:N/A] Date  Acquired: [1:12/24/2014] [N/A:N/A] Weeks of Treatment: [1:24] [N/A:N/A] Wound Status: [1:Open] [N/A:N/A] Measurements L x W x D 2x1x0.5 [N/A:N/A] (cm) Area (cm) : [1:1.571] [N/A:N/A] Volume (cm) : [1:0.785] [N/A:N/A] % Reduction in Area: [1:-48.20%] [N/A:N/A] % Reduction in Volume: -48.10% [N/A:N/A] Classification: [1:Category/Stage III] [N/A:N/A] Exudate Amount: [1:Large] [N/A:N/A] Exudate Type: [1:Serous] [N/A:N/A] Exudate Color: [1:amber] [N/A:N/A] Foul Odor After [1:Yes] [N/A:N/A] Cleansing: Odor Anticipated Due to No [N/A:N/A] Product Use: Wound Margin: [1:Thickened] [N/A:N/A] Granulation Amount: [1:Medium (34-66%)] [N/A:N/A] Granulation Quality: [1:Pink] [N/A:N/A] Necrotic Amount: [1:Medium (34-66%)] [N/A:N/A] Exposed Structures: [1:Fat: Yes Fascia: No Tendon: No Muscle: No] [N/A:N/A] Joint: No Bone: No Epithelialization: None N/A N/A Debridement: Debridement ([id number] N/A N/A [id number]) Pre-procedure 14:30 N/A N/A Verification/Time Out  Taken: Pain Control: Lidocaine 4% Topical N/A N/A Solution Tissue Debrided: Fibrin/Slough, Callus, N/A N/A Subcutaneous Level: Skin/Subcutaneous N/A N/A Tissue/Muscle Debridement Area (sq 2 N/A N/A cm): Instrument: Curette N/A N/A Bleeding: Minimum N/A N/A Hemostasis Achieved: Pressure N/A N/A Procedural Pain: Insensate N/A N/A Post Procedural Pain: Insensate N/A N/A Debridement Treatment Procedure was tolerated N/A N/A Response: well Post Debridement 2x1x0.5 N/A N/A Measurements L x W x D (cm) Post Debridement 0.785 N/A N/A Volume: (cm) Post Debridement Category/Stage III N/A N/A Stage: Periwound Skin Texture: Callus: Yes N/A N/A Periwound Skin Maceration: Yes N/A N/A Moisture: Moist: Yes Dry/Scaly: No Periwound Skin Color: No Abnormalities Noted N/A N/A Temperature: No Abnormality N/A N/A Tenderness on No N/A N/A Palpation: Wound Preparation: Ulcer Cleansing: N/A N/A Rinsed/Irrigated with Saline Topical  Anesthetic Applied: Other: lidocaine 4% Procedures Performed: Debridement N/A N/A Treatment Notes Wound #1 (Left Coccyx) Paver, Trayshawn ([id number]) 1. Cleansed with: Clean wound with Normal Saline 3. Peri-wound Care: Skin Prep 4. Dressing Applied: Aquacel Ag 5. Secondary Dressing Applied Bordered Foam Dressing Electronic Signature(s) Signed: 02/04/2016 2:53:25 PM By: Elpidio EricAfful, Rita BSN, RN Entered By: Elpidio EricAfful, Rita on 02/04/2016 14:53:25 [name], Saksham ([id number]) -------------------------------------------------------------------------------- Multi-Disciplinary Care Plan Details Patient Name: [name], [name] Date of Service: 02/04/2016 2:15 PM Medical Record Number: [id number] Patient Account Number: [id number] Date of Birth/Sex: Aug 17, 1970 10(45 y.o. Male) Treating RN: Clover MealyAfful, RN, BSN, [place] Primary Care Physician: PATIENT, NO Other Clinician: Referring Physician: Treating Physician/Extender: Eugene GarnetSaunders, Sharon Weeks in Treatment: 24 Active Inactive Abuse / Safety / Falls / Self Care Management Nursing Diagnoses: Potential for falls Goals: Patient will remain injury free Date Initiated: 08/17/2015 Goal Status: Active Interventions: Assess fall risk on admission and as needed Notes: Nutrition Nursing Diagnoses: Imbalanced nutrition Goals: Patient/caregiver agrees to and verbalizes understanding of need to use nutritional supplements and/or vitamins as prescribed Date Initiated: 08/17/2015 Goal Status: Active Interventions: Assess patient nutrition upon admission and as needed per policy Notes: Orientation to the Wound Care Program Nursing Diagnoses: Knowledge deficit related to the wound healing center program Goals: Patient/caregiver will verbalize understanding of the Wound Healing Center Program [name], [name] ([id number]) Date Initiated: 08/17/2015 Goal Status: Active Interventions: Provide education on orientation to the wound center Notes: Pressure Nursing  Diagnoses: Knowledge deficit related to causes and risk factors for pressure ulcer development Goals: Patient will remain free from development of additional pressure ulcers Date Initiated: 08/17/2015 Goal Status: Active Interventions: Assess: immobility, friction, shearing, incontinence upon admission and as needed Notes: Wound/Skin Impairment Nursing Diagnoses: Impaired tissue integrity Goals: Ulcer/skin breakdown will have a volume reduction of 30% by week 4 Date Initiated: 08/17/2015 Goal Status: Active Ulcer/skin breakdown will have a volume reduction of 50% by week 8 Date Initiated: 08/17/2015 Goal Status: Active Ulcer/skin breakdown will have a volume reduction of 80% by week 12 Date Initiated: 08/17/2015 Goal Status: Active Interventions: Assess patient/caregiver ability to obtain necessary supplies Assess ulceration(s) every visit Notes: Electronic Signature(s) Signed: 02/04/2016 2:53:00 PM By: Elpidio EricAfful, Rita BSN, RN [place], [place] ([id number]) Entered By: Elpidio EricAfful, Rita on 02/04/2016 14:53:00 [name], [name] ([id number]) -------------------------------------------------------------------------------- Pain Assessment Details Patient Name: [name], [name] Date of Service: 02/04/2016 2:15 PM Medical Record Number: [id number] Patient Account Number: [id number] Date of Birth/Sex: Aug 17, 1970 [age] y.o. Male) Treating RN: Clover MealyAfful, RN, BSN, Rita Primary Care Physician: PATIENT, NO Other Clinician: Referring Physician: Treating Physician/Extender: Eugene GarnetSaunders, Sharon Weeks in Treatment: 24 Active Problems Location of Pain Severity and Description of Pain Patient Has Paino No Site Locations With Dressing Change: No Pain Management and Medication Current Pain  Management: Electronic Signature(s) Signed: 02/04/2016 2:52:25 PM By: Elpidio Eric BSN, RN Entered By: Elpidio Eric on 02/04/2016 14:52:25 [name]  ([id number]) -------------------------------------------------------------------------------- Patient/Caregiver Education Details Patient Name: [name] Date of Service: 02/04/2016 2:15 PM Medical Record Number: [id number] Patient Account Number: [id number] Date of Birth/Gender: [birth year] (45 y.o. Male) Treating RN: Clover Mealy, RN, BSN, [place] Primary Care Physician: PATIENT, NO Other Clinician: Referring Physician: Treating Physician/Extender: Eugene Garnet in Treatment: 24 Education Assessment Education Provided To: Patient Education Topics Provided Welcome To The Wound Care Center: Methods: Explain/Verbal Responses: State content correctly Wound Debridement: Methods: Explain/Verbal Responses: State content correctly Wound/Skin Impairment: Methods: Explain/Verbal Responses: State content correctly Electronic Signature(s) Signed: 02/04/2016 4:35:15 PM By: Elpidio Eric BSN, RN Entered By: Elpidio Eric on 02/04/2016 14:46:13 [name] ([id number]) -------------------------------------------------------------------------------- Wound Assessment Details Patient Name: [name] Date of Service: 02/04/2016 2:15 PM Medical Record Number: [id number] Patient Account Number: [id number] Date of Birth/Sex: 04-05-1971 (45 y.o. Male) Treating RN: Clover Mealy, RN, BSN, Rita Primary Care Physician: PATIENT, NO Other Clinician: Referring Physician: Treating Physician/Extender: Elayne Snare in Treatment: 24 Wound Status Wound Number: 1 Primary Etiology: Pressure Ulcer Wound Location: Left Coccyx Wound Status: Open Wounding Event: Pressure Injury Comorbid History: Paraplegia Date Acquired: 12/24/2014 Weeks Of Treatment: 24 Clustered Wound: No Photos Photo Uploaded By: Elpidio Eric on 02/04/2016 15:36:18 Wound Measurements Length: (cm) 2 Width: (cm) 1 Depth: (cm) 0.5 Area: (cm) 1.571 Volume: (cm) 0.785 % Reduction in Area: -48.2% % Reduction in Volume:  -48.1% Epithelialization: None Tunneling: No Undermining: No Wound Description Classification: Category/Stage III Foul Odor Aft Wound Margin: Thickened Due to Produc Exudate Amount: Large Exudate Type: Serous Exudate Color: amber er Cleansing: Yes t Use: No Wound Bed Granulation Amount: Medium (34-66%) Exposed Structure Granulation Quality: Pink Fascia Exposed: No Necrotic Amount: Medium (34-66%) Fat Layer Exposed: Yes Necrotic Quality: Adherent Slough Tendon Exposed: No Muscle Exposed: No Joint Exposed: No Bone Exposed: No Zachery, Trayson ([id number]) Periwound Skin Texture Texture Color No Abnormalities Noted: No No Abnormalities Noted: No Callus: Yes Temperature / Pain Moisture Temperature: No Abnormality No Abnormalities Noted: No Dry / Scaly: No Maceration: Yes Moist: Yes Wound Preparation Ulcer Cleansing: Rinsed/Irrigated with Saline Topical Anesthetic Applied: Other: lidocaine 4%, Treatment Notes Wound #1 (Left Coccyx) 1. Cleansed with: Clean wound with Normal Saline 3. Peri-wound Care: Skin Prep 4. Dressing Applied: Aquacel Ag 5. Secondary Dressing Applied Bordered Foam Dressing Electronic Signature(s) Signed: 02/04/2016 4:35:15 PM By: Elpidio Eric BSN, RN Entered By: Elpidio Eric on 02/04/2016 14:27:37 EION, TIMBROOK ([id number]) -------------------------------------------------------------------------------- Vitals Details Patient Name: [name] Date of Service: 02/04/2016 2:15 PM Medical Record Number: [id number] Patient Account Number: [id number] Date of Birth/Sex: [birth year] (45 y.o. Male) Treating RN: Clover Mealy, RN, BSN, Rita Primary Care Physician: PATIENT, NO Other Clinician: Referring Physician: Treating Physician/Extender: Eugene Garnet in Treatment: 24 Vital Signs Time Taken: 14:23 Temperature (F): 97.8 Height (in): 67 Pulse (bpm): 88 Weight (lbs): 147 Respiratory Rate (breaths/min): 16 Body Mass Index (BMI): 23 Blood  Pressure (mmHg): 130/79 Reference Range: 80 - 120 mg / dl Electronic Signature(s) Signed: 02/04/2016 2:52:35 PM By: Elpidio Eric BSN, RN Entered By: Elpidio Eric on 02/04/2016 14:52:35

## 2016-02-07 NOTE — Progress Notes (Signed)
ARSHAN, JABS ([id number]) Visit Report for 02/04/2016 Chief Complaint Document Details Patient Name: [name], [name] Date of Service: 02/04/2016 2:15 PM Medical Record Number: [id number] Patient Account Number: [id number] Date of Birth/Sex: 09/15/1970 (45 y.o. Male) Treating RN: Clover Mealy, RN, BSN, [place] Sink Primary Care Physician: PATIENT, NO Other Clinician: Referring Physician: Treating Physician/Extender: Eugene Garnet in Treatment: 24 Information Obtained from: Patient Chief Complaint 08/17/15; the patient is recently really relocated from [facility]. He is here to establish a wound care for a stage III ulcer on his lower sacral area. Electronic Signature(s) Signed: 02/04/2016 5:31:30 PM By: Georges Lynch FNP Entered By: Georges Lynch on 02/04/2016 14:58:54 [name] ([id number]) -------------------------------------------------------------------------------- Debridement Details Patient Name: [name] Date of Service: 02/04/2016 2:15 PM Medical Record Number: [id number] Patient Account Number: [id number] Date of Birth/Sex: [birth year] (45 y.o. Male) Treating RN: Huel Coventry Primary Care Physician: PATIENT, NO Other Clinician: Referring Physician: Treating Physician/Extender: Eugene Garnet in Treatment: 24 Debridement Performed for Wound #1 Left Coccyx Assessment: Performed By: Physician Georges Lynch, NP Debridement: Debridement Pre-procedure Yes - 14:30 Verification/Time Out Taken: Start Time: 14:30 Pain Control: Lidocaine 4% Topical Solution Level: Skin/Subcutaneous Tissue/Muscle Total Area Debrided (L x 2 (cm) x 1 (cm) = 2 (cm) W): Tissue and other Non-Viable, Callus, Fibrin/Slough, Subcutaneous material debrided: Instrument: Curette Bleeding: Minimum Hemostasis Achieved: Pressure End Time: 14:35 Procedural Pain: Insensate Post Procedural Pain: Insensate Response to Treatment: Procedure was tolerated well Post  Debridement Measurements of Total Wound Length: (cm) 2 Stage: Category/Stage III Width: (cm) 1 Depth: (cm) 0.5 Volume: (cm) 0.785 Character of Wound/Ulcer Post Requires Further Debridement: Debridement Severity of Tissue Post Fat layer exposed Debridement: Post Procedure Diagnosis Same as Pre-procedure Electronic Signature(s) Signed: 02/07/2016 1:13:14 PM By: Elliot Gurney, RN, BSN, Kim RN, BSN Signed: 02/07/2016 2:44:42 PM By: Georges Lynch FNP Previous Signature: 02/04/2016 4:35:15 PM Version By: Elpidio Eric BSN, RN [place], Leonette Most ([id number]) Entered By: Elliot Gurney RN, BSN, Kim on 02/07/2016 13:11:46 AARIT, KASHUBA ([id number]) -------------------------------------------------------------------------------- HPI Details Patient Name: [name] Date of Service: 02/04/2016 2:15 PM Medical Record Number: [id number] Patient Account Number: [id number] Date of Birth/Sex: [birth year] (45 y.o. 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 He was diagnosed with what sounds like a stage III pressure ulcer in September 2016 although this may have been present for some time longer than that perhaps July 2016. He was followed by wound care in [place]. By his description there were using Silvadene cream for a period of time and then 3 months ago change to Aquacel Ag which she is continued and changes daily with help from his mother. He has recently relocated to [facility]. He went to urgent care to follow-up on his wound on 08/04/2015 they did a culture of this wound that showed Pseudomonas and MSSA. He was placed on an antibiotic but doesn't remember which one it is. He  has continued using Aquacel Ag and a foam based cover. I don't have any of his records from the wound care center in [place] although the patient states he did have an MRI that did not show osteomyelitis. He is not aware of having a bone culture done and doesn't think that he had exposed bone on this and any point. The patient does in  and out cath's and has no urinary incontinence issues. He is on appropriate bowel regimen which she administers suppositories. He is able to position himself appropriately and is wheelchair to try and avoid pressure over this wound 08/24/15; small horizontal wound over his lower sacral area. Requires debridement of surface slough and nonviable subcutaneous tissue. There is a small amount of undermining noted no evidence of infection. On the right side of this wound there is some senescent rolled edges which may need debridement I did not do this today. 09/01/15 this is a small wound over the lower sacral area. It has tunneling superiorly. This goes roughly 1 cm. Nonviable subcutaneous tissue at the inferior aspect of the wound base. No real evidence of infection 09/07/15; small horizontal wound over the lower sacral area. Last week it had 1 cm of superior tunneling which appears to of resolved. No evidence of infection 09/21/15; continues to have a small horizontal wound over the lower sacral area. We have been using Prisma and this has contracted. He tells me that his mother is aware medication not able to change the dressing although he states that he is able to do this 09/28/15; a small horizontal wound over his lower sacral area is all that is left of this wound which was probably much larger and deeper at one point. Depth today at 0.7 cm. Base of this had some nonviable subcutaneous tissue that I removed. Appears to be stalling on current dressing which is Prisma 10/19/15; I have not seen this patient in several weeks. He arrives today with a linear/horizontal  wound across his lower sacral area. Considerable depth of this wound. Extensive debridement done 11/17/15; patient arrives for two-week visit. He is not doing well wound is bigger with odor and drainage. Has been using Aquacel Ag. I have not seen this in over a month although I think he was seen by the physician covering me 2 weeks ago on 11/05/15 11/24/15; I brought this man back this week to review the wound after last week's deterioration. Culture grew Enterobacter and some staph aureus [MSSA]. I had ordered him Cipro but we don't seem to of been able to communicate with the patient therefore we've called in today. He is not been systemically unwell. Still using Aquacel 12/15/15; very little change in this wound since the last time I saw it. Horizontal wound over the lower sacral Follansbee, Hasson ([id number]) area probably about 2 cm in width and 0.8 cm in depth. There is no evidence of infection. There appears to be callus around this. In spite of the patient's best efforts I think there is an adequate pressure relief here. No current evidence of infection 12/22/15; horizontal pressure ulcer over the lower sacral area. Appears to be filling in and has better dimensions in appearance today. Still circumferential callus which would suggest inadequate offloading although everything else appears to be improved 12/29/15 horizontal pressure wound over the lower sacral area. Appears to be filling in however skin groin down into the divot. No evidence of infection 01/05/16; comes in today with not much change in this wound. There is surrounding callus indicative of pressure. Not much change in the wound bed. This clearly require debridement today we have been using Hydrofera Blue 02/04/16: returns today after an absence. no systemic s/s of infection. once again, periwound callus is present suggesting pressure. Electronic Signature(s) Signed: 02/04/2016 5:31:30 PM By: Georges Lynch FNP Entered By:  Georges Lynch on 02/04/2016 15:00:23 [name] ([id number]) -------------------------------------------------------------------------------- Physician Orders Details Patient  Name: AVELARDO, REESMAN Date of Service: 02/04/2016 2:15 PM Medical Record Number: [id number] Patient Account Number: [id number] Date of Birth/Sex: February 25, 1971 (45 y.o. Male) Treating RN: Clover Mealy, RN, BSN, [place] Sink Primary Care Physician: PATIENT, NO Other Clinician: Referring Physician: Treating Physician/Extender: Eugene Garnet in Treatment: 24 Verbal / Phone Orders: Yes Clinician: Afful, RN, BSN, Rita Read Back and Verified: Yes Diagnosis Coding Wound Cleansing Wound #1 Left Coccyx o Clean wound with Normal Saline. Anesthetic Wound #1 Left Coccyx o Topical Lidocaine 4% cream applied to wound bed prior to debridement Skin Barriers/Peri-Wound Care Wound #1 Left Coccyx o Skin Prep Primary Wound Dressing o Aquacel Ag Secondary Dressing Wound #1 Left Coccyx o Foam o Tegaderm Dressing Change Frequency Wound #1 Left Coccyx o Change dressing every day. Follow-up Appointments Wound #1 Left Coccyx o Return Appointment in 2 weeks. Off-Loading Wound #1 Left Coccyx o Turn and reposition every 2 hours Additional Orders / Instructions Wound #1 Left Coccyx o Increase protein intake. AYINDE, SWIM ([id number]) Medications-please add to medication list. Wound #1 Left Coccyx o Other: - Vitamin C, A and Zinc Electronic Signature(s) Signed: 02/04/2016 4:35:15 PM By: Elpidio Eric BSN, RN Signed: 02/04/2016 5:31:30 PM By: Georges Lynch FNP Entered By: Elpidio Eric on 02/04/2016 14:53:47 [name] ([id number]) -------------------------------------------------------------------------------- Problem List Details Patient Name: [name] Date of Service: 02/04/2016 2:15 PM Medical Record Number: [id number] Patient Account Number: [id number] Date of Birth/Sex: 25-Nov-1970 (45  y.o. Male) Treating RN: Clover Mealy, RN, BSN, Rita Primary Care Physician: PATIENT, NO Other Clinician: Referring Physician: Treating Physician/Extender: Eugene Garnet in Treatment: 24 Active Problems ICD-10 Encounter Code Description Active Date Diagnosis L89.153 Pressure ulcer of sacral region, stage 3 08/17/2015 Yes G82.22 Paraplegia, incomplete 08/17/2015 Yes Inactive Problems Resolved Problems Electronic Signature(s) Signed: 02/04/2016 5:31:30 PM By: Georges Lynch FNP Entered By: Georges Lynch on 02/04/2016 14:58:47 [name] ([id number]) -------------------------------------------------------------------------------- Progress Note Details Patient Name: [name] Date of Service: 02/04/2016 2:15 PM Medical Record Number: [id number] Patient Account Number: [id number] Date of Birth/Sex: Jun 06, 1970 (45 y.o. Male) Treating RN: Clover Mealy, RN, BSN, Rita Primary Care Physician: PATIENT, NO Other Clinician: Referring Physician: Treating Physician/Extender: Eugene Garnet in Treatment: 24 Subjective Chief Complaint Information obtained from Patient 08/17/15; the patient is recently really relocated from [facility]. He is here to establish a wound care for a stage III ulcer on his lower sacral area. 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 He was diagnosed with what sounds like a stage III pressure ulcer in September 2016 although this may have been present for some time longer than that perhaps July 2016. He was followed by wound care in [place]. By his description there were using Silvadene cream for a period of time and then 3 months ago change to Aquacel Ag which she is continued and changes daily with help from his mother. He has recently relocated to [facility]. He went to urgent care to follow- up on his  wound on 08/04/2015 they did a culture of this wound that showed Pseudomonas and MSSA. He was placed on an antibiotic but doesn't remember which one it is. He has continued using Aquacel Ag and a foam based cover. I don't have any of his records from the wound care center in [place] although the patient states he did have an MRI that did not show osteomyelitis. He is not aware of having a bone culture  done and doesn't think that he had exposed bone on this and any point. The patient does in and out cath's and has no urinary incontinence issues. He is on appropriate bowel regimen which she administers suppositories. He is able to position himself appropriately and is wheelchair to try and avoid pressure over this wound 08/24/15; small horizontal wound over his lower sacral area. Requires debridement of surface slough and nonviable subcutaneous tissue. There is a small amount of undermining noted no evidence of infection. On the right side of this wound there is some senescent rolled edges which may need debridement I did not do this today. 09/01/15 this is a small wound over the lower sacral area. It has tunneling superiorly. This goes roughly 1 cm. Nonviable subcutaneous tissue at the inferior aspect of the wound base. No real evidence of infection 09/07/15; small horizontal wound over the lower sacral area. Last week it had 1 cm of superior tunneling which appears to of resolved. No evidence of infection 09/21/15; continues to have a small horizontal wound over the lower sacral area. We have been using Prisma and this has contracted. He tells me that his mother is aware medication not able to change the dressing although he states that he is able to do this 09/28/15; a small horizontal wound over his lower sacral area is all that is left of this wound which was probably much larger and deeper at one point. Depth today at 0.7 cm. Base of this had some nonviable subcutaneous tissue that I removed.  Appears to be stalling on current dressing which is Prisma 10/19/15; I have not seen this patient in several weeks. He arrives today with a linear/horizontal wound across his lower sacral area. Considerable depth of this wound. Extensive debridement done 11/17/15; patient arrives for two-week visit. He is not doing well wound is bigger with odor and drainage. Has ZARIF, RATHJE ([id number]) been using Aquacel Ag. I have not seen this in over a month although I think he was seen by the physician covering me 2 weeks ago on 11/05/15 11/24/15; I brought this man back this week to review the wound after last week's deterioration. Culture grew Enterobacter and some staph aureus [MSSA]. I had ordered him Cipro but we don't seem to of been able to communicate with the patient therefore we've called in today. He is not been systemically unwell. Still using Aquacel 12/15/15; very little change in this wound since the last time I saw it. Horizontal wound over the lower sacral area probably about 2 cm in width and 0.8 cm in depth. There is no evidence of infection. There appears to be callus around this. In spite of the patient's best efforts I think there is an adequate pressure relief here. No current evidence of infection 12/22/15; horizontal pressure ulcer over the lower sacral area. Appears to be filling in and has better dimensions in appearance today. Still circumferential callus which would suggest inadequate offloading although everything else appears to be improved 12/29/15 horizontal pressure wound over the lower sacral area. Appears to be filling in however skin groin down into the divot. No evidence of infection 01/05/16; comes in today with not much change in this wound. There is surrounding callus indicative of pressure. Not much change in the wound bed. This clearly require debridement today we have been using Hydrofera Blue 02/04/16: returns today after an absence. no systemic s/s of infection.  once again, periwound callus is present suggesting pressure. Objective Constitutional Vitals Time Taken: 2:23 PM,  Height: 67 in, Weight: 147 lbs, BMI: 23, Temperature: 97.8 F, Pulse: 88 bpm, Respiratory Rate: 16 breaths/min, Blood Pressure: 130/79 mmHg. Integumentary (Hair, Skin) Wound #1 status is Open. Original cause of wound was Pressure Injury. The wound is located on the Left Coccyx. The wound measures 2cm length x 1cm width x 0.5cm depth; 1.571cm^2 area and 0.785cm^3 volume. There is fat exposed. There is no tunneling or undermining noted. There is a large amount of serous drainage noted. The wound margin is thickened. There is medium (34-66%) pink granulation within the wound bed. There is a medium (34-66%) amount of necrotic tissue within the wound bed including Adherent Slough. The periwound skin appearance exhibited: Callus, Maceration, Moist. The periwound skin appearance did not exhibit: Dry/Scaly. Periwound temperature was noted as No Abnormality. Assessment Active Problems ICD-10 EMMONS, TOTH ([id number]) [id number] - Pressure ulcer of sacral region, stage 3 G82.22 - Paraplegia, incomplete Procedures Wound #1 Wound #1 is a Pressure Ulcer located on the Left Coccyx . There was a Skin/Subcutaneous Tissue/Muscle Debridement ([id number]) debridement with total area of 2 sq cm performed by Ardath Sax, MD. with the following instrument(s): Curette to remove Non-Viable tissue/material including Fibrin/Slough, Callus, and Subcutaneous after achieving pain control using Lidocaine 4% Topical Solution. A time out was conducted at 14:30, prior to the start of the procedure. A Minimum amount of bleeding was controlled with Pressure. The procedure was tolerated well with a pain level of Insensate throughout and a pain level of Insensate following the procedure. Post Debridement Measurements: 2cm length x 1cm width x 0.5cm depth; 0.785cm^3 volume. Post debridement Stage noted as  Category/Stage III. Character of Wound/Ulcer Post Debridement requires further debridement. Severity of Tissue Post Debridement is: Fat layer exposed. Post procedure Diagnosis Wound #1: Same as Pre-Procedure Plan Wound Cleansing: Wound #1 Left Coccyx: Clean wound with Normal Saline. Anesthetic: Wound #1 Left Coccyx: Topical Lidocaine 4% cream applied to wound bed prior to debridement Skin Barriers/Peri-Wound Care: Wound #1 Left Coccyx: Skin Prep Primary Wound Dressing: Aquacel Ag Secondary Dressing: Wound #1 Left Coccyx: Foam Tegaderm Dressing Change Frequency: Wound #1 Left Coccyx: Change dressing every day. Follow-up Appointments: Wound #1 Left Coccyx: KEYTON, BHAT ([id number]) Return Appointment in 2 weeks. Off-Loading: Wound #1 Left Coccyx: Turn and reposition every 2 hours Additional Orders / Instructions: Wound #1 Left Coccyx: Increase protein intake. Medications-please add to medication list.: Wound #1 Left Coccyx: Other: - Vitamin C, A and Zinc Follow-Up Appointments: A Patient Clinical Summary of Care was provided to [facility] 1. discussed clinical findings and implications with pt. all questions were answered. 2. counseled regarding the need for strict adherence to pressure offloading. Electronic Signature(s) Signed: 02/04/2016 5:31:30 PM By: Georges Lynch FNP Entered By: Georges Lynch on 02/04/2016 15:01:17 [name] ([id number]) -------------------------------------------------------------------------------- SuperBill Details Patient Name: [name] Date of Service: 02/04/2016 Medical Record Number: [id number] Patient Account Number: [id number] Date of Birth/Sex: 1970-08-22 (45 y.o. Male) Treating RN: Clover Mealy, RN, BSN, [place] Sink Primary Care Physician: PATIENT, NO Other Clinician: Referring Physician: Treating Physician/Extender: Eugene Garnet in Treatment: 24 Diagnosis Coding ICD-10 Codes Code Description L89.153 Pressure ulcer of  sacral region, stage 3 G82.22 Paraplegia, incomplete Facility Procedures CPT4 Code: [id number] Description: 11043 - DEB MUSC/FASCIA 20 SQ CM/< ICD-10 Description Diagnosis L89.153 Pressure ulcer of sacral region, stage 3 Modifier: Quantity: 1 Physician Procedures CPT4 Code: [id number] Description: 11043 - WC PHYS DEBR MUSCLE/FASCIA 20 SQ CM ICD-10 Description Diagnosis L89.153 Pressure ulcer of sacral region, stage 3 Modifier: Quantity: 1 Electronic Signature(s) Signed: 02/04/2016 5:31:30 PM  By: Georges Lynch FNP Entered By: Georges Lynch on 02/04/2016 15:01:26

## 2016-02-18 ENCOUNTER — Encounter: Payer: Medicare Other | Admitting: Surgery

## 2016-02-18 ENCOUNTER — Ambulatory Visit (INDEPENDENT_AMBULATORY_CARE_PROVIDER_SITE_OTHER): Payer: Medicare Other | Admitting: Psychology

## 2016-02-18 DIAGNOSIS — F17210 Nicotine dependence, cigarettes, uncomplicated: Secondary | ICD-10-CM | POA: Diagnosis not present

## 2016-02-18 DIAGNOSIS — L89322 Pressure ulcer of left buttock, stage 2: Secondary | ICD-10-CM | POA: Diagnosis not present

## 2016-02-18 DIAGNOSIS — F3132 Bipolar disorder, current episode depressed, moderate: Secondary | ICD-10-CM | POA: Diagnosis not present

## 2016-02-18 DIAGNOSIS — Z8249 Family history of ischemic heart disease and other diseases of the circulatory system: Secondary | ICD-10-CM | POA: Diagnosis not present

## 2016-02-18 DIAGNOSIS — G8222 Paraplegia, incomplete: Secondary | ICD-10-CM | POA: Diagnosis not present

## 2016-02-18 DIAGNOSIS — L89153 Pressure ulcer of sacral region, stage 3: Secondary | ICD-10-CM | POA: Diagnosis not present

## 2016-02-19 NOTE — Progress Notes (Signed)
[name], [name] ([id number]) Visit Report for 02/18/2016 Arrival Information Details Patient Name: [name], [name] Date of Service: 02/18/2016 1:30 PM Medical Record Number: [id number] Patient Account Number: [id number] Date of Birth/Sex: 1971-03-20 [age] y.o. Male) Treating RN: Phillis HaggisPinkerton, Debi Primary Care Physician: PATIENT, NO Other Clinician: Referring Physician: Treating Physician/Extender: Rudene ReBritto, Errol Weeks in Treatment: 26 Visit Information History Since Last Visit All ordered tests and consults were completed: No Patient Arrived: Wheel Chair Added or deleted any medications: No Arrival Time: 13:47 Any new allergies or adverse reactions: No Accompanied By: self Had a fall or experienced change in No activities of daily living that may affect Transfer Assistance: Other risk of falls: Patient Identification Verified: Yes Signs or symptoms of abuse/neglect since last No Secondary Verification Process Yes visito Completed: Hospitalized since last visit: No Patient Requires Transmission-Based No Pain Present Now: No Precautions: Patient Has Alerts: No Electronic Signature(s) Signed: 02/18/2016 5:06:12 PM By: Alejandro MullingPinkerton, Debra Entered By: Alejandro MullingPinkerton, Debra on 02/18/2016 13:47:35 [name], [name] ([id number]) -------------------------------------------------------------------------------- Encounter Discharge Information Details Patient Name: [name], [name] Date of Service: 02/18/2016 1:30 PM Medical Record Number: [id number] Patient Account Number: [id number] Date of Birth/Sex: 1971-03-20 [age] y.o. Male) Treating RN: Phillis HaggisPinkerton, Debi Primary Care Physician: PATIENT, NO Other Clinician: Referring Physician: Treating Physician/Extender: Rudene ReBritto, Errol Weeks in Treatment: 26 Encounter Discharge Information Items Discharge Pain Level: 0 Discharge Condition: Stable Ambulatory Status: Wheelchair Discharge Destination: Home Transportation: Private Auto Accompanied By:  self Schedule Follow-up Appointment: Yes Medication Reconciliation completed and provided to Patient/Care Yes [place]: Provided on Clinical Summary of Care: 02/18/2016 Form Type Recipient Paper [facility] Signature(s) Signed: 02/18/2016 2:30:43 PM By: Gwenlyn PerkingMoore, Shelia Entered By: Gwenlyn PerkingMoore, Shelia on 02/18/2016 14:30:43 [name], [name] ([id number]) -------------------------------------------------------------------------------- Lower Extremity Assessment Details Patient Name: [name], [name] Date of Service: 02/18/2016 1:30 PM Medical Record Number: [id number] Patient Account Number: [id number] Date of Birth/Sex: 1971-03-20 [age] y.o. Male) Treating RN: Phillis HaggisPinkerton, Debi Primary Care Physician: PATIENT, NO Other Clinician: Referring Physician: Treating Physician/Extender: Rudene ReBritto, Errol Weeks in Treatment: 26 Electronic Signature(s) Signed: 02/18/2016 5:06:12 PM By: Alejandro MullingPinkerton, Debra Entered By: Alejandro MullingPinkerton, Debra on 02/18/2016 13:50:20 [name], [name] ([id number]) -------------------------------------------------------------------------------- Multi Wound Chart Details Patient Name: [name], [name] Date of Service: 02/18/2016 1:30 PM Medical Record Number: [id number] Patient Account Number: [id number] Date of Birth/Sex: 1971-03-20 [age] y.o. Male) Treating RN: Phillis HaggisPinkerton, Debi Primary Care Physician: PATIENT, NO Other Clinician: Referring Physician: Treating Physician/Extender: Rudene ReBritto, Errol Weeks in Treatment: 26 Vital Signs Height(in): 67 Pulse(bpm): 100 Weight(lbs): 147 Blood Pressure 108/71 (mmHg): Body Mass Index(BMI): 23 Temperature(F): 98.0 Respiratory Rate 18 (breaths/min): Photos: [1:No Photos] [2:No Photos] [N/A:N/A] Wound Location: [1:Left Coccyx] [2:Left Gluteus] [N/A:N/A] Wounding Event: [1:Pressure Injury] [2:Shear/Friction] [N/A:N/A] Primary Etiology: [1:Pressure Ulcer] [2:Skin Tear] [N/A:N/A] Comorbid History: [1:Paraplegia] [2:Paraplegia]  [N/A:N/A] Date Acquired: [1:12/24/2014] [2:02/11/2016] [N/A:N/A] Weeks of Treatment: [1:26] [2:0] [N/A:N/A] Wound Status: [1:Open] [2:Open] [N/A:N/A] Measurements L x W x D 4x1.1x0.3 [2:0.5x0.5x0.1] [N/A:N/A] (cm) Area (cm) : [1:3.456] [2:0.196] [N/A:N/A] Volume (cm) : [1:1.037] [2:0.02] [N/A:N/A] % Reduction in Area: [1:-226.00%] [2:0.00%] [N/A:N/A] % Reduction in Volume: -95.70% [2:0.00%] [N/A:N/A] Classification: [1:Category/Stage III] [2:Partial Thickness] [N/A:N/A] Exudate Amount: [1:Large] [2:Large] [N/A:N/A] Exudate Type: [1:Serous] [2:Serous] [N/A:N/A] Exudate Color: [1:amber] [2:amber] [N/A:N/A] Foul Odor After [1:Yes] [2:No] [N/A:N/A] Cleansing: Odor Anticipated Due to No [2:N/A] [N/A:N/A] Product Use: Wound Margin: [1:Thickened] [2:Flat and Intact] [N/A:N/A] Granulation Amount: [1:Medium (34-66%)] [2:Medium (34-66%)] [N/A:N/A] Granulation Quality: [1:Pink] [2:Red, Pink] [N/A:N/A] Necrotic Amount: [1:Medium (34-66%)] [2:Medium (34-66%)] [N/A:N/A] Necrotic Tissue: [1:Eschar, Adherent Slough] [2:Adherent Slough] [N/A:N/A] Exposed Structures: [1:Fat: Yes Fascia: No] [2:Fascia: No Fat: No] [N/A:N/A] Tendon:  No Tendon: No Muscle: No Muscle: No Joint: No Joint: No Bone: No Bone: No Limited to Skin Breakdown Epithelialization: None None N/A Periwound Skin Texture: Callus: Yes No Abnormalities Noted N/A Periwound Skin Maceration: Yes Moist: Yes N/A Moisture: Moist: Yes Dry/Scaly: No Periwound Skin Color: No Abnormalities Noted No Abnormalities Noted N/A Temperature: No Abnormality No Abnormality N/A Tenderness on No Yes N/A Palpation: Wound Preparation: Ulcer Cleansing: Ulcer Cleansing: N/A Rinsed/Irrigated with Rinsed/Irrigated with Saline Saline Topical Anesthetic Topical Anesthetic Applied: Other: lidocaine Applied: Other: lodocaine 4% 4% Treatment Notes Electronic Signature(s) Signed: 02/18/2016 5:06:12 PM By: Alejandro Mulling Entered By: Alejandro Mulling on 02/18/2016 14:01:42 Matthew Evangelist ([id number]) -------------------------------------------------------------------------------- Multi-Disciplinary Care Plan Details Patient Name: Matthew Evangelist Date of Service: 02/18/2016 1:30 PM Medical Record Number: [id number] Patient Account Number: [id number] Date of Birth/Sex: 05/30/1970 (45 y.o. Male) Treating RN: Phillis Haggis Primary Care Physician: PATIENT, NO Other Clinician: Referring Physician: Treating Physician/Extender: Rudene Re in Treatment: 61 Active Inactive Abuse / Safety / Falls / Self Care Management Nursing Diagnoses: Potential for falls Goals: Patient will remain injury free Date Initiated: 08/17/2015 Goal Status: Active Interventions: Assess fall risk on admission and as needed Notes: Nutrition Nursing Diagnoses: Imbalanced nutrition Goals: Patient/caregiver agrees to and verbalizes understanding of need to use nutritional supplements and/or vitamins as prescribed Date Initiated: 08/17/2015 Goal Status: Active Interventions: Assess patient nutrition upon admission and as needed per policy Notes: Orientation to the Wound Care Program Nursing Diagnoses: Knowledge deficit related to the wound healing center program Goals: Patient/caregiver will verbalize understanding of the Wound Healing Center Program ELMUS, MATHES ([id number]) Date Initiated: 08/17/2015 Goal Status: Active Interventions: Provide education on orientation to the wound center Notes: Pressure Nursing Diagnoses: Knowledge deficit related to causes and risk factors for pressure ulcer development Goals: Patient will remain free from development of additional pressure ulcers Date Initiated: 08/17/2015 Goal Status: Active Interventions: Assess: immobility, friction, shearing, incontinence upon admission and as needed Notes: Wound/Skin Impairment Nursing Diagnoses: Impaired tissue integrity Goals: Ulcer/skin breakdown will  have a volume reduction of 30% by week 4 Date Initiated: 08/17/2015 Goal Status: Active Ulcer/skin breakdown will have a volume reduction of 50% by week 8 Date Initiated: 08/17/2015 Goal Status: Active Ulcer/skin breakdown will have a volume reduction of 80% by week 12 Date Initiated: 08/17/2015 Goal Status: Active Interventions: Assess patient/caregiver ability to obtain necessary supplies Assess ulceration(s) every visit Notes: Electronic Signature(s) Signed: 02/18/2016 5:06:12 PM By: Servando Snare, Leonette Most ([id number]) Entered By: Alejandro Mulling on 02/18/2016 14:01:36 Matthew Evangelist ([id number]) -------------------------------------------------------------------------------- Pain Assessment Details Patient Name: Matthew Evangelist Date of Service: 02/18/2016 1:30 PM Medical Record Number: [id number] Patient Account Number: [id number] Date of Birth/Sex: [birth year] (45 y.o. Male) Treating RN: Phillis Haggis Primary Care Physician: PATIENT, NO Other Clinician: Referring Physician: Treating Physician/Extender: Rudene Re in Treatment: 26 Active Problems Location of Pain Severity and Description of Pain Patient Has Paino No Site Locations With Dressing Change: No Pain Management and Medication Current Pain Management: Electronic Signature(s) Signed: 02/18/2016 5:06:12 PM By: Alejandro Mulling Entered By: Alejandro Mulling on 02/18/2016 13:47:40 Matthew Evangelist ([id number]) -------------------------------------------------------------------------------- Patient/Caregiver Education Details Patient Name: Matthew Evangelist Date of Service: 02/18/2016 1:30 PM Medical Record Number: [id number] Patient Account Number: [id number] Date of Birth/Gender: 04-20-1971 (45 y.o. Male) Treating RN: Phillis Haggis Primary Care Physician: PATIENT, NO Other Clinician: Referring Physician: Treating Physician/Extender: Rudene Re in Treatment: 41 Education  Assessment Education Provided To: Patient Education Topics Provided Wound/Skin Impairment: Handouts: Other: change dressing as ordered Methods: Demonstration, Explain/Verbal Responses: State  content correctly Electronic Signature(s) Signed: 02/18/2016 5:06:12 PM By: Alejandro MullingPinkerton, Debra Entered By: Alejandro MullingPinkerton, Debra on 02/18/2016 14:03:20 [name], [name] ([id number]) -------------------------------------------------------------------------------- Wound Assessment Details Patient Name: [name], [name] Date of Service: 02/18/2016 1:30 PM Medical Record Number: [id number] Patient Account Number: [id number] Date of Birth/Sex: [birth year] (45 y.o. Male) Treating RN: Phillis HaggisPinkerton, Debi Primary Care Physician: PATIENT, NO Other Clinician: Referring Physician: Treating Physician/Extender: Rudene ReBritto, Errol Weeks in Treatment: 26 Wound Status Wound Number: 1 Primary Etiology: Pressure Ulcer Wound Location: Left Coccyx Wound Status: Open Wounding Event: Pressure Injury Comorbid History: Paraplegia Date Acquired: 12/24/2014 Weeks Of Treatment: 26 Clustered Wound: No Photos Photo Uploaded By: Alejandro MullingPinkerton, Debra on 02/18/2016 17:04:40 Wound Measurements Length: (cm) 4 Width: (cm) 1.1 Depth: (cm) 0.3 Area: (cm) 3.456 Volume: (cm) 1.037 % Reduction in Area: -226% % Reduction in Volume: -95.7% Epithelialization: None Tunneling: No Undermining: No Wound Description Classification: Category/Stage III Foul Odor Aft Wound Margin: Thickened Due to Produc Exudate Amount: Large Exudate Type: Serous Exudate Color: amber er Cleansing: Yes t Use: No Wound Bed Granulation Amount: Medium (34-66%) Exposed Structure Granulation Quality: Pink Fascia Exposed: No Necrotic Amount: Medium (34-66%) Fat Layer Exposed: Yes Necrotic Quality: Eschar, Adherent Slough Tendon Exposed: No [name], [name] ([id number]) Muscle Exposed: No Joint Exposed: No Bone Exposed: No Periwound Skin Texture Texture  Color No Abnormalities Noted: No No Abnormalities Noted: No Callus: Yes Temperature / Pain Moisture Temperature: No Abnormality No Abnormalities Noted: No Dry / Scaly: No Maceration: Yes Moist: Yes Wound Preparation Ulcer Cleansing: Rinsed/Irrigated with Saline Topical Anesthetic Applied: Other: lidocaine 4%, Treatment Notes Wound #1 (Left Coccyx) 1. Cleansed with: Clean wound with Normal Saline 2. Anesthetic Topical Lidocaine 4% cream to wound bed prior to debridement 3. Peri-wound Care: Skin Prep 4. Dressing Applied: Prisma Ag 5. Secondary Dressing Applied Foam Tegaderm Electronic Signature(s) Signed: 02/18/2016 5:06:12 PM By: Alejandro MullingPinkerton, Debra Entered By: Alejandro MullingPinkerton, Debra on 02/18/2016 13:57:48 [name], [name] ([id number]) -------------------------------------------------------------------------------- Wound Assessment Details Patient Name: [name], [name] Date of Service: 02/18/2016 1:30 PM Medical Record Number: [id number] Patient Account Number: [id number] Date of Birth/Sex: [birth year] [age] y.o. Male) Treating RN: Phillis HaggisPinkerton, Debi Primary Care Physician: PATIENT, NO Other Clinician: Referring Physician: Treating Physician/Extender: Rudene ReBritto, Errol Weeks in Treatment: 26 Wound Status Wound Number: 2 Primary Etiology: Skin Tear Wound Location: Left Gluteus Wound Status: Open Wounding Event: Shear/Friction Comorbid History: Paraplegia Date Acquired: 02/11/2016 Weeks Of Treatment: 0 Clustered Wound: No Photos Photo Uploaded By: Alejandro MullingPinkerton, Debra on 02/18/2016 17:05:00 Wound Measurements Length: (cm) 0.5 Width: (cm) 0.5 Depth: (cm) 0.1 Area: (cm) 0.196 Volume: (cm) 0.02 % Reduction in Area: 0% % Reduction in Volume: 0% Epithelialization: None Tunneling: No Undermining: No Wound Description Classification: Partial Thickness Wound Margin: Flat and Intact Exudate Amount: Large Exudate Type: Serous Exudate Color: amber Foul Odor After Cleansing:  No Wound Bed Granulation Amount: Medium (34-66%) Exposed Structure Granulation Quality: Red, Pink Fascia Exposed: No Necrotic Amount: Medium (34-66%) Fat Layer Exposed: No Necrotic Quality: Adherent Slough Tendon Exposed: No [name], [name] ([id number]) Muscle Exposed: No Joint Exposed: No Bone Exposed: No Limited to Skin Breakdown Periwound Skin Texture Texture Color No Abnormalities Noted: No No Abnormalities Noted: No Moisture Temperature / Pain No Abnormalities Noted: No Temperature: No Abnormality Moist: Yes Tenderness on Palpation: Yes Wound Preparation Ulcer Cleansing: Rinsed/Irrigated with Saline Topical Anesthetic Applied: Other: lodocaine 4%, Treatment Notes Wound #2 (Left Gluteus) 1. Cleansed with: Clean wound with Normal Saline 2. Anesthetic Topical Lidocaine 4% cream to wound bed prior to debridement 3. Peri-wound Care: Skin Prep 4. Dressing Applied: Foam 5.  Secondary Dressing Applied Tegaderm Electronic Signature(s) Signed: 02/18/2016 5:06:12 PM By: Alejandro Mulling Entered By: Alejandro Mulling on 02/18/2016 14:01:28 LEONEL, MCCOLLUM ([id number]) -------------------------------------------------------------------------------- Vitals Details Patient Name: Matthew Evangelist Date of Service: 02/18/2016 1:30 PM Medical Record Number: [id number] Patient Account Number: [id number] Date of Birth/Sex: September 29, 1970 (45 y.o. Male) Treating RN: Phillis Haggis Primary Care Physician: PATIENT, NO Other Clinician: Referring Physician: Treating Physician/Extender: Rudene Re in Treatment: 26 Vital Signs Time Taken: 13:47 Temperature (F): 98.0 Height (in): 67 Pulse (bpm): 100 Weight (lbs): 147 Respiratory Rate (breaths/min): 18 Body Mass Index (BMI): 23 Blood Pressure (mmHg): 108/71 Reference Range: 80 - 120 mg / dl Electronic Signature(s) Signed: 02/18/2016 5:06:12 PM By: Alejandro Mulling Entered By: Alejandro Mulling on 02/18/2016 13:49:26

## 2016-02-19 NOTE — Progress Notes (Signed)
RAYMUNDO, ROUT ([id number]) Visit Report for 02/18/2016 Chief Complaint Document Details Patient Name: [name], [name] Date of Service: 02/18/2016 1:30 PM Medical Record Number: [id number] Patient Account Number: [id number] Date of Birth/Sex: 15-Feb-1971 (45 y.o. Male) Treating RN: Phillis Haggis Primary Care Physician: PATIENT, NO Other Clinician: Referring Physician: Treating Physician/Extender: Rudene Re in Treatment: 26 Information Obtained from: Patient Chief Complaint 08/17/15; the patient is recently really relocated from [facility]. He is here to establish a wound care for a stage III ulcer on his lower sacral area. Electronic Signature(s) Signed: 02/18/2016 2:47:32 PM By: Evlyn Kanner MD, FACS Entered By: Evlyn Kanner on 02/18/2016 14:47:32 [name], [name] ([id number]) -------------------------------------------------------------------------------- Debridement Details Patient Name: [name] Date of Service: 02/18/2016 1:30 PM Medical Record Number: [id number] Patient Account Number: [id number] Date of Birth/Sex: 1970-09-26 (45 y.o. Male) Treating RN: Phillis Haggis Primary Care Physician: PATIENT, NO Other Clinician: Referring Physician: Treating Physician/Extender: Rudene Re in Treatment: 26 Debridement Performed for Wound #1 Left Coccyx Assessment: Performed By: Physician Evlyn Kanner, MD Debridement: Debridement Pre-procedure Yes - 14:07 Verification/Time Out Taken: Start Time: 14:08 Pain Control: Lidocaine 4% Topical Solution Level: Skin/Subcutaneous Tissue Total Area Debrided (L x 4 (cm) x 1.1 (cm) = 4.4 (cm) W): Tissue and other Viable, Non-Viable, Exudate, Fibrin/Slough, Subcutaneous material debrided: Instrument: Curette Bleeding: Minimum Hemostasis Achieved: Pressure End Time: 14:14 Procedural Pain: 0 Post Procedural Pain: 0 Response to Treatment: Procedure was tolerated well Post Debridement Measurements  of Total Wound Length: (cm) 4 Stage: Category/Stage III Width: (cm) 1.1 Depth: (cm) 0.3 Volume: (cm) 1.037 Character of Wound/Ulcer Post Requires Further Debridement: Debridement Severity of Tissue Post Fat layer exposed Debridement: Post Procedure Diagnosis Same as Pre-procedure Electronic Signature(s) Signed: 02/18/2016 2:46:06 PM By: Evlyn Kanner MD, FACS Signed: 02/18/2016 5:06:12 PM By: Alejandro Mulling Entered By: Evlyn Kanner on 02/18/2016 14:46:06 [name], [name] ([id number]) [name], [name] ([id number]) -------------------------------------------------------------------------------- HPI Details Patient Name: [name] Date of Service: 02/18/2016 1:30 PM Medical Record Number: [id number] Patient Account Number: [id number] Date of Birth/Sex: 1970/09/27 (45 y.o. 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 He was diagnosed with what sounds like a stage III pressure ulcer in September 2016 although this may have been present for some time longer than that perhaps July 2016. He was followed by wound care in [place]. By his description there were using Silvadene cream for a period of time and then 3 months ago change to Aquacel Ag which she is continued and changes daily with help from his mother. He has recently relocated to [facility]. He went to urgent care to follow-up on his wound on 08/04/2015 they did a culture of this wound that showed Pseudomonas and MSSA. He was placed on an antibiotic but doesn't remember which one it is. He has continued using Aquacel Ag and a foam based cover. I don't have any of his records from the wound care center  in [place] although the patient states he did have an MRI that did not show osteomyelitis. He is not aware of having a bone culture done and doesn't think that he had exposed bone on this and any point. The patient does in and out cath's and has no urinary incontinence issues. He is on appropriate bowel regimen which she administers  suppositories. He is able to position himself appropriately and is wheelchair to try and avoid pressure over this wound 08/24/15; small horizontal wound over his lower sacral area. Requires debridement of surface slough and nonviable subcutaneous tissue. There is a small amount of undermining noted no evidence of infection. On the right side of this wound there is some senescent rolled edges which may need debridement I did not do this today. 09/01/15 this is a small wound over the lower sacral area. It has tunneling superiorly. This goes roughly 1 cm. Nonviable subcutaneous tissue at the inferior aspect of the wound base. No real evidence of infection 09/07/15; small horizontal wound over the lower sacral area. Last week it had 1 cm of superior tunneling which appears to of resolved. No evidence of infection 09/21/15; continues to have a small horizontal wound over the lower sacral area. We have been using Prisma and this has contracted. He tells me that his mother is aware medication not able to change the dressing although he states that he is able to do this 09/28/15; a small horizontal wound over his lower sacral area is all that is left of this wound which was probably much larger and deeper at one point. Depth today at 0.7 cm. Base of this had some nonviable subcutaneous tissue that I removed. Appears to be stalling on current dressing which is Prisma 10/19/15; I have not seen this patient in several weeks. He arrives today with a linear/horizontal wound across his lower sacral area. Considerable depth of this wound. Extensive debridement done 11/17/15; patient  arrives for two-week visit. He is not doing well wound is bigger with odor and drainage. Has been using Aquacel Ag. I have not seen this in over a month although I think he was seen by the physician covering me 2 weeks ago on 11/05/15 11/24/15; I brought this man back this week to review the wound after last week's deterioration. Culture grew Enterobacter and some staph aureus [MSSA]. I had ordered him Cipro but we don't seem to of been able to communicate with the patient therefore we've called in today. He is not been systemically unwell. Still using Aquacel 12/15/15; very little change in this wound since the last time I saw it. Horizontal wound over the lower sacral Prinsen, Ural ([id number]) area probably about 2 cm in width and 0.8 cm in depth. There is no evidence of infection. There appears to be callus around this. In spite of the patient's best efforts I think there is an adequate pressure relief here. No current evidence of infection 12/22/15; horizontal pressure ulcer over the lower sacral area. Appears to be filling in and has better dimensions in appearance today. Still circumferential callus which would suggest inadequate offloading although everything else appears to be improved 12/29/15 horizontal pressure wound over the lower sacral area. Appears to be filling in however skin groin down into the divot. No evidence of infection 01/05/16; comes in today with not much change in this wound. There is surrounding callus indicative of pressure. Not much change in the wound bed. This clearly require debridement today we have been using Hydrofera Blue 02/04/16: returns today after an absence. no systemic s/s of infection. once again, periwound callus is present suggesting pressure. 02/18/2016 -- she was away for 2 weeks traveling to [place] and during that time he had a new pressure injury to the left ischial tuberosity which he thinks may have been caused while transferring. Electronic  Signature(s) Signed: 02/18/2016 2:48:46 PM By: Meyer Russel,  Glory Buff MD, FACS Entered By: Evlyn Kanner on 02/18/2016 14:48:46 [name], [name] ([id number]) -------------------------------------------------------------------------------- Physical Exam Details Patient Name: [name], [name] Date of Service: 02/18/2016 1:30 PM Medical Record Number: [id number] Patient Account Number: [id number] Date of Birth/Sex: [birth year] (45 y.o. Male) Treating RN: Phillis Haggis Primary Care Physician: PATIENT, NO Other Clinician: Referring Physician: Treating Physician/Extender: Rudene Re in Treatment: 26 Constitutional . Pulse regular. Respirations normal and unlabored. Afebrile. . Eyes Nonicteric. Reactive to light. Ears, Nose, Mouth, and Throat Lips, teeth, and gums WNL.Marland Kitchen Moist mucosa without lesions. Neck supple and nontender. No palpable supraclavicular or cervical adenopathy. Normal sized without goiter. Respiratory WNL. No retractions.. Breath sounds WNL, No rubs, rales, rhonchi, or wheeze.. Cardiovascular Heart rhythm and rate regular, no murmur or gallop.. Pedal Pulses WNL. No clubbing, cyanosis or edema. Chest Breasts symmetical and no nipple discharge.. Breast tissue WNL, no masses, lumps, or tenderness.. Lymphatic No adneopathy. No adenopathy. No adenopathy. Musculoskeletal Adexa without tenderness or enlargement.. Digits and nails w/o clubbing, cyanosis, infection, petechiae, ischemia, or inflammatory conditions.. Integumentary (Hair, Skin) No suspicious lesions. No crepitus or fluctuance. No peri-wound warmth or erythema. No masses.Marland Kitchen Psychiatric Judgement and insight Intact.. No evidence of depression, anxiety, or agitation.. Notes continues to build a lot of callus around the wound and subcutaneous tissue which also has some maceration. I have sharply debrided this with a #3 curet and bleeding controlled with pressure. wound on the left ischial tuberosity is a St 2 pressure  injury and no sharp debridement was required today. Electronic Signature(s) Signed: 02/18/2016 2:50:04 PM By: Evlyn Kanner MD, FACS Entered By: Evlyn Kanner on 02/18/2016 14:50:04 [name] ([id number]) -------------------------------------------------------------------------------- Physician Orders Details Patient Name: [name] Date of Service: 02/18/2016 1:30 PM Medical Record Number: [id number] Patient Account Number: [id number] Date of Birth/Sex: 1971-02-26 (45 y.o. Male) Treating RN: Phillis Haggis Primary Care Physician: PATIENT, NO Other Clinician: Referring Physician: Treating Physician/Extender: Rudene Re in Treatment: 3 Verbal / Phone Orders: Yes ClinicianAshok Cordia, Debi Read Back and Verified: Yes Diagnosis Coding Wound Cleansing Wound #1 Left Coccyx o Clean wound with Normal Saline. Anesthetic Wound #1 Left Coccyx o Topical Lidocaine 4% cream applied to wound bed prior to debridement Skin Barriers/Peri-Wound Care Wound #1 Left Coccyx o Skin Prep Primary Wound Dressing Wound #1 Left Coccyx o Prisma Ag - moisten with saline Wound #2 Left Gluteus o Foam Secondary Dressing Wound #1 Left Coccyx o Foam o Tegaderm Wound #2 Left Gluteus o Tegaderm Dressing Change Frequency Wound #1 Left Coccyx o Change dressing every day. Follow-up Appointments Wound #1 Left Coccyx o Return Appointment in 2 weeks. [name], [name] ([id number]) Off-Loading Wound #1 Left Coccyx o Turn and reposition every 2 hours Additional Orders / Instructions Wound #1 Left Coccyx o Increase protein intake. Medications-please add to medication list. Wound #1 Left Coccyx o Other: - Vitamin C, A and Zinc Electronic Signature(s) Signed: 02/18/2016 4:11:36 PM By: Evlyn Kanner MD, FACS Signed: 02/18/2016 5:06:12 PM By: Alejandro Mulling Entered By: Alejandro Mulling on 02/18/2016 14:15:50 [name], [name]  ([id number]) -------------------------------------------------------------------------------- Problem List Details Patient Name: [name] Date of Service: 02/18/2016 1:30 PM Medical Record Number: [id number] Patient Account Number: [id number] Date of Birth/Sex: 1970/05/13 (45 y.o. Male) Treating RN: Phillis Haggis Primary Care Physician: PATIENT, NO Other Clinician: Referring Physician: Treating Physician/Extender: Rudene Re in Treatment: 26 Active Problems ICD-10 Encounter Code Description Active Date Diagnosis L89.153 Pressure ulcer of sacral region, stage 3 08/17/2015 Yes G82.22 Paraplegia, incomplete 08/17/2015 Yes L89.322 Pressure ulcer of left buttock, stage 2 02/18/2016  Yes Inactive Problems Resolved Problems Electronic Signature(s) Signed: 02/18/2016 2:47:21 PM By: Evlyn KannerBritto, Yamir Carignan MD, FACS Previous Signature: 02/18/2016 2:45:55 PM Version By: Evlyn KannerBritto, Gilberta Peeters MD, FACS Entered By: Evlyn KannerBritto, Yerik Zeringue on 02/18/2016 14:47:21 [name], [name] ([id number]) -------------------------------------------------------------------------------- Progress Note Details Patient Name: [name], [name] Date of Service: 02/18/2016 1:30 PM Medical Record Number: [id number] Patient Account Number: [id number] Date of Birth/Sex: Mar 09, 1971 [age] y.o. Male) Treating RN: Phillis HaggisPinkerton, Debi Primary Care Physician: PATIENT, NO Other Clinician: Referring Physician: Treating Physician/Extender: Rudene ReBritto, Tanith Dagostino Weeks in Treatment: 26 Subjective Chief Complaint Information obtained from Patient 08/17/15; the patient is [place] really [place] He is here to establish a wound care for a stage III ulcer on his lower sacral area. 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 He was diagnosed with what sounds like a stage III pressure ulcer in September 2016 although  this may have been present for some time longer than that perhaps July 2016. [place] By his description there were using Silvadene cream for a period of time and then 3 months ago change to Aquacel Ag which she is continued and changes daily with help from his mother. [place] [facility] [place] He went to urgent care to follow- up on his wound on 08/04/2015 they did a culture of this wound that showed Pseudomonas and MSSA. He was placed on an antibiotic but doesn't remember which one it is. He has continued using Aquacel Ag and a foam based cover. I don't have any of his records from the wound care [place] although the patient states he did have an MRI that did not show osteomyelitis. He is not aware of having a bone culture done and doesn't think that he had exposed bone on this and any point. The patient does in and out cath's and has no urinary incontinence issues. He is on appropriate bowel regimen which she administers suppositories. He is able to position himself appropriately and is wheelchair to try and avoid pressure over this wound 08/24/15; small horizontal wound over his lower sacral area. Requires debridement of surface slough and nonviable subcutaneous tissue. There is a small amount of undermining noted no evidence of infection. On the right side of this wound there is some senescent rolled edges which may need debridement I did not do this today. 09/01/15 this is a small wound over the lower sacral area. It has tunneling superiorly. This goes roughly 1 cm. Nonviable subcutaneous tissue at the inferior aspect of the wound base. No real evidence of infection 09/07/15; small horizontal wound over the lower sacral area. Last week it had 1 cm of superior tunneling which appears to of resolved. No evidence of infection 09/21/15; continues to have a small horizontal wound over the lower sacral area. We have been  using Prisma and this has contracted. He tells me that his mother is aware medication not able to change the dressing although he states that he is able to do this 09/28/15; a small horizontal wound over his lower sacral area is all that is left of this wound which was probably much larger and deeper at one point. Depth today at 0.7 cm. Base of this had some nonviable subcutaneous tissue that I removed. Appears to be stalling on current dressing which is Prisma 10/19/15; I have not seen this patient in several  weeks. He arrives today with a linear/horizontal wound across his lower sacral area. Considerable depth of this wound. Extensive debridement done 11/17/15; patient arrives for two-week visit. He is not doing well wound is bigger with odor and drainage. Has TORRION, WITTER ([id number]) been using Aquacel Ag. I have not seen this in over a month although I think he was seen by the physician covering me 2 weeks ago on 11/05/15 11/24/15; I brought this man back this week to review the wound after last week's deterioration. Culture grew Enterobacter and some staph aureus [MSSA]. I had ordered him Cipro but we don't seem to of been able to communicate with the patient therefore we've called in today. He is not been systemically unwell. Still using Aquacel 12/15/15; very little change in this wound since the last time I saw it. Horizontal wound over the lower sacral area probably about 2 cm in width and 0.8 cm in depth. There is no evidence of infection. There appears to be callus around this. In spite of the patient's best efforts I think there is an adequate pressure relief here. No current evidence of infection 12/22/15; horizontal pressure ulcer over the lower sacral area. Appears to be filling in and has better dimensions in appearance today. Still circumferential callus which would suggest inadequate offloading although everything else appears to be improved 12/29/15 horizontal pressure wound over  the lower sacral area. Appears to be filling in however skin groin down into the divot. No evidence of infection 01/05/16; comes in today with not much change in this wound. There is surrounding callus indicative of pressure. Not much change in the wound bed. This clearly require debridement today we have been using Hydrofera Blue 02/04/16: returns today after an absence. no systemic s/s of infection. once again, periwound callus is present suggesting pressure. 02/18/2016 -- she was away for 2 weeks traveling to [place] and during that time he had a new pressure injury to the left ischial tuberosity which he thinks may have been caused while transferring. Objective Constitutional Pulse regular. Respirations normal and unlabored. Afebrile. Vitals Time Taken: 1:47 PM, Height: 67 in, Weight: 147 lbs, BMI: 23, Temperature: 98.0 F, Pulse: 100 bpm, Respiratory Rate: 18 breaths/min, Blood Pressure: 108/71 mmHg. Eyes Nonicteric. Reactive to light. Ears, Nose, Mouth, and Throat Lips, teeth, and gums WNL.Marland Kitchen Moist mucosa without lesions. Neck supple and nontender. No palpable supraclavicular or cervical adenopathy. Normal sized without goiter. Respiratory WNL. No retractions.. Breath sounds WNL, No rubs, rales, rhonchi, or wheeze.. Cardiovascular Passon, Trevyon ([id number]) Heart rhythm and rate regular, no murmur or gallop.. Pedal Pulses WNL. No clubbing, cyanosis or edema. Chest Breasts symmetical and no nipple discharge.. Breast tissue WNL, no masses, lumps, or tenderness.. Lymphatic No adneopathy. No adenopathy. No adenopathy. Musculoskeletal Adexa without tenderness or enlargement.. Digits and nails w/o clubbing, cyanosis, infection, petechiae, ischemia, or inflammatory conditions.Marland Kitchen Psychiatric Judgement and insight Intact.. No evidence of depression, anxiety, or agitation.. General Notes: continues to build a lot of callus around the wound and subcutaneous tissue which also has some  maceration. I have sharply debrided this with a #3 curet and bleeding controlled with pressure. wound on the left ischial tuberosity is a St 2 pressure injury and no sharp debridement was required today. Integumentary (Hair, Skin) No suspicious lesions. No crepitus or fluctuance. No peri-wound warmth or erythema. No masses.. Wound #1 status is Open. Original cause of wound was Pressure Injury. The wound is located on the Left Coccyx. The wound measures 4cm length x 1.1cm width  x 0.3cm depth; 3.456cm^2 area and 1.037cm^3 volume. There is fat exposed. There is no tunneling or undermining noted. There is a large amount of serous drainage noted. The wound margin is thickened. There is medium (34-66%) pink granulation within the wound bed. There is a medium (34-66%) amount of necrotic tissue within the wound bed including Eschar and Adherent Slough. The periwound skin appearance exhibited: Callus, Maceration, Moist. The periwound skin appearance did not exhibit: Dry/Scaly. Periwound temperature was noted as No Abnormality. Wound #2 status is Open. Original cause of wound was Shear/Friction. The wound is located on the Left Gluteus. The wound measures 0.5cm length x 0.5cm width x 0.1cm depth; 0.196cm^2 area and 0.02cm^3 volume. The wound is limited to skin breakdown. There is no tunneling or undermining noted. There is a large amount of serous drainage noted. The wound margin is flat and intact. There is medium (34-66%) red, pink granulation within the wound bed. There is a medium (34-66%) amount of necrotic tissue within the wound bed including Adherent Slough. The periwound skin appearance exhibited: Moist. Periwound temperature was noted as No Abnormality. The periwound has tenderness on palpation. Assessment Active Problems ICD-10 L89.153 - Pressure ulcer of sacral region, stage 3 G82.22 - Paraplegia, incomplete [name], Jonluke ([id number]) W09.811L89.322 - Pressure ulcer of left buttock, stage  2 Procedures Wound #1 Wound #1 is a Pressure Ulcer located on the Left Coccyx . There was a Skin/Subcutaneous Tissue Debridement ([id number] debridement with total area of 4.4 sq cm performed by Evlyn KannerBritto, Sharian Delia, MD. with the following instrument(s): Curette to remove Viable and Non-Viable tissue/material including Exudate, Fibrin/Slough, and Subcutaneous after achieving pain control using Lidocaine 4% Topical Solution. A time out was conducted at 14:07, prior to the start of the procedure. A Minimum amount of bleeding was controlled with Pressure. The procedure was tolerated well with a pain level of 0 throughout and a pain level of 0 following the procedure. Post Debridement Measurements: 4cm length x 1.1cm width x 0.3cm depth; 1.037cm^3 volume. Post debridement Stage noted as Category/Stage III. Character of Wound/Ulcer Post Debridement requires further debridement. Severity of Tissue Post Debridement is: Fat layer exposed. Post procedure Diagnosis Wound #1: Same as Pre-Procedure Plan Wound Cleansing: Wound #1 Left Coccyx: Clean wound with Normal Saline. Anesthetic: Wound #1 Left Coccyx: Topical Lidocaine 4% cream applied to wound bed prior to debridement Skin Barriers/Peri-Wound Care: Wound #1 Left Coccyx: Skin Prep Primary Wound Dressing: Wound #1 Left Coccyx: Prisma Ag - moisten with saline Wound #2 Left Gluteus: Foam Secondary Dressing: Wound #1 Left Coccyx: Foam Tegaderm Wound #2 Left Gluteus: Tegaderm Dressing Change Frequency: [name], Akil ([id number]) Wound #1 Left Coccyx: Change dressing every day. Follow-up Appointments: Wound #1 Left Coccyx: Return Appointment in 2 weeks. Off-Loading: Wound #1 Left Coccyx: Turn and reposition every 2 hours Additional Orders / Instructions: Wound #1 Left Coccyx: Increase protein intake. Medications-please add to medication list.: Wound #1 Left Coccyx: Other: - Vitamin C, A and Zinc After aggressive debridement I have  recommended silver collagen to be changed every other day with a bordered foam. I rest offloading and appropriate protein and vitamin supplements. For his new wound on the left initial tuberosity I have recommended a bordered foam and constant protection and offloading. Electronic Signature(s) Signed: 02/18/2016 2:51:14 PM By: Evlyn KannerBritto, Paisly Fingerhut MD, FACS Entered By: Evlyn KannerBritto, Shiesha Jahn on 02/18/2016 14:51:14 [name], Daimien ([id number]) -------------------------------------------------------------------------------- SuperBill Details Patient Name: [name], Murriel Date of Service: 02/18/2016 Medical Record Number: [id number] Patient Account Number: [id number] Date of Birth/Sex: Dec 12, 1970 [age] y.o. Male)  Treating RN: Phillis Haggis Primary Care Physician: PATIENT, NO Other Clinician: Referring Physician: Treating Physician/Extender: Rudene Re in Treatment: 26 Diagnosis Coding ICD-10 Codes Code Description L89.153 Pressure ulcer of sacral region, stage 3 G82.22 Paraplegia, incomplete L89.322 Pressure ulcer of left buttock, stage 2 Facility Procedures CPT4 Code: 16109604 Description: 11042 - DEB SUBQ TISSUE 20 SQ CM/< ICD-10 Description Diagnosis L89.153 Pressure ulcer of sacral region, stage 3 G82.22 Paraplegia, incomplete L89.322 Pressure ulcer of left buttock, stage 2 Modifier: Quantity: 1 Physician Procedures CPT4 Code: 5409811 Description: 99213 - WC PHYS LEVEL 3 - EST PT ICD-10 Description Diagnosis L89.153 Pressure ulcer of sacral region, stage 3 G82.22 Paraplegia, incomplete L89.322 Pressure ulcer of left buttock, stage 2 Modifier: 25 Quantity: 1 CPT4 Code: 9147829 Description: 11042 - WC PHYS SUBQ TISS 20 SQ CM ICD-10 Description Diagnosis L89.153 Pressure ulcer of sacral region, stage 3 G82.22 Paraplegia, incomplete L89.322 Pressure ulcer of left buttock, stage 2 Modifier: Quantity: 1 Electronic Signature(s) Signed: 02/18/2016 2:51:34 PM By: Evlyn Kanner MD, FACS [place],  Leonette Most ([id number]) Entered By: Evlyn Kanner on 02/18/2016 14:51:34

## 2016-02-22 ENCOUNTER — Ambulatory Visit: Payer: Medicare Other | Attending: Physical Medicine & Rehabilitation | Admitting: Physical Therapy

## 2016-02-23 ENCOUNTER — Telehealth: Payer: Self-pay | Admitting: Physical Medicine & Rehabilitation

## 2016-02-23 ENCOUNTER — Encounter: Payer: Medicare Other | Admitting: Physical Medicine & Rehabilitation

## 2016-02-23 MED ORDER — BACLOFEN 10 MG PO TABS: 10.0000 mg | ORAL_TABLET | Freq: Three times a day (TID) | ORAL | 1 refills | Status: DC

## 2016-02-23 NOTE — Telephone Encounter (Signed)
Patient was late for appointment today and we tried to get him in tomorrow, but he has another appointment.  Dr. Allena KatzPatel does not have any openings this whole month and patient will need his medication.  Please advise.

## 2016-02-23 NOTE — Telephone Encounter (Signed)
Refill sent to the pharmacy and Mr [name] notified

## 2016-02-24 ENCOUNTER — Ambulatory Visit: Payer: Medicare Other | Admitting: Physical Medicine & Rehabilitation

## 2016-02-24 ENCOUNTER — Telehealth: Payer: Self-pay | Admitting: Physical Medicine & Rehabilitation

## 2016-02-24 NOTE — Telephone Encounter (Signed)
Left patient a voicemail that he will need to schedule an appointment with Dr. Allena KatzPatel in December or January to make sure he gets a medication refill.

## 2016-02-25 ENCOUNTER — Encounter: Payer: Medicare Other | Attending: Surgery | Admitting: Surgery

## 2016-02-25 ENCOUNTER — Ambulatory Visit (INDEPENDENT_AMBULATORY_CARE_PROVIDER_SITE_OTHER): Payer: Medicare Other | Admitting: Psychology

## 2016-02-25 DIAGNOSIS — L89153 Pressure ulcer of sacral region, stage 3: Secondary | ICD-10-CM | POA: Insufficient documentation

## 2016-02-25 DIAGNOSIS — F17210 Nicotine dependence, cigarettes, uncomplicated: Secondary | ICD-10-CM | POA: Diagnosis not present

## 2016-02-25 DIAGNOSIS — G8222 Paraplegia, incomplete: Secondary | ICD-10-CM | POA: Diagnosis not present

## 2016-02-25 DIAGNOSIS — Z8249 Family history of ischemic heart disease and other diseases of the circulatory system: Secondary | ICD-10-CM | POA: Insufficient documentation

## 2016-02-25 DIAGNOSIS — F3132 Bipolar disorder, current episode depressed, moderate: Secondary | ICD-10-CM

## 2016-02-25 DIAGNOSIS — L89322 Pressure ulcer of left buttock, stage 2: Secondary | ICD-10-CM | POA: Insufficient documentation

## 2016-02-26 NOTE — Progress Notes (Signed)
[name], [name] ([id number]) Visit Report for 02/25/2016 Arrival Information Details Patient Name: [name], [name] Date of Service: 02/25/2016 12:45 PM Medical Record Number: [id number] Patient Account Number: [id number] Date of Birth/Sex: [birth year] [age] y.o. Male) Treating RN: Phillis HaggisPinkerton, Debi Primary Care Physician: PATIENT, NO Other Clinician: Referring Physician: Treating Physician/Extender: Rudene ReBritto, Errol Weeks in Treatment: 27 Visit Information History Since Last Visit All ordered tests and consults were completed: No Patient Arrived: Wheel Chair Added or deleted any medications: No Arrival Time: 13:00 Any new allergies or adverse reactions: No Accompanied By: self Had a fall or experienced change in No activities of daily living that may affect Transfer Assistance: Other risk of falls: Patient Requires Transmission-Based No Signs or symptoms of abuse/neglect since last No Precautions: visito Patient Has Alerts: No Hospitalized since last visit: No Pain Present Now: No Electronic Signature(s) Signed: 02/25/2016 5:03:25 PM By: Alejandro MullingPinkerton, Debra Entered By: Alejandro MullingPinkerton, Debra on 02/25/2016 13:06:43 [name], [name] ([id number]) -------------------------------------------------------------------------------- Encounter Discharge Information Details Patient Name: [name], [name] Date of Service: 02/25/2016 12:45 PM Medical Record Number: [id number] Patient Account Number: [id number] Date of Birth/Sex: [birth year] [age] y.o. Male) Treating RN: Phillis HaggisPinkerton, Debi Primary Care Physician: PATIENT, NO Other Clinician: Referring Physician: Treating Physician/Extender: Rudene ReBritto, Errol Weeks in Treatment: 5527 Encounter Discharge Information Items Discharge Pain Level: 0 Discharge Condition: Stable Ambulatory Status: Wheelchair Discharge Destination: Home Transportation: Private Auto Accompanied By: self Schedule Follow-up Appointment: Yes Medication Reconciliation completed and provided to  Patient/Care Yes Kelby Lotspeich: Provided on Clinical Summary of Care: 02/25/2016 Form Type Recipient Paper [facility] Electronic Signature(s) Signed: 02/25/2016 1:40:05 PM By: Gwenlyn PerkingMoore, Shelia Entered By: Gwenlyn PerkingMoore, Shelia on 02/25/2016 13:40:05 [name], [name] ([id number]) -------------------------------------------------------------------------------- Lower Extremity Assessment Details Patient Name: [name], [name] Date of Service: 02/25/2016 12:45 PM Medical Record Number: [id number] Patient Account Number: [id number] Date of Birth/Sex: [birth year] [age] y.o. Male) Treating RN: Phillis HaggisPinkerton, Debi Primary Care Physician: PATIENT, NO Other Clinician: Referring Physician: Treating Physician/Extender: Rudene ReBritto, Errol Weeks in Treatment: 27 Electronic Signature(s) Signed: 02/25/2016 5:03:25 PM By: Alejandro MullingPinkerton, Debra Entered By: Alejandro MullingPinkerton, Debra on 02/25/2016 13:07:22 [name], [name] ([id number]) -------------------------------------------------------------------------------- Multi [name] Chart Details Patient Name: [name], [name] Date of Service: 02/25/2016 12:45 PM Medical Record Number: [id number] Patient Account Number: [id number] Date of Birth/Sex: [birth year] [age] y.o. Male) Treating RN: Phillis HaggisPinkerton, Debi Primary Care Physician: PATIENT, NO Other Clinician: Referring Physician: Treating Physician/Extender: Rudene ReBritto, Errol Weeks in Treatment: 27 Vital Signs Height(in): 67 Pulse(bpm): 117 Weight(lbs): 147 Blood Pressure 132/85 (mmHg): Body Mass Index(BMI): 23 Temperature(F): 98.0 Respiratory Rate 18 (breaths/min): Photos: [1:No Photos] [2:No Photos] [N/A:N/A] [name] Location: [1:Left Coccyx] [2:Left Gluteus] [N/A:N/A] Wounding Event: [1:Pressure Injury] [2:Shear/Friction] [N/A:N/A] Primary Etiology: [1:Pressure Ulcer] [2:Skin Tear] [N/A:N/A] Comorbid History: [1:Paraplegia] [2:Paraplegia] [N/A:N/A] Date Acquired: [1:12/24/2014] [2:02/11/2016] [N/A:N/A] Weeks of Treatment: [1:27] [2:1]  [N/A:N/A] [name] Status: [1:Open] [2:Open] [N/A:N/A] Measurements L x W x D 0.6x1x0.4 [2:0.5x0.5x0.1] [N/A:N/A] (cm) Area (cm) : [1:0.471] [2:0.196] [N/A:N/A] Volume (cm) : [1:0.188] [2:0.02] [N/A:N/A] % Reduction in Area: [1:55.60%] [2:0.00%] [N/A:N/A] % Reduction in Volume: 64.50% [2:0.00%] [N/A:N/A] Classification: [1:Category/Stage III] [2:Partial Thickness] [N/A:N/A] Exudate Amount: [1:Large] [2:Small] [N/A:N/A] Exudate Type: [1:Serous] [2:Serous] [N/A:N/A] Exudate Color: [1:amber] [2:amber] [N/A:N/A] Foul Odor After [1:Yes] [2:No] [N/A:N/A] Cleansing: Odor Anticipated Due to No [2:N/A] [N/A:N/A] Product Use: [name] Margin: [1:Thickened] [2:Flat and Intact] [N/A:N/A] Granulation Amount: [1:Medium (34-66%)] [2:Large (67-100%)] [N/A:N/A] Granulation Quality: [1:Pink] [2:Red, Pink] [N/A:N/A] Necrotic Amount: [1:Medium (34-66%)] [2:None Present (0%)] [N/A:N/A] Necrotic Tissue: [1:Eschar, Adherent Slough] [2:N/A] [N/A:N/A] Exposed Structures: [1:Fat: Yes Fascia: No] [2:Fascia: No Fat: No] [N/A:N/A] Tendon: No Tendon: No Muscle: No Muscle: No Joint: No  Joint: No Bone: No Bone: No Limited to Skin Breakdown Epithelialization: None None N/A Periwound Skin Texture: Callus: Yes No Abnormalities Noted N/A Periwound Skin Maceration: Yes Moist: Yes N/A Moisture: Moist: Yes Dry/Scaly: No Periwound Skin Color: No Abnormalities Noted No Abnormalities Noted N/A Temperature: No Abnormality No Abnormality N/A Tenderness on No Yes N/A Palpation: [name] Preparation: Ulcer Cleansing: Ulcer Cleansing: N/A Rinsed/Irrigated with Rinsed/Irrigated with Saline Saline Topical Anesthetic Topical Anesthetic Applied: Other: lidocaine Applied: Other: lodocaine 4% 4% Treatment Notes Electronic Signature(s) Signed: 02/25/2016 5:03:25 PM By: Alejandro MullingPinkerton, Debra Entered By: Alejandro MullingPinkerton, Debra on 02/25/2016 13:20:18 [name], [name]  ([id number]) -------------------------------------------------------------------------------- Multi-Disciplinary Care Plan Details Patient Name: [name], [name] Date of Service: 02/25/2016 12:45 PM Medical Record Number: [id number] Patient Account Number: [id number] Date of Birth/Sex: July 04, 1970 [age] y.o. Male) Treating RN: Phillis HaggisPinkerton, Debi Primary Care Physician: PATIENT, NO Other Clinician: Referring Physician: Treating Physician/Extender: Rudene ReBritto, Errol Weeks in Treatment: 1527 Active Inactive Abuse / Safety / Falls / Self Care Management Nursing Diagnoses: Potential for falls Goals: Patient will remain injury free Date Initiated: 08/17/2015 Goal Status: Active Interventions: Assess fall risk on admission and as needed Notes: Nutrition Nursing Diagnoses: Imbalanced nutrition Goals: Patient/caregiver agrees to and verbalizes understanding of need to use nutritional supplements and/or vitamins as prescribed Date Initiated: 08/17/2015 Goal Status: Active Interventions: Assess patient nutrition upon admission and as needed per policy Notes: Orientation to the [name] Nursing Diagnoses: Knowledge deficit related to the [name] program Goals: Patient/caregiver will verbalize understanding of the [name] [name], [name] ([id number]) Date Initiated: 08/17/2015 Goal Status: Active Interventions: Provide education on orientation to the [name] center Notes: Pressure Nursing Diagnoses: Knowledge deficit related to causes and risk factors for pressure ulcer development Goals: Patient will remain free from development of additional pressure ulcers Date Initiated: 08/17/2015 Goal Status: Active Interventions: Assess: immobility, friction, shearing, incontinence upon admission and as needed Notes: [name]/Skin Impairment Nursing Diagnoses: Impaired tissue integrity Goals: Ulcer/skin breakdown will have a volume reduction of 30% by week  4 Date Initiated: 08/17/2015 Goal Status: Active Ulcer/skin breakdown will have a volume reduction of 50% by week 8 Date Initiated: 08/17/2015 Goal Status: Active Ulcer/skin breakdown will have a volume reduction of 80% by week 12 Date Initiated: 08/17/2015 Goal Status: Active Interventions: Assess patient/caregiver ability to obtain necessary supplies Assess ulceration(s) every visit Notes: Electronic Signature(s) Signed: 02/25/2016 5:03:25 PM By: Servando SnarePinkerton, Debra Biello, [name] ([id number]) Entered By: Alejandro MullingPinkerton, Debra on 02/25/2016 13:17:33 [name], [name] ([id number]) -------------------------------------------------------------------------------- Pain Assessment Details Patient Name: [name], [name] Date of Service: 02/25/2016 12:45 PM Medical Record Number: [id number] Patient Account Number: [id number] Date of Birth/Sex: July 04, 1970 [age] y.o. Male) Treating RN: Phillis HaggisPinkerton, Debi Primary Care Physician: PATIENT, NO Other Clinician: Referring Physician: Treating Physician/Extender: Rudene ReBritto, Errol Weeks in Treatment: 27 Active Problems Location of Pain Severity and Description of Pain Patient Has Paino No Site Locations With Dressing Change: No Pain Management and Medication Current Pain Management: Electronic Signature(s) Signed: 02/25/2016 5:03:25 PM By: Alejandro MullingPinkerton, Debra Entered By: Alejandro MullingPinkerton, Debra on 02/25/2016 13:06:49 [name], [name] ([id number]) -------------------------------------------------------------------------------- Patient/Caregiver Education Details Patient Name: [name], Katsumi Date of Service: 02/25/2016 12:45 PM Medical Record Number: [id number] Patient Account Number: [id number] Date of Birth/Gender: July 04, 1970 [age] y.o. Male) Treating RN: Phillis HaggisPinkerton, Debi Primary Care Physician: PATIENT, NO Other Clinician: Referring Physician: Treating Physician/Extender: Rudene ReBritto, Errol Weeks in Treatment: 6527 Education Assessment Education Provided To: Patient Education  Topics Provided [name]/Skin Impairment: Handouts: Other: change dressing as ordered Methods: Demonstration, Explain/Verbal Responses: State content correctly Electronic Signature(s) Signed: 02/25/2016 5:03:25 PM By:  Alejandro Mulling Entered By: Alejandro Mulling on 02/25/2016 13:20:57 Matthew Evangelist ([id number]) -------------------------------------------------------------------------------- [name] Assessment Details Patient Name: [name], [name] Date of Service: 02/25/2016 12:45 PM Medical Record Number: [id number] Patient Account Number: [id number] Date of Birth/Sex: November 17, 1970 (45 y.o. Male) Treating RN: Phillis Haggis Primary Care Physician: PATIENT, NO Other Clinician: Referring Physician: Treating Physician/Extender: Rudene Re in Treatment: 27 [name] Status [name] Number: 1 Primary Etiology: Pressure Ulcer [name] Location: Left Coccyx [name] Status: Open Wounding Event: Pressure Injury Comorbid History: Paraplegia Date Acquired: 12/24/2014 Weeks Of Treatment: 27 Clustered [name]: No Photos Photo Uploaded By: Alejandro Mulling on 02/25/2016 16:51:37 [name] Measurements Length: (cm) 0.6 Width: (cm) 1 Depth: (cm) 0.4 Area: (cm) 0.471 Volume: (cm) 0.188 % Reduction in Area: 55.6% % Reduction in Volume: 64.5% Epithelialization: None Tunneling: No Undermining: No [name] Description Classification: Category/Stage III Foul Odor Aft [name] Margin: Thickened Due to Produc Exudate Amount: Large Exudate Type: Serous Exudate Color: amber er Cleansing: Yes t Use: No [name] Bed Granulation Amount: Medium (34-66%) Exposed Structure Granulation Quality: Pink Fascia Exposed: No Necrotic Amount: Medium (34-66%) Fat Layer Exposed: Yes Necrotic Quality: Eschar, Adherent Slough Tendon Exposed: No [name], [name] ([id number]) Muscle Exposed: No Joint Exposed: No Bone Exposed: No Periwound Skin Texture Texture Color No Abnormalities Noted: No No Abnormalities Noted:  No Callus: Yes Temperature / Pain Moisture Temperature: No Abnormality No Abnormalities Noted: No Dry / Scaly: No Maceration: Yes Moist: Yes [name] Preparation Ulcer Cleansing: Rinsed/Irrigated with Saline Topical Anesthetic Applied: Other: lidocaine 4%, Treatment Notes [name] #1 (Left Coccyx) 1. Cleansed with: Clean [name] with Normal Saline 2. Anesthetic Topical Lidocaine 4% cream to [name] bed prior to debridement 3. Peri-[name] Care: Skin Prep 4. Dressing Applied: Prisma Ag 5. Secondary Dressing Applied Foam Tegaderm Electronic Signature(s) Signed: 02/25/2016 5:03:25 PM By: Alejandro Mulling Entered By: Alejandro Mulling on 02/25/2016 13:17:00 Matthew Evangelist ([id number]) -------------------------------------------------------------------------------- [name] Assessment Details Patient Name: Matthew Evangelist Date of Service: 02/25/2016 12:45 PM Medical Record Number: [id number] Patient Account Number: [id number] Date of Birth/Sex: 08-05-1970 (45 y.o. Male) Treating RN: Phillis Haggis Primary Care Physician: PATIENT, NO Other Clinician: Referring Physician: Treating Physician/Extender: Rudene Re in Treatment: 27 [name] Status [name] Number: 2 Primary Etiology: Skin Tear [name] Location: Left Gluteus [name] Status: Open Wounding Event: Shear/Friction Comorbid History: Paraplegia Date Acquired: 02/11/2016 Weeks Of Treatment: 1 Clustered [name]: No Photos Photo Uploaded By: Alejandro Mulling on 02/25/2016 16:51:56 [name] Measurements Length: (cm) 0.5 Width: (cm) 0.5 Depth: (cm) 0.1 Area: (cm) 0.196 Volume: (cm) 0.02 % Reduction in Area: 0% % Reduction in Volume: 0% Epithelialization: None Tunneling: No Undermining: No [name] Description Classification: Partial Thickness [name] Margin: Flat and Intact Exudate Amount: Small Exudate Type: Serous Exudate Color: amber Foul Odor After Cleansing: No [name] Bed Granulation Amount: Large (67-100%) Exposed  Structure Granulation Quality: Red, Pink Fascia Exposed: No Necrotic Amount: None Present (0%) Fat Layer Exposed: No Tendon Exposed: No ABDULAHAD, MEDEROS ([id number]) Muscle Exposed: No Joint Exposed: No Bone Exposed: No Limited to Skin Breakdown Periwound Skin Texture Texture Color No Abnormalities Noted: No No Abnormalities Noted: No Moisture Temperature / Pain No Abnormalities Noted: No Temperature: No Abnormality Moist: Yes Tenderness on Palpation: Yes [name] Preparation Ulcer Cleansing: Rinsed/Irrigated with Saline Topical Anesthetic Applied: Other: lodocaine 4%, Treatment Notes [name] #2 (Left Gluteus) 1. Cleansed with: Clean [name] with Normal Saline 2. Anesthetic Topical Lidocaine 4% cream to [name] bed prior to debridement 5. Secondary Dressing Applied Foam Tegaderm Electronic Signature(s) Signed: 02/25/2016 5:03:25 PM By: Alejandro Mulling Entered By: Alejandro Mulling on 02/25/2016 13:17:26  TRI, CHITTICK ([id number]) -------------------------------------------------------------------------------- Vitals Details Patient Name: JANIEL, CRISOSTOMO Date of Service: 02/25/2016 12:45 PM Medical Record Number: [id number] Patient Account Number: [id number] Date of Birth/Sex: 19-Dec-1970 (45 y.o. Male) Treating RN: Phillis Haggis Primary Care Physician: PATIENT, NO Other Clinician: Referring Physician: Treating Physician/Extender: Rudene Re in Treatment: 27 Vital Signs Time Taken: 13:06 Temperature (F): 98.0 Height (in): 67 Pulse (bpm): 117 Weight (lbs): 147 Respiratory Rate (breaths/min): 18 Body Mass Index (BMI): 23 Blood Pressure (mmHg): 132/85 Reference Range: 80 - 120 mg / dl Electronic Signature(s) Signed: 02/25/2016 5:03:25 PM By: Alejandro Mulling Entered By: Alejandro Mulling on 02/25/2016 13:07:17

## 2016-02-26 NOTE — Progress Notes (Signed)
[name], Andranik ([id number]) Visit Report for 02/25/2016 Chief Complaint Document Details Patient Name: [name], [name] Date of Service: 02/25/2016 12:45 PM Medical Record Number: [id number] Patient Account Number: [id number] Date of Birth/Sex: 1970/05/14 48(45 y.o. Male) Treating RN: Phillis HaggisPinkerton, Debi Primary Care Physician: PATIENT, NO Other Clinician: Referring Physician: Treating Physician/Extender: Rudene ReBritto, Shian Goodnow Weeks in Treatment: 4727 Information Obtained from: Patient Chief Complaint 08/17/15; the patient is recently really relocated [place] [facility] [place]. He is here to establish a wound care for a stage III ulcer on his lower sacral area. Electronic Signature(s) Signed: 02/25/2016 1:28:00 PM By: Evlyn KannerBritto, Apple Dearmas MD, FACS Entered By: Evlyn KannerBritto, Akaylah Lalley on 02/25/2016 13:28:00 [name], [name] ([id number]) -------------------------------------------------------------------------------- Debridement Details Patient Name: [name], [name] Date of Service: 02/25/2016 12:45 PM Medical Record Number: [id number] Patient Account Number: [id number] Date of Birth/Sex: 1970/05/14 [age] y.o. Male) Treating RN: Phillis HaggisPinkerton, Debi Primary Care Physician: PATIENT, NO Other Clinician: Referring Physician: Treating Physician/Extender: Rudene ReBritto, Kimie Pidcock Weeks in Treatment: 27 Debridement Performed for Wound #1 Left Coccyx Assessment: Performed By: Physician Evlyn KannerBritto, Legend Tumminello, MD Debridement: Debridement Pre-procedure Yes - 13:21 Verification/Time Out Taken: Start Time: 13:22 Pain Control: Lidocaine 4% Topical Solution Level: Skin/Subcutaneous Tissue Total Area Debrided (L x 0.6 (cm) x 1 (cm) = 0.6 (cm) W): Tissue and other Viable, Non-Viable, Exudate, Fibrin/Slough, Subcutaneous material debrided: Instrument: Curette Bleeding: Minimum Hemostasis Achieved: Pressure End Time: 13:25 Procedural Pain: 0 Post Procedural Pain: 0 Response to Treatment: Procedure was tolerated well Post Debridement Measurements of  Total Wound Length: (cm) 0.6 Stage: Category/Stage III Width: (cm) 1 Depth: (cm) 0.4 Volume: (cm) 0.188 Character of Wound/Ulcer Post Requires Further Debridement: Debridement Severity of Tissue Post Fat layer exposed Debridement: Post Procedure Diagnosis Same as Pre-procedure Electronic Signature(s) Signed: 02/25/2016 1:27:54 PM By: Evlyn KannerBritto, Sarafina Puthoff MD, FACS Signed: 02/25/2016 5:03:25 PM By: Alejandro MullingPinkerton, Debra Entered By: Evlyn KannerBritto, Quincie Haroon on 02/25/2016 13:27:54 [name], [name] ([id number]) [name], [name] ([id number]) -------------------------------------------------------------------------------- HPI Details Patient Name: [name], [name] Date of Service: 02/25/2016 12:45 PM Medical Record Number: [id number] Patient Account Number: [id number] Date of Birth/Sex: 1970/05/14 [age] y.o. 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 He was diagnosed with what sounds like a stage III pressure ulcer in September 2016 although this may have been present for some time longer than that perhaps July 2016. [place] By his description there were using Silvadene cream for a period of time and then 3 months ago change to Aquacel Ag which she is continued and changes daily with help from his mother. [facility] [place] [facility] [place] [facility] [place] He went to urgent care to follow-up on his wound on 08/04/2015 they did a culture of this wound that showed Pseudomonas and MSSA. He was placed on an antibiotic but doesn't remember which one it is. He has continued using Aquacel Ag and a foam based cover. I don't have any of his records from the wound care center  [place] although the patient states he did have an MRI that did not show osteomyelitis. He is not aware of having a bone culture done and doesn't think that he had exposed bone on this and any point. The patient does in and out cath's and has no urinary incontinence issues. He is on appropriate bowel regimen which she administers  suppositories. He is able to position himself appropriately and is wheelchair to try and avoid pressure over this wound 08/24/15; small horizontal wound over his lower sacral area. Requires debridement of surface slough and nonviable subcutaneous tissue. There is a small amount of undermining noted no evidence of infection. On the right side of this wound there is some senescent rolled edges which may need debridement I did not do this today. 09/01/15 this is a small wound over the lower sacral area. It has tunneling superiorly. This goes roughly 1 cm. Nonviable subcutaneous tissue at the inferior aspect of the wound base. No real evidence of infection 09/07/15; small horizontal wound over the lower sacral area. Last week it had 1 cm of superior tunneling which appears to of resolved. No evidence of infection 09/21/15; continues to have a small horizontal wound over the lower sacral area. We have been using Prisma and this has contracted. He tells me that his mother is aware medication not able to change the dressing although he states that he is able to do this 09/28/15; a small horizontal wound over his lower sacral area is all that is left of this wound which was probably much larger and deeper at one point. Depth today at 0.7 cm. Base of this had some nonviable subcutaneous tissue that I removed. Appears to be stalling on current dressing which is Prisma 10/19/15; I have not seen this patient in several weeks. He arrives today with a linear/horizontal wound across his lower sacral area. Considerable depth of this wound. Extensive debridement done 11/17/15; patient  arrives for two-week visit. He is not doing well wound is bigger with odor and drainage. Has been using Aquacel Ag. I have not seen this in over a month although I think he was seen by the physician covering me 2 weeks ago on 11/05/15 11/24/15; I brought this man back this week to review the wound after last week's deterioration. Culture grew Enterobacter and some staph aureus [MSSA]. I had ordered him Cipro but we don't seem to of been able to communicate with the patient therefore we've called in today. He is not been systemically unwell. Still using Aquacel 12/15/15; very little change in this wound since the last time I saw it. Horizontal wound over the lower sacral Sumners, Koltyn ([id number]) area probably about 2 cm in width and 0.8 cm in depth. There is no evidence of infection. There appears to be callus around this. In spite of the patient's best efforts I think there is an adequate pressure relief here. No current evidence of infection 12/22/15; horizontal pressure ulcer over the lower sacral area. Appears to be filling in and has better dimensions in appearance today. Still circumferential callus which would suggest inadequate offloading although everything else appears to be improved 12/29/15 horizontal pressure wound over the lower sacral area. Appears to be filling in however skin groin down into the divot. No evidence of infection 01/05/16; comes in today with not much change in this wound. There is surrounding callus indicative of pressure. Not much change in the wound bed. This clearly require debridement today we have been using Hydrofera Blue 02/04/16: returns today after an absence. no systemic s/s of infection. once again, periwound callus is present suggesting pressure. 02/18/2016 -- she was away for 2 weeks traveling to [place] and during that time he had a new pressure injury to the left ischial tuberosity which he thinks may have been caused while transferring. Electronic  Signature(s) Signed: 02/25/2016 1:28:07 PM By: Meyer Russel,  Glory BuffErrol MD, FACS Entered By: Evlyn KannerBritto, Nader Boys on 02/25/2016 13:28:07 [name], Isadore ([id number]) -------------------------------------------------------------------------------- Physical Exam Details Patient Name: [name], [name] Date of Service: 02/25/2016 12:45 PM Medical Record Number: [id number] Patient Account Number: [id number] Date of Birth/Sex: 08/03/1970 [age] y.o. Male) Treating RN: Phillis HaggisPinkerton, Debi Primary Care Physician: PATIENT, NO Other Clinician: Referring Physician: Treating Physician/Extender: Rudene ReBritto, Jacobey Gura Weeks in Treatment: 27 Constitutional . Pulse regular. Respirations normal and unlabored. Afebrile. . Eyes Nonicteric. Reactive to light. Ears, Nose, Mouth, and Throat Lips, teeth, and gums WNL.Marland Kitchen. Moist mucosa without lesions. Neck supple and nontender. No palpable supraclavicular or cervical adenopathy. Normal sized without goiter. Respiratory WNL. No retractions.. Cardiovascular Pedal Pulses WNL. No clubbing, cyanosis or edema. Lymphatic No adneopathy. No adenopathy. No adenopathy. Musculoskeletal Adexa without tenderness or enlargement.. Digits and nails w/o clubbing, cyanosis, infection, petechiae, ischemia, or inflammatory conditions.. Integumentary (Hair, Skin) No suspicious lesions. No crepitus or fluctuance. No peri-wound warmth or erythema. No masses.Marland Kitchen. Psychiatric Judgement and insight Intact.. No evidence of depression, anxiety, or agitation.. Notes he again needed sharp debridement around the sacral ulcer and the depths of it but after debriding at the base of the ulcer is very clean and there is healthy granulation tissue which does not probe down to bone. the new area on his left gluteal and ischial region is more superficial and is healing well Electronic Signature(s) Signed: 02/25/2016 1:29:26 PM By: Evlyn KannerBritto, Lynae Pederson MD, FACS Entered By: Evlyn KannerBritto, Rhodes Calvert on 02/25/2016 13:29:25 [name], [name]  ([id number]) -------------------------------------------------------------------------------- Physician Orders Details Patient Name: [name], Xayvion Date of Service: 02/25/2016 12:45 PM Medical Record Number: [id number] Patient Account Number: [id number] Date of Birth/Sex: 08/03/1970 [age] y.o. Male) Treating RN: Phillis HaggisPinkerton, Debi Primary Care Physician: PATIENT, NO Other Clinician: Referring Physician: Treating Physician/Extender: Rudene ReBritto, Shallyn Constancio Weeks in Treatment: 4927 Verbal / Phone Orders: Yes ClinicianAshok Cordia: Pinkerton, Debi Read Back and Verified: Yes Diagnosis Coding Wound Cleansing Wound #1 Left Coccyx o Clean wound with Normal Saline. Anesthetic Wound #1 Left Coccyx o Topical Lidocaine 4% cream applied to wound bed prior to debridement Skin Barriers/Peri-Wound Care Wound #1 Left Coccyx o Skin Prep Primary Wound Dressing Wound #1 Left Coccyx o Prisma Ag - moisten with saline Wound #2 Left Gluteus o Tegaderm o Foam Secondary Dressing Wound #1 Left Coccyx o Foam o Tegaderm Wound #2 Left Gluteus o Tegaderm Dressing Change Frequency Wound #1 Left Coccyx o Change dressing every day. Follow-up Appointments Wound #1 Left Coccyx o Return Appointment in 2 weeks. [name], Darrio ([id number]) Off-Loading Wound #1 Left Coccyx o Turn and reposition every 2 hours Additional Orders / Instructions Wound #1 Left Coccyx o Increase protein intake. Medications-please add to medication list. Wound #1 Left Coccyx o Other: - Vitamin C, A and Zinc Electronic Signature(s) Signed: 02/25/2016 4:03:12 PM By: Evlyn KannerBritto, Lopaka Karge MD, FACS Signed: 02/25/2016 5:03:25 PM By: Alejandro MullingPinkerton, Debra Entered By: Alejandro MullingPinkerton, Debra on 02/25/2016 13:26:15 [name], Nell ([id number]) -------------------------------------------------------------------------------- Problem List Details Patient Name: [name], Tymier Date of Service: 02/25/2016 12:45 PM Medical Record Number: [id number] Patient  Account Number: [id number] Date of Birth/Sex: 08/03/1970 [age] y.o. Male) Treating RN: Phillis HaggisPinkerton, Debi Primary Care Physician: PATIENT, NO Other Clinician: Referring Physician: Treating Physician/Extender: Rudene ReBritto, Evelyne Makepeace Weeks in Treatment: 27 Active Problems ICD-10 Encounter Code Description Active Date Diagnosis L89.153 Pressure ulcer of sacral region, stage 3 08/17/2015 Yes G82.22 Paraplegia, incomplete 08/17/2015 Yes L89.322 Pressure ulcer of left buttock, stage 2 02/18/2016 Yes Inactive Problems Resolved Problems Electronic Signature(s) Signed: 02/25/2016 1:27:46 PM By: Evlyn KannerBritto, Ravinder Lukehart MD, FACS Entered By: Evlyn KannerBritto, Zarahi Fuerst on 02/25/2016 13:27:46 [name], Shamell ([id number]) --------------------------------------------------------------------------------  Progress Note Details Patient Name: OLLIVANDER, SEE Date of Service: 02/25/2016 12:45 PM Medical Record Number: [id number] Patient Account Number: [id number] Date of Birth/Sex: 29-May-1970 (45 y.o. Male) Treating RN: Phillis Haggis Primary Care Physician: PATIENT, NO Other Clinician: Referring Physician: Treating Physician/Extender: Rudene [name] in Treatment: 28 Subjective Chief Complaint Information obtained from Patient 08/17/15; the patient is recently really relocated from [facility]. He is here to establish a wound care for a stage III ulcer on his lower sacral area. 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 He was diagnosed with what sounds like a stage III pressure ulcer in September 2016 although this may have been present for some time longer than that perhaps July 2016. He was followed by wound care in [place]. By his description there were using Silvadene cream for a period of time and then 3 months ago change to Aquacel Ag which she is continued and changes daily with help from his  mother. He has recently relocated to [facility]. He went to urgent care to follow- up on his wound on 08/04/2015 they did a culture of this wound that showed Pseudomonas and MSSA. He was placed on an antibiotic but doesn't remember which one it is. He has continued using Aquacel Ag and a foam based cover. I don't have any of his records from the wound care center in [place] although the patient states he did have an MRI that did not show osteomyelitis. He is not aware of having a bone culture done and doesn't think that he had exposed bone on this and any point. The patient does in and out cath's and has no urinary incontinence issues. He is on appropriate bowel regimen which she administers suppositories. He is able to position himself appropriately and is wheelchair to try and avoid pressure over this wound 08/24/15; small horizontal wound over his lower sacral area. Requires debridement of surface slough and nonviable subcutaneous tissue. There is a small amount of undermining noted no evidence of infection. On the right side of this wound there is some senescent rolled edges which may need debridement I did not do this today. 09/01/15 this is a small wound over the lower sacral area. It has tunneling superiorly. This goes roughly 1 cm. Nonviable subcutaneous tissue at the inferior aspect of the wound base. No real evidence of infection 09/07/15; small horizontal wound over the lower sacral area. Last week it had 1 cm of superior tunneling which appears to of resolved. No evidence of infection 09/21/15; continues to have a small horizontal wound over the lower sacral area. We have been using Prisma and this has contracted. He tells me that his mother is aware medication not able to change the dressing although he states that he is able to do this 09/28/15; a small horizontal wound over his lower sacral area is all that is left of this wound which was probably much larger and  deeper at one point. Depth today at 0.7 cm. Base of this had some nonviable subcutaneous tissue that I removed. Appears to be stalling on current dressing which is Prisma 10/19/15; I have not seen this patient in several weeks. He arrives today with a linear/horizontal wound across his lower sacral area. Considerable depth of this wound. Extensive debridement done 11/17/15; patient arrives for two-week visit. He is not doing well wound is bigger with odor and  drainage. Has AIMEE, TIMMONS ([id number]) been using Aquacel Ag. I have not seen this in over a month although I think he was seen by the physician covering me 2 weeks ago on 11/05/15 11/24/15; I brought this man back this week to review the wound after last week's deterioration. Culture grew Enterobacter and some staph aureus [MSSA]. I had ordered him Cipro but we don't seem to of been able to communicate with the patient therefore we've called in today. He is not been systemically unwell. Still using Aquacel 12/15/15; very little change in this wound since the last time I saw it. Horizontal wound over the lower sacral area probably about 2 cm in width and 0.8 cm in depth. There is no evidence of infection. There appears to be callus around this. In spite of the patient's best efforts I think there is an adequate pressure relief here. No current evidence of infection 12/22/15; horizontal pressure ulcer over the lower sacral area. Appears to be filling in and has better dimensions in appearance today. Still circumferential callus which would suggest inadequate offloading although everything else appears to be improved 12/29/15 horizontal pressure wound over the lower sacral area. Appears to be filling in however skin groin down into the divot. No evidence of infection 01/05/16; comes in today with not much change in this wound. There is surrounding callus indicative of pressure. Not much change in the wound bed. This clearly require debridement  today we have been using Hydrofera Blue 02/04/16: returns today after an absence. no systemic s/s of infection. once again, periwound callus is present suggesting pressure. 02/18/2016 -- she was away for 2 weeks traveling to [place] and during that time he had a new pressure injury to the left ischial tuberosity which he thinks may have been caused while transferring. Objective Constitutional Pulse regular. Respirations normal and unlabored. Afebrile. Vitals Time Taken: 1:06 PM, Height: 67 in, Weight: 147 lbs, BMI: 23, Temperature: 98.0 F, Pulse: 117 bpm, Respiratory Rate: 18 breaths/min, Blood Pressure: 132/85 mmHg. Eyes Nonicteric. Reactive to light. Ears, Nose, Mouth, and Throat Lips, teeth, and gums WNL.Marland Kitchen Moist mucosa without lesions. Neck supple and nontender. No palpable supraclavicular or cervical adenopathy. Normal sized without goiter. Respiratory WNL. No retractions.. Cardiovascular Boisselle, Nil ([id number]) Pedal Pulses WNL. No clubbing, cyanosis or edema. Lymphatic No adneopathy. No adenopathy. No adenopathy. Musculoskeletal Adexa without tenderness or enlargement.. Digits and nails w/o clubbing, cyanosis, infection, petechiae, ischemia, or inflammatory conditions.Marland Kitchen Psychiatric Judgement and insight Intact.. No evidence of depression, anxiety, or agitation.. General Notes: he again needed sharp debridement around the sacral ulcer and the depths of it but after debriding at the base of the ulcer is very clean and there is healthy granulation tissue which does not probe down to bone. the new area on his left gluteal and ischial region is more superficial and is healing well Integumentary (Hair, Skin) No suspicious lesions. No crepitus or fluctuance. No peri-wound warmth or erythema. No masses.. Wound #1 status is Open. Original cause of wound was Pressure Injury. The wound is located on the Left Coccyx. The wound measures 0.6cm length x 1cm width x 0.4cm depth;  0.471cm^2 area and 0.188cm^3 volume. There is fat exposed. There is no tunneling or undermining noted. There is a large amount of serous drainage noted. The wound margin is thickened. There is medium (34-66%) pink granulation within the wound bed. There is a medium (34-66%) amount of necrotic tissue within the wound bed including Eschar and Adherent Slough. The periwound skin appearance exhibited:  Callus, Maceration, Moist. The periwound skin appearance did not exhibit: Dry/Scaly. Periwound temperature was noted as No Abnormality. Wound #2 status is Open. Original cause of wound was Shear/Friction. The wound is located on the Left Gluteus. The wound measures 0.5cm length x 0.5cm width x 0.1cm depth; 0.196cm^2 area and 0.02cm^3 volume. The wound is limited to skin breakdown. There is no tunneling or undermining noted. There is a small amount of serous drainage noted. The wound margin is flat and intact. There is large (67-100%) red, pink granulation within the wound bed. There is no necrotic tissue within the wound bed. The periwound skin appearance exhibited: Moist. Periwound temperature was noted as No Abnormality. The periwound has tenderness on palpation. Assessment Active Problems ICD-10 L89.153 - Pressure ulcer of sacral region, stage 3 G82.22 - Paraplegia, incomplete L89.322 - Pressure ulcer of left buttock, stage 2 Gibbon, Maikel ([id number]) Procedures Wound #1 Wound #1 is a Pressure Ulcer located on the Left Coccyx . There was a Skin/Subcutaneous Tissue Debridement ([id number]) debridement with total area of 0.6 sq cm performed by Evlyn Kanner, MD. with the following instrument(s): Curette to remove Viable and Non-Viable tissue/material including Exudate, Fibrin/Slough, and Subcutaneous after achieving pain control using Lidocaine 4% Topical Solution. A time out was conducted at 13:21, prior to the start of the procedure. A Minimum amount of bleeding was controlled with  Pressure. The procedure was tolerated well with a pain level of 0 throughout and a pain level of 0 following the procedure. Post Debridement Measurements: 0.6cm length x 1cm width x 0.4cm depth; 0.188cm^3 volume. Post debridement Stage noted as Category/Stage III. Character of Wound/Ulcer Post Debridement requires further debridement. Severity of Tissue Post Debridement is: Fat layer exposed. Post procedure Diagnosis Wound #1: Same as Pre-Procedure Plan Wound Cleansing: Wound #1 Left Coccyx: Clean wound with Normal Saline. Anesthetic: Wound #1 Left Coccyx: Topical Lidocaine 4% cream applied to wound bed prior to debridement Skin Barriers/Peri-Wound Care: Wound #1 Left Coccyx: Skin Prep Primary Wound Dressing: Wound #1 Left Coccyx: Prisma Ag - moisten with saline Wound #2 Left Gluteus: Tegaderm Foam Secondary Dressing: Wound #1 Left Coccyx: Foam Tegaderm Wound #2 Left Gluteus: Tegaderm Dressing Change Frequency: Wound #1 Left Coccyx: Change dressing every day. Follow-up Appointments: NYMIR, RINGLER ([id number]) Wound #1 Left Coccyx: Return Appointment in 2 weeks. Off-Loading: Wound #1 Left Coccyx: Turn and reposition every 2 hours Additional Orders / Instructions: Wound #1 Left Coccyx: Increase protein intake. Medications-please add to medication list.: Wound #1 Left Coccyx: Other: - Vitamin C, A and Zinc After aggressive debridement I have recommended silver collagen to be changed every other day with a bordered foam. I rest offloading and appropriate protein and vitamin supplements. For his new wound on the left initial tuberosity I have recommended a bordered foam and constant protection and offloading. Electronic Signature(s) Signed: 02/25/2016 1:29:35 PM By: Evlyn Kanner MD, FACS Entered By: Evlyn Kanner on 02/25/2016 13:29:35 HERSHAL, ERIKSSON ([id number]) -------------------------------------------------------------------------------- [employer]  Details Patient Name: Matthew Evangelist Date of Service: 02/25/2016 Medical Record Number: [id number] Patient Account Number: [id number] Date of Birth/Sex: August 04, 1970 (46 y.o. Male) Treating RN: Phillis Haggis Primary Care Physician: PATIENT, NO Other Clinician: Referring Physician: Treating Physician/Extender: Rudene [name] in Treatment: 27 Diagnosis Coding ICD-10 Codes Code Description L89.153 Pressure ulcer of sacral region, stage 3 G82.22 Paraplegia, incomplete L89.322 Pressure ulcer of left buttock, stage 2 Facility Procedures CPT4 Code: [id number] Description: 11042 - DEB SUBQ TISSUE 20 SQ CM/< ICD-10 Description Diagnosis L89.153 Pressure ulcer of sacral region, stage 3 G82.22  Paraplegia, incomplete L89.322 Pressure ulcer of left buttock, stage 2 Modifier: Quantity: 1 Physician Procedures CPT4 Code: 1610960 Description: 11042 - WC PHYS SUBQ TISS 20 SQ CM ICD-10 Description Diagnosis L89.153 Pressure ulcer of sacral region, stage 3 G82.22 Paraplegia, incomplete L89.322 Pressure ulcer of left buttock, stage 2 Modifier: Quantity: 1 Electronic Signature(s) Signed: 02/25/2016 1:29:45 PM By: Evlyn Kanner MD, FACS Entered By: Evlyn Kanner on 02/25/2016 13:29:45

## 2016-03-03 ENCOUNTER — Ambulatory Visit (INDEPENDENT_AMBULATORY_CARE_PROVIDER_SITE_OTHER): Payer: Medicare Other | Admitting: Psychology

## 2016-03-03 DIAGNOSIS — F3132 Bipolar disorder, current episode depressed, moderate: Secondary | ICD-10-CM | POA: Diagnosis not present

## 2016-03-06 ENCOUNTER — Encounter: Payer: Medicare Other | Admitting: Surgery

## 2016-03-06 DIAGNOSIS — F17210 Nicotine dependence, cigarettes, uncomplicated: Secondary | ICD-10-CM | POA: Diagnosis not present

## 2016-03-06 DIAGNOSIS — L89323 Pressure ulcer of left buttock, stage 3: Secondary | ICD-10-CM | POA: Diagnosis not present

## 2016-03-06 DIAGNOSIS — Z8249 Family history of ischemic heart disease and other diseases of the circulatory system: Secondary | ICD-10-CM | POA: Diagnosis not present

## 2016-03-06 DIAGNOSIS — L89153 Pressure ulcer of sacral region, stage 3: Secondary | ICD-10-CM | POA: Diagnosis not present

## 2016-03-06 DIAGNOSIS — G8222 Paraplegia, incomplete: Secondary | ICD-10-CM | POA: Diagnosis not present

## 2016-03-06 DIAGNOSIS — L89322 Pressure ulcer of left buttock, stage 2: Secondary | ICD-10-CM | POA: Diagnosis not present

## 2016-03-07 NOTE — Progress Notes (Signed)
[name], [name] ([id number]) Visit Report for 03/06/2016 Arrival Information Details Patient Name: [name], [name] Date of Service: 03/06/2016 12:45 PM Medical Record Number: [id number] Patient Account Number: [id number] Date of Birth/Sex: 01-Nov-1970 [age] y.o. Male) Treating RN: Phillis HaggisPinkerton, Debi Primary Care Physician: PATIENT, NO Other Clinician: Referring Physician: Treating Physician/Extender: Rudene ReBritto, Errol Weeks in Treatment: 28 Visit Information History Since Last Visit All ordered tests and consults were completed: No Patient Arrived: Wheel Chair Added or deleted any medications: No Arrival Time: 13:05 Any new allergies or adverse reactions: No Accompanied By: self Had a fall or experienced change in No Transfer Assistance: [employer] activities of daily living that may affect Patient Lift risk of falls: Patient Identification Verified: Yes Signs or symptoms of abuse/neglect since last No Secondary Verification Process Yes visito Completed: Hospitalized since last visit: No Patient Requires Transmission- No Pain Present Now: No Based Precautions: Patient Has Alerts: No Electronic Signature(s) Signed: 03/06/2016 5:02:43 PM By: Alejandro MullingPinkerton, Debra Entered By: Alejandro MullingPinkerton, Debra on 03/06/2016 13:06:00 [name], [name] ([id number]) -------------------------------------------------------------------------------- Clinic Level of Care Assessment Details Patient Name: [name], [name] Date of Service: 03/06/2016 12:45 PM Medical Record Number: [id number] Patient Account Number: [id number] Date of Birth/Sex: 01-Nov-1970 [age] y.o. Male) Treating RN: Phillis HaggisPinkerton, Debi Primary Care Physician: PATIENT, NO Other Clinician: Referring Physician: Treating Physician/Extender: Rudene ReBritto, Errol Weeks in Treatment: 28 Clinic Level of Care Assessment Items TOOL 4 Quantity Score X - Use when only an EandM is performed on FOLLOW-UP visit 1 0 ASSESSMENTS - Nursing Assessment / Reassessment X -  Reassessment of Co-morbidities (includes updates in patient status) 1 10 X - Reassessment of Adherence to Treatment Plan 1 5 ASSESSMENTS - Wound and Skin Assessment / Reassessment X - Simple Wound Assessment / Reassessment - one wound 1 5 []  - Complex Wound Assessment / Reassessment - multiple wounds 0 []  - Dermatologic / Skin Assessment (not related to wound area) 0 ASSESSMENTS - Focused Assessment []  - Circumferential Edema Measurements - multi extremities 0 []  - Nutritional Assessment / Counseling / Intervention 0 []  - Lower Extremity Assessment (monofilament, tuning fork, pulses) 0 []  - Peripheral Arterial Disease Assessment (using hand held doppler) 0 ASSESSMENTS - Ostomy and/or Continence Assessment and Care []  - Incontinence Assessment and Management 0 []  - Ostomy Care Assessment and Management (repouching, etc.) 0 PROCESS - Coordination of Care X - Simple Patient / Family Education for ongoing care 1 15 []  - Complex (extensive) Patient / Family Education for ongoing care 0 X - Staff obtains ChiropractorConsents, Records, Test Results / Process Orders 1 10 []  - Staff telephones HHA, Nursing Homes / Clarify orders / etc 0 []  - Routine Transfer to another Facility (non-emergent condition) 0 [name], [name] ([id number]) []  - Routine Hospital Admission (non-emergent condition) 0 []  - New Admissions / Manufacturing engineernsurance Authorizations / Ordering NPWT, Apligraf, etc. 0 []  - Emergency Hospital Admission (emergent condition) 0 []  - Simple Discharge Coordination 0 []  - Complex (extensive) Discharge Coordination 0 PROCESS - Special Needs []  - Pediatric / Minor Patient Management 0 []  - Isolation Patient Management 0 []  - Hearing / Language / Visual special needs 0 []  - Assessment of Community assistance (transportation, D/C planning, etc.) 0 []  - Additional assistance / Altered mentation 0 []  - Support Surface(s) Assessment (bed, cushion, seat, etc.) 0 INTERVENTIONS - Wound Cleansing / Measurement X -  Simple Wound Cleansing - one wound 1 5 []  - Complex Wound Cleansing - multiple wounds 0 X - Wound Imaging (photographs - any number of wounds) 1 5 []  - Wound Tracing (instead of  photographs) 0 X - Simple Wound Measurement - one wound 1 5 []  - Complex Wound Measurement - multiple wounds 0 INTERVENTIONS - Wound Dressings X - Small Wound Dressing one or multiple wounds 1 10 []  - Medium Wound Dressing one or multiple wounds 0 []  - Large Wound Dressing one or multiple wounds 0 X - Application of Medications - topical 1 5 []  - Application of Medications - injection 0 INTERVENTIONS - Miscellaneous []  - External ear exam 0 [name], [name] ([id number]) []  - Specimen Collection (cultures, biopsies, blood, body fluids, etc.) 0 []  - Specimen(s) / Culture(s) sent or taken to Lab for analysis 0 []  - Patient Transfer (multiple staff / Michiel SitesHoyer Lift / Similar devices) 0 []  - Simple Staple / Suture removal (25 or less) 0 []  - Complex Staple / Suture removal (26 or more) 0 []  - Hypo / Hyperglycemic Management (close monitor of Blood Glucose) 0 []  - Ankle / Brachial Index (ABI) - do not check if billed separately 0 X - Vital Signs 1 5 Has the patient been seen at the hospital within the last three years: Yes Total Score: 80 Level Of Care: New/Established - Level 3 Electronic Signature(s) Signed: 03/06/2016 5:02:43 PM By: Alejandro MullingPinkerton, Debra Entered By: Alejandro MullingPinkerton, Debra on 03/06/2016 16:46:45 [name], [name] ([id number]) -------------------------------------------------------------------------------- Encounter Discharge Information Details Patient Name: [name], [name] Date of Service: 03/06/2016 12:45 PM Medical Record Number: [id number] Patient Account Number: [id number] Date of Birth/Sex: 20-Oct-1970 [age] y.o. Male) Treating RN: Phillis HaggisPinkerton, Debi Primary Care Physician: PATIENT, NO Other Clinician: Referring Physician: Treating Physician/Extender: Rudene ReBritto, Errol Weeks in Treatment: 28 Encounter Discharge  Information Items Discharge Pain Level: 0 Discharge Condition: Stable Ambulatory Status: Wheelchair Discharge Destination: Home Transportation: Private Auto Accompanied By: self Schedule Follow-up Appointment: Yes Medication Reconciliation completed and provided to Patient/Care Yes Ariea Rochin: Provided on Clinical Summary of Care: 03/06/2016 Form Type Recipient Paper Patient [facility] Electronic Signature(s) Signed: 03/06/2016 1:29:06 PM By: Gwenlyn PerkingMoore, Shelia Entered By: Gwenlyn PerkingMoore, Shelia on 03/06/2016 13:29:05 [name], Plato ([id number]) -------------------------------------------------------------------------------- Lower Extremity Assessment Details Patient Name: [name], Patty Date of Service: 03/06/2016 12:45 PM Medical Record Number: [id number] Patient Account Number: [id number] Date of Birth/Sex: 20-Oct-1970 [age] y.o. Male) Treating RN: Phillis HaggisPinkerton, Debi Primary Care Physician: PATIENT, NO Other Clinician: Referring Physician: Treating Physician/Extender: Rudene ReBritto, Errol Weeks in Treatment: 28 Electronic Signature(s) Signed: 03/06/2016 5:02:43 PM By: Alejandro MullingPinkerton, Debra Entered By: Alejandro MullingPinkerton, Debra on 03/06/2016 13:07:44 [name], Keevon ([id number]) -------------------------------------------------------------------------------- Multi Wound Chart Details Patient Name: [name], Keon Date of Service: 03/06/2016 12:45 PM Medical Record Number: [id number] Patient Account Number: [id number] Date of Birth/Sex: 20-Oct-1970 [age] y.o. Male) Treating RN: Phillis HaggisPinkerton, Debi Primary Care Physician: PATIENT, NO Other Clinician: Referring Physician: Treating Physician/Extender: Rudene ReBritto, Errol Weeks in Treatment: 28 Vital Signs Height(in): 67 Pulse(bpm): 88 Weight(lbs): 147 Blood Pressure 120/62 (mmHg): Body Mass Index(BMI): 23 Temperature(F): 97.7 Respiratory Rate 18 (breaths/min): Photos: [1:No Photos] [2:No Photos] [N/A:N/A] Wound Location: [1:Left Coccyx] [2:Left Gluteus] [N/A:N/A] Wounding  Event: [1:Pressure Injury] [2:Shear/Friction] [N/A:N/A] Primary Etiology: [1:Pressure Ulcer] [2:Skin Tear] [N/A:N/A] Comorbid History: [1:Paraplegia] [2:Paraplegia] [N/A:N/A] Date Acquired: [1:12/24/2014] [2:02/11/2016] [N/A:N/A] Weeks of Treatment: [1:28] [2:2] [N/A:N/A] Wound Status: [1:Open] [2:Open] [N/A:N/A] Measurements L x W x D 0.3x1x0.4 [2:0x0x0] [N/A:N/A] (cm) Area (cm) : [1:0.236] [2:0] [N/A:N/A] Volume (cm) : [1:0.094] [2:0] [N/A:N/A] % Reduction in Area: [1:77.70%] [2:100.00%] [N/A:N/A] % Reduction in Volume: 82.30% [2:100.00%] [N/A:N/A] Classification: [1:Category/Stage III] [2:Partial Thickness] [N/A:N/A] Exudate Amount: [1:Large] [2:None Present] [N/A:N/A] Exudate Type: [1:Serous] [2:N/A] [N/A:N/A] Exudate Color: [1:amber] [2:N/A] [N/A:N/A] Foul Odor After [1:Yes] [2:No] [N/A:N/A] Cleansing: Odor Anticipated Due to  No [2:N/A] [N/A:N/A] Product Use: Wound Margin: [1:Thickened] [2:Flat and Intact] [N/A:N/A] Granulation Amount: [1:Medium (34-66%)] [2:None Present (0%)] [N/A:N/A] Granulation Quality: [1:Pink] [2:N/A] [N/A:N/A] Necrotic Amount: [1:Medium (34-66%)] [2:None Present (0%)] [N/A:N/A] Necrotic Tissue: [1:Eschar, Adherent Slough] [2:N/A] [N/A:N/A] Exposed Structures: [1:Fat: Yes Fascia: No] [2:Fascia: No Fat: No] [N/A:N/A] Tendon: No Tendon: No Muscle: No Muscle: No Joint: No Joint: No Bone: No Bone: No Limited to Skin Breakdown Epithelialization: None Large (67-100%) N/A Periwound Skin Texture: Callus: Yes No Abnormalities Noted N/A Periwound Skin Maceration: Yes Moist: No N/A Moisture: Moist: Yes Dry/Scaly: No Periwound Skin Color: No Abnormalities Noted No Abnormalities Noted N/A Temperature: No Abnormality No Abnormality N/A Tenderness on No Yes N/A Palpation: Wound Preparation: Ulcer Cleansing: Ulcer Cleansing: N/A Rinsed/Irrigated with Rinsed/Irrigated with Saline Saline Topical Anesthetic Topical Anesthetic Applied: Other: lidocaine  Applied: None 4% Treatment Notes Electronic Signature(s) Signed: 03/06/2016 5:02:43 PM By: Alejandro Mulling Entered By: Alejandro Mulling on 03/06/2016 13:18:05 Matthew Evangelist ([id number]) -------------------------------------------------------------------------------- Multi-Disciplinary Care Plan Details Patient Name: Matthew Evangelist Date of Service: 03/06/2016 12:45 PM Medical Record Number: [id number] Patient Account Number: [id number] Date of Birth/Sex: 1971/01/11 (45 y.o. Male) Treating RN: Phillis Haggis Primary Care Physician: PATIENT, NO Other Clinician: Referring Physician: Treating Physician/Extender: Rudene Re in Treatment: 28 Active Inactive Abuse / Safety / Falls / Self Care Management Nursing Diagnoses: Potential for falls Goals: Patient will remain injury free Date Initiated: 08/17/2015 Goal Status: Active Interventions: Assess fall risk on admission and as needed Notes: Nutrition Nursing Diagnoses: Imbalanced nutrition Goals: Patient/caregiver agrees to and verbalizes understanding of need to use nutritional supplements and/or vitamins as prescribed Date Initiated: 08/17/2015 Goal Status: Active Interventions: Assess patient nutrition upon admission and as needed per policy Notes: Orientation to the Wound Care Program Nursing Diagnoses: Knowledge deficit related to the wound healing center program Goals: Patient/caregiver will verbalize understanding of the Wound Healing Center Program BOSTYN, BOGIE ([id number]) Date Initiated: 08/17/2015 Goal Status: Active Interventions: Provide education on orientation to the wound center Notes: Pressure Nursing Diagnoses: Knowledge deficit related to causes and risk factors for pressure ulcer development Goals: Patient will remain free from development of additional pressure ulcers Date Initiated: 08/17/2015 Goal Status: Active Interventions: Assess: immobility, friction, shearing, incontinence upon  admission and as needed Notes: Wound/Skin Impairment Nursing Diagnoses: Impaired tissue integrity Goals: Ulcer/skin breakdown will have a volume reduction of 30% by week 4 Date Initiated: 08/17/2015 Goal Status: Active Ulcer/skin breakdown will have a volume reduction of 50% by week 8 Date Initiated: 08/17/2015 Goal Status: Active Ulcer/skin breakdown will have a volume reduction of 80% by week 12 Date Initiated: 08/17/2015 Goal Status: Active Interventions: Assess patient/caregiver ability to obtain necessary supplies Assess ulceration(s) every visit Notes: Electronic Signature(s) Signed: 03/06/2016 5:02:43 PM By: Servando Snare, Leonette Most ([id number]) Entered By: Alejandro Mulling on 03/06/2016 13:17:59 Matthew Evangelist ([id number]) -------------------------------------------------------------------------------- Pain Assessment Details Patient Name: Matthew Evangelist Date of Service: 03/06/2016 12:45 PM Medical Record Number: [id number] Patient Account Number: [id number] Date of Birth/Sex: [birth year] (45 y.o. Male) Treating RN: Phillis Haggis Primary Care Physician: PATIENT, NO Other Clinician: Referring Physician: Treating Physician/Extender: Rudene Re in Treatment: 28 Active Problems Location of Pain Severity and Description of Pain Patient Has Paino No Site Locations With Dressing Change: No Pain Management and Medication Current Pain Management: Electronic Signature(s) Signed: 03/06/2016 5:02:43 PM By: Alejandro Mulling Entered By: Alejandro Mulling on 03/06/2016 13:06:05 Matthew Evangelist ([id number]) -------------------------------------------------------------------------------- Patient/Caregiver Education Details Patient Name: Matthew Evangelist Date of Service: 03/06/2016 12:45 PM Medical Record Number: [id number] Patient Account Number: [id number]  Date of Birth/Gender: 1971/01/12 (45 y.o. Male) Treating RN: Ashok Cordia, Debi Primary Care Physician:  PATIENT, NO Other Clinician: Referring Physician: Treating Physician/Extender: Rudene Re in Treatment: 28 Education Assessment Education Provided To: Patient Education Topics Provided Wound/Skin Impairment: Handouts: Other: change dressing as ordered Methods: Demonstration, Explain/Verbal Responses: State content correctly Electronic Signature(s) Signed: 03/06/2016 5:02:43 PM By: Alejandro Mulling Entered By: Alejandro Mulling on 03/06/2016 13:29:02 Matthew Evangelist ([id number]) -------------------------------------------------------------------------------- Wound Assessment Details Patient Name: Matthew Evangelist Date of Service: 03/06/2016 12:45 PM Medical Record Number: [id number] Patient Account Number: [id number] Date of Birth/Sex: [birth year] (45 y.o. Male) Treating RN: Phillis Haggis Primary Care Physician: PATIENT, NO Other Clinician: Referring Physician: Treating Physician/Extender: Rudene Re in Treatment: 28 Wound Status Wound Number: 1 Primary Etiology: Pressure Ulcer Wound Location: Left Coccyx Wound Status: Open Wounding Event: Pressure Injury Comorbid History: Paraplegia Date Acquired: 12/24/2014 Weeks Of Treatment: 28 Clustered Wound: No Photos Photo Uploaded By: Alejandro Mulling on 03/06/2016 16:50:16 Wound Measurements Length: (cm) 0.3 Width: (cm) 1 Depth: (cm) 0.4 Area: (cm) 0.236 Volume: (cm) 0.094 % Reduction in Area: 77.7% % Reduction in Volume: 82.3% Epithelialization: None Tunneling: No Undermining: No Wound Description Classification: Category/Stage III Foul Odor Aft Wound Margin: Thickened Due to Produc Exudate Amount: Large Exudate Type: Serous Exudate Color: amber er Cleansing: Yes t Use: No Wound Bed Granulation Amount: Medium (34-66%) Exposed Structure Granulation Quality: Pink Fascia Exposed: No Necrotic Amount: Medium (34-66%) Fat Layer Exposed: Yes Necrotic Quality: Eschar, Adherent Slough Tendon  Exposed: No ROARKE, MARCIANO ([id number]) Muscle Exposed: No Joint Exposed: No Bone Exposed: No Periwound Skin Texture Texture Color No Abnormalities Noted: No No Abnormalities Noted: No Callus: Yes Temperature / Pain Moisture Temperature: No Abnormality No Abnormalities Noted: No Dry / Scaly: No Maceration: Yes Moist: Yes Wound Preparation Ulcer Cleansing: Rinsed/Irrigated with Saline Topical Anesthetic Applied: Other: lidocaine 4%, Treatment Notes Wound #1 (Left Coccyx) 1. Cleansed with: Clean wound with Normal Saline 2. Anesthetic Topical Lidocaine 4% cream to wound bed prior to debridement 3. Peri-wound Care: Skin Prep 4. Dressing Applied: Prisma Ag 5. Secondary Dressing Applied Foam Tegaderm Electronic Signature(s) Signed: 03/06/2016 5:02:43 PM By: Alejandro Mulling Entered By: Alejandro Mulling on 03/06/2016 13:14:03 Matthew Evangelist ([id number]) -------------------------------------------------------------------------------- Wound Assessment Details Patient Name: Matthew Evangelist Date of Service: 03/06/2016 12:45 PM Medical Record Number: [id number] Patient Account Number: [id number] Date of Birth/Sex: [birth year] (45 y.o. Male) Treating RN: Phillis Haggis Primary Care Physician: PATIENT, NO Other Clinician: Referring Physician: Treating Physician/Extender: Rudene Re in Treatment: 28 Wound Status Wound Number: 2 Primary Etiology: Skin Tear Wound Location: Left Gluteus Wound Status: Open Wounding Event: Shear/Friction Comorbid History: Paraplegia Date Acquired: 02/11/2016 Weeks Of Treatment: 2 Clustered Wound: No Photos Photo Uploaded By: Alejandro Mulling on 03/06/2016 16:50:40 Wound Measurements Length: (cm) 0 % Reduction in Width: (cm) 0 % Reduction in Depth: (cm) 0 Epithelializat Area: (cm) 0 Tunneling: Volume: (cm) 0 Undermining: Area: 100% Volume: 100% ion: Large (67-100%) No No Wound Description Classification: Partial  Thickness Wound Margin: Flat and Intact Exudate Amount: None Present Foul Odor After Cleansing: No Wound Bed Granulation Amount: None Present (0%) Exposed Structure Necrotic Amount: None Present (0%) Fascia Exposed: No Fat Layer Exposed: No Tendon Exposed: No Muscle Exposed: No Joint Exposed: No MEAD, SLANE ([id number]) Bone Exposed: No Limited to Skin Breakdown Periwound Skin Texture Texture Color No Abnormalities Noted: No No Abnormalities Noted: No Moisture Temperature / Pain No Abnormalities Noted: No Temperature: No Abnormality Moist: No Tenderness on Palpation: Yes Wound Preparation Ulcer Cleansing: Rinsed/Irrigated with Saline  Topical Anesthetic Applied: None Electronic Signature(s) Signed: 03/06/2016 5:02:43 PM By: Alejandro Mulling Entered By: Alejandro Mulling on 03/06/2016 13:17:45 TORBEN, SOLOWAY ([id number]) -------------------------------------------------------------------------------- Vitals Details Patient Name: Matthew Evangelist Date of Service: 03/06/2016 12:45 PM Medical Record Number: [id number] Patient Account Number: [id number] Date of Birth/Sex: 12-11-1970 (45 y.o. Male) Treating RN: Phillis Haggis Primary Care Physician: PATIENT, NO Other Clinician: Referring Physician: Treating Physician/Extender: Rudene Re in Treatment: 28 Vital Signs Time Taken: 13:06 Temperature (F): 97.7 Height (in): 67 Pulse (bpm): 88 Weight (lbs): 147 Respiratory Rate (breaths/min): 18 Body Mass Index (BMI): 23 Blood Pressure (mmHg): 120/62 Reference Range: 80 - 120 mg / dl Electronic Signature(s) Signed: 03/06/2016 5:02:43 PM By: Alejandro Mulling Entered By: Alejandro Mulling on 03/06/2016 13:06:50

## 2016-03-07 NOTE — Progress Notes (Signed)
[name], Farzad ([id number]) Visit Report for 03/06/2016 Chief Complaint Document Details Patient Name: [name], [name] Date of Service: 03/06/2016 12:45 PM Medical Record Number: [id number] Patient Account Number: [id number] Date of Birth/Sex: July 18, 1970 [age] y.o. Male) Treating RN: Phillis HaggisPinkerton, Debi Primary Care Physician: PATIENT, NO Other Clinician: Referring Physician: Treating Physician/Extender: Rudene ReBritto, Sheryn Aldaz Weeks in Treatment: 28 Information Obtained from: Patient Chief Complaint 08/17/15; the patient is recently really relocated [place] [facility] [place]. He is here to establish a wound care for a stage III ulcer on his lower sacral area. Electronic Signature(s) Signed: 03/06/2016 1:21:53 PM By: Evlyn KannerBritto, Lukka Black MD, FACS Entered By: Evlyn KannerBritto, Fenris Cauble on 03/06/2016 13:21:53 [name], Tagen ([id number]) -------------------------------------------------------------------------------- HPI Details Patient Name: [name], [name] Date of Service: 03/06/2016 12:45 PM Medical Record Number: [id number] Patient Account Number: [id number] Date of Birth/Sex: July 18, 1970 [age] y.o. 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 He was diagnosed with what sounds like a stage III pressure ulcer in September 2016 although this may have been present for some time longer than that perhaps July 2016. [place] By his description there were using Silvadene cream for a period of time and then 3 months ago change to Aquacel Ag which she is continued and changes daily with help from his mother. He has recently [place] [facility] [place]. He went to urgent care  to follow-up on his wound on 08/04/2015 they did a culture of this wound that showed Pseudomonas and MSSA. He was placed on an antibiotic but doesn't remember which one it is. He has continued using Aquacel Ag and a foam based cover. I don't have any of his records from the wound care center [place] although the patient states he did have an MRI that did not show osteomyelitis. He is not aware of having a bone culture done and doesn't think that he had exposed bone on this and any point. The patient does in and out cath's and has no urinary incontinence issues. He is on appropriate bowel regimen which she administers suppositories. He is able to position himself appropriately and is wheelchair to try and avoid pressure over this wound 08/24/15; small horizontal wound over his lower sacral area. Requires debridement of surface slough and nonviable subcutaneous tissue. There is a small amount of undermining noted no evidence of infection. On the right side of this wound there is some senescent rolled edges which may need debridement I did not do this today. 09/01/15 this is a small wound over the lower sacral area. It has tunneling superiorly. This goes roughly 1 cm. Nonviable subcutaneous tissue at the inferior aspect of the wound base. No real evidence of infection 09/07/15; small horizontal wound over the lower sacral area. Last week it had 1 cm of superior tunneling which appears to of resolved. No evidence of infection 09/21/15; continues to have a small horizontal wound over the lower sacral area. We have been using Prisma and this has contracted. He tells me that his mother is aware medication not able to change the dressing although he states that he is able to do this 09/28/15; a small horizontal wound over his lower sacral area is all that is left of this wound which was probably much larger and deeper  at one point. Depth today at 0.7 cm. Base of this had some nonviable subcutaneous  tissue that I removed. Appears to be stalling on current dressing which is Prisma 10/19/15; I have not seen this patient in several weeks. He arrives today with a linear/horizontal wound across his lower sacral area. Considerable depth of this wound. Extensive debridement done 11/17/15; patient arrives for two-week visit. He is not doing well wound is bigger with odor and drainage. Has been using Aquacel Ag. I have not seen this in over a month although I think he was seen by the physician covering me 2 weeks ago on 11/05/15 11/24/15; I brought this man back this week to review the wound after last week's deterioration. Culture grew Enterobacter and some staph aureus [MSSA]. I had ordered him Cipro but we don't seem to of been able to communicate with the patient therefore we've called in today. He is not been systemically unwell. Still using Aquacel 12/15/15; very little change in this wound since the last time I saw it. Horizontal wound over the lower sacral Matthew, Frank ([id number]) area probably about 2 cm in width and 0.8 cm in depth. There is no evidence of infection. There appears to be callus around this. In spite of the patient's best efforts I think there is an adequate pressure relief here. No current evidence of infection 12/22/15; horizontal pressure ulcer over the lower sacral area. Appears to be filling in and has better dimensions in appearance today. Still circumferential callus which would suggest inadequate offloading although everything else appears to be improved 12/29/15 horizontal pressure wound over the lower sacral area. Appears to be filling in however skin groin down into the divot. No evidence of infection 01/05/16; comes in today with not much change in this wound. There is surrounding callus indicative of pressure. Not much change in the wound bed. This clearly require debridement today we have been using Hydrofera Blue 02/04/16: returns today after an absence. no  systemic s/s of infection. once again, periwound callus is present suggesting pressure. 02/18/2016 -- she was away for 2 weeks traveling to [place] and during that time he had a new pressure injury to the left ischial tuberosity which he thinks may have been caused while transferring. Electronic Signature(s) Signed: 03/06/2016 1:21:57 PM By: Evlyn Kanner MD, FACS Entered By: Evlyn Kanner on 03/06/2016 13:21:57 ARIAN, MURLEY ([id number]) -------------------------------------------------------------------------------- Physical Exam Details Patient Name: [name] Date of Service: 03/06/2016 12:45 PM Medical Record Number: [id number] Patient Account Number: [id number] Date of Birth/Sex: [birth year] (45 y.o. Male) Treating RN: Phillis Haggis Primary Care Physician: PATIENT, NO Other Clinician: Referring Physician: Treating Physician/Extender: Rudene Re in Treatment: 28 Constitutional . Pulse regular. Respirations normal and unlabored. Afebrile. . Eyes Nonicteric. Reactive to light. Ears, Nose, Mouth, and Throat Lips, teeth, and gums WNL.Marland Kitchen Moist mucosa without lesions. Neck supple and nontender. No palpable supraclavicular or cervical adenopathy. Normal sized without goiter. Respiratory WNL. No retractions.. Cardiovascular Pedal Pulses WNL. No clubbing, cyanosis or edema. Lymphatic No adneopathy. No adenopathy. No adenopathy. Musculoskeletal Adexa without tenderness or enlargement.. Digits and nails w/o clubbing, cyanosis, infection, petechiae, ischemia, or inflammatory conditions.. Integumentary (Hair, Skin) No suspicious lesions. No crepitus or fluctuance. No peri-wound warmth or erythema. No masses.Marland Kitchen Psychiatric Judgement and insight Intact.. No evidence of depression, anxiety, or agitation.. Notes the wound is looking much better and no sharp debridement was required today. I washed out the depths of it with moist saline gauze and a Q-tip and there is healthy  granulation  tissue. The edges have minimal callus. The new wound on the left buttock is completely healed. Electronic Signature(s) Signed: 03/06/2016 1:22:35 PM By: Evlyn Kanner MD, FACS Entered By: Evlyn Kanner on 03/06/2016 13:22:35 MIECZYSLAW, STAMAS ([id number]) -------------------------------------------------------------------------------- Physician Orders Details Patient Name: [name] Date of Service: 03/06/2016 12:45 PM Medical Record Number: [id number] Patient Account Number: [id number] Date of Birth/Sex: 12/09/1970 (45 y.o. Male) Treating RN: Phillis Haggis Primary Care Physician: PATIENT, NO Other Clinician: Referring Physician: Treating Physician/Extender: Rudene Re in Treatment: 15 Verbal / Phone Orders: Yes Clinician: Ashok Cordia, Debi Read Back and Verified: Yes Diagnosis Coding Wound Cleansing Wound #1 Left Coccyx o Clean wound with Normal Saline. Anesthetic Wound #1 Left Coccyx o Topical Lidocaine 4% cream applied to wound bed prior to debridement Skin Barriers/Peri-Wound Care Wound #1 Left Coccyx o Skin Prep Primary Wound Dressing Wound #1 Left Coccyx o Prisma Ag - moisten with saline Secondary Dressing Wound #1 Left Coccyx o Foam o Tegaderm Dressing Change Frequency Wound #1 Left Coccyx o Change dressing every day. Follow-up Appointments Wound #1 Left Coccyx o Return Appointment in 2 weeks. Off-Loading Wound #1 Left Coccyx o Turn and reposition every 2 hours Additional Orders / Instructions FAUSTO, SAMPEDRO ([id number]) Wound #1 Left Coccyx o Increase protein intake. Medications-please add to medication list. Wound #1 Left Coccyx o Other: - Vitamin C, A and Zinc Electronic Signature(s) Signed: 03/06/2016 4:28:36 PM By: Evlyn Kanner MD, FACS Signed: 03/06/2016 5:02:43 PM By: Alejandro Mulling Entered By: Alejandro Mulling on 03/06/2016 13:18:50 GIANFRANCO, ARAKI  ([id number]) -------------------------------------------------------------------------------- Problem List Details Patient Name: [name] Date of Service: 03/06/2016 12:45 PM Medical Record Number: [id number] Patient Account Number: [id number] Date of Birth/Sex: 1970-10-24 (45 y.o. Male) Treating RN: Phillis Haggis Primary Care Physician: PATIENT, NO Other Clinician: Referring Physician: Treating Physician/Extender: Rudene Re in Treatment: 28 Active Problems ICD-10 Encounter Code Description Active Date Diagnosis L89.153 Pressure ulcer of sacral region, stage 3 08/17/2015 Yes G82.22 Paraplegia, incomplete 08/17/2015 Yes L89.322 Pressure ulcer of left buttock, stage 2 02/18/2016 Yes Inactive Problems Resolved Problems Electronic Signature(s) Signed: 03/06/2016 1:21:48 PM By: Evlyn Kanner MD, FACS Entered By: Evlyn Kanner on 03/06/2016 13:21:48 CATON, POPOWSKI ([id number]) -------------------------------------------------------------------------------- Progress Note Details Patient Name: [name] Date of Service: 03/06/2016 12:45 PM Medical Record Number: [id number] Patient Account Number: [id number] Date of Birth/Sex: Nov 19, 1970 (45 y.o. Male) Treating RN: Phillis Haggis Primary Care Physician: PATIENT, NO Other Clinician: Referring Physician: Treating Physician/Extender: Rudene Re in Treatment: 28 Subjective Chief Complaint Information obtained from Patient 08/17/15; the patient is recently really relocated from [facility]. He is here to establish a wound care for a stage III ulcer on his lower sacral area. 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 He was diagnosed with what sounds like a stage III pressure ulcer in September 2016 although this may have been present for some time longer than that perhaps July  2016. He was followed by wound care in [place]. By his description there were using Silvadene cream for a period of time and then 3 months ago change to Aquacel Ag which she is continued and changes daily with help from his mother. He has recently relocated to [facility]. He went to urgent care to follow- up on his wound on 08/04/2015 they did a culture of this wound that showed Pseudomonas and MSSA. He was placed on an antibiotic but doesn't remember  which one it is. He has continued using Aquacel Ag and a foam based cover. I don't have any of his records from the wound care center in [place] although the patient states he did have an MRI that did not show osteomyelitis. He is not aware of having a bone culture done and doesn't think that he had exposed bone on this and any point. The patient does in and out cath's and has no urinary incontinence issues. He is on appropriate bowel regimen which she administers suppositories. He is able to position himself appropriately and is wheelchair to try and avoid pressure over this wound 08/24/15; small horizontal wound over his lower sacral area. Requires debridement of surface slough and nonviable subcutaneous tissue. There is a small amount of undermining noted no evidence of infection. On the right side of this wound there is some senescent rolled edges which may need debridement I did not do this today. 09/01/15 this is a small wound over the lower sacral area. It has tunneling superiorly. This goes roughly 1 cm. Nonviable subcutaneous tissue at the inferior aspect of the wound base. No real evidence of infection 09/07/15; small horizontal wound over the lower sacral area. Last week it had 1 cm of superior tunneling which appears to of resolved. No evidence of infection 09/21/15; continues to have a small horizontal wound over the lower sacral area. We have been using Prisma and this has contracted. He tells me that his mother is  aware medication not able to change the dressing although he states that he is able to do this 09/28/15; a small horizontal wound over his lower sacral area is all that is left of this wound which was probably much larger and deeper at one point. Depth today at 0.7 cm. Base of this had some nonviable subcutaneous tissue that I removed. Appears to be stalling on current dressing which is Prisma 10/19/15; I have not seen this patient in several weeks. He arrives today with a linear/horizontal wound across his lower sacral area. Considerable depth of this wound. Extensive debridement done 11/17/15; patient arrives for two-week visit. He is not doing well wound is bigger with odor and drainage. Has TOME, WILSON ([id number]) been using Aquacel Ag. I have not seen this in over a month although I think he was seen by the physician covering me 2 weeks ago on 11/05/15 11/24/15; I brought this man back this week to review the wound after last week's deterioration. Culture grew Enterobacter and some staph aureus [MSSA]. I had ordered him Cipro but we don't seem to of been able to communicate with the patient therefore we've called in today. He is not been systemically unwell. Still using Aquacel 12/15/15; very little change in this wound since the last time I saw it. Horizontal wound over the lower sacral area probably about 2 cm in width and 0.8 cm in depth. There is no evidence of infection. There appears to be callus around this. In spite of the patient's best efforts I think there is an adequate pressure relief here. No current evidence of infection 12/22/15; horizontal pressure ulcer over the lower sacral area. Appears to be filling in and has better dimensions in appearance today. Still circumferential callus which would suggest inadequate offloading although everything else appears to be improved 12/29/15 horizontal pressure wound over the lower sacral area. Appears to be filling in however skin groin down  into the divot. No evidence of infection 01/05/16; comes in today with not much change in this  wound. There is surrounding callus indicative of pressure. Not much change in the wound bed. This clearly require debridement today we have been using Hydrofera Blue 02/04/16: returns today after an absence. no systemic s/s of infection. once again, periwound callus is present suggesting pressure. 02/18/2016 -- she was away for 2 weeks traveling to [place] and during that time he had a new pressure injury to the left ischial tuberosity which he thinks may have been caused while transferring. Objective Constitutional Pulse regular. Respirations normal and unlabored. Afebrile. Vitals Time Taken: 1:06 PM, Height: 67 in, Weight: 147 lbs, BMI: 23, Temperature: 97.7 F, Pulse: 88 bpm, Respiratory Rate: 18 breaths/min, Blood Pressure: 120/62 mmHg. Eyes Nonicteric. Reactive to light. Ears, Nose, Mouth, and Throat Lips, teeth, and gums WNL.Marland Kitchen. Moist mucosa without lesions. Neck supple and nontender. No palpable supraclavicular or cervical adenopathy. Normal sized without goiter. Respiratory WNL. No retractions.. Cardiovascular Jian, Tyce ([id number]) Pedal Pulses WNL. No clubbing, cyanosis or edema. Lymphatic No adneopathy. No adenopathy. No adenopathy. Musculoskeletal Adexa without tenderness or enlargement.. Digits and nails w/o clubbing, cyanosis, infection, petechiae, ischemia, or inflammatory conditions.Marland Kitchen. Psychiatric Judgement and insight Intact.. No evidence of depression, anxiety, or agitation.. General Notes: the wound is looking much better and no sharp debridement was required today. I washed out the depths of it with moist saline gauze and a Q-tip and there is healthy granulation tissue. The edges have minimal callus. The new wound on the left buttock is completely healed. Integumentary (Hair, Skin) No suspicious lesions. No crepitus or fluctuance. No peri-wound warmth or erythema. No  masses.. Wound #1 status is Open. Original cause of wound was Pressure Injury. The wound is located on the Left Coccyx. The wound measures 0.3cm length x 1cm width x 0.4cm depth; 0.236cm^2 area and 0.094cm^3 volume. There is fat exposed. There is no tunneling or undermining noted. There is a large amount of serous drainage noted. The wound margin is thickened. There is medium (34-66%) pink granulation within the wound bed. There is a medium (34-66%) amount of necrotic tissue within the wound bed including Eschar and Adherent Slough. The periwound skin appearance exhibited: Callus, Maceration, Moist. The periwound skin appearance did not exhibit: Dry/Scaly. Periwound temperature was noted as No Abnormality. Wound #2 status is Open. Original cause of wound was Shear/Friction. The wound is located on the Left Gluteus. The wound measures 0cm length x 0cm width x 0cm depth; 0cm^2 area and 0cm^3 volume. The wound is limited to skin breakdown. There is no tunneling or undermining noted. There is a none present amount of drainage noted. The wound margin is flat and intact. There is no granulation within the wound bed. There is no necrotic tissue within the wound bed. The periwound skin appearance did not exhibit: Moist. Periwound temperature was noted as No Abnormality. The periwound has tenderness on palpation. Assessment Active Problems ICD-10 L89.153 - Pressure ulcer of sacral region, stage 3 G82.22 - Paraplegia, incomplete L89.322 - Pressure ulcer of left buttock, stage 2 Ramser, Hershel ([id number]) Plan Wound Cleansing: Wound #1 Left Coccyx: Clean wound with Normal Saline. Anesthetic: Wound #1 Left Coccyx: Topical Lidocaine 4% cream applied to wound bed prior to debridement Skin Barriers/Peri-Wound Care: Wound #1 Left Coccyx: Skin Prep Primary Wound Dressing: Wound #1 Left Coccyx: Prisma Ag - moisten with saline Secondary Dressing: Wound #1 Left Coccyx: Foam Tegaderm Dressing  Change Frequency: Wound #1 Left Coccyx: Change dressing every day. Follow-up Appointments: Wound #1 Left Coccyx: Return Appointment in 2 weeks. Off-Loading: Wound #1 Left Coccyx:  Turn and reposition every 2 hours Additional Orders / Instructions: Wound #1 Left Coccyx: Increase protein intake. Medications-please add to medication list.: Wound #1 Left Coccyx: Other: - Vitamin C, A and Zinc I have recommended silver collagen to be changed every other day with a bordered foam. I have discussed offloading and appropriate protein and vitamin supplements. For his new wound on the left initial tuberosity I have recommended a bordered foam and constant protection and offloading. ELMOR, KOST ([id number]) Electronic Signature(s) Signed: 03/06/2016 1:23:32 PM By: Evlyn Kanner MD, FACS Entered By: Evlyn Kanner on 03/06/2016 13:23:32 WAYLON, HERSHEY ([id number]) -------------------------------------------------------------------------------- SuperBill Details Patient Name: [name] Date of Service: 03/06/2016 Medical Record Number: [id number] Patient Account Number: [id number] Date of Birth/Sex: [birth year] (45 y.o. Male) Treating RN: Phillis Haggis Primary Care Physician: PATIENT, NO Other Clinician: Referring Physician: Treating Physician/Extender: Rudene Re in Treatment: 28 Diagnosis Coding ICD-10 Codes Code Description L89.153 Pressure ulcer of sacral region, stage 3 G82.22 Paraplegia, incomplete L89.322 Pressure ulcer of left buttock, stage 2 Facility Procedures CPT4 Code: 08657846 Description: 99213 - WOUND CARE VISIT-LEV 3 EST PT Modifier: Quantity: 1 Physician Procedures CPT4 Code: [id number] Description: 99213 - WC PHYS LEVEL 3 - EST PT ICD-10 Description Diagnosis L89.153 Pressure ulcer of sacral region, stage 3 G82.22 Paraplegia, incomplete L89.322 Pressure ulcer of left buttock, stage 2 Modifier: Quantity: 1 Electronic Signature(s) Signed: 03/06/2016  5:02:43 PM By: Alejandro Mulling Previous Signature: 03/06/2016 1:23:43 PM Version By: Evlyn Kanner MD, FACS Entered By: Alejandro Mulling on 03/06/2016 16:46:50

## 2016-03-09 ENCOUNTER — Encounter: Payer: Medicare Other | Attending: Physical Medicine & Rehabilitation | Admitting: Registered Nurse

## 2016-03-09 DIAGNOSIS — N319 Neuromuscular dysfunction of bladder, unspecified: Secondary | ICD-10-CM | POA: Insufficient documentation

## 2016-03-09 DIAGNOSIS — R252 Cramp and spasm: Secondary | ICD-10-CM | POA: Insufficient documentation

## 2016-03-09 DIAGNOSIS — G479 Sleep disorder, unspecified: Secondary | ICD-10-CM | POA: Insufficient documentation

## 2016-03-09 DIAGNOSIS — G8220 Paraplegia, unspecified: Secondary | ICD-10-CM | POA: Insufficient documentation

## 2016-03-09 DIAGNOSIS — Z5189 Encounter for other specified aftercare: Secondary | ICD-10-CM | POA: Insufficient documentation

## 2016-03-09 DIAGNOSIS — R293 Abnormal posture: Secondary | ICD-10-CM | POA: Insufficient documentation

## 2016-03-09 DIAGNOSIS — K592 Neurogenic bowel, not elsewhere classified: Secondary | ICD-10-CM | POA: Insufficient documentation

## 2016-03-09 DIAGNOSIS — S31000A Unspecified open wound of lower back and pelvis without penetration into retroperitoneum, initial encounter: Secondary | ICD-10-CM | POA: Insufficient documentation

## 2016-03-10 ENCOUNTER — Ambulatory Visit (INDEPENDENT_AMBULATORY_CARE_PROVIDER_SITE_OTHER): Payer: Medicare Other | Admitting: Psychology

## 2016-03-10 DIAGNOSIS — F3132 Bipolar disorder, current episode depressed, moderate: Secondary | ICD-10-CM | POA: Diagnosis not present

## 2016-03-13 ENCOUNTER — Ambulatory Visit: Payer: PRIVATE HEALTH INSURANCE | Attending: Physical Medicine & Rehabilitation | Admitting: Physical Therapy

## 2016-03-13 ENCOUNTER — Ambulatory Visit: Payer: Self-pay | Admitting: Surgery

## 2016-03-13 DIAGNOSIS — G8221 Paraplegia, complete: Secondary | ICD-10-CM | POA: Diagnosis not present

## 2016-03-13 NOTE — Therapy (Signed)
[facility] [address] [place], [place], [address] Phone: [phone]   Fax:  [phone]  Physical Therapy Evaluation  Patient Details  Name: [name] MRN: [id number] Date of Birth: [birth year] Referring Provider: Dr. Maryla MorrowAnkit Patel  Encounter Date: 03/13/2016      PT End of Session - 03/13/16 2016    Visit Number 1   Number of Visits 3   Date for PT Re-Evaluation 04/12/16   Authorization Type Medicare   Authorization Time Period 03-13-16 - 05-12-16   PT Start Time 0847   PT Stop Time 0922   PT Time Calculation (min) 35 min      Past Medical History:  Diagnosis Date  . Thoracic spinal cord injury [facility](HCC)     Past Surgical History:  Procedure Laterality Date  . PROGRAMABLE BACLOFEN PUMP REVISION Left 2014   Removal     There were no vitals filed for this visit.       Subjective Assessment - 03/13/16 2009    Subjective Pt presents for wheelchair seating evaluation - for new cushion - is currently on a Jay 3 cushion   Pertinent History T 10 SCI in 2007; Stage III pressure ulcer L sacral/coccyx region   Patient Stated Goals obtain new wheelchair cushion   Currently in Pain? No/denies            [facility] - 03/13/16 0902      Assessment   Medical Diagnosis Thoracic SCI   Referring Provider Dr. Maryla MorrowAnkit Patel   Onset Date/Surgical Date --  2007     Precautions   Precautions Other (comment)  pt has stage III ulcer L sacral region     Balance Screen   Has the patient fallen in the past 6 months No   Has the patient had a decrease in activity level because of a fear of falling?  No   Is the patient reluctant to leave their home because of a fear of falling?  No     Prior Function   Level of Independence Independent with community mobility with device;Independent with household mobility with device;Independent with transfers  pt using manual wheelchair                                 PT Long Term Goals - 03/13/16 2023      PT LONG TERM GOAL #1   Title Complete wheelchair seating eval with assistance of wheelchair vendor of pt's choice.  04-12-16   Time 4   Period Weeks   Status New     PT LONG TERM GOAL #2   Title Independent in HEP for postural exercises.  04-12-16   Time 4   Period Weeks   Status New               Plan - 03/13/16 2017    Clinical Impression Statement Pt presents to PT in manual wheelchair with paraplegia due to T8 SCI in 2007.  He reports he needs a new cushion that will provide more positioning and help to reduce the progression of scoliosis -    Rehab Potential Fair   PT Frequency 1x / week   PT Duration 2 weeks   PT Treatment/Interventions ADLs/Self Care Home Management;Patient/family education;Other (comment);Therapeutic activities;Therapeutic exercise  wheelchair management   PT Next Visit Plan vendor to be present for wheelchair seating eval   Recommended Other Services wheelchair vendor  to assist with wheelchair cushion eval   Consulted and Agree with Plan of Care Patient      Patient will benefit from skilled therapeutic intervention in order to improve the following deficits and impairments:  Decreased skin integrity, Postural dysfunction  Visit Diagnosis: Paraplegia, complete (HCC) - Plan: PT plan of care cert/re-cert      G-Codes - 03/13/16 2033    Functional Assessment Tool Used clinical judgment   Functional Limitation Self care   Self Care Current Status (Z6109(G8987) At least 40 percent but less than 60 percent impaired, limited or restricted   Self Care Goal Status (U0454(G8988) At least 20 percent but less than 40 percent impaired, limited or restricted       Problem List Patient Active Problem List   Diagnosis Date Noted  . Stage III pressure ulcer of sacral region (HCC) 11/05/2015  . Open wound of lower back and pelvis w/o penentrat into retroperitoneum  10/12/2015    Kary KosDilday, Aidyn Sportsman Suzanne, PT 03/13/2016, 8:36 PM  [facility] [address] [place], [place], [address] Phone: [phone]   Fax:  [phone]  Name: [name] MRN: [id number] Date of Birth: 1970-12-18

## 2016-03-17 ENCOUNTER — Ambulatory Visit: Payer: Self-pay | Admitting: Psychology

## 2016-03-23 ENCOUNTER — Ambulatory Visit: Payer: Medicare Other | Attending: Physical Medicine & Rehabilitation | Admitting: Physical Therapy

## 2016-03-23 ENCOUNTER — Encounter: Payer: Medicare Other | Admitting: Nurse Practitioner

## 2016-03-23 DIAGNOSIS — G8222 Paraplegia, incomplete: Secondary | ICD-10-CM | POA: Diagnosis not present

## 2016-03-23 DIAGNOSIS — L89153 Pressure ulcer of sacral region, stage 3: Secondary | ICD-10-CM | POA: Diagnosis not present

## 2016-03-23 DIAGNOSIS — L89322 Pressure ulcer of left buttock, stage 2: Secondary | ICD-10-CM | POA: Diagnosis not present

## 2016-03-23 DIAGNOSIS — Z8249 Family history of ischemic heart disease and other diseases of the circulatory system: Secondary | ICD-10-CM | POA: Diagnosis not present

## 2016-03-23 DIAGNOSIS — F17210 Nicotine dependence, cigarettes, uncomplicated: Secondary | ICD-10-CM | POA: Diagnosis not present

## 2016-03-24 ENCOUNTER — Ambulatory Visit: Payer: Self-pay | Admitting: Psychology

## 2016-03-25 NOTE — Progress Notes (Signed)
[name], Wilfred ([id number]) Visit Report for 03/23/2016 Arrival Information Details Patient Name: [name], Eduin Date of Service: 03/23/2016 9:45 AM Medical Record Number: [id number] Patient Account Number: [id number] Date of Birth/Sex: 1970-06-30 [age] y.o. Male) Treating RN: Phillis HaggisPinkerton, Debi Primary Care Physician: PATIENT, NO Other Clinician: Referring Physician: Treating Physician/Extender: Kathreen Cosieroulter, Leah Weeks in Treatment: 31 Visit Information History Since Last Visit All ordered tests and consults were completed: No Patient Arrived: Wheel Chair Added or deleted any medications: No Arrival Time: 09:53 Any new allergies or adverse reactions: No Accompanied By: self Had a fall or experienced change in No Transfer Assistance: EasyPivot activities of daily living that may affect Patient Lift risk of falls: Patient Identification Verified: Yes Signs or symptoms of abuse/neglect since last No Secondary Verification Process Yes visito Completed: Hospitalized since last visit: No Patient Requires Transmission- No Pain Present Now: No Based Precautions: Patient Has Alerts: No Electronic Signature(s) Signed: 03/24/2016 8:07:17 AM By: Alejandro MullingPinkerton, Debra Entered By: Alejandro MullingPinkerton, Debra on 03/23/2016 09:54:15 [name], Ac ([id number]) -------------------------------------------------------------------------------- Encounter Discharge Information Details Patient Name: [name], Brooke Date of Service: 03/23/2016 9:45 AM Medical Record Number: [id number] Patient Account Number: [id number] Date of Birth/Sex: 1970-06-30 [age] y.o. Male) Treating RN: Phillis HaggisPinkerton, Debi Primary Care Physician: PATIENT, NO Other Clinician: Referring Physician: Treating Physician/Extender: Kathreen Cosieroulter, Leah Weeks in Treatment: 31 Encounter Discharge Information Items Discharge Pain Level: 0 Discharge Condition: Stable Ambulatory Status: Wheelchair Discharge Destination: Home Transportation: Private  Auto Accompanied By: self Schedule Follow-up Appointment: Yes Medication Reconciliation completed and provided to Patient/Care Yes Navarro Nine: Provided on Clinical Summary of Care: 03/23/2016 Form Type [facility]ipient Paper [facility] Signature(s) Signed: 03/23/2016 10:22:32 AM By: Gwenlyn PerkingMoore, Shelia Entered By: Gwenlyn PerkingMoore, Shelia on 03/23/2016 10:22:31 [name], Dontavian ([id number]) -------------------------------------------------------------------------------- Lower Extremity Assessment Details Patient Name: [name], Magnum Date of Service: 03/23/2016 9:45 AM Medical Record Number: [id number] Patient Account Number: [id number] Date of Birth/Sex: 1970-06-30 [age] y.o. Male) Treating RN: Phillis HaggisPinkerton, Debi Primary Care Physician: PATIENT, NO Other Clinician: Referring Physician: Treating Physician/Extender: Bonnell Publicoulter, Leah Weeks in Treatment: 31 Electronic Signature(s) Signed: 03/24/2016 8:07:17 AM By: Alejandro MullingPinkerton, Debra Entered By: Alejandro MullingPinkerton, Debra on 03/23/2016 09:58:20 [name], Jerrik ([id number]) -------------------------------------------------------------------------------- Multi Wound Chart Details Patient Name: [name], Naythen Date of Service: 03/23/2016 9:45 AM Medical Record Number: [id number] Patient Account Number: [id number] Date of Birth/Sex: 1970-06-30 [age] y.o. Male) Treating RN: Phillis HaggisPinkerton, Debi Primary Care Physician: PATIENT, NO Other Clinician: Referring Physician: Treating Physician/Extender: Kathreen Cosieroulter, Leah Weeks in Treatment: 31 Vital Signs Height(in): 67 Pulse(bpm): 83 Weight(lbs): 147 Blood Pressure 111/70 (mmHg): Body Mass Index(BMI): 23 Temperature(F): 98.1 Respiratory Rate 18 (breaths/min): Photos: [1:No Photos] [N/A:N/A] Wound Location: [1:Left Coccyx] [N/A:N/A] Wounding Event: [1:Pressure Injury] [N/A:N/A] Primary Etiology: [1:Pressure Ulcer] [N/A:N/A] Comorbid History: [1:Paraplegia] [N/A:N/A] Date Acquired: [1:12/24/2014] [N/A:N/A] Weeks of Treatment:  [1:31] [N/A:N/A] Wound Status: [1:Open] [N/A:N/A] Measurements L x W x D 1.5x1.9x0.2 [N/A:N/A] (cm) Area (cm) : [1:2.238] [N/A:N/A] Volume (cm) : [1:0.448] [N/A:N/A] % Reduction in Area: [1:-111.10%] [N/A:N/A] % Reduction in Volume: 15.50% [N/A:N/A] Classification: [1:Category/Stage III] [N/A:N/A] Exudate Amount: [1:Large] [N/A:N/A] Exudate Type: [1:Serous] [N/A:N/A] Exudate Color: [1:amber] [N/A:N/A] Foul Odor After [1:Yes] [N/A:N/A] Cleansing: Odor Anticipated Due to No [N/A:N/A] Product Use: Wound Margin: [1:Thickened] [N/A:N/A] Granulation Amount: [1:Medium (34-66%)] [N/A:N/A] Granulation Quality: [1:Pink] [N/A:N/A] Necrotic Amount: [1:Medium (34-66%)] [N/A:N/A] Exposed Structures: [1:Fat: Yes Fascia: No Tendon: No Muscle: No] [N/A:N/A] Joint: No Bone: No Epithelialization: None N/A N/A Periwound Skin Texture: Callus: Yes N/A N/A Periwound Skin Maceration: Yes N/A N/A Moisture: Moist: Yes Dry/Scaly: No Periwound Skin Color: No Abnormalities Noted N/A N/A Temperature: No Abnormality N/A  N/A Tenderness on No N/A N/A Palpation: Wound Preparation: Ulcer Cleansing: N/A N/A Rinsed/Irrigated with Saline Topical Anesthetic Applied: Other: lidocaine 4% Treatment Notes Electronic Signature(s) Signed: 03/24/2016 8:07:17 AM By: Alejandro Mulling Entered By: Alejandro Mulling on 03/23/2016 10:04:44 Matthew Evangelist ([id number]) -------------------------------------------------------------------------------- Multi-Disciplinary Care Plan Details Patient Name: Matthew Evangelist Date of Service: 03/23/2016 9:45 AM Medical Record Number: [id number] Patient Account Number: [id number] Date of Birth/Sex: 09-23-1970 (45 y.o. Male) Treating RN: Phillis Haggis Primary Care Physician: PATIENT, NO Other Clinician: Referring Physician: Treating Physician/Extender: Kathreen Cosier in Treatment: 31 Active Inactive Abuse / Safety / Falls / Self Care Management Nursing  Diagnoses: Potential for falls Goals: Patient will remain injury free Date Initiated: 08/17/2015 Goal Status: Active Interventions: Assess fall risk on admission and as needed Notes: Nutrition Nursing Diagnoses: Imbalanced nutrition Goals: Patient/caregiver agrees to and verbalizes understanding of need to use nutritional supplements and/or vitamins as prescribed Date Initiated: 08/17/2015 Goal Status: Active Interventions: Assess patient nutrition upon admission and as needed per policy Notes: Orientation to the Wound Care Program Nursing Diagnoses: Knowledge deficit related to the wound healing center program Goals: Patient/caregiver will verbalize understanding of the Wound Healing Center Program DAEGAN, ARIZMENDI ([id number]) Date Initiated: 08/17/2015 Goal Status: Active Interventions: Provide education on orientation to the wound center Notes: Pressure Nursing Diagnoses: Knowledge deficit related to causes and risk factors for pressure ulcer development Goals: Patient will remain free from development of additional pressure ulcers Date Initiated: 08/17/2015 Goal Status: Active Interventions: Assess: immobility, friction, shearing, incontinence upon admission and as needed Notes: Wound/Skin Impairment Nursing Diagnoses: Impaired tissue integrity Goals: Ulcer/skin breakdown will have a volume reduction of 30% by week 4 Date Initiated: 08/17/2015 Goal Status: Active Ulcer/skin breakdown will have a volume reduction of 50% by week 8 Date Initiated: 08/17/2015 Goal Status: Active Ulcer/skin breakdown will have a volume reduction of 80% by week 12 Date Initiated: 08/17/2015 Goal Status: Active Interventions: Assess patient/caregiver ability to obtain necessary supplies Assess ulceration(s) every visit Notes: Electronic Signature(s) Signed: 03/24/2016 8:07:17 AM By: Servando Snare, Leonette Most ([id number]) Entered By: Alejandro Mulling on 03/23/2016  10:04:12 Matthew Evangelist ([id number]) -------------------------------------------------------------------------------- Pain Assessment Details Patient Name: Matthew Evangelist Date of Service: 03/23/2016 9:45 AM Medical Record Number: [id number] Patient Account Number: [id number] Date of Birth/Sex: [birth year] (45 y.o. Male) Treating RN: Phillis Haggis Primary Care Physician: PATIENT, NO Other Clinician: Referring Physician: Treating Physician/Extender: Kathreen Cosier in Treatment: 31 Active Problems Location of Pain Severity and Description of Pain Patient Has Paino No Site Locations With Dressing Change: No Pain Management and Medication Current Pain Management: Electronic Signature(s) Signed: 03/24/2016 8:07:17 AM By: Alejandro Mulling Entered By: Alejandro Mulling on 03/23/2016 09:54:22 Matthew Evangelist ([id number]) -------------------------------------------------------------------------------- Patient/Caregiver Education Details Patient Name: Matthew Evangelist Date of Service: 03/23/2016 9:45 AM Medical Record Number: [id number] Patient Account Number: [id number] Date of Birth/Gender: 27-Mar-1971 (45 y.o. Male) Treating RN: Phillis Haggis Primary Care Physician: PATIENT, NO Other Clinician: Referring Physician: Treating Physician/Extender: Kathreen Cosier in Treatment: 31 Education Assessment Education Provided To: Patient Education Topics Provided Wound/Skin Impairment: Handouts: Other: change dressing as ordered Methods: Demonstration, Explain/Verbal Responses: State content correctly Electronic Signature(s) Signed: 03/24/2016 8:07:17 AM By: Alejandro Mulling Entered By: Alejandro Mulling on 03/23/2016 10:15:11 Matthew Evangelist ([id number]) -------------------------------------------------------------------------------- Wound Assessment Details Patient Name: Matthew Evangelist Date of Service: 03/23/2016 9:45 AM Medical Record Number: [id number] Patient Account Number:  [id number] Date of Birth/Sex: 01/15/1971 (45 y.o. Male) Treating RN: Phillis Haggis Primary Care Physician: PATIENT, NO Other Clinician: Referring Physician: Treating Physician/Extender: Kathreen Cosier in  Treatment: 31 Wound Status Wound Number: 1 Primary Etiology: Pressure Ulcer Wound Location: Left Coccyx Wound Status: Open Wounding Event: Pressure Injury Comorbid History: Paraplegia Date Acquired: 12/24/2014 Weeks Of Treatment: 31 Clustered Wound: No Photos Photo Uploaded By: Alejandro MullingPinkerton, Debra on 03/23/2016 11:33:23 Wound Measurements Length: (cm) 1.5 Width: (cm) 1.9 Depth: (cm) 0.2 Area: (cm) 2.238 Volume: (cm) 0.448 % Reduction in Area: -111.1% % Reduction in Volume: 15.5% Epithelialization: None Tunneling: No Undermining: No Wound Description Classification: Category/Stage III Foul Odor Aft Wound Margin: Thickened Due to Produc Exudate Amount: Large Exudate Type: Serous Exudate Color: amber er Cleansing: Yes t Use: No Wound Bed Granulation Amount: Medium (34-66%) Exposed Structure Granulation Quality: Pink Fascia Exposed: No Necrotic Amount: Medium (34-66%) Fat Layer Exposed: Yes Necrotic Quality: Adherent Slough Tendon Exposed: No [name], Saylor ([id number]) Muscle Exposed: No Joint Exposed: No Bone Exposed: No Periwound Skin Texture Texture Color No Abnormalities Noted: No No Abnormalities Noted: No Callus: Yes Temperature / Pain Moisture Temperature: No Abnormality No Abnormalities Noted: No Dry / Scaly: No Maceration: Yes Moist: Yes Wound Preparation Ulcer Cleansing: Rinsed/Irrigated with Saline Topical Anesthetic Applied: Other: lidocaine 4%, Electronic Signature(s) Signed: 03/24/2016 8:07:17 AM By: Alejandro MullingPinkerton, Debra Entered By: Alejandro MullingPinkerton, Debra on 03/23/2016 10:04:02 [name], Jordan ([id number]) -------------------------------------------------------------------------------- Vitals Details Patient Name: [name], Lorik Date of  Service: 03/23/2016 9:45 AM Medical Record Number: [id number] Patient Account Number: [id number] Date of Birth/Sex: Mar 04, 1971 [age] y.o. Male) Treating RN: Phillis HaggisPinkerton, Debi Primary Care Physician: PATIENT, NO Other Clinician: Referring Physician: Treating Physician/Extender: Kathreen Cosieroulter, Leah Weeks in Treatment: 31 Vital Signs Time Taken: 09:54 Temperature (F): 98.1 Height (in): 67 Pulse (bpm): 83 Weight (lbs): 147 Respiratory Rate (breaths/min): 18 Body Mass Index (BMI): 23 Blood Pressure (mmHg): 111/70 Reference Range: 80 - 120 mg / dl Electronic Signature(s) Signed: 03/24/2016 8:07:17 AM By: Alejandro MullingPinkerton, Debra Entered By: Alejandro MullingPinkerton, Debra on 03/23/2016 09:56:09

## 2016-03-25 NOTE — Progress Notes (Signed)
ELDO, UMANZOR ([id number]) Visit Report for 03/23/2016 Chief Complaint Document Details Patient Name: [name], [name] Date of Service: 03/23/2016 9:45 AM Medical Record Number: [id number] Patient Account Number: [id number] Date of Birth/Sex: 25-Jul-1970 (45 y.o. Male) Treating RN: Phillis Haggis Primary Care Physician: PATIENT, NO Other Clinician: Referring Physician: Treating Physician/Extender: Kathreen Cosier in Treatment: 31 Information Obtained from: Patient Chief Complaint Mr. Abrahamsen presents today, after missing last week's appointment, for follow-up on his left buttock stage III pressure ulcer. Electronic Signature(s) Signed: 03/23/2016 10:20:09 AM By: Penne Lash, NP, Reggie Pile By: Penne Lash, NP, Peg Fifer on 03/23/2016 10:20:09 JIMMEY, HENGEL ([id number]) -------------------------------------------------------------------------------- Debridement Details Patient Name: [name] Date of Service: 03/23/2016 9:45 AM Medical Record Number: [id number] Patient Account Number: [id number] Date of Birth/Sex: 07/18/1970 (45 y.o. Male) Treating RN: Phillis Haggis Primary Care Physician: PATIENT, NO Other Clinician: Referring Physician: Treating Physician/Extender: Kathreen Cosier in Treatment: 31 Debridement Performed for Wound #1 Left Coccyx Assessment: Performed By: Physician Bonnell Public, NP Debridement: Debridement Pre-procedure Yes - 10:09 Verification/Time Out Taken: Start Time: 10:10 Pain Control: Lidocaine 4% Topical Solution Level: Skin/Subcutaneous Tissue Total Area Debrided (L x 1.5 (cm) x 1.9 (cm) = 2.85 (cm) W): Tissue and other Viable, Non-Viable, Exudate, Fibrin/Slough, Other, Subcutaneous material debrided: Instrument: Curette Bleeding: Minimum Hemostasis Achieved: Pressure End Time: 10:14 Procedural Pain: 0 Post Procedural Pain: 0 Response to Treatment: Procedure was tolerated well Post Debridement Measurements of Total Wound Length: (cm)  1.5 Stage: Category/Stage III Width: (cm) 1.9 Depth: (cm) 0.2 Volume: (cm) 0.448 Character of Wound/Ulcer Post Requires Further Debridement: Debridement Severity of Tissue Post Fat layer exposed Debridement: Post Procedure Diagnosis Same as Pre-procedure Electronic Signature(s) Signed: 03/23/2016 10:19:26 AM By: Penne Lash, NP, Avary Eichenberger Signed: 03/24/2016 8:07:17 AM By: Alejandro Mulling Previous Signature: 03/23/2016 10:19:09 AM Version By: Penne Lash, NP, Ewing Schlein, Leonette Most ([id number]) Entered By: Penne Lash, NP, Dollene Mallery on 03/23/2016 10:19:26 CAROL, LOFTIN ([id number]) -------------------------------------------------------------------------------- HPI Details Patient Name: [name] Date of Service: 03/23/2016 9:45 AM Medical Record Number: [id number] Patient Account Number: [id number] Date of Birth/Sex: Jul 15, 1970 (45 y.o. 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 He was diagnosed with what sounds like a stage III pressure ulcer in September 2016 although this may have been present for some time longer than that perhaps July 2016. He was followed by wound care in [place]. By his description there were using Silvadene cream for a period of time and then 3 months ago change to Aquacel Ag which she is continued and changes daily with help from his mother. He has recently relocated to [facility]. He went to urgent care to follow-up on his wound on 08/04/2015 they did a culture of this wound that showed Pseudomonas and MSSA. He was placed on an antibiotic but doesn't remember which one it is. He has continued using Aquacel Ag and a foam based cover. I don't have any of  his records from the wound care center in [place] although the patient states he did have an MRI that did not show osteomyelitis. He is not aware of having a bone culture done and doesn't think that he had exposed bone on this and any point. The patient does in and out cath's and has no urinary incontinence issues. He is on appropriate bowel regimen which  she administers suppositories. He is able to position himself appropriately and is wheelchair to try and avoid pressure over this wound 08/24/15; small horizontal wound over his lower sacral area. Requires debridement of surface slough and nonviable subcutaneous tissue. There is a small amount of undermining noted no evidence of infection. On the right side of this wound there is some senescent rolled edges which may need debridement I did not do this today. 09/01/15 this is a small wound over the lower sacral area. It has tunneling superiorly. This goes roughly 1 cm. Nonviable subcutaneous tissue at the inferior aspect of the wound base. No real evidence of infection 09/07/15; small horizontal wound over the lower sacral area. Last week it had 1 cm of superior tunneling which appears to of resolved. No evidence of infection 09/21/15; continues to have a small horizontal wound over the lower sacral area. We have been using Prisma and this has contracted. He tells me that his mother is aware medication not able to change the dressing although he states that he is able to do this 09/28/15; a small horizontal wound over his lower sacral area is all that is left of this wound which was probably much larger and deeper at one point. Depth today at 0.7 cm. Base of this had some nonviable subcutaneous tissue that I removed. Appears to be stalling on current dressing which is Prisma 10/19/15; I have not seen this patient in several weeks. He arrives today with a linear/horizontal wound across his lower sacral area. Considerable depth of this wound.  Extensive debridement done 11/17/15; patient arrives for two-week visit. He is not doing well wound is bigger with odor and drainage. Has been using Aquacel Ag. I have not seen this in over a month although I think he was seen by the physician covering me 2 weeks ago on 11/05/15 11/24/15; I brought this man back this week to review the wound after last week's deterioration. Culture grew Enterobacter and some staph aureus [MSSA]. I had ordered him Cipro but we don't seem to of been able to communicate with the patient therefore we've called in today. He is not been systemically unwell. Still using Aquacel 12/15/15; very little change in this wound since the last time I saw it. Horizontal wound over the lower sacral Zaragoza, Qualyn ([id number]) area probably about 2 cm in width and 0.8 cm in depth. There is no evidence of infection. There appears to be callus around this. In spite of the patient's best efforts I think there is an adequate pressure relief here. No current evidence of infection 12/22/15; horizontal pressure ulcer over the lower sacral area. Appears to be filling in and has better dimensions in appearance today. Still circumferential callus which would suggest inadequate offloading although everything else appears to be improved 12/29/15 horizontal pressure wound over the lower sacral area. Appears to be filling in however skin groin down into the divot. No evidence of infection 01/05/16; comes in today with not much change in this wound. There is surrounding callus indicative of pressure. Not much change in the wound bed. This clearly require debridement today we have been using Hydrofera Blue 02/04/16: returns today after an absence. no systemic s/s of infection. once again, periwound callus is present suggesting pressure. 02/18/2016 -- she was away for 2 weeks traveling to [place] and during that time he had a new pressure injury to the left ischial tuberosity which he thinks may have been  caused while transferring. 03-23-2016; [name] returns today, after  missing last week's appointment, for evaluation of his left buttock stage III pressure ulcer. There is no ulceration to the left ischial tuberosity at this appointment. He states that he has been changing the dressing to his ulcer daily due to daily showering. He voices no complaints or concerns and denies benign antibiotic therapy for any reason at this point. Electronic Signature(s) Signed: 03/23/2016 11:34:26 AM By: Penne Lash, NP, Reggie Pile By: Penne Lash, NP, Millette Halberstam on 03/23/2016 11:34:26 GREYSIN, MEDLEN ([id number]) -------------------------------------------------------------------------------- Physical Exam Details Patient Name: [name] Date of Service: 03/23/2016 9:45 AM Medical Record Number: [id number] Patient Account Number: [id number] Date of Birth/Sex: Aug 28, 1970 (45 y.o. Male) Treating RN: Phillis Haggis Primary Care Physician: PATIENT, NO Other Clinician: Referring Physician: Treating Physician/Extender: Kathreen Cosier in Treatment: 31 Constitutional BP within normal limits. afebrile. well nourished; well developed; appears stated age;Marland Kitchen Respiratory non-labored respiratory effort. Musculoskeletal non-ambulatory; wheelchair bound; paraplegia, able to transfer self from wheelchair to exam chair. Integumentary (Hair, Skin) wound is red with minimal granulation buds central area with adherent slough no periwound; entire peri- ulcer is macerated. no induration, no fluctuance. Neurological paraplegia. Psychiatric appears to make sound judgement and have accurate insight regarding healthcare. oriented to time, place, person and situation. calm, pleasant, conversive. Notes patient states that he showers daily due to excessive perspiration during sleep with [place] causing the dressing to be saturated and requiring daily dressing changes; he was encouraged to shower every other day per dressing change  schedule and sponge bath the alternative days Electronic Signature(s) Signed: 03/23/2016 11:38:04 AM By: Penne Lash, NP, Reggie Pile By: Penne Lash, NP, Aarvi Stotts on 03/23/2016 11:38:03 [name] ([id number]) -------------------------------------------------------------------------------- Physician Orders Details Patient Name: [name] Date of Service: 03/23/2016 9:45 AM Medical Record Number: [id number] Patient Account Number: [id number] Date of Birth/Sex: [birth year] (45 y.o. Male) Treating RN: Phillis Haggis Primary Care Physician: PATIENT, NO Other Clinician: Referring Physician: Treating Physician/Extender: Kathreen Cosier in Treatment: 34 Verbal / Phone Orders: Yes Clinician: Ashok Cordia, Debi Read Back and Verified: Yes Diagnosis Coding Wound Cleansing Wound #1 Left Coccyx o Clean wound with Normal Saline. Anesthetic Wound #1 Left Coccyx o Topical Lidocaine 4% cream applied to wound bed prior to debridement Skin Barriers/Peri-Wound Care Wound #1 Left Coccyx o Skin Prep Primary Wound Dressing Wound #1 Left Coccyx o Iodoflex Secondary Dressing Wound #1 Left Coccyx o Foam o Tegaderm Dressing Change Frequency Wound #1 Left Coccyx o Change dressing every other day. Follow-up Appointments Wound #1 Left Coccyx o Return Appointment in 2 weeks. Off-Loading Wound #1 Left Coccyx o Turn and reposition every 2 hours Additional Orders / Instructions JAYMERE, ALEN ([id number]) Wound #1 Left Coccyx o Increase protein intake. Medications-please add to medication list. Wound #1 Left Coccyx o Other: - Vitamin C, A and Zinc Electronic Signature(s) Signed: 03/24/2016 8:07:17 AM By: Alejandro Mulling Signed: 03/24/2016 8:33:28 AM By: Penne Lash, NP, Makyi Ledo Entered By: Alejandro Mulling on 03/23/2016 10:14:13 BAYNE, FOSNAUGH ([id number]) -------------------------------------------------------------------------------- Problem List Details Patient Name:  [name] Date of Service: 03/23/2016 9:45 AM Medical Record Number: [id number] Patient Account Number: [id number] Date of Birth/Sex: January 22, 1971 (45 y.o. Male) Treating RN: Phillis Haggis Primary Care Physician: PATIENT, NO Other Clinician: Referring Physician: Treating Physician/Extender: Kathreen Cosier in Treatment: 31 Active Problems ICD-10 Encounter Code Description Active Date Diagnosis L89.323 Pressure ulcer of left buttock, stage 3 03/23/2016 Yes G82.22 Paraplegia, incomplete 08/17/2015 Yes Inactive Problems Resolved Problems ICD-10 Code Description Active Date Resolved Date L89.153 Pressure ulcer of sacral region, stage 3 08/17/2015 08/17/2015 L89.322 Pressure ulcer of left buttock, stage  2 02/18/2016 02/18/2016 Electronic Signature(s) Signed: 03/23/2016 10:18:39 AM By: Penne Lash, NP, Reggie Pile By: Penne Lash, NP, Hera Celaya on 03/23/2016 10:18:39 SHYQUAN, STALLBAUMER ([id number]) -------------------------------------------------------------------------------- Progress Note Details Patient Name: [name] Date of Service: 03/23/2016 9:45 AM Medical Record Number: [id number] Patient Account Number: [id number] Date of Birth/Sex: March 27, 1971 (45 y.o. Male) Treating RN: Phillis Haggis Primary Care Physician: PATIENT, NO Other Clinician: Referring Physician: Treating Physician/Extender: Kathreen Cosier in Treatment: 31 Subjective Chief Complaint Information obtained from Patient Mr. Mozer presents today, after missing last week's appointment, for follow-up on his left buttock stage III pressure ulcer. 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 He was diagnosed with what sounds like a stage III pressure ulcer in September 2016 although this may have been present for some time longer than that perhaps July 2016. He was followed by wound care in  [place]. By his description there were using Silvadene cream for a period of time and then 3 months ago change to Aquacel Ag which she is continued and changes daily with help from his mother. He has recently relocated to [facility]. He went to urgent care to follow- up on his wound on 08/04/2015 they did a culture of this wound that showed Pseudomonas and MSSA. He was placed on an antibiotic but doesn't remember which one it is. He has continued using Aquacel Ag and a foam based cover. I don't have any of his records from the wound care center in [place] although the patient states he did have an MRI that did not show osteomyelitis. He is not aware of having a bone culture done and doesn't think that he had exposed bone on this and any point. The patient does in and out cath's and has no urinary incontinence issues. He is on appropriate bowel regimen which she administers suppositories. He is able to position himself appropriately and is wheelchair to try and avoid pressure over this wound 08/24/15; small horizontal wound over his lower sacral area. Requires debridement of surface slough and nonviable subcutaneous tissue. There is a small amount of undermining noted no evidence of infection. On the right side of this wound there is some senescent rolled edges which may need debridement I did not do this today. 09/01/15 this is a small wound over the lower sacral area. It has tunneling superiorly. This goes roughly 1 cm. Nonviable subcutaneous tissue at the inferior aspect of the wound base. No real evidence of infection 09/07/15; small horizontal wound over the lower sacral area. Last week it had 1 cm of superior tunneling which appears to of resolved. No evidence of infection 09/21/15; continues to have a small horizontal wound over the lower sacral area. We have been using Prisma and this has contracted. He tells me that his mother is aware medication not able to change  the dressing although he states that he is able to do this 09/28/15; a small horizontal wound over his lower sacral area is all that is left of this wound which was probably much larger and deeper at one point. Depth today at 0.7 cm. Base of this had some nonviable subcutaneous tissue that I removed. Appears to be stalling on current dressing which is Prisma 10/19/15; I have not seen this patient in several weeks. He arrives today with a linear/horizontal wound across his lower sacral area. Considerable depth of this wound. Extensive debridement done 11/17/15;  patient arrives for two-week visit. He is not doing well wound is bigger with odor and drainage. Has [name], Avi ([id number]) been using Aquacel Ag. I have not seen this in over a month although I think he was seen by the physician covering me 2 weeks ago on 11/05/15 11/24/15; I brought this man back this week to review the wound after last week's deterioration. Culture grew Enterobacter and some staph aureus [MSSA]. I had ordered him Cipro but we don't seem to of been able to communicate with the patient therefore we've called in today. He is not been systemically unwell. Still using Aquacel 12/15/15; very little change in this wound since the last time I saw it. Horizontal wound over the lower sacral area probably about 2 cm in width and 0.8 cm in depth. There is no evidence of infection. There appears to be callus around this. In spite of the patient's best efforts I think there is an adequate pressure relief here. No current evidence of infection 12/22/15; horizontal pressure ulcer over the lower sacral area. Appears to be filling in and has better dimensions in appearance today. Still circumferential callus which would suggest inadequate offloading although everything else appears to be improved 12/29/15 horizontal pressure wound over the lower sacral area. Appears to be filling in however skin groin down into the divot. No evidence of  infection 01/05/16; comes in today with not much change in this wound. There is surrounding callus indicative of pressure. Not much change in the wound bed. This clearly require debridement today we have been using Hydrofera Blue 02/04/16: returns today after an absence. no systemic s/s of infection. once again, periwound callus is present suggesting pressure. 02/18/2016 -- she was away for 2 weeks traveling to [place] and during that time he had a new pressure injury to the left ischial tuberosity which he thinks may have been caused while transferring. 03-23-2016; [name] returns today, after missing last week's appointment, for evaluation of his left buttock stage III pressure ulcer. There is no ulceration to the left ischial tuberosity at this appointment. He states that he has been changing the dressing to his ulcer daily due to daily showering. He voices no complaints or concerns and denies benign antibiotic therapy for any reason at this point. Objective Constitutional BP within normal limits. afebrile. well nourished; well developed; appears stated age;Marland Kitchen. Vitals Time Taken: 9:54 AM, Height: 67 in, Weight: 147 lbs, BMI: 23, Temperature: 98.1 F, Pulse: 83 bpm, Respiratory Rate: 18 breaths/min, Blood Pressure: 111/70 mmHg. Respiratory non-labored respiratory effort. Musculoskeletal non-ambulatory; wheelchair bound; paraplegia, able to transfer self from wheelchair to exam chair. Neurological paraplegia. [name], Braxon ([id number]) Psychiatric appears to make sound judgement and have accurate insight regarding healthcare. oriented to time, place, person and situation. calm, pleasant, conversive. General Notes: patient states that he showers daily due to excessive perspiration during sleep with us causing the dressing to be saturated and requiring daily dressing changes; he was encouraged to shower every other day per dressing change schedule and sponge bath the alternative  days Integumentary (Hair, Skin) wound is red with minimal granulation buds central area with adherent slough no periwound; entire peri- ulcer is macerated. no induration, no fluctuance. Wound #1 status is Open. Original cause of wound was Pressure Injury. The wound is located on the Left Sacrum. The wound measures 1.5cm length x 1.9cm width x 0.2cm depth; 2.238cm^2 area and 0.448cm^3 volume. There is fat exposed. There is no tunneling or undermining noted. There is a large amount  of serous drainage noted. The wound margin is thickened. There is medium (34-66%) pink granulation within the wound bed. There is a medium (34-66%) amount of necrotic tissue within the wound bed including Adherent Slough. The periwound skin appearance exhibited: Callus, Maceration, Moist. The periwound skin appearance did not exhibit: Dry/Scaly. Periwound temperature was noted as No Abnormality. Assessment Active Problems ICD-10 L89.323 - Pressure ulcer of left buttock, stage 3 G82.22 - Paraplegia, incomplete patient states that his current cushion is proximally 47-year-old and he generally replaces them every 2 years Procedures Wound #1 Wound #1 is a Pressure Ulcer located on the Left Coccyx . There was a Skin/Subcutaneous Tissue Debridement ([id number]) debridement with total area of 2.85 sq cm performed by Bonnell Publicoulter, Kais Monje, NP. with the following instrument(s): Curette to remove Viable and Non-Viable tissue/material including Exudate, Fibrin/Slough, Other, and Subcutaneous after achieving pain control using Lidocaine 4% Topical Solution. A time out was conducted at 10:09, prior to the start of the procedure. A Minimum amount of bleeding was controlled with Pressure. The procedure was tolerated well with a pain level of 0 throughout and a pain level of 0 following the procedure. Post Debridement Measurements: 1.5cm length x 1.9cm width x 0.2cm depth; Hoerner, Charls ([id number]) 0.448cm^3 volume. Post debridement  Stage noted as Category/Stage III. Character of Wound/Ulcer Post Debridement requires further debridement. Severity of Tissue Post Debridement is: Fat layer exposed. Post procedure Diagnosis Wound #1: Same as Pre-Procedure excisional debridement down to and including subcutaneous tissue and periwound edges using a curette Plan Wound Cleansing: Wound #1 Left Coccyx: Clean wound with Normal Saline. Anesthetic: Wound #1 Left Coccyx: Topical Lidocaine 4% cream applied to wound bed prior to debridement Skin Barriers/Peri-Wound Care: Wound #1 Left Coccyx: Skin Prep Primary Wound Dressing: Wound #1 Left Coccyx: Iodoflex Secondary Dressing: Wound #1 Left Coccyx: Foam Tegaderm Dressing Change Frequency: Wound #1 Left Coccyx: Change dressing every other day. Follow-up Appointments: Wound #1 Left Coccyx: Return Appointment in 2 weeks. Off-Loading: Wound #1 Left Coccyx: Turn and reposition every 2 hours Additional Orders / Instructions: Wound #1 Left Coccyx: Increase protein intake. Medications-please add to medication list.: Wound #1 Left Coccyx: Other: - Vitamin C, A and Zinc [name], [place] ([id number]) Follow-Up Appointments: A follow-up appointment should be scheduled. Medication Reconciliation completed and provided to Patient/Care Provider. A [facility]. Change from Prisma to Iodosorb/Iodoflex every other day 2. Continue offloading at night with side-lying positions and frequent body lives while in the wheelchair 3. Follow-up next week Electronic Signature(s) Signed: 03/23/2016 11:40:12 AM By: Penne Lashoulter, NP, Norleen Xie Entered By: Penne Lashoulter, NP, Modesta Sammons on 03/23/2016 11:40:11 [name], Dan ([id number]) -------------------------------------------------------------------------------- SuperBill Details Patient Name: [name], Edelmiro Date of Service: 03/23/2016 Medical Record Number: [id number] Patient Account Number: [id number] Date of  Birth/Sex: [birth year] [age] y.o. Male) Treating RN: Phillis HaggisPinkerton, Debi Primary Care Physician: PATIENT, NO Other Clinician: Referring Physician: Treating Physician/Extender: Kathreen Cosieroulter, Emari Hreha Weeks in Treatment: 31 Diagnosis Coding ICD-10 Codes Code Description L89.323 Pressure ulcer of left buttock, stage 3 G82.22 Paraplegia, incomplete Facility Procedures CPT4 Code: [id number] Description: 11042 - DEB SUBQ TISSUE 20 SQ CM/< ICD-10 Description Diagnosis L89.323 Pressure ulcer of left buttock, stage 3 Modifier: Quantity: 1 Physician Procedures CPT4 Code: [id number] Description: 11042 - WC PHYS SUBQ TISS 20 SQ CM ICD-10 Description Diagnosis L89.323 Pressure ulcer of left buttock, stage 3 Modifier: Quantity: 1 Electronic Signature(s) Signed: 03/23/2016 11:40:25 AM By: Penne Lashoulter, NP, Sally Menard Entered By: Penne Lashoulter, NP, Bernabe Dorce on 03/23/2016 11:40:24

## 2016-03-30 ENCOUNTER — Encounter: Payer: Medicare Other | Attending: Surgery | Admitting: Surgery

## 2016-03-30 DIAGNOSIS — L89323 Pressure ulcer of left buttock, stage 3: Secondary | ICD-10-CM | POA: Diagnosis not present

## 2016-03-30 DIAGNOSIS — F17210 Nicotine dependence, cigarettes, uncomplicated: Secondary | ICD-10-CM | POA: Diagnosis not present

## 2016-03-30 DIAGNOSIS — Z8249 Family history of ischemic heart disease and other diseases of the circulatory system: Secondary | ICD-10-CM | POA: Insufficient documentation

## 2016-03-30 DIAGNOSIS — G8222 Paraplegia, incomplete: Secondary | ICD-10-CM | POA: Diagnosis not present

## 2016-03-30 DIAGNOSIS — L89153 Pressure ulcer of sacral region, stage 3: Secondary | ICD-10-CM | POA: Diagnosis not present

## 2016-03-31 ENCOUNTER — Ambulatory Visit (INDEPENDENT_AMBULATORY_CARE_PROVIDER_SITE_OTHER): Payer: Medicare Other | Admitting: Psychology

## 2016-03-31 DIAGNOSIS — F3132 Bipolar disorder, current episode depressed, moderate: Secondary | ICD-10-CM | POA: Diagnosis not present

## 2016-03-31 NOTE — Progress Notes (Signed)
[name], [name] ([id number]) Visit Report for 03/30/2016 Chief Complaint Document Details Patient Name: [name] Date of Service: 03/30/2016 9:45 AM Medical Record Number: [id number] Patient Account Number: [id number] Date of Birth/Sex: 1970/06/20 (45 y.o. Male) Treating RN: Phillis Haggis Primary Care Physician: PATIENT, NO Other Clinician: Referring Physician: Treating Physician/Extender: Rudene Re in Treatment: 69 Information Obtained from: Patient Chief Complaint [name] presents today, after missing last week's appointment, for follow-up on his left buttock stage III pressure ulcer. Electronic Signature(s) Signed: 03/30/2016 10:42:20 AM By: Evlyn Kanner MD, FACS Entered By: Evlyn Kanner on 03/30/2016 10:42:20 [name], [name] ([id number]) -------------------------------------------------------------------------------- Debridement Details Patient Name: [name] Date of Service: 03/30/2016 9:45 AM Medical Record Number: [id number] Patient Account Number: [id number] Date of Birth/Sex: 04-24-1971 (45 y.o. Male) Treating RN: Phillis Haggis Primary Care Physician: PATIENT, NO Other Clinician: Referring Physician: Treating Physician/Extender: Rudene Re in Treatment: 32 Debridement Performed for Wound #1 Left Sacrum Assessment: Performed By: Physician Evlyn Kanner, MD Debridement: Debridement Pre-procedure Yes - 10:27 Verification/Time Out Taken: Start Time: 10:28 Pain Control: Lidocaine 4% Topical Solution Level: Skin/Subcutaneous Tissue Total Area Debrided ([name] x 1 (cm) x 1.9 (cm) = 1.9 (cm) W): Tissue and other Viable, Non-Viable, Exudate, Fibrin/Slough, Subcutaneous material debrided: Instrument: Curette Bleeding: Minimum Hemostasis Achieved: Pressure End Time: 10:32 Procedural Pain: 0 Post Procedural Pain: 0 Response to Treatment: Procedure was tolerated well Post Debridement Measurements of Total Wound Length: (cm) 1 Stage:  Category/Stage III Width: (cm) 1.9 Depth: (cm) 0.3 Volume: (cm) 0.448 Character of Wound/Ulcer Post Requires Further Debridement: Debridement Severity of Tissue Post Fat layer exposed Debridement: Post Procedure Diagnosis Same as Pre-procedure Electronic Signature(s) Signed: 03/30/2016 10:42:13 AM By: Evlyn Kanner MD, FACS Signed: 03/30/2016 5:08:32 PM By: Alejandro Mulling Entered By: Evlyn Kanner on 03/30/2016 10:42:13 [name], [name] ([id number]) [name], [name] ([id number]) -------------------------------------------------------------------------------- HPI Details Patient Name: [name] Date of Service: 03/30/2016 9:45 AM Medical Record Number: [id number] Patient Account Number: [id number] Date of Birth/Sex: 1970-10-20 (45 y.o. 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 He was diagnosed with what sounds like a stage III pressure ulcer in September 2016 although this may have been present for some time longer than that perhaps July 2016. He was followed by wound care in [place]. By his description there were using Silvadene cream for a period of time and then 3 months ago change to Aquacel Ag which she is continued and changes daily with help from his mother. He has recently relocated to [facility]. He went to urgent care to follow-up on his wound on 08/04/2015 they did a culture of this wound that showed Pseudomonas and MSSA. He was placed on an antibiotic but doesn't remember which one it is. He has continued using Aquacel Ag and a foam based cover. I don't have any of his records from the wound care center in  although the patient states he  did have an MRI that did not show osteomyelitis. He is not aware of having a bone culture done and doesn't think that he had exposed bone on this and any point. The patient does in and out cath's and has no urinary incontinence issues. He is on appropriate bowel regimen which she administers suppositories. He is able to position himself appropriately and  is wheelchair to try and avoid pressure over this wound 08/24/15; small horizontal wound over his lower sacral area. Requires debridement of surface slough and nonviable subcutaneous tissue. There is a small amount of undermining noted no evidence of infection. On the right side of this wound there is some senescent rolled edges which may need debridement I did not do this today. 09/01/15 this is a small wound over the lower sacral area. It has tunneling superiorly. This goes roughly 1 cm. Nonviable subcutaneous tissue at the inferior aspect of the wound base. No real evidence of infection 09/07/15; small horizontal wound over the lower sacral area. Last week it had 1 cm of superior tunneling which appears to of resolved. No evidence of infection 09/21/15; continues to have a small horizontal wound over the lower sacral area. We have been using Prisma and this has contracted. He tells me that his mother is aware medication not able to change the dressing although he states that he is able to do this 09/28/15; a small horizontal wound over his lower sacral area is all that is left of this wound which was probably much larger and deeper at one point. Depth today at 0.7 cm. Base of this had some nonviable subcutaneous tissue that I removed. Appears to be stalling on current dressing which is Prisma 10/19/15; I have not seen this patient in several weeks. He arrives today with a linear/horizontal wound across his lower sacral area. Considerable depth of this wound. Extensive debridement done 11/17/15; patient arrives for two-week visit. He is not doing well  wound is bigger with odor and drainage. Has been using Aquacel Ag. I have not seen this in over a month although I think he was seen by the physician covering me 2 weeks ago on 11/05/15 11/24/15; I brought this man back this week to review the wound after last week's deterioration. Culture grew Enterobacter and some staph aureus [MSSA]. I had ordered him Cipro but we don't seem to of been able to communicate with the patient therefore we've called in today. He is not been systemically unwell. Still using Aquacel 12/15/15; very little change in this wound since the last time I saw it. Horizontal wound over the lower sacral Mceachin, Chiron ([id number]) area probably about 2 cm in width and 0.8 cm in depth. There is no evidence of infection. There appears to be callus around this. In spite of the patient's best efforts I think there is an adequate pressure relief here. No current evidence of infection 12/22/15; horizontal pressure ulcer over the lower sacral area. Appears to be filling in and has better dimensions in appearance today. Still circumferential callus which would suggest inadequate offloading although everything else appears to be improved 12/29/15 horizontal pressure wound over the lower sacral area. Appears to be filling in however skin groin down into the divot. No evidence of infection 01/05/16; comes in today with not much change in this wound. There is surrounding callus indicative of pressure. Not much change in the wound bed. This clearly require debridement today we have been using Hydrofera Blue 02/04/16: returns today after an absence. no systemic s/s of infection. once again, periwound callus is present suggesting pressure. 02/18/2016 -- she was away for 2 weeks traveling to [place] and during that time he had a new pressure injury to the left ischial tuberosity which he thinks may have been caused while transferring. 03-23-2016; [name] returns today, after missing last week's  appointment, for evaluation of his left buttock stage  III pressure ulcer. There is no ulceration to the left ischial tuberosity at this appointment. He states that he has been changing the dressing to his ulcer daily due to daily showering. He voices no complaints or concerns and denies benign antibiotic therapy for any reason at this point. 03/30/2016 -- the patient has been doing fine and after debridement of his wound today I believe is ready for a skin substitute and he has been previously approved for grafix and we will try and obtain this from next week Electronic Signature(s) Signed: 03/30/2016 10:43:39 AM By: Evlyn Kanner MD, FACS Entered By: Evlyn Kanner on 03/30/2016 10:43:39 [name], [name] ([id number]) -------------------------------------------------------------------------------- Physical Exam Details Patient Name: [name] Date of Service: 03/30/2016 9:45 AM Medical Record Number: [id number] Patient Account Number: [id number] Date of Birth/Sex: 1970-06-16 (45 y.o. Male) Treating RN: Phillis Haggis Primary Care Physician: PATIENT, NO Other Clinician: Referring Physician: Treating Physician/Extender: Rudene Re in Treatment: 32 Constitutional . Pulse regular. Respirations normal and unlabored. Afebrile. . Eyes Nonicteric. Reactive to light. Ears, Nose, Mouth, and Throat Lips, teeth, and gums WNL.Marland Kitchen Moist mucosa without lesions. Neck supple and nontender. No palpable supraclavicular or cervical adenopathy. Normal sized without goiter. Respiratory WNL. No retractions.. Breath sounds WNL, No rubs, rales, rhonchi, or wheeze.. Cardiovascular Heart rhythm and rate regular, no murmur or gallop.. Pedal Pulses WNL. No clubbing, cyanosis or edema. Lymphatic No adneopathy. No adenopathy. No adenopathy. Musculoskeletal Adexa without tenderness or enlargement.. Digits and nails w/o clubbing, cyanosis, infection, petechiae, ischemia, or inflammatory  conditions.. Integumentary (Hair, Skin) No suspicious lesions. No crepitus or fluctuance. No peri-wound warmth or erythema. No masses.Marland Kitchen Psychiatric Judgement and insight Intact.. No evidence of depression, anxiety, or agitation.. Notes sharp debridement of the wound base and the surrounding skin was done and minimal bleeding controlled with pressure Electronic Signature(s) Signed: 03/30/2016 10:44:06 AM By: Evlyn Kanner MD, FACS Entered By: Evlyn Kanner on 03/30/2016 10:44:06 [name] ([id number]) -------------------------------------------------------------------------------- Physician Orders Details Patient Name: [name] Date of Service: 03/30/2016 9:45 AM Medical Record Number: [id number] Patient Account Number: [id number] Date of Birth/Sex: 02/15/1971 (45 y.o. Male) Treating RN: Phillis Haggis Primary Care Physician: PATIENT, NO Other Clinician: Referring Physician: Treating Physician/Extender: Rudene Re in Treatment: 80 Verbal / Phone Orders: Yes ClinicianAshok Cordia, Debi Read Back and Verified: Yes Diagnosis Coding Wound Cleansing Wound #1 Left Sacrum o Clean wound with Normal Saline. Anesthetic Wound #1 Left Sacrum o Topical Lidocaine 4% cream applied to wound bed prior to debridement Skin Barriers/Peri-Wound Care Wound #1 Left Sacrum o Skin Prep Primary Wound Dressing Wound #1 Left Sacrum o Iodoflex Secondary Dressing Wound #1 Left Sacrum o Foam o Tegaderm Dressing Change Frequency Wound #1 Left Sacrum o Change dressing every other day. Follow-up Appointments Wound #1 Left Sacrum o Return Appointment in 2 weeks. Off-Loading Wound #1 Left Sacrum o Turn and reposition every 2 hours Additional Orders / Instructions [name], [name] ([id number]) Wound #1 Left Sacrum o Increase protein intake. Medications-please add to medication list. Wound #1 Left Sacrum o Other: - Vitamin C, A and Zinc Notes Order Grafix  for next week. Electronic Signature(s) Signed: 03/30/2016 4:12:42 PM By: Evlyn Kanner MD, FACS Signed: 03/30/2016 5:08:32 PM By: Alejandro Mulling Entered By: Alejandro Mulling on 03/30/2016 10:34:38 [name], [name] ([id number]) -------------------------------------------------------------------------------- Problem List Details Patient Name: [name] Date of Service: 03/30/2016 9:45 AM Medical Record Number: [id number] Patient Account Number: [id number] Date of Birth/Sex: [birth year] (45 y.o. Male) Treating RN: Phillis Haggis Primary Care Physician: PATIENT, NO Other Clinician: Referring Physician: Treating  Physician/Extender: Rudene ReBritto, Kyna Blahnik Weeks in Treatment: 6832 Active Problems ICD-10 Encounter Code Description Active Date Diagnosis L89.323 Pressure ulcer of left buttock, stage 3 03/23/2016 Yes G82.22 Paraplegia, incomplete 08/17/2015 Yes Inactive Problems Resolved Problems ICD-10 Code Description Active Date Resolved Date L89.322 Pressure ulcer of left buttock, stage 2 02/18/2016 02/18/2016 L89.153 Pressure ulcer of sacral region, stage 3 08/17/2015 08/17/2015 Electronic Signature(s) Signed: 03/30/2016 10:42:02 AM By: Evlyn KannerBritto, Zolton Dowson MD, FACS Entered By: Evlyn KannerBritto, Abdon Petrosky on 03/30/2016 10:42:02 [name], Karem ([id number]) -------------------------------------------------------------------------------- Progress Note Details Patient Name: [name], [name] Date of Service: 03/30/2016 9:45 AM Medical Record Number: [id number] Patient Account Number: [id number] Date of Birth/Sex: March 04, 1971 [age] y.o. Male) Treating RN: Phillis HaggisPinkerton, Debi Primary Care Physician: PATIENT, NO Other Clinician: Referring Physician: Treating Physician/Extender: Rudene ReBritto, Jenine Krisher Weeks in Treatment: 32 Subjective Chief Complaint Information obtained from Patient [name] presents today, after missing last week's appointment, for follow-up on his left buttock stage III pressure ulcer. 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 He was diagnosed with what sounds like a stage III pressure ulcer in September 2016 although this may have been present for some time longer than that perhaps July 2016. [place] was [place] By his description there were using Silvadene cream for a period of time and then 3 months ago change to Aquacel Ag which she is continued and changes daily with help from his mother. [facility] [place] [facility] [place] [facility] He went to urgent care to follow- up on his wound on 08/04/2015 they did a culture of this wound that showed Pseudomonas and MSSA. He was placed on an antibiotic but doesn't remember which one it is. He has continued using Aquacel Ag and a foam based cover. I don't have any of his records from the wound care center in [place] although the patient states he did have an MRI that did not show osteomyelitis. He is not aware of having a bone culture done and doesn't think that he had exposed bone on this and any point. The patient does in and out cath's and has no urinary incontinence issues. He is on appropriate bowel regimen which she administers suppositories. He is able to position himself appropriately and is wheelchair to try and avoid pressure over this wound 08/24/15; small horizontal wound over his lower sacral area. Requires debridement of surface slough and nonviable subcutaneous tissue. There is a small amount of undermining noted no evidence of infection. On the right side of this wound there is some senescent rolled edges which may need debridement I did not do this today. 09/01/15 this is a small wound over the lower sacral area. It has tunneling superiorly. This goes roughly 1 cm. Nonviable subcutaneous tissue at the inferior aspect of the wound base. No real evidence of infection 09/07/15; small  horizontal wound over the lower sacral area. Last week it had 1 cm of superior tunneling which appears to of resolved. No evidence of infection 09/21/15; continues to have a small horizontal wound over the lower sacral area. We have been using Prisma and this has contracted. He tells me that his mother is aware medication not able to change the dressing although he states that he is able to do this 09/28/15; a small horizontal wound over his lower sacral area is all that is left of this wound which was probably much larger and deeper at  one point. Depth today at 0.7 cm. Base of this had some nonviable subcutaneous tissue that I removed. Appears to be stalling on current dressing which is Prisma 10/19/15; I have not seen this patient in several weeks. He arrives today with a linear/horizontal wound across his lower sacral area. Considerable depth of this wound. Extensive debridement done 11/17/15; patient arrives for two-week visit. He is not doing well wound is bigger with odor and drainage. Has LEEANDRE, NORDLING ([id number]) been using Aquacel Ag. I have not seen this in over a month although I think he was seen by the physician covering me 2 weeks ago on 11/05/15 11/24/15; I brought this man back this week to review the wound after last week's deterioration. Culture grew Enterobacter and some staph aureus [MSSA]. I had ordered him Cipro but we don't seem to of been able to communicate with the patient therefore we've called in today. He is not been systemically unwell. Still using Aquacel 12/15/15; very little change in this wound since the last time I saw it. Horizontal wound over the lower sacral area probably about 2 cm in width and 0.8 cm in depth. There is no evidence of infection. There appears to be callus around this. In spite of the patient's best efforts I think there is an adequate pressure relief here. No current evidence of infection 12/22/15; horizontal pressure ulcer over the lower sacral  area. Appears to be filling in and has better dimensions in appearance today. Still circumferential callus which would suggest inadequate offloading although everything else appears to be improved 12/29/15 horizontal pressure wound over the lower sacral area. Appears to be filling in however skin groin down into the divot. No evidence of infection 01/05/16; comes in today with not much change in this wound. There is surrounding callus indicative of pressure. Not much change in the wound bed. This clearly require debridement today we have been using Hydrofera Blue 02/04/16: returns today after an absence. no systemic s/s of infection. once again, periwound callus is present suggesting pressure. 02/18/2016 -- she was away for 2 weeks traveling to [place] and during that time he had a new pressure injury to the left ischial tuberosity which he thinks may have been caused while transferring. 03-23-2016; Mr. [place] returns today, after missing last week's appointment, for evaluation of his left buttock stage III pressure ulcer. There is no ulceration to the left ischial tuberosity at this appointment. He states that he has been changing the dressing to his ulcer daily due to daily showering. He voices no complaints or concerns and denies benign antibiotic therapy for any reason at this point. 03/30/2016 -- the patient has been doing fine and after debridement of his wound today I believe is ready for a skin substitute and he has been previously approved for grafix and we will try and obtain this from next week Objective Constitutional Pulse regular. Respirations normal and unlabored. Afebrile. Vitals Time Taken: 10:38 AM, Height: 67 in, Weight: 147 lbs, BMI: 23, Temperature: 97.5 F, Pulse: 60 bpm, Respiratory Rate: 18 breaths/min, Blood Pressure: 104/60 mmHg. Eyes Nonicteric. Reactive to light. Ears, Nose, Mouth, and Throat Lips, teeth, and gums WNL.Marland Kitchen Moist mucosa without lesions. [name], [name]  ([id number]) Neck supple and nontender. No palpable supraclavicular or cervical adenopathy. Normal sized without goiter. Respiratory WNL. No retractions.. Breath sounds WNL, No rubs, rales, rhonchi, or wheeze.. Cardiovascular Heart rhythm and rate regular, no murmur or gallop.. Pedal Pulses WNL. No clubbing, cyanosis or edema. Lymphatic No adneopathy. No adenopathy. No  adenopathy. Musculoskeletal Adexa without tenderness or enlargement.. Digits and nails w/o clubbing, cyanosis, infection, petechiae, ischemia, or inflammatory conditions.Marland Kitchen Psychiatric Judgement and insight Intact.. No evidence of depression, anxiety, or agitation.. General Notes: sharp debridement of the wound base and the surrounding skin was done and minimal bleeding controlled with pressure Integumentary (Hair, Skin) No suspicious lesions. No crepitus or fluctuance. No peri-wound warmth or erythema. No masses.. Wound #1 status is Open. Original cause of wound was Pressure Injury. The wound is located on the Left Sacrum. The wound measures 1cm length x 1.9cm width x 0.2cm depth; 1.492cm^2 area and 0.298cm^3 volume. There is fat exposed. There is no tunneling or undermining noted. There is a large amount of serous drainage noted. The wound margin is thickened. There is medium (34-66%) pink granulation within the wound bed. There is a medium (34-66%) amount of necrotic tissue within the wound bed including Adherent Slough. The periwound skin appearance exhibited: Callus, Maceration, Moist. The periwound skin appearance did not exhibit: Dry/Scaly. Periwound temperature was noted as No Abnormality. Assessment Active Problems ICD-10 L89.323 - Pressure ulcer of left buttock, stage 3 G82.22 - Paraplegia, incomplete [name], [name] ([id number]) Procedures Wound #1 Wound #1 is a Pressure Ulcer located on the Left Sacrum . There was a Skin/Subcutaneous Tissue Debridement (52841-32440) debridement with total area of 1.9 sq cm  performed by Evlyn Kanner, MD. with the following instrument(s): Curette to remove Viable and Non-Viable tissue/material including Exudate, Fibrin/Slough, and Subcutaneous after achieving pain control using Lidocaine 4% Topical Solution. A time out was conducted at 10:27, prior to the start of the procedure. A Minimum amount of bleeding was controlled with Pressure. The procedure was tolerated well with a pain level of 0 throughout and a pain level of 0 following the procedure. Post Debridement Measurements: 1cm length x 1.9cm width x 0.3cm depth; 0.448cm^3 volume. Post debridement Stage noted as Category/Stage III. Character of Wound/Ulcer Post Debridement requires further debridement. Severity of Tissue Post Debridement is: Fat layer exposed. Post procedure Diagnosis Wound #1: Same as Pre-Procedure Plan Wound Cleansing: Wound #1 Left Sacrum: Clean wound with Normal Saline. Anesthetic: Wound #1 Left Sacrum: Topical Lidocaine 4% cream applied to wound bed prior to debridement Skin Barriers/Peri-Wound Care: Wound #1 Left Sacrum: Skin Prep Primary Wound Dressing: Wound #1 Left Sacrum: Iodoflex Secondary Dressing: Wound #1 Left Sacrum: Foam Tegaderm Dressing Change Frequency: Wound #1 Left Sacrum: Change dressing every other day. Follow-up Appointments: Wound #1 Left Sacrum: Return Appointment in 2 weeks. Off-Loading: Wound #1 Left Sacrum: Turn and reposition every 2 hours Additional Orders / Instructions: Wound #1 Left SacrumARTEMUS, ROMANOFF ([id number]) Increase protein intake. Medications-please add to medication list.: Wound #1 Left Sacrum: Other: - Vitamin C, A and Zinc General Notes: Order Grafix for next week. I last him to continue with the isosorb or iodoflex to be applied every other day and to continue offloading and adequate protein and vitamin supplements. we will try and obtain grafix for the wound as he has been previously authorized and I believe he is  ready for it Electronic Signature(s) Signed: 03/30/2016 10:45:01 AM By: Evlyn Kanner MD, FACS Entered By: Evlyn Kanner on 03/30/2016 10:45:00 [name] ([id number]) -------------------------------------------------------------------------------- SuperBill Details Patient Name: [name] Date of Service: 03/30/2016 Medical Record Number: [id number] Patient Account Number: [id number] Date of Birth/Sex: [birth year] (45 y.o. Male) Treating RN: Phillis Haggis Primary Care Physician: PATIENT, NO Other Clinician: Referring Physician: Treating Physician/Extender: Rudene Re in Treatment: 32 Diagnosis Coding ICD-10 Codes Code Description [phone] Pressure ulcer of  left buttock, stage 3 G82.22 Paraplegia, incomplete Facility Procedures CPT4 Code: 16109604 Description: 11042 - DEB SUBQ TISSUE 20 SQ CM/< ICD-10 Description Diagnosis L89.323 Pressure ulcer of left buttock, stage 3 G82.22 Paraplegia, incomplete Modifier: Quantity: 1 Physician Procedures CPT4 Code: 5409811 Description: 11042 - WC PHYS SUBQ TISS 20 SQ CM ICD-10 Description Diagnosis L89.323 Pressure ulcer of left buttock, stage 3 G82.22 Paraplegia, incomplete Modifier: Quantity: 1 Electronic Signature(s) Signed: 03/30/2016 10:45:11 AM By: Evlyn Kanner MD, FACS Entered By: Evlyn Kanner on 03/30/2016 10:45:11

## 2016-03-31 NOTE — Progress Notes (Signed)
[name], Drevion ([id number]) Visit Report for 03/30/2016 Arrival Information Details Patient Name: [name], Hairo Date of Service: 03/30/2016 9:45 AM Medical Record Number: [id number] Patient Account Number: [id number] Date of Birth/Sex: 08-03-1970 [age] y.o. Male) Treating RN: Phillis HaggisPinkerton, Debi Primary Care Physician: PATIENT, NO Other Clinician: Referring Physician: Treating Physician/Extender: Rudene ReBritto, Errol Weeks in Treatment: 32 Visit Information History Since Last Visit All ordered tests and consults were completed: No Patient Arrived: Wheel Chair Added or deleted any medications: No Arrival Time: 10:08 Any new allergies or adverse reactions: No Accompanied By: self Had a fall or experienced change in No activities of daily living that may affect Transfer Assistance: None risk of falls: Patient Identification Verified: Yes Signs or symptoms of abuse/neglect since last No Secondary Verification Process Yes visito Completed: Hospitalized since last visit: No Patient Requires Transmission-Based No Pain Present Now: No Precautions: Patient Has Alerts: No Electronic Signature(s) Signed: 03/30/2016 5:08:32 PM By: Alejandro MullingPinkerton, Debra Entered By: Alejandro MullingPinkerton, Debra on 03/30/2016 10:08:41 [name], Jaree ([id number]) -------------------------------------------------------------------------------- Encounter Discharge Information Details Patient Name: [name], Lilton Date of Service: 03/30/2016 9:45 AM Medical Record Number: [id number] Patient Account Number: [id number] Date of Birth/Sex: 08-03-1970 [age] y.o. Male) Treating RN: Phillis HaggisPinkerton, Debi Primary Care Physician: PATIENT, NO Other Clinician: Referring Physician: Treating Physician/Extender: Rudene ReBritto, Errol Weeks in Treatment: 4032 Encounter Discharge Information Items Discharge Pain Level: 0 Discharge Condition: Stable Ambulatory Status: Wheelchair Discharge Destination: Home Transportation: Private Auto Accompanied By: self Schedule  Follow-up Appointment: Yes Medication Reconciliation completed Yes and provided to Patient/Care Angelin Cutrone: Patient Clinical Summary of Care: Declined Electronic Signature(s) Signed: 03/30/2016 10:47:04 AM By: Gwenlyn PerkingMoore, Shelia Previous Signature: 03/30/2016 10:46:42 AM Version By: Gwenlyn PerkingMoore, Shelia Entered By: Gwenlyn PerkingMoore, Shelia on 03/30/2016 10:47:04 [name], Tovia ([id number]) -------------------------------------------------------------------------------- Lower Extremity Assessment Details Patient Name: [name], Tylen Date of Service: 03/30/2016 9:45 AM Medical Record Number: [id number] Patient Account Number: [id number] Date of Birth/Sex: 08-03-1970 [age] y.o. Male) Treating RN: Phillis HaggisPinkerton, Debi Primary Care Physician: PATIENT, NO Other Clinician: Referring Physician: Treating Physician/Extender: Rudene ReBritto, Errol Weeks in Treatment: 32 Electronic Signature(s) Signed: 03/30/2016 5:08:32 PM By: Alejandro MullingPinkerton, Debra Entered By: Alejandro MullingPinkerton, Debra on 03/30/2016 10:08:57 [name], Issiac ([id number]) -------------------------------------------------------------------------------- Multi Wound Chart Details Patient Name: [name], Tremaine Date of Service: 03/30/2016 9:45 AM Medical Record Number: [id number] Patient Account Number: [id number] Date of Birth/Sex: 08-03-1970 [age] y.o. Male) Treating RN: Phillis HaggisPinkerton, Debi Primary Care Physician: PATIENT, NO Other Clinician: Referring Physician: Treating Physician/Extender: Rudene ReBritto, Errol Weeks in Treatment: 32 Photos: [1:No Photos] [N/A:N/A] Wound Location: [1:Left Sacrum] [N/A:N/A] Wounding Event: [1:Pressure Injury] [N/A:N/A] Primary Etiology: [1:Pressure Ulcer] [N/A:N/A] Comorbid History: [1:Paraplegia] [N/A:N/A] Date Acquired: [1:12/24/2014] [N/A:N/A] Weeks of Treatment: [1:32] [N/A:N/A] Wound Status: [1:Open] [N/A:N/A] Measurements L x W x D 1x1.9x0.2 [N/A:N/A] (cm) Area (cm) : [1:1.492] [N/A:N/A] Volume (cm) : [1:0.298] [N/A:N/A] % Reduction in Area:  [1:-40.80%] [N/A:N/A] % Reduction in Volume: 43.80% [N/A:N/A] Classification: [1:Category/Stage III] [N/A:N/A] Exudate Amount: [1:Large] [N/A:N/A] Exudate Type: [1:Serous] [N/A:N/A] Exudate Color: [1:amber] [N/A:N/A] Foul Odor After [1:Yes] [N/A:N/A] Cleansing: Odor Anticipated Due to No [N/A:N/A] Product Use: Wound Margin: [1:Thickened] [N/A:N/A] Granulation Amount: [1:Medium (34-66%)] [N/A:N/A] Granulation Quality: [1:Pink] [N/A:N/A] Necrotic Amount: [1:Medium (34-66%)] [N/A:N/A] Exposed Structures: [1:Fat: Yes Fascia: No Tendon: No Muscle: No Joint: No Bone: No] [N/A:N/A] Epithelialization: [1:None] [N/A:N/A] Periwound Skin Texture: Callus: Yes [N/A:N/A] Periwound Skin [1:Maceration: Yes] [N/A:N/A] Moisture: [1:Moist: Yes Dry/Scaly: No] Periwound Skin Color: No Abnormalities Noted [N/A:N/A] Temperature: No Abnormality N/A N/A Tenderness on No N/A N/A Palpation: Wound Preparation: Ulcer Cleansing: N/A N/A Rinsed/Irrigated with Saline Topical Anesthetic Applied: Other: lidocaine 4% Treatment Notes Electronic Signature(s) Signed:  03/30/2016 5:08:32 PM By: Alejandro Mulling Entered By: Alejandro Mulling on 03/30/2016 10:14:25 Matthew Evangelist ([id number]) -------------------------------------------------------------------------------- Multi-Disciplinary Care Plan Details Patient Name: Matthew Evangelist Date of Service: 03/30/2016 9:45 AM Medical Record Number: [id number] Patient Account Number: [id number] Date of Birth/Sex: 06-Jun-1970 (46 y.o. Male) Treating RN: Phillis Haggis Primary Care Physician: PATIENT, NO Other Clinician: Referring Physician: Treating Physician/Extender: Rudene Re in Treatment: 71 Active Inactive Abuse / Safety / Falls / Self Care Management Nursing Diagnoses: Potential for falls Goals: Patient will remain injury free Date Initiated: 08/17/2015 Goal Status: Active Interventions: Assess fall risk on admission and as  needed Notes: Nutrition Nursing Diagnoses: Imbalanced nutrition Goals: Patient/caregiver agrees to and verbalizes understanding of need to use nutritional supplements and/or vitamins as prescribed Date Initiated: 08/17/2015 Goal Status: Active Interventions: Assess patient nutrition upon admission and as needed per policy Notes: Orientation to the Wound Care Program Nursing Diagnoses: Knowledge deficit related to the wound healing center program Goals: Patient/caregiver will verbalize understanding of the Wound Healing Center Program GLENWOOD, REVOIR ([id number]) Date Initiated: 08/17/2015 Goal Status: Active Interventions: Provide education on orientation to the wound center Notes: Pressure Nursing Diagnoses: Knowledge deficit related to causes and risk factors for pressure ulcer development Goals: Patient will remain free from development of additional pressure ulcers Date Initiated: 08/17/2015 Goal Status: Active Interventions: Assess: immobility, friction, shearing, incontinence upon admission and as needed Notes: Wound/Skin Impairment Nursing Diagnoses: Impaired tissue integrity Goals: Ulcer/skin breakdown will have a volume reduction of 30% by week 4 Date Initiated: 08/17/2015 Goal Status: Active Ulcer/skin breakdown will have a volume reduction of 50% by week 8 Date Initiated: 08/17/2015 Goal Status: Active Ulcer/skin breakdown will have a volume reduction of 80% by week 12 Date Initiated: 08/17/2015 Goal Status: Active Interventions: Assess patient/caregiver ability to obtain necessary supplies Assess ulceration(s) every visit Notes: Electronic Signature(s) Signed: 03/30/2016 5:08:32 PM By: Servando Snare, Leonette Most ([id number]) Entered By: Alejandro Mulling on 03/30/2016 10:14:18 Matthew Evangelist ([id number]) -------------------------------------------------------------------------------- Pain Assessment Details Patient Name: Matthew Evangelist Date of  Service: 03/30/2016 9:45 AM Medical Record Number: [id number] Patient Account Number: [id number] Date of Birth/Sex: [birth year] (45 y.o. Male) Treating RN: Phillis Haggis Primary Care Physician: PATIENT, NO Other Clinician: Referring Physician: Treating Physician/Extender: Rudene Re in Treatment: 32 Active Problems Location of Pain Severity and Description of Pain Patient Has Paino No Site Locations With Dressing Change: No Pain Management and Medication Current Pain Management: Electronic Signature(s) Signed: 03/30/2016 5:08:32 PM By: Alejandro Mulling Entered By: Alejandro Mulling on 03/30/2016 10:08:49 Matthew Evangelist ([id number]) -------------------------------------------------------------------------------- Patient/Caregiver Education Details Patient Name: Matthew Evangelist Date of Service: 03/30/2016 9:45 AM Medical Record Number: [id number] Patient Account Number: [id number] Date of Birth/Gender: Dec 29, 1970 (45 y.o. Male) Treating RN: Phillis Haggis Primary Care Physician: PATIENT, NO Other Clinician: Referring Physician: Treating Physician/Extender: Rudene Re in Treatment: 72 Education Assessment Education Provided To: Patient Education Topics Provided Wound/Skin Impairment: Handouts: Other: change dressing as ordered Methods: Demonstration, Explain/Verbal Responses: State content correctly Electronic Signature(s) Signed: 03/30/2016 5:08:32 PM By: Alejandro Mulling Entered By: Alejandro Mulling on 03/30/2016 10:35:44 Matthew Evangelist ([id number]) -------------------------------------------------------------------------------- Wound Assessment Details Patient Name: Matthew Evangelist Date of Service: 03/30/2016 9:45 AM Medical Record Number: [id number] Patient Account Number: [id number] Date of Birth/Sex: 1971/04/20 (45 y.o. Male) Treating RN: Phillis Haggis Primary Care Physician: PATIENT, NO Other Clinician: Referring Physician: Treating  Physician/Extender: Rudene Re in Treatment: 32 Wound Status Wound Number: 1 Primary Etiology: Pressure Ulcer Wound Location: Left Sacrum Wound Status: Open Wounding Event: Pressure Injury Comorbid History: Paraplegia Date Acquired:  12/24/2014 Weeks Of Treatment: 32 Clustered Wound: No Photos Photo Uploaded By: Alejandro MullingPinkerton, Debra on 03/30/2016 11:13:18 Wound Measurements Length: (cm) 1 Width: (cm) 1.9 Depth: (cm) 0.2 Area: (cm) 1.492 Volume: (cm) 0.298 % Reduction in Area: -40.8% % Reduction in Volume: 43.8% Epithelialization: None Tunneling: No Undermining: No Wound Description Classification: Category/Stage III Foul Odor Afte Wound Margin: Thickened Due to Product Exudate Amount: Large Exudate Type: Serous Exudate Color: amber r Cleansing: Yes Use: No Wound Bed Granulation Amount: Medium (34-66%) Exposed Structure Granulation Quality: Pink Fascia Exposed: No Necrotic Amount: Medium (34-66%) Fat Layer Exposed: Yes Necrotic Quality: Adherent Slough Tendon Exposed: No [name], Siddhartha ([id number]) Muscle Exposed: No Joint Exposed: No Bone Exposed: No Periwound Skin Texture Texture Color No Abnormalities Noted: No No Abnormalities Noted: No Callus: Yes Temperature / Pain Moisture Temperature: No Abnormality No Abnormalities Noted: No Dry / Scaly: No Maceration: Yes Moist: Yes Wound Preparation Ulcer Cleansing: Rinsed/Irrigated with Saline Topical Anesthetic Applied: Other: lidocaine 4%, Treatment Notes Wound #1 (Left Sacrum) 1. Cleansed with: Clean wound with Normal Saline 2. Anesthetic Topical Lidocaine 4% cream to wound bed prior to debridement 3. Peri-wound Care: Skin Prep 4. Dressing Applied: Iodoflex 5. Secondary Dressing Applied Foam Tegaderm Electronic Signature(s) Signed: 03/30/2016 5:08:32 PM By: Alejandro MullingPinkerton, Debra Entered By: Alejandro MullingPinkerton, Debra on 03/30/2016 10:14:00 [name], Philipe  ([id number]) -------------------------------------------------------------------------------- Vitals Details Patient Name: [name], Clay Date of Service: 03/30/2016 9:45 AM Medical Record Number: [id number] Patient Account Number: [id number] Date of Birth/Sex: February 01, 1971 [age] y.o. Male) Treating RN: Phillis HaggisPinkerton, Debi Primary Care Physician: PATIENT, NO Other Clinician: Referring Physician: Treating Physician/Extender: Rudene ReBritto, Errol Weeks in Treatment: 32 Vital Signs Time Taken: 10:38 Temperature (F): 97.5 Height (in): 67 Pulse (bpm): 60 Weight (lbs): 147 Respiratory Rate (breaths/min): 18 Body Mass Index (BMI): 23 Blood Pressure (mmHg): 104/60 Reference Range: 80 - 120 mg / dl Electronic Signature(s) Signed: 03/30/2016 5:08:32 PM By: Alejandro MullingPinkerton, Debra Entered By: Alejandro MullingPinkerton, Debra on 03/30/2016 10:40:18

## 2016-04-05 ENCOUNTER — Ambulatory Visit: Payer: Medicare Other | Admitting: Physical Therapy

## 2016-04-06 ENCOUNTER — Encounter: Payer: Medicare Other | Admitting: Surgery

## 2016-04-06 DIAGNOSIS — L89323 Pressure ulcer of left buttock, stage 3: Secondary | ICD-10-CM | POA: Diagnosis not present

## 2016-04-06 DIAGNOSIS — L89153 Pressure ulcer of sacral region, stage 3: Secondary | ICD-10-CM | POA: Diagnosis not present

## 2016-04-06 DIAGNOSIS — Z8249 Family history of ischemic heart disease and other diseases of the circulatory system: Secondary | ICD-10-CM | POA: Diagnosis not present

## 2016-04-06 DIAGNOSIS — G8222 Paraplegia, incomplete: Secondary | ICD-10-CM | POA: Diagnosis not present

## 2016-04-06 DIAGNOSIS — F17210 Nicotine dependence, cigarettes, uncomplicated: Secondary | ICD-10-CM | POA: Diagnosis not present

## 2016-04-07 ENCOUNTER — Ambulatory Visit (INDEPENDENT_AMBULATORY_CARE_PROVIDER_SITE_OTHER): Payer: Medicare Other | Admitting: Psychology

## 2016-04-07 DIAGNOSIS — F3132 Bipolar disorder, current episode depressed, moderate: Secondary | ICD-10-CM

## 2016-04-07 NOTE — Progress Notes (Signed)
[name], [name] ([id number]) Visit Report for 04/06/2016 Arrival Information Details Patient Name: [name], [name] Date of Service: 04/06/2016 12:45 PM Medical Record Number: [id number] Patient Account Number: [id number] Date of Birth/Sex: 07-05-1970 [age] y.o. Male) Treating RN: Phillis HaggisPinkerton, Debi Primary Care Physician: PATIENT, NO Other Clinician: Referring Physician: Treating Physician/Extender: Rudene ReBritto, Errol Weeks in Treatment: 4633 Visit Information History Since Last Visit All ordered tests and consults were completed: No Patient Arrived: Wheel Chair Added or deleted any medications: No Arrival Time: 13:04 Any new allergies or adverse reactions: No Accompanied By: self Had a fall or experienced change in No activities of daily living that may affect Transfer Assistance: Other risk of falls: Patient Identification Verified: Yes Signs or symptoms of abuse/neglect since last No Secondary Verification Process Yes visito Completed: Hospitalized since last visit: No Patient Requires Transmission-Based No Has Dressing in Place as Prescribed: Yes Precautions: Pain Present Now: No Patient Has Alerts: No Electronic Signature(s) Signed: 04/06/2016 4:30:33 PM By: Alejandro MullingPinkerton, Debra Entered By: Alejandro MullingPinkerton, Debra on 04/06/2016 13:04:47 [name], [name] ([id number]) -------------------------------------------------------------------------------- Encounter Discharge Information Details Patient Name: [name], [name] Date of Service: 04/06/2016 12:45 PM Medical Record Number: [id number] Patient Account Number: [id number] Date of Birth/Sex: 07-05-1970 [age] y.o. Male) Treating RN: Phillis HaggisPinkerton, Debi Primary Care Physician: PATIENT, NO Other Clinician: Referring Physician: Treating Physician/Extender: Rudene ReBritto, Errol Weeks in Treatment: 5733 Encounter Discharge Information Items Discharge Pain Level: 0 Discharge Condition: Stable Ambulatory Status: Wheelchair Discharge Destination:  Home Transportation: Private Auto Accompanied By: self Schedule Follow-up Appointment: Yes Medication Reconciliation completed and provided to Patient/Care Yes Kyilee Gregg: Provided on Clinical Summary of Care: 04/06/2016 Form Type Recipient Paper [facility] Signature(s) Signed: 04/06/2016 1:35:52 PM By: Gwenlyn PerkingMoore, Shelia Entered By: Gwenlyn PerkingMoore, Shelia on 04/06/2016 13:35:52 [name], Orton ([id number]) -------------------------------------------------------------------------------- Lower Extremity Assessment Details Patient Name: [name], [name] Date of Service: 04/06/2016 12:45 PM Medical Record Number: [id number] Patient Account Number: [id number] Date of Birth/Sex: 07-05-1970 [age] y.o. Male) Treating RN: Phillis HaggisPinkerton, Debi Primary Care Physician: PATIENT, NO Other Clinician: Referring Physician: Treating Physician/Extender: Rudene ReBritto, Errol Weeks in Treatment: 33 Electronic Signature(s) Signed: 04/06/2016 4:30:33 PM By: Alejandro MullingPinkerton, Debra Entered By: Alejandro MullingPinkerton, Debra on 04/06/2016 13:06:52 [name], [name] ([id number]) -------------------------------------------------------------------------------- Multi Wound Chart Details Patient Name: [name], [name] Date of Service: 04/06/2016 12:45 PM Medical Record Number: [id number] Patient Account Number: [id number] Date of Birth/Sex: 07-05-1970 [age] y.o. Male) Treating RN: Phillis HaggisPinkerton, Debi Primary Care Physician: PATIENT, NO Other Clinician: Referring Physician: Treating Physician/Extender: Rudene ReBritto, Errol Weeks in Treatment: 33 Vital Signs Height(in): 67 Pulse(bpm): 100 Weight(lbs): 147 Blood Pressure 128/83 (mmHg): Body Mass Index(BMI): 23 Temperature(F): 98.3 Respiratory Rate 18 (breaths/min): Photos: [1:No Photos] [N/A:N/A] Wound Location: [1:Left Sacrum] [N/A:N/A] Wounding Event: [1:Pressure Injury] [N/A:N/A] Primary Etiology: [1:Pressure Ulcer] [N/A:N/A] Comorbid History: [1:Paraplegia] [N/A:N/A] Date Acquired: [1:12/24/2014]  [N/A:N/A] Weeks of Treatment: [1:33] [N/A:N/A] Wound Status: [1:Open] [N/A:N/A] Measurements L x W x D 0.9x1.4x0.2 [N/A:N/A] (cm) Area (cm) : [1:0.99] [N/A:N/A] Volume (cm) : [1:0.198] [N/A:N/A] % Reduction in Area: [1:6.60%] [N/A:N/A] % Reduction in Volume: 62.60% [N/A:N/A] Classification: [1:Category/Stage III] [N/A:N/A] Exudate Amount: [1:Large] [N/A:N/A] Exudate Type: [1:Serous] [N/A:N/A] Exudate Color: [1:amber] [N/A:N/A] Foul Odor After [1:Yes] [N/A:N/A] Cleansing: Odor Anticipated Due to No [N/A:N/A] Product Use: Wound Margin: [1:Thickened] [N/A:N/A] Granulation Amount: [1:Medium (34-66%)] [N/A:N/A] Granulation Quality: [1:Pink] [N/A:N/A] Necrotic Amount: [1:Medium (34-66%)] [N/A:N/A] Exposed Structures: [1:Fat: Yes Fascia: No Tendon: No Muscle: No] [N/A:N/A] Joint: No Bone: No Epithelialization: None N/A N/A Periwound Skin Texture: Callus: Yes N/A N/A Periwound Skin Maceration: Yes N/A N/A Moisture: Moist: Yes Dry/Scaly: No Periwound Skin Color: No Abnormalities Noted N/A N/A  Temperature: No Abnormality N/A N/A Tenderness on No N/A N/A Palpation: Wound Preparation: Ulcer Cleansing: N/A N/A Rinsed/Irrigated with Saline Topical Anesthetic Applied: Other: lidocaine 4% Treatment Notes Electronic Signature(s) Signed: 04/06/2016 4:30:33 PM By: Alejandro MullingPinkerton, Debra Entered By: Alejandro MullingPinkerton, Debra on 04/06/2016 13:13:40 [name], [name] ([id number]) -------------------------------------------------------------------------------- Multi-Disciplinary Care Plan Details Patient Name: [name], Jsiah Date of Service: 04/06/2016 12:45 PM Medical Record Number: [id number] Patient Account Number: [id number] Date of Birth/Sex: [birth year] [age] y.o. Male) Treating RN: Phillis HaggisPinkerton, Debi Primary Care Physician: PATIENT, NO Other Clinician: Referring Physician: Treating Physician/Extender: Rudene ReBritto, Errol Weeks in Treatment: 4233 Active Inactive Abuse / Safety / Falls / Self Care  Management Nursing Diagnoses: Potential for falls Goals: Patient will remain injury free Date Initiated: 08/17/2015 Goal Status: Active Interventions: Assess fall risk on admission and as needed Notes: Nutrition Nursing Diagnoses: Imbalanced nutrition Goals: Patient/caregiver agrees to and verbalizes understanding of need to use nutritional supplements and/or vitamins as prescribed Date Initiated: 08/17/2015 Goal Status: Active Interventions: Assess patient nutrition upon admission and as needed per policy Notes: Orientation to the Wound Care Program Nursing Diagnoses: Knowledge deficit related to the wound healing center program Goals: Patient/caregiver will verbalize understanding of the Wound Healing Center Program [name], Mynor ([id number]) Date Initiated: 08/17/2015 Goal Status: Active Interventions: Provide education on orientation to the wound center Notes: Pressure Nursing Diagnoses: Knowledge deficit related to causes and risk factors for pressure ulcer development Goals: Patient will remain free from development of additional pressure ulcers Date Initiated: 08/17/2015 Goal Status: Active Interventions: Assess: immobility, friction, shearing, incontinence upon admission and as needed Notes: Wound/Skin Impairment Nursing Diagnoses: Impaired tissue integrity Goals: Ulcer/skin breakdown will have a volume reduction of 30% by week 4 Date Initiated: 08/17/2015 Goal Status: Active Ulcer/skin breakdown will have a volume reduction of 50% by week 8 Date Initiated: 08/17/2015 Goal Status: Active Ulcer/skin breakdown will have a volume reduction of 80% by week 12 Date Initiated: 08/17/2015 Goal Status: Active Interventions: Assess patient/caregiver ability to obtain necessary supplies Assess ulceration(s) every visit Notes: Electronic Signature(s) Signed: 04/06/2016 4:30:33 PM By: Servando SnarePinkerton, Debra Mitch, Leonette MostHARLES ([id number]) Entered By: Alejandro MullingPinkerton, Debra on  04/06/2016 13:13:35 [name], Kosei ([id number]) -------------------------------------------------------------------------------- Pain Assessment Details Patient Name: [name], Alba Date of Service: 04/06/2016 12:45 PM Medical Record Number: [id number] Patient Account Number: [id number] Date of Birth/Sex: [birth year] [age] y.o. Male) Treating RN: Phillis HaggisPinkerton, Debi Primary Care Physician: PATIENT, NO Other Clinician: Referring Physician: Treating Physician/Extender: Rudene ReBritto, Errol Weeks in Treatment: 33 Active Problems Location of Pain Severity and Description of Pain Patient Has Paino No Site Locations With Dressing Change: No Pain Management and Medication Current Pain Management: Electronic Signature(s) Signed: 04/06/2016 4:30:33 PM By: Alejandro MullingPinkerton, Debra Entered By: Alejandro MullingPinkerton, Debra on 04/06/2016 13:05:01 [name], Alvey ([id number]) -------------------------------------------------------------------------------- Patient/Caregiver Education Details Patient Name: [name], Johnthomas Date of Service: 04/06/2016 12:45 PM Medical Record Number: [id number] Patient Account Number: [id number] Date of Birth/Gender: [birth year] [age] y.o. Male) Treating RN: Phillis HaggisPinkerton, Debi Primary Care Physician: PATIENT, NO Other Clinician: Referring Physician: Treating Physician/Extender: Rudene ReBritto, Errol Weeks in Treatment: 3533 Education Assessment Education Provided To: Patient Education Topics Provided Wound/Skin Impairment: Handouts: Other: change dressing as ordered Methods: Demonstration, Explain/Verbal Responses: State content correctly Electronic Signature(s) Signed: 04/06/2016 4:30:33 PM By: Alejandro MullingPinkerton, Debra Entered By: Alejandro MullingPinkerton, Debra on 04/06/2016 13:20:08 [name], Keaundre ([id number]) -------------------------------------------------------------------------------- Wound Assessment Details Patient Name: [name], Xzavian Date of Service: 04/06/2016 12:45 PM Medical Record Number: [id number] Patient  Account Number: [id number] Date of Birth/Sex: [birth year] [age] y.o. Male) Treating RN: Phillis HaggisPinkerton, Debi Primary Care Physician: PATIENT, NO Other Clinician: Referring Physician: Treating Physician/Extender:  Britto, Errol Weeks in Treatment: 33 Wound Status Wound Number: 1 Primary Etiology: Pressure Ulcer Wound Location: Left Sacrum Wound Status: Open Wounding Event: Pressure Injury Comorbid History: Paraplegia Date Acquired: 12/24/2014 Weeks Of Treatment: 33 Clustered Wound: No Photos Photo Uploaded By: Alejandro Mulling on 04/06/2016 13:15:53 Wound Measurements Length: (cm) 0.9 Width: (cm) 1.4 Depth: (cm) 0.2 Area: (cm) 0.99 Volume: (cm) 0.198 % Reduction in Area: 6.6% % Reduction in Volume: 62.6% Epithelialization: None Tunneling: No Undermining: No Wound Description Classification: Category/Stage III Foul Odor Aft Wound Margin: Thickened Due to Produc Exudate Amount: Large Exudate Type: Serous Exudate Color: amber er Cleansing: Yes t Use: No Wound Bed Granulation Amount: Medium (34-66%) Exposed Structure Granulation Quality: Pink Fascia Exposed: No Necrotic Amount: Medium (34-66%) Fat Layer Exposed: Yes Necrotic Quality: Adherent Slough Tendon Exposed: No JOFFRE, LUCKS ([id number]) Muscle Exposed: No Joint Exposed: No Bone Exposed: No Periwound Skin Texture Texture Color No Abnormalities Noted: No No Abnormalities Noted: No Callus: Yes Temperature / Pain Moisture Temperature: No Abnormality No Abnormalities Noted: No Dry / Scaly: No Maceration: Yes Moist: Yes Wound Preparation Ulcer Cleansing: Rinsed/Irrigated with Saline Topical Anesthetic Applied: Other: lidocaine 4%, Treatment Notes Wound #1 (Left Sacrum) 1. Cleansed with: Clean wound with Normal Saline 2. Anesthetic Topical Lidocaine 4% cream to wound bed prior to debridement 3. Peri-wound Care: Skin Prep 4. Dressing Applied: Prisma Ag 5. Secondary Dressing  Applied Foam Tegaderm Electronic Signature(s) Signed: 04/06/2016 4:30:33 PM By: Alejandro Mulling Entered By: Alejandro Mulling on 04/06/2016 13:12:18 CHAMPION, CORALES ([id number]) -------------------------------------------------------------------------------- Vitals Details Patient Name: Matthew Evangelist Date of Service: 04/06/2016 12:45 PM Medical Record Number: [id number] Patient Account Number: [id number] Date of Birth/Sex: 1970/08/14 (45 y.o. Male) Treating RN: Phillis Haggis Primary Care Physician: PATIENT, NO Other Clinician: Referring Physician: Treating Physician/Extender: Rudene Re in Treatment: 33 Vital Signs Time Taken: 13:05 Temperature (F): 98.3 Height (in): 67 Pulse (bpm): 100 Weight (lbs): 147 Respiratory Rate (breaths/min): 18 Body Mass Index (BMI): 23 Blood Pressure (mmHg): 128/83 Reference Range: 80 - 120 mg / dl Electronic Signature(s) Signed: 04/06/2016 4:30:33 PM By: Alejandro Mulling Entered By: Alejandro Mulling on 04/06/2016 13:06:45

## 2016-04-07 NOTE — Progress Notes (Signed)
[name], [name] ([id number]) Visit Report for 04/06/2016 Chief Complaint Document Details Patient Name: [name], [name] Date of Service: 04/06/2016 12:45 PM Medical Record Number: [id number] Patient Account Number: [id number] Date of Birth/Sex: 1971-03-22 24(45 y.o. Male) Treating RN: [name], [name] Primary Care Physician: PATIENT, NO Other Clinician: Referring Physician: Treating Physician/Extender: Rudene ReBritto, Kennidy Lamke Weeks in Treatment: 2133 Information Obtained from: Patient Chief Complaint [name] presents today, after missing last week's appointment, for follow-up on his left buttock stage III pressure ulcer. Electronic Signature(s) Signed: 04/06/2016 1:48:38 PM By: Evlyn KannerBritto, Kelseigh Diver MD, FACS Entered By: Evlyn KannerBritto, Keaten Mashek on 04/06/2016 13:48:38 [name], [name] ([id number]) -------------------------------------------------------------------------------- Debridement Details Patient Name: [name], [name] Date of Service: 04/06/2016 12:45 PM Medical Record Number: [id number] Patient Account Number: [id number] Date of Birth/Sex: 1971-03-22 23(45 y.o. Male) Treating RN: [name], [name] Primary Care Physician: PATIENT, NO Other Clinician: Referring Physician: Treating Physician/Extender: Rudene ReBritto, Icel Castles Weeks in Treatment: 33 Debridement Performed for Wound #1 Left Sacrum Assessment: Performed By: Physician Evlyn KannerBritto, Makyla Bye, MD Debridement: Debridement Pre-procedure Yes - 11:17 Verification/Time Out Taken: Start Time: 11:18 Pain Control: Lidocaine 4% Topical Solution Level: Skin/Subcutaneous Tissue Total Area Debrided (L x 0.9 (cm) x 1.4 (cm) = 1.26 (cm) W): Tissue and other Viable, Non-Viable, Exudate, Fibrin/Slough, Subcutaneous material debrided: Instrument: Curette Bleeding: Minimum Hemostasis Achieved: Pressure End Time: 11:21 Procedural Pain: 0 Post Procedural Pain: 0 Response to Treatment: Procedure was tolerated well Post Debridement Measurements of Total Wound Length: (cm)  0.9 Stage: Category/Stage III Width: (cm) 1.4 Depth: (cm) 0.2 Volume: (cm) 0.198 Character of Wound/Ulcer Post Requires Further Debridement: Debridement Severity of Tissue Post Fat layer exposed Debridement: Post Procedure Diagnosis Same as Pre-procedure Electronic Signature(s) Signed: 04/06/2016 1:48:32 PM By: Evlyn KannerBritto, Deepti Gunawan MD, FACS Signed: 04/06/2016 4:30:33 PM By: Alejandro MullingPinkerton, Debra Previous Signature: 04/06/2016 1:48:20 PM Version By: Evlyn KannerBritto, Kymiah Araiza MD, FACS [name], [name] ([id number]) Entered By: Evlyn KannerBritto, Malin Cervini on 04/06/2016 13:48:31 [name], [name] ([id number]) -------------------------------------------------------------------------------- HPI Details Patient Name: [name], [name] Date of Service: 04/06/2016 12:45 PM Medical Record Number: [id number] Patient Account Number: [id number] Date of Birth/Sex: 1971-03-22 [age] y.o. 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 He was diagnosed with what sounds like a stage III pressure ulcer in September 2016 although this may have been present for some time longer than that perhaps July 2016. [place] By his description there were using Silvadene cream for a period of time and then 3 months ago change to Aquacel Ag which she is continued and changes daily with help from his mother. [place]. He went to urgent care to follow-up on his wound on 08/04/2015 they did a culture of this wound that showed Pseudomonas and MSSA. He was placed on an antibiotic but doesn't remember which one it is. He has continued using Aquacel Ag and a foam based cover. I don't have  any of his records from the wound care center in [place] although the patient states he did have an MRI that did not show osteomyelitis. He is not aware of having a bone culture done and doesn't think that he had exposed bone on this and any point. The patient does in and out cath's and has no urinary incontinence issues. He is on appropriate bowel regimen which  she administers suppositories. He is able to position himself appropriately and is wheelchair to try and avoid pressure over this wound 08/24/15; small horizontal wound over his lower sacral area. Requires debridement of surface slough and nonviable subcutaneous tissue. There is a small amount of undermining noted no evidence of infection. On the right side of this wound there is some senescent rolled edges which may need debridement I did not do this today. 09/01/15 this is a small wound over the lower sacral area. It has tunneling superiorly. This goes roughly 1 cm. Nonviable subcutaneous tissue at the inferior aspect of the wound base. No real evidence of infection 09/07/15; small horizontal wound over the lower sacral area. Last week it had 1 cm of superior tunneling which appears to of resolved. No evidence of infection 09/21/15; continues to have a small horizontal wound over the lower sacral area. We have been using Prisma and this has contracted. He tells me that his mother is aware medication not able to change the dressing although he states that he is able to do this 09/28/15; a small horizontal wound over his lower sacral area is all that is left of this wound which was probably much larger and deeper at one point. Depth today at 0.7 cm. Base of this had some nonviable subcutaneous tissue that I removed. Appears to be stalling on current dressing which is Prisma 10/19/15; I have not seen this patient in several weeks. He arrives today with a linear/horizontal wound across his lower sacral area. Considerable depth of this wound.  Extensive debridement done 11/17/15; patient arrives for two-week visit. He is not doing well wound is bigger with odor and drainage. Has been using Aquacel Ag. I have not seen this in over a month although I think he was seen by the physician covering me 2 weeks ago on 11/05/15 11/24/15; I brought this man back this week to review the wound after last week's deterioration. Culture grew Enterobacter and some staph aureus [MSSA]. I had ordered him Cipro but we don't seem to of been able to communicate with the patient therefore we've called in today. He is not been systemically unwell. Still using Aquacel 12/15/15; very little change in this wound since the last time I saw it. Horizontal wound over the lower sacral Zaragoza, Qualyn ([id number]) area probably about 2 cm in width and 0.8 cm in depth. There is no evidence of infection. There appears to be callus around this. In spite of the patient's best efforts I think there is an adequate pressure relief here. No current evidence of infection 12/22/15; horizontal pressure ulcer over the lower sacral area. Appears to be filling in and has better dimensions in appearance today. Still circumferential callus which would suggest inadequate offloading although everything else appears to be improved 12/29/15 horizontal pressure wound over the lower sacral area. Appears to be filling in however skin groin down into the divot. No evidence of infection 01/05/16; comes in today with not much change in this wound. There is surrounding callus indicative of pressure. Not much change in the wound bed. This clearly require debridement today we have been using Hydrofera Blue 02/04/16: returns today after an absence. no systemic s/s of infection. once again, periwound callus is present suggesting pressure. 02/18/2016 -- she was away for 2 weeks traveling to [place] and during that time he had a new pressure injury to the left ischial tuberosity which he thinks may have been  caused while transferring. 03-23-2016; [name] returns today, after  missing last week's appointment, for evaluation of his left buttock stage III pressure ulcer. There is no ulceration to the left ischial tuberosity at this appointment. He states that he has been changing the dressing to his ulcer daily due to daily showering. He voices no complaints or concerns and denies benign antibiotic therapy for any reason at this point. 03/30/2016 -- the patient has been doing fine and after debridement of his wound today I believe is ready for a skin substitute and he has been previously approved for grafix and we will try and obtain this from next week. 04/06/2016 -- the patient's insurance was run again and he had $275 copayment and he has declined the skin substitute. Electronic Signature(s) Signed: 04/06/2016 1:49:57 PM By: Evlyn Kanner MD, FACS Entered By: Evlyn Kanner on 04/06/2016 13:49:57 WILKIN, LIPPY ([id number]) -------------------------------------------------------------------------------- Physical Exam Details Patient Name: Matthew Evangelist Date of Service: 04/06/2016 12:45 PM Medical Record Number: [id number] Patient Account Number: [id number] Date of Birth/Sex: 04-May-1970 (45 y.o. Male) Treating RN: Matthew Haggis Primary Care Physician: PATIENT, NO Other Clinician: Referring Physician: Treating Physician/Extender: Rudene Re in Treatment: 33 Constitutional . Pulse regular. Respirations normal and unlabored. Afebrile. . Eyes Nonicteric. Reactive to light. Ears, Nose, Mouth, and Throat Lips, teeth, and gums WNL.Marland Kitchen Moist mucosa without lesions. Neck supple and nontender. No palpable supraclavicular or cervical adenopathy. Normal sized without goiter. Respiratory WNL. No retractions.. Breath sounds WNL, No rubs, rales, rhonchi, or wheeze.. Cardiovascular Heart rhythm and rate regular, no murmur or gallop.. Pedal Pulses WNL. No clubbing, cyanosis or  edema. Lymphatic No adneopathy. No adenopathy. No adenopathy. Musculoskeletal Adexa without tenderness or enlargement.. Digits and nails w/o clubbing, cyanosis, infection, petechiae, ischemia, or inflammatory conditions.. Integumentary (Hair, Skin) No suspicious lesions. No crepitus or fluctuance. No peri-wound warmth or erythema. No masses.Marland Kitchen Psychiatric Judgement and insight Intact.. No evidence of depression, anxiety, or agitation.. Notes the wound had some surrounding eschar and callus and the base of the wound had some debris and this was all sharply removed with a #3 curet and bleeding controlled with pressure. Electronic Signature(s) Signed: 04/06/2016 1:50:25 PM By: Evlyn Kanner MD, FACS Entered By: Evlyn Kanner on 04/06/2016 13:50:24 DUJUAN, STANKOWSKI ([id number]) -------------------------------------------------------------------------------- Physician Orders Details Patient Name: Matthew Evangelist Date of Service: 04/06/2016 12:45 PM Medical Record Number: [id number] Patient Account Number: [id number] Date of Birth/Sex: 03-24-1971 (45 y.o. Male) Treating RN: Matthew Haggis Primary Care Physician: PATIENT, NO Other Clinician: Referring Physician: Treating Physician/Extender: Rudene Re in Treatment: 45 Verbal / Phone Orders: Yes Clinician: Ashok Cordia, [name] Read Back and Verified: Yes Diagnosis Coding Wound Cleansing Wound #1 Left Sacrum o Clean wound with Normal Saline. Anesthetic Wound #1 Left Sacrum o Topical Lidocaine 4% cream applied to wound bed prior to debridement Skin Barriers/Peri-Wound Care Wound #1 Left Sacrum o Skin Prep Primary Wound Dressing Wound #1 Left Sacrum o Prisma Ag - moisten with saline Secondary Dressing Wound #1 Left Sacrum o Foam o Tegaderm Dressing Change Frequency Wound #1 Left Sacrum o Change dressing every other day. Follow-up Appointments Wound #1 Left Sacrum o Return Appointment in 2  weeks. Off-Loading Wound #1 Left Sacrum o Turn and reposition every 2 hours Additional Orders / Instructions BRAYCEN, BURANDT ([id number]) Wound #1 Left Sacrum o Increase protein intake. Medications-please add to medication list. Wound #1 Left Sacrum o Other: - Vitamin C, A and Zinc Electronic Signature(s) Signed: 04/06/2016 3:42:21 PM By: Evlyn Kanner MD, FACS Signed: 04/06/2016 4:30:33 PM By: Alejandro Mulling Entered By: Alejandro Mulling on 04/06/2016 13:25:03 Storer, Braian (  [id number]) -------------------------------------------------------------------------------- Problem List Details Patient Name: DASHIELL, FRANCHINO Date of Service: 04/06/2016 12:45 PM Medical Record Number: [id number] Patient Account Number: [id number] Date of Birth/Sex: [birth year] (45 y.o. Male) Treating RN: Matthew Haggis Primary Care Physician: PATIENT, NO Other Clinician: Referring Physician: Treating Physician/Extender: Rudene Re in Treatment: 48 Active Problems ICD-10 Encounter Code Description Active Date Diagnosis L89.323 Pressure ulcer of left buttock, stage 3 03/23/2016 Yes G82.22 Paraplegia, incomplete 08/17/2015 Yes Inactive Problems Resolved Problems ICD-10 Code Description Active Date Resolved Date L89.322 Pressure ulcer of left buttock, stage 2 02/18/2016 02/18/2016 L89.153 Pressure ulcer of sacral region, stage 3 08/17/2015 08/17/2015 Electronic Signature(s) Signed: 04/06/2016 1:48:11 PM By: Evlyn Kanner MD, FACS Entered By: Evlyn Kanner on 04/06/2016 13:48:11 JAIDON, ELLERY ([id number]) -------------------------------------------------------------------------------- Progress Note Details Patient Name: Matthew Evangelist Date of Service: 04/06/2016 12:45 PM Medical Record Number: [id number] Patient Account Number: [id number] Date of Birth/Sex: 1971/04/21 (45 y.o. Male) Treating RN: Matthew Haggis Primary Care Physician: PATIENT, NO Other Clinician: Referring  Physician: Treating Physician/Extender: Rudene Re in Treatment: 2 Subjective Chief Complaint Information obtained from Patient Mr. Gillingham presents today, after missing last week's appointment, for follow-up on his left buttock stage III pressure ulcer. 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 He was diagnosed with what sounds like a stage III pressure ulcer in September 2016 although this may have been present for some time longer than that perhaps July 2016. He was followed by wound care in [place]. By his description there were using Silvadene cream for a period of time and then 3 months ago change to Aquacel Ag which she is continued and changes daily with help from his mother. He has recently relocated to [facility]. He went to urgent care to follow- up on his wound on 08/04/2015 they did a culture of this wound that showed Pseudomonas and MSSA. He was placed on an antibiotic but doesn't remember which one it is. He has continued using Aquacel Ag and a foam based cover. I don't have any of his records from the wound care center in [place] although the patient states he did have an MRI that did not show osteomyelitis. He is not aware of having a bone culture done and doesn't think that he had exposed bone on this and any point. The patient does in and out cath's and has no urinary incontinence issues. He is on appropriate bowel regimen which she administers suppositories. He is able to position himself appropriately and is wheelchair to try and avoid pressure over this wound 08/24/15; small horizontal wound over his lower sacral area. Requires debridement of surface slough and nonviable subcutaneous tissue. There is a small amount of undermining noted no evidence of infection. On the right side of this wound there is some senescent rolled  edges which may need debridement I did not do this today. 09/01/15 this is a small wound over the lower sacral area. It has tunneling superiorly. This goes roughly 1 cm. Nonviable subcutaneous tissue at the inferior aspect of the wound base. No real evidence of infection 09/07/15; small horizontal wound over the lower sacral area. Last week it had 1 cm of superior tunneling which appears to of resolved. No evidence of infection 09/21/15; continues to have a small horizontal wound over the lower sacral area. We have been using Prisma and this has contracted. He tells me that his mother is  aware medication not able to change the dressing although he states that he is able to do this 09/28/15; a small horizontal wound over his lower sacral area is all that is left of this wound which was probably much larger and deeper at one point. Depth today at 0.7 cm. Base of this had some nonviable subcutaneous tissue that I removed. Appears to be stalling on current dressing which is Prisma 10/19/15; I have not seen this patient in several weeks. He arrives today with a linear/horizontal wound across his lower sacral area. Considerable depth of this wound. Extensive debridement done 11/17/15; patient arrives for two-week visit. He is not doing well wound is bigger with odor and drainage. Has JEVAUGHN, DEGOLLADO ([id number]) been using Aquacel Ag. I have not seen this in over a month although I think he was seen by the physician covering me 2 weeks ago on 11/05/15 11/24/15; I brought this man back this week to review the wound after last week's deterioration. Culture grew Enterobacter and some staph aureus [MSSA]. I had ordered him Cipro but we don't seem to of been able to communicate with the patient therefore we've called in today. He is not been systemically unwell. Still using Aquacel 12/15/15; very little change in this wound since the last time I saw it. Horizontal wound over the lower sacral area probably about 2 cm  in width and 0.8 cm in depth. There is no evidence of infection. There appears to be callus around this. In spite of the patient's best efforts I think there is an adequate pressure relief here. No current evidence of infection 12/22/15; horizontal pressure ulcer over the lower sacral area. Appears to be filling in and has better dimensions in appearance today. Still circumferential callus which would suggest inadequate offloading although everything else appears to be improved 12/29/15 horizontal pressure wound over the lower sacral area. Appears to be filling in however skin groin down into the divot. No evidence of infection 01/05/16; comes in today with not much change in this wound. There is surrounding callus indicative of pressure. Not much change in the wound bed. This clearly require debridement today we have been using Hydrofera Blue 02/04/16: returns today after an absence. no systemic s/s of infection. once again, periwound callus is present suggesting pressure. 02/18/2016 -- she was away for 2 weeks traveling to [place] and during that time he had a new pressure injury to the left ischial tuberosity which he thinks may have been caused while transferring. 03-23-2016; Mr. Lemelin returns today, after missing last week's appointment, for evaluation of his left buttock stage III pressure ulcer. There is no ulceration to the left ischial tuberosity at this appointment. He states that he has been changing the dressing to his ulcer daily due to daily showering. He voices no complaints or concerns and denies benign antibiotic therapy for any reason at this point. 03/30/2016 -- the patient has been doing fine and after debridement of his wound today I believe is ready for a skin substitute and he has been previously approved for grafix and we will try and obtain this from next week. 04/06/2016 -- the patient's insurance was run again and he had $275 copayment and he has declined the skin  substitute. Objective Constitutional Pulse regular. Respirations normal and unlabored. Afebrile. Vitals Time Taken: 1:05 PM, Height: 67 in, Weight: 147 lbs, BMI: 23, Temperature: 98.3 F, Pulse: 100 bpm, Respiratory Rate: 18 breaths/min, Blood Pressure: 128/83 mmHg. Eyes Nonicteric. Reactive to light. Ears, Nose, Mouth, and  Throat Rothlisberger, Jobany ([id number]) Lips, teeth, and gums WNL.Marland Kitchen Moist mucosa without lesions. Neck supple and nontender. No palpable supraclavicular or cervical adenopathy. Normal sized without goiter. Respiratory WNL. No retractions.. Breath sounds WNL, No rubs, rales, rhonchi, or wheeze.. Cardiovascular Heart rhythm and rate regular, no murmur or gallop.. Pedal Pulses WNL. No clubbing, cyanosis or edema. Lymphatic No adneopathy. No adenopathy. No adenopathy. Musculoskeletal Adexa without tenderness or enlargement.. Digits and nails w/o clubbing, cyanosis, infection, petechiae, ischemia, or inflammatory conditions.Marland Kitchen Psychiatric Judgement and insight Intact.. No evidence of depression, anxiety, or agitation.. General Notes: the wound had some surrounding eschar and callus and the base of the wound had some debris and this was all sharply removed with a #3 curet and bleeding controlled with pressure. Integumentary (Hair, Skin) No suspicious lesions. No crepitus or fluctuance. No peri-wound warmth or erythema. No masses.. Wound #1 status is Open. Original cause of wound was Pressure Injury. The wound is located on the Left Sacrum. The wound measures 0.9cm length x 1.4cm width x 0.2cm depth; 0.99cm^2 area and 0.198cm^3 volume. There is fat exposed. There is no tunneling or undermining noted. There is a large amount of serous drainage noted. The wound margin is thickened. There is medium (34-66%) pink granulation within the wound bed. There is a medium (34-66%) amount of necrotic tissue within the wound bed including Adherent Slough. The periwound skin appearance  exhibited: Callus, Maceration, Moist. The periwound skin appearance did not exhibit: Dry/Scaly. Periwound temperature was noted as No Abnormality. Assessment Active Problems ICD-10 L89.323 - Pressure ulcer of left buttock, stage 3 G82.22 - Paraplegia, incomplete Savin, Tameron ([id number]) In view of the fact that we are not can to be able to get a skin substitute to apply to his wound I have curetted the wound and we will now use Prisma AG every other day with offloading. All other supportive care including adequate protein, vitamins have been discussed with him and he is being compliant Procedures Wound #1 Wound #1 is a Pressure Ulcer located on the Left Sacrum . There was a Skin/Subcutaneous Tissue Debridement ([id number]) debridement with total area of 1.26 sq cm performed by Evlyn Kanner, MD. with the following instrument(s): Curette to remove Viable and Non-Viable tissue/material including Exudate, Fibrin/Slough, and Subcutaneous after achieving pain control using Lidocaine 4% Topical Solution. A time out was conducted at 11:17, prior to the start of the procedure. A Minimum amount of bleeding was controlled with Pressure. The procedure was tolerated well with a pain level of 0 throughout and a pain level of 0 following the procedure. Post Debridement Measurements: 0.9cm length x 1.4cm width x 0.2cm depth; 0.198cm^3 volume. Post debridement Stage noted as Category/Stage III. Character of Wound/Ulcer Post Debridement requires further debridement. Severity of Tissue Post Debridement is: Fat layer exposed. Post procedure Diagnosis Wound #1: Same as Pre-Procedure Plan Wound Cleansing: Wound #1 Left Sacrum: Clean wound with Normal Saline. Anesthetic: Wound #1 Left Sacrum: Topical Lidocaine 4% cream applied to wound bed prior to debridement Skin Barriers/Peri-Wound Care: Wound #1 Left Sacrum: Skin Prep Primary Wound Dressing: Wound #1 Left Sacrum: Prisma Ag - moisten with  saline Secondary Dressing: Wound #1 Left Sacrum: Foam Tegaderm Dressing Change Frequency: Wound #1 Left SacrumLLOYDE, LUDLAM ([id number]) Change dressing every other day. Follow-up Appointments: Wound #1 Left Sacrum: Return Appointment in 2 weeks. Off-Loading: Wound #1 Left Sacrum: Turn and reposition every 2 hours Additional Orders / Instructions: Wound #1 Left Sacrum: Increase protein intake. Medications-please add to medication list.: Wound #1 Left Sacrum:  Other: - Vitamin C, A and Zinc In view of the fact that we are not can to be able to get a skin substitute to apply to his wound I have curetted the wound and we will now use [employer] every other day with offloading. All other supportive care including adequate protein, vitamins have been discussed with him and he is being compliant Electronic Signature(s) Signed: 04/06/2016 1:51:14 PM By: Evlyn Kanner MD, FACS Entered By: Evlyn Kanner on 04/06/2016 13:51:14 Matthew Evangelist ([id number]) -------------------------------------------------------------------------------- SuperBill Details Patient Name: Matthew Evangelist Date of Service: 04/06/2016 Medical Record Number: [id number] Patient Account Number: [id number] Date of Birth/Sex: 1970-07-11 (45 y.o. Male) Treating RN: Matthew Haggis Primary Care Physician: PATIENT, NO Other Clinician: Referring Physician: Treating Physician/Extender: Rudene Re in Treatment: 33 Diagnosis Coding ICD-10 Codes Code Description [id number] Pressure ulcer of left buttock, stage 3 G82.22 Paraplegia, incomplete Facility Procedures CPT4 Code: [id number] Description: 11042 - DEB SUBQ TISSUE 20 SQ CM/< ICD-10 Description Diagnosis L89.323 Pressure ulcer of left buttock, stage 3 G82.22 Paraplegia, incomplete Modifier: Quantity: 1 Physician Procedures CPT4 Code: [id number] Description: 11042 - WC PHYS SUBQ TISS 20 SQ CM ICD-10 Description Diagnosis L89.323 Pressure ulcer of left buttock,  stage 3 G82.22 Paraplegia, incomplete Modifier: Quantity: 1 Electronic Signature(s) Signed: 04/06/2016 1:51:22 PM By: Evlyn Kanner MD, FACS Entered By: Evlyn Kanner on 04/06/2016 13:51:22

## 2016-04-12 ENCOUNTER — Encounter: Payer: Medicare Other | Admitting: Internal Medicine

## 2016-04-12 DIAGNOSIS — G8222 Paraplegia, incomplete: Secondary | ICD-10-CM | POA: Diagnosis not present

## 2016-04-12 DIAGNOSIS — Z8249 Family history of ischemic heart disease and other diseases of the circulatory system: Secondary | ICD-10-CM | POA: Diagnosis not present

## 2016-04-12 DIAGNOSIS — L89153 Pressure ulcer of sacral region, stage 3: Secondary | ICD-10-CM | POA: Diagnosis not present

## 2016-04-12 DIAGNOSIS — F17210 Nicotine dependence, cigarettes, uncomplicated: Secondary | ICD-10-CM | POA: Diagnosis not present

## 2016-04-12 DIAGNOSIS — L89323 Pressure ulcer of left buttock, stage 3: Secondary | ICD-10-CM | POA: Diagnosis not present

## 2016-04-13 ENCOUNTER — Ambulatory Visit: Payer: Self-pay | Admitting: Nurse Practitioner

## 2016-04-13 NOTE — Progress Notes (Signed)
[name], [name] ([id number]) Visit Report for 04/12/2016 Chief Complaint Document Details Patient Name: [name], [name] Date of Service: 04/12/2016 2:15 PM Medical Record Patient Account Number: [id number] Number: Treating RN: [name], Debi 17-Dec-1970 (45 y.o. Other Clinician: Date of Birth/Sex: Male) Treating [name] Primary Care Frank: PATIENT, NO Frank/Extender: [name]: [name]: [id number] Information Obtained from: Patient Chief Complaint [name] presents today, after missing last week's appointment, for follow-up on his left buttock stage III pressure ulcer. Electronic Signature(s) Signed: 04/12/2016 5:40:41 PM By: [name] Entered By: [name] on 04/12/2016 16:58:03 [name], [name] ([id number]) -------------------------------------------------------------------------------- Debridement Details Patient Name: [name], [name] Date of Service: 04/12/2016 2:15 PM Medical Record Patient Account Number: [id number] Number: Treating RN: [name], Debi 17-Dec-1970 (45 y.o. Other Clinician: Date of Birth/Sex: Male) Treating [name] Primary Care Frank: PATIENT, NO Frank/Extender: [name]: [name]: 34 Debridement Performed for Wound #1 Left Sacrum Assessment: Performed By: Frank [name] Debridement: Debridement Pre-procedure Yes - 14:53 Verification/Time Out Taken: Start Time: 14:54 Pain Control: Lidocaine 4% Topical Solution Level: Skin/Subcutaneous Tissue Total Area Debrided (L x 0.9 (cm) x 1.4 (cm) = 1.26 (cm) W): Tissue and other Viable, Non-Viable, Exudate, Fibrin/Slough, Subcutaneous material debrided: Instrument: Curette Bleeding: Minimum Hemostasis Achieved: Pressure End Time: 14:58 Procedural Pain: 0 Post Procedural Pain: 0 Response to Frank: Procedure was tolerated well Post Debridement Measurements of Total  Wound Length: (cm) 0.9 Stage: Category/Stage III Width: (cm) 1.4 Depth: (cm) 0.2 Volume: (cm) 0.198 Character of Wound/Ulcer Post Requires Further Debridement: Debridement Severity of Tissue Post Fat layer exposed Debridement: Post Procedure Diagnosis Same as Pre-procedure Electronic Signature(s) Signed: 04/12/2016 5:12:43 PM By: [name] ([id number]) Signed: 04/12/2016 5:40:41 PM By: Matthew Najjarobson, Nam Vossler Frank Entered By: Matthew Najjarobson, Tanikka Bresnan on 04/12/2016 16:57:36 [name], [name] ([id number]) -------------------------------------------------------------------------------- HPI Details Patient Name: [name], [name] Date of Service: 04/12/2016 2:15 PM Medical Record Patient Account Number: [id number] Number: Treating RN: [name], Debi 17-Dec-1970 (45 y.o. 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 He was diagnosed with what sounds like a stage III pressure ulcer in September 2016 although this may have been present for some time longer than that perhaps July 2016. [place]. By his description there were using Silvadene cream for a period of time and then 3 months ago change to Aquacel Ag which she is continued and changes daily with help from his mother. [place] [place] He went to urgent care to follow-up on his wound on 08/04/2015 they did a culture of this wound that showed Pseudomonas and MSSA. He was placed on an antibiotic but doesn't remember which one it is. He has continued using Aquacel Ag and a foam based cover. I don't have any of his records from the wound care center  in [place] although the patient states he did have an MRI that did not show osteomyelitis. He is not aware of having a bone culture done and doesn't think that he had exposed bone on this and any point. The patient does in and out cath's and has no urinary incontinence issues. He is on appropriate bowel regimen which she administers suppositories. He is able to position himself  appropriately and is wheelchair to try and avoid pressure over this wound 08/24/15; small horizontal wound over his lower sacral area. Requires debridement of surface slough and nonviable subcutaneous tissue. There is a small amount of undermining noted no evidence of infection. On the right side of this wound there is some senescent rolled edges which may need debridement I did not do this today. 09/01/15 this is a small wound over the lower sacral area. It has tunneling superiorly. This goes roughly 1 cm. Nonviable subcutaneous tissue at the inferior aspect of the wound base. No real evidence of infection 09/07/15; small horizontal wound over the lower sacral area. Last week it had 1 cm of superior tunneling which appears to of resolved. No evidence of infection 09/21/15; continues to have a small horizontal wound over the lower sacral area. We have been using Prisma and this has contracted. He tells me that his mother is aware medication not able to change the dressing although he states that he is able to do this 09/28/15; a small horizontal wound over his lower sacral area is all that is left of this wound which was probably much larger and deeper at one point. Depth today at 0.7 cm. Base of this had some nonviable subcutaneous tissue that I removed. Appears to be stalling on current dressing which is Prisma 10/19/15; I have not seen this patient in several Matthew. He arrives today with a linear/horizontal wound across his lower sacral area. Considerable depth of this wound. Extensive debridement done 11/17/15; patient  arrives for two-week visit. He is not doing well wound is bigger with odor and drainage. Has been using Aquacel Ag. I have not seen this in over a month although I think he was seen by the Frank covering me 2 Matthew ago on 11/05/15 11/24/15; I brought this man back this week to review the wound after last week's deterioration. Culture grew Enterobacter and some staph aureus [MSSA]. I had ordered him Cipro but we don't seem to of been able to communicate with the patient therefore we've called in today. He is not been systemically unwell. Still using VERE, DIANTONIO ([id number]) Aquacel 12/15/15; very little change in this wound since the last time I saw it. Horizontal wound over the lower sacral area probably about 2 cm in width and 0.8 cm in depth. There is no evidence of infection. There appears to be callus around this. In spite of the patient's best efforts I think there is an adequate pressure relief here. No current evidence of infection 12/22/15; horizontal pressure ulcer over the lower sacral area. Appears to be filling in and has better dimensions in appearance today. Still circumferential callus which would suggest inadequate offloading although everything else appears to be improved 12/29/15 horizontal pressure wound over the lower sacral area. Appears to be filling in however skin groin down into the divot. No evidence of infection 01/05/16; comes in today with not much change in this wound. There is surrounding callus indicative of pressure. Not much change in the wound bed. This clearly require debridement today we have been using Hydrofera Blue 02/04/16: returns today after an absence. no systemic s/s of infection. once again, periwound callus is present suggesting pressure. 02/18/2016 -- she was away for 2 Matthew traveling to [place] and during that time he had a new pressure injury to the left ischial tuberosity which he thinks may have been caused while transferring. 03-23-2016; Mr.  Baumgardner returns today, after missing last week's appointment, for evaluation of his left  buttock stage III pressure ulcer. There is no ulceration to the left ischial tuberosity at this appointment. He states that he has been changing the dressing to his ulcer daily due to daily showering. He voices no complaints or concerns and denies benign antibiotic therapy for any reason at this point. 03/30/2016 -- the patient has been doing fine and after debridement of his wound today I believe is ready for a skin substitute and he has been previously approved for grafix and we will try and obtain this from next week. 04/06/2016 -- the patient's insurance was run again and he had $275 copayment and he has declined the skin substitute. 12/20/17opatient arrived on the wrong day and as he drives from [place] we fit him in the scheduled for review Electronic Signature(s) Signed: 04/12/2016 5:40:41 PM By: Matthew Najjar Frank Entered By: Matthew Najjar on 04/12/2016 16:58:47 AGUSTUS, MANE ([id number]) -------------------------------------------------------------------------------- Physical Exam Details Patient Name: Matthew Evangelist Date of Service: 04/12/2016 2:15 PM Medical Record Patient Account Number: [id number] Number: Treating RN: Matthew Haggis May 28, 1970 (45 y.o. Other Clinician: Date of Birth/Sex: Male) Treating Tiari Andringa Primary Care Frank: PATIENT, NO Frank/Extender: [name]: [name]: 34 Constitutional Sitting or standing Blood Pressure is within target range for patient.. Pulse regular and within target range for patient.Marland Kitchen Respirations regular, non-labored and within target range.. Temperature is normal and within the target range for the patient.. Patient's appearance is neat and clean. Appears in no acute distress. Well nourished and well developed.. Notes Wound exam; much the same as last description. Surface slough thick senescent  surrounding circumference. Debrided with a #3 curet to remove the surface slough and reduce this surrounding nonviable edges. No evidence of surrounding infection. Hemostasis with direct pressure. He tolerated this well Electronic Signature(s) Signed: 04/12/2016 5:40:41 PM By: Matthew Najjar Frank Entered By: Matthew Najjar on 04/12/2016 17:00:07 Matthew Evangelist ([id number]) -------------------------------------------------------------------------------- Frank Orders Details Patient Name: Matthew Evangelist Date of Service: 04/12/2016 2:15 PM Medical Record Patient Account Number: [id number] Number: Treating RN: Matthew Haggis 14-Oct-1970 (45 y.o. Other Clinician: Date of Birth/Sex: Male) Treating Enyla Lisbon Primary Care Frank: PATIENT, NO Frank/Extender: [name]: [name]: 51 Verbal / Phone Orders: Yes Clinician: Pinkerton, Debi Read Back and Verified: Yes Diagnosis Coding Wound Cleansing Wound #1 Left Sacrum o Clean wound with Normal Saline. Anesthetic Wound #1 Left Sacrum o Topical Lidocaine 4% cream applied to wound bed prior to debridement Skin Barriers/Peri-Wound Care Wound #1 Left Sacrum o Skin Prep Primary Wound Dressing Wound #1 Left Sacrum o Prisma Ag - moisten with saline Secondary Dressing Wound #1 Left Sacrum o Foam o Tegaderm Dressing Change Frequency Wound #1 Left Sacrum o Change dressing every other day. Follow-up Appointments Wound #1 Left Sacrum o Return Appointment in 2 Matthew. Off-Loading Wound #1 Left Sacrum o Turn and reposition every 2 hours ROMUALDO, PROSISE ([id number]) Additional Orders / Instructions Wound #1 Left Sacrum o Increase protein intake. Medications-please add to medication list. Wound #1 Left Sacrum o Other: - Vitamin C, A and Zinc Electronic Signature(s) Signed: 04/12/2016 5:12:43 PM By: Alejandro Mulling Signed: 04/12/2016 5:40:41 PM By: Matthew Najjar  Frank Entered By: Alejandro Mulling on 04/12/2016 14:58:17 DALESSANDRO, BALDYGA ([id number]) -------------------------------------------------------------------------------- Problem List Details Patient Name: Matthew Evangelist Date of Service: 04/12/2016 2:15 PM Medical Record Patient Account Number: [id number] Number: Treating RN: Matthew Haggis 02-22-1971 (45 y.o. Other Clinician: Date of Birth/Sex: Male) Treating Daren Doswell Primary Care Frank: PATIENT, NO Frank/Extender: [name]: [name]: 65 Active  Problems ICD-10 Encounter Code Description Active Date Diagnosis L89.323 Pressure ulcer of left buttock, stage 3 03/23/2016 Yes G82.22 Paraplegia, incomplete 08/17/2015 Yes Inactive Problems Resolved Problems ICD-10 Code Description Active Date Resolved Date L89.322 Pressure ulcer of left buttock, stage 2 02/18/2016 02/18/2016 L89.153 Pressure ulcer of sacral region, stage 3 08/17/2015 08/17/2015 Electronic Signature(s) Signed: 04/12/2016 5:40:41 PM By: Matthew Najjar Frank Entered By: Matthew Najjar on 04/12/2016 16:57:14 Matthew Evangelist ([id number]) -------------------------------------------------------------------------------- Progress Note Details Patient Name: Matthew Evangelist Date of Service: 04/12/2016 2:15 PM Medical Record Patient Account Number: [id number] Number: Treating RN: Matthew Haggis 1970/10/10 (45 y.o. Other Clinician: Date of Birth/Sex: Male) Treating Rainna Nearhood Primary Care Frank: PATIENT, NO Frank/Extender: [name]: [name]: 53 Subjective Chief Complaint Information obtained from Patient Mr. Burbach presents today, after missing last week's appointment, for follow-up on his left buttock stage III pressure ulcer. 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 He was diagnosed with what sounds like a stage III pressure ulcer in September 2016 although this may have been present for some time longer than that perhaps July 2016. He was followed by wound care in [place]. By his description there were using Silvadene cream for a period of time and then 3 months ago change to Aquacel Ag which she is continued and changes daily with help from his mother. He has recently relocated to [facility]. He went to urgent care to follow- up on his wound on 08/04/2015 they did a culture of this wound that showed Pseudomonas and MSSA. He was placed on an antibiotic but doesn't remember which one it is. He has continued using Aquacel Ag and a foam based cover. I don't have any of his records from the wound care center in [place] although the patient states he did have an MRI that did not show osteomyelitis. He is not aware of having a bone culture done and doesn't think that he had exposed bone on this and any point. The patient does in and out cath's and has no urinary incontinence issues. He is on appropriate bowel regimen which she administers suppositories. He is able to position himself appropriately and is wheelchair to try and avoid pressure over this wound 08/24/15; small horizontal wound over his lower sacral area. Requires debridement of surface slough and nonviable subcutaneous tissue. There is a small amount of undermining noted no evidence of infection. On the right side of this wound there is some senescent rolled edges which may need debridement I did not do this today. 09/01/15 this is a small wound over the lower sacral area. It has tunneling superiorly. This goes roughly 1 cm. Nonviable subcutaneous tissue at the inferior aspect of the wound base. No real evidence of infection 09/07/15; small horizontal wound over the lower sacral area. Last week it had 1 cm of superior tunneling which appears to of resolved. No evidence of  infection 09/21/15; continues to have a small horizontal wound over the lower sacral area. We have been using Prisma and this has contracted. He tells me that his mother is aware medication not able to change the dressing although he states that he is able to do this 09/28/15; a small horizontal wound over his lower sacral area is all that is left of this wound which was probably much larger and deeper at one point. Depth today at 0.7 cm. Base  of this had some nonviable subcutaneous tissue that I removed. Appears to be stalling on current dressing which is Prisma 10/19/15; I have not seen this patient in several Matthew. He arrives today with a linear/horizontal wound CRISTON, CHANCELLOR ([id number]) across his lower sacral area. Considerable depth of this wound. Extensive debridement done 11/17/15; patient arrives for two-week visit. He is not doing well wound is bigger with odor and drainage. Has been using Aquacel Ag. I have not seen this in over a month although I think he was seen by the Frank covering me 2 Matthew ago on 11/05/15 11/24/15; I brought this man back this week to review the wound after last week's deterioration. Culture grew Enterobacter and some staph aureus [MSSA]. I had ordered him Cipro but we don't seem to of been able to communicate with the patient therefore we've called in today. He is not been systemically unwell. Still using Aquacel 12/15/15; very little change in this wound since the last time I saw it. Horizontal wound over the lower sacral area probably about 2 cm in width and 0.8 cm in depth. There is no evidence of infection. There appears to be callus around this. In spite of the patient's best efforts I think there is an adequate pressure relief here. No current evidence of infection 12/22/15; horizontal pressure ulcer over the lower sacral area. Appears to be filling in and has better dimensions in appearance today. Still circumferential callus which would suggest  inadequate offloading although everything else appears to be improved 12/29/15 horizontal pressure wound over the lower sacral area. Appears to be filling in however skin groin down into the divot. No evidence of infection 01/05/16; comes in today with not much change in this wound. There is surrounding callus indicative of pressure. Not much change in the wound bed. This clearly require debridement today we have been using Hydrofera Blue 02/04/16: returns today after an absence. no systemic s/s of infection. once again, periwound callus is present suggesting pressure. 02/18/2016 -- she was away for 2 Matthew traveling to [place] and during that time he had a new pressure injury to the left ischial tuberosity which he thinks may have been caused while transferring. 03-23-2016; Mr. Petrasek returns today, after missing last week's appointment, for evaluation of his left buttock stage III pressure ulcer. There is no ulceration to the left ischial tuberosity at this appointment. He states that he has been changing the dressing to his ulcer daily due to daily showering. He voices no complaints or concerns and denies benign antibiotic therapy for any reason at this point. 03/30/2016 -- the patient has been doing fine and after debridement of his wound today I believe is ready for a skin substitute and he has been previously approved for grafix and we will try and obtain this from next week. 04/06/2016 -- the patient's insurance was run again and he had $275 copayment and he has declined the skin substitute. 04/12/16 patient arrived on the wrong day and as he drives from [place] we fit him in the scheduled for review Objective Constitutional Sitting or standing Blood Pressure is within target range for patient.. Pulse regular and within target range for patient.Marland Kitchen Respirations regular, non-labored and within target range.. Temperature is normal and within the target range for the patient.. Patient's  appearance is neat and clean. Appears in no acute distress. Well nourished and well developed.. Vitals Time Taken: 2:36 PM, Height: 67 in, Weight: 147 lbs, BMI: 23, Temperature: 97.5 F, Pulse: 73 bpm, Greulich, Mckinnley (  [id number]) Respiratory Rate: 18 breaths/min, Blood Pressure: 119/70 mmHg. General Notes: Wound exam; much the same as last description. Surface slough thick senescent surrounding circumference. Debrided with a #3 curet to remove the surface slough and reduce this surrounding nonviable edges. No evidence of surrounding infection. Hemostasis with direct pressure. He tolerated this well Integumentary (Hair, Skin) Wound #1 status is Open. Original cause of wound was Pressure Injury. The wound is located on the Left Sacrum. The wound measures 0.9cm length x 1.4cm width x 0.2cm depth; 0.99cm^2 area and 0.198cm^3 volume. There is fat exposed. There is no tunneling or undermining noted. There is a large amount of serous drainage noted. The wound margin is thickened. There is medium (34-66%) pink granulation within the wound bed. There is a medium (34-66%) amount of necrotic tissue within the wound bed including Adherent Slough. The periwound skin appearance exhibited: Callus, Maceration, Moist. The periwound skin appearance did not exhibit: Dry/Scaly. Periwound temperature was noted as No Abnormality. Assessment Active Problems ICD-10 L89.323 - Pressure ulcer of left buttock, stage 3 G82.22 - Paraplegia, incomplete Procedures Wound #1 Wound #1 is a Pressure Ulcer located on the Left Sacrum . There was a Skin/Subcutaneous Tissue Debridement ([id number]) debridement with total area of 1.26 sq cm performed by Matthew Caul, Frank. with the following instrument(s): Curette to remove Viable and Non-Viable tissue/material including Exudate, Fibrin/Slough, and Subcutaneous after achieving pain control using Lidocaine 4% Topical Solution. A time out was conducted at 14:53, prior to the  start of the procedure. A Minimum amount of bleeding was controlled with Pressure. The procedure was tolerated well with a pain level of 0 throughout and a pain level of 0 following the procedure. Post Debridement Measurements: 0.9cm length x 1.4cm width x 0.2cm depth; 0.198cm^3 volume. Post debridement Stage noted as Category/Stage III. Character of Wound/Ulcer Post Debridement requires further debridement. Severity of Tissue Post Debridement is: Fat layer exposed. Post procedure Diagnosis Wound #1: Same as Pre-Procedure NOLTON, DENIS ([id number]) Plan Wound Cleansing: Wound #1 Left Sacrum: Clean wound with Normal Saline. Anesthetic: Wound #1 Left Sacrum: Topical Lidocaine 4% cream applied to wound bed prior to debridement Skin Barriers/Peri-Wound Care: Wound #1 Left Sacrum: Skin Prep Primary Wound Dressing: Wound #1 Left Sacrum: Prisma Ag - moisten with saline Secondary Dressing: Wound #1 Left Sacrum: Foam Tegaderm Dressing Change Frequency: Wound #1 Left Sacrum: Change dressing every other day. Follow-up Appointments: Wound #1 Left Sacrum: Return Appointment in 2 Matthew. Off-Loading: Wound #1 Left Sacrum: Turn and reposition every 2 hours Additional Orders / Instructions: Wound #1 Left Sacrum: Increase protein intake. Medications-please add to medication list.: Wound #1 Left Sacrum: Other: - Vitamin C, A and Zinc Continue with Prisma based dressings,border foam Electronic Signature(s) Signed: 04/12/2016 5:40:41 PM By: Matthew Najjar Frank Entered By: Matthew Najjar on 04/12/2016 17:07:14 Matthew Evangelist ([id number]) STANFORD, STRAUCH ([id number]) -------------------------------------------------------------------------------- SuperBill Details Patient Name: Matthew Evangelist Date of Service: 04/12/2016 Medical Record Patient Account Number: [id number] Number: Treating RN: Matthew Haggis July 20, 1970 (45 y.o. Other Clinician: Date of Birth/Sex: Male) Treating  Lyfe Monger Primary Care Frank: PATIENT, NO Frank/Extender: [name]: [name]: 34 Diagnosis Coding ICD-10 Codes Code Description L89.323 Pressure ulcer of left buttock, stage 3 G82.22 Paraplegia, incomplete Facility Procedures CPT4 Code: [id number] Description: 11042 - DEB SUBQ TISSUE 20 SQ CM/< ICD-10 Description Diagnosis L89.323 Pressure ulcer of left buttock, stage 3 Modifier: Quantity: 1 Frank Procedures CPT4 Code: [id number] Description: 11042 - WC PHYS SUBQ TISS 20 SQ CM ICD-10 Description Diagnosis L89.323 Pressure  ulcer of left buttock, stage 3 Modifier: Quantity: 1 Electronic Signature(s) Signed: 04/12/2016 5:40:41 PM By: Matthew Najjarobson, Richie Bonanno Frank Entered By: Matthew Najjarobson, Sarayah Bacchi on 04/12/2016 17:07:33

## 2016-04-13 NOTE — Progress Notes (Signed)
[name] Frank, [name] Frank ([id number]) Visit Report for 04/12/2016 Arrival Information Details Patient Name: [name] Frank, [name] Date of Service: 04/12/2016 2:15 PM Medical Record Number: [id number] Patient Account Number: [id number] Date of Birth/Sex: [birth year] [age] y.o. Male) Treating RN: Phillis HaggisPinkerton, Debi Primary Care Physician: PATIENT, NO Other Clinician: Referring Physician: Treating Physician/Extender: Altamese CarolinaOBSON, MICHAEL G Weeks in Treatment: 34 Visit Information History Since Last Visit All ordered tests and consults were completed: No Patient Arrived: Wheel Chair Added or deleted any medications: No Arrival Time: 14:35 Any new allergies or adverse reactions: No Accompanied By: self Had a fall or experienced change in No activities of daily living that may affect Transfer Assistance: Other risk of falls: Patient Identification Verified: Yes Signs or symptoms of abuse/neglect since last No Secondary Verification Process Yes visito Completed: Hospitalized since last visit: No Patient Requires Transmission-Based No Has Dressing in Place as Prescribed: Yes Precautions: Pain Present Now: No Patient Has Alerts: No Electronic Signature(s) Signed: 04/12/2016 5:12:43 PM By: Alejandro MullingPinkerton, Debra Entered By: Alejandro MullingPinkerton, Debra on 04/12/2016 14:36:07 [name] Frank, [name] ([id number]) -------------------------------------------------------------------------------- Encounter Discharge Information Details Patient Name: [name] Frank, [name] Date of Service: 04/12/2016 2:15 PM Medical Record Number: [id number] Patient Account Number: [id number] Date of Birth/Sex: [birth year] [age] y.o. Male) Treating RN: Phillis HaggisPinkerton, Debi Primary Care Physician: PATIENT, NO Other Clinician: Referring Physician: Treating Physician/Extender: Altamese CarolinaOBSON, MICHAEL G Weeks in Treatment: 34 Encounter Discharge Information Items Discharge Pain Level: 0 Discharge Condition: Stable Ambulatory Status: Wheelchair Discharge Destination:  Home Transportation: Private Auto Accompanied By: self Schedule Follow-up Appointment: Yes Medication Reconciliation completed and provided to Patient/Care Yes Aneth Schlagel: Provided on Clinical Summary of Care: 04/12/2016 Form Type Recipient Paper [facility] Electronic Signature(s) Signed: 04/12/2016 3:08:50 PM By: Gwenlyn PerkingMoore, Shelia Entered By: Gwenlyn PerkingMoore, Shelia on 04/12/2016 15:08:50 [name] Frank, Shubham ([id number]) -------------------------------------------------------------------------------- Lower Extremity Assessment Details Patient Name: [name] Frank, [name] Date of Service: 04/12/2016 2:15 PM Medical Record Number: [id number] Patient Account Number: [id number] Date of Birth/Sex: [birth year] [age] y.o. Male) Treating RN: Phillis HaggisPinkerton, Debi Primary Care Physician: PATIENT, NO Other Clinician: Referring Physician: Treating Physician/Extender: Maxwell CaulOBSON, MICHAEL G Weeks in Treatment: 34 Electronic Signature(s) Signed: 04/12/2016 5:12:43 PM By: Alejandro MullingPinkerton, Debra Entered By: Alejandro MullingPinkerton, Debra on 04/12/2016 14:38:24 [name] Frank, [name] ([id number]) -------------------------------------------------------------------------------- Multi Wound Chart Details Patient Name: [name] Frank, [name] Date of Service: 04/12/2016 2:15 PM Medical Record Number: [id number] Patient Account Number: [id number] Date of Birth/Sex: [birth year] [age] y.o. Male) Treating RN: Phillis HaggisPinkerton, Debi Primary Care Physician: PATIENT, NO Other Clinician: Referring Physician: Treating Physician/Extender: Maxwell CaulOBSON, MICHAEL G Weeks in Treatment: 34 Vital Signs Height(in): 67 Pulse(bpm): 73 Weight(lbs): 147 Blood Pressure 119/70 (mmHg): Body Mass Index(BMI): 23 Temperature(F): 97.5 Respiratory Rate 18 (breaths/min): Photos: [N/A:N/A] Wound Location: Left Sacrum N/A N/A Wounding Event: Pressure Injury N/A N/A Primary Etiology: Pressure Ulcer N/A N/A Comorbid History: Paraplegia N/A N/A Date Acquired: 12/24/2014 N/A N/A Weeks of Treatment: 34 N/A  N/A Wound Status: Open N/A N/A Measurements L x W x D 0.9x1.4x0.2 N/A N/A (cm) Area (cm) : 0.99 N/A N/A Volume (cm) : 0.198 N/A N/A % Reduction in Area: 6.60% N/A N/A % Reduction in Volume: 62.60% N/A N/A Classification: Category/Stage III N/A N/A Exudate Amount: Large N/A N/A Exudate Type: Serous N/A N/A Exudate Color: amber N/A N/A Wound Margin: Thickened N/A N/A Granulation Amount: Medium (34-66%) N/A N/A Granulation Quality: Pink N/A N/A Necrotic Amount: Medium (34-66%) N/A N/A Exposed Structures: N/A N/A [name] Frank, [name] ([id number]) Fat: Yes Fascia: No Tendon: No Muscle: No Joint: No Bone: No Epithelialization: None N/A N/A Debridement: Debridement ([id number](11042- N/A N/A 11047) Pre-procedure 14:53 N/A N/A Verification/Time Out Taken: Pain  Control: Lidocaine 4% Topical N/A N/A Solution Tissue Debrided: Fibrin/Slough, Exudates, N/A N/A Subcutaneous Level: Skin/Subcutaneous N/A N/A Tissue Debridement Area (sq 1.26 N/A N/A cm): Instrument: Curette N/A N/A Bleeding: Minimum N/A N/A Hemostasis Achieved: Pressure N/A N/A Procedural Pain: 0 N/A N/A Post Procedural Pain: 0 N/A N/A Debridement Treatment Procedure was tolerated N/A N/A Response: well Post Debridement 0.9x1.4x0.2 N/A N/A Measurements L x W x D (cm) Post Debridement 0.198 N/A N/A Volume: (cm) Post Debridement Category/Stage III N/A N/A Stage: Periwound Skin Texture: Callus: Yes N/A N/A Periwound Skin Maceration: Yes N/A N/A Moisture: Moist: Yes Dry/Scaly: No Periwound Skin Color: No Abnormalities Noted N/A N/A Temperature: No Abnormality N/A N/A Tenderness on No N/A N/A Palpation: Wound Preparation: Ulcer Cleansing: N/A N/A Rinsed/Irrigated with Saline Topical Anesthetic Applied: Other: lidocaine 4% Procedures Performed: Debridement N/A N/A RENNE, PLATTS ([id number]) Treatment Notes Wound #1 (Left Sacrum) 1. Cleansed with: Clean wound with Normal Saline 2. Anesthetic Topical Lidocaine  4% cream to wound bed prior to debridement 3. Peri-wound Care: Skin Prep 4. Dressing Applied: Prisma Ag 5. Secondary Dressing Applied Foam [place] Electronic Signature(s) Signed: 04/12/2016 5:40:41 PM By: Baltazar Najjar MD Entered By: Baltazar Najjar on 04/12/2016 16:57:22 [name] Evangelist ([id number]) -------------------------------------------------------------------------------- Multi-Disciplinary Care Plan Details Patient Name: [name] Evangelist Date of Service: 04/12/2016 2:15 PM Medical Record Number: [id number] Patient Account Number: [id number] Date of Birth/Sex: November 26, 1970 (45 y.o. Male) Treating RN: Phillis Haggis Primary Care Physician: PATIENT, NO Other Clinician: Referring Physician: Treating Physician/Extender: Altamese [place] in Treatment: 34 Active Inactive Abuse / Safety / Falls / Self Care Management Nursing Diagnoses: Potential for falls Goals: Patient will remain injury free Date Initiated: 08/17/2015 Goal Status: Active Interventions: Assess fall risk on admission and as needed Notes: Nutrition Nursing Diagnoses: Imbalanced nutrition Goals: Patient/caregiver agrees to and verbalizes understanding of need to use nutritional supplements and/or vitamins as prescribed Date Initiated: 08/17/2015 Goal Status: Active Interventions: Assess patient nutrition upon admission and as needed per policy Notes: Orientation to the Wound Care Program Nursing Diagnoses: Knowledge deficit related to the wound healing center program Goals: Patient/caregiver will verbalize understanding of the Wound Healing Center Program SUMNER, KIRCHMAN ([id number]) Date Initiated: 08/17/2015 Goal Status: Active Interventions: Provide education on orientation to the wound center Notes: Pressure Nursing Diagnoses: Knowledge deficit related to causes and risk factors for pressure ulcer development Goals: Patient will remain free from development of additional pressure  ulcers Date Initiated: 08/17/2015 Goal Status: Active Interventions: Assess: immobility, friction, shearing, incontinence upon admission and as needed Notes: Wound/Skin Impairment Nursing Diagnoses: Impaired tissue integrity Goals: Ulcer/skin breakdown will have a volume reduction of 30% by week 4 Date Initiated: 08/17/2015 Goal Status: Active Ulcer/skin breakdown will have a volume reduction of 50% by week 8 Date Initiated: 08/17/2015 Goal Status: Active Ulcer/skin breakdown will have a volume reduction of 80% by week 12 Date Initiated: 08/17/2015 Goal Status: Active Interventions: Assess patient/caregiver ability to obtain necessary supplies Assess ulceration(s) every visit Notes: Electronic Signature(s) Signed: 04/12/2016 5:12:43 PM By: Servando Snare, Leonette Most ([id number]) Entered By: Alejandro Mulling on 04/12/2016 14:54:34 [name] Evangelist ([id number]) -------------------------------------------------------------------------------- Pain Assessment Details Patient Name: [name] Evangelist Date of Service: 04/12/2016 2:15 PM Medical Record Number: [id number] Patient Account Number: [id number] Date of Birth/Sex: 1971/01/31 (45 y.o. Male) Treating RN: Phillis Haggis Primary Care Physician: PATIENT, NO Other Clinician: Referring Physician: Treating Physician/Extender: Altamese [place] in Treatment: 34 Active Problems Location of Pain Severity and Description of Pain Patient Has Paino No Site Locations With Dressing Change: No Pain  Management and Medication Current Pain Management: Electronic Signature(s) Signed: 04/12/2016 5:12:43 PM By: Alejandro MullingPinkerton, Debra Entered By: Alejandro MullingPinkerton, Debra on 04/12/2016 14:36:14 [name] Frank, Sha ([id number]) -------------------------------------------------------------------------------- Patient/Caregiver Education Details Patient Name: [name] Frank, Reyes Date of Service: 04/12/2016 2:15 PM Medical Record Number: [id number] Patient  Account Number: [id number] Date of Birth/Gender: [birth year] [age] y.o. Male) Treating RN: Phillis HaggisPinkerton, Debi Primary Care Physician: PATIENT, NO Other Clinician: Referring Physician: Treating Physician/Extender: Altamese CarolinaOBSON, MICHAEL G Weeks in Treatment: 3934 Education Assessment Education Provided To: Patient Education Topics Provided Wound/Skin Impairment: Handouts: Other: change dressing as ordered Methods: Demonstration, Explain/Verbal Responses: State content correctly Electronic Signature(s) Signed: 04/12/2016 5:12:43 PM By: Alejandro MullingPinkerton, Debra Entered By: Alejandro MullingPinkerton, Debra on 04/12/2016 14:45:25 [place]berry, Leonette MostHARLES ([id number]) -------------------------------------------------------------------------------- Wound Assessment Details Patient Name: [name] Frank, Harjas Date of Service: 04/12/2016 2:15 PM Medical Record Number: [id number] Patient Account Number: [id number] Date of Birth/Sex: [birth year] [age] y.o. Male) Treating RN: Phillis HaggisPinkerton, Debi Primary Care Physician: PATIENT, NO Other Clinician: Referring Physician: Treating Physician/Extender: Maxwell CaulOBSON, MICHAEL G Weeks in Treatment: 34 Wound Status Wound Number: 1 Primary Etiology: Pressure Ulcer Wound Location: Left Sacrum Wound Status: Open Wounding Event: Pressure Injury Comorbid History: Paraplegia Date Acquired: 12/24/2014 Weeks Of Treatment: 34 Clustered Wound: No Photos Photo Uploaded By: Alejandro MullingPinkerton, Debra on 04/12/2016 14:46:36 Wound Measurements Length: (cm) 0.9 Width: (cm) 1.4 Depth: (cm) 0.2 Area: (cm) 0.99 Volume: (cm) 0.198 % Reduction in Area: 6.6% % Reduction in Volume: 62.6% Epithelialization: None Tunneling: No Undermining: No Wound Description Classification: Category/Stage III Wound Margin: Thickened Exudate Amount: Large Exudate Type: Serous Exudate Color: amber Foul Odor After Cleansing: No Wound Bed Granulation Amount: Medium (34-66%) Exposed Structure Granulation Quality: Pink Fascia Exposed:  No Necrotic Amount: Medium (34-66%) Fat Layer Exposed: Yes Necrotic Quality: Adherent Slough Tendon Exposed: No [name] Frank, Essie ([id number]) Muscle Exposed: No Joint Exposed: No Bone Exposed: No Periwound Skin Texture Texture Color No Abnormalities Noted: No No Abnormalities Noted: No Callus: Yes Temperature / Pain Moisture Temperature: No Abnormality No Abnormalities Noted: No Dry / Scaly: No Maceration: Yes Moist: Yes Wound Preparation Ulcer Cleansing: Rinsed/Irrigated with Saline Topical Anesthetic Applied: Other: lidocaine 4%, Treatment Notes Wound #1 (Left Sacrum) 1. Cleansed with: Clean wound with Normal Saline 2. Anesthetic Topical Lidocaine 4% cream to wound bed prior to debridement 3. Peri-wound Care: Skin Prep 4. Dressing Applied: Prisma Ag 5. Secondary Dressing Applied Foam [place] Electronic Signature(s) Signed: 04/12/2016 5:12:43 PM By: Alejandro MullingPinkerton, Debra Entered By: Alejandro MullingPinkerton, Debra on 04/12/2016 14:43:24 [name] Frank, Breeze ([id number]) -------------------------------------------------------------------------------- Vitals Details Patient Name: [name] Frank, Dyllen Date of Service: 04/12/2016 2:15 PM Medical Record Number: [id number] Patient Account Number: [id number] Date of Birth/Sex: [birth year] [age] y.o. Male) Treating RN: Phillis HaggisPinkerton, Debi Primary Care Physician: PATIENT, NO Other Clinician: Referring Physician: Treating Physician/Extender: Altamese CarolinaOBSON, MICHAEL G Weeks in Treatment: 34 Vital Signs Time Taken: 14:36 Temperature (F): 97.5 Height (in): 67 Pulse (bpm): 73 Weight (lbs): 147 Respiratory Rate (breaths/min): 18 Body Mass Index (BMI): 23 Blood Pressure (mmHg): 119/70 Reference Range: 80 - 120 mg / dl Electronic Signature(s) Signed: 04/12/2016 5:12:43 PM By: Alejandro MullingPinkerton, Debra Entered By: Alejandro MullingPinkerton, Debra on 04/12/2016 14:36:36

## 2016-04-14 ENCOUNTER — Ambulatory Visit: Payer: Self-pay | Admitting: Psychology

## 2016-04-20 ENCOUNTER — Encounter: Payer: Medicare Other | Admitting: Surgery

## 2016-04-20 DIAGNOSIS — L89323 Pressure ulcer of left buttock, stage 3: Secondary | ICD-10-CM | POA: Diagnosis not present

## 2016-04-20 DIAGNOSIS — Z8249 Family history of ischemic heart disease and other diseases of the circulatory system: Secondary | ICD-10-CM | POA: Diagnosis not present

## 2016-04-20 DIAGNOSIS — F17210 Nicotine dependence, cigarettes, uncomplicated: Secondary | ICD-10-CM | POA: Diagnosis not present

## 2016-04-20 DIAGNOSIS — L89153 Pressure ulcer of sacral region, stage 3: Secondary | ICD-10-CM | POA: Diagnosis not present

## 2016-04-20 DIAGNOSIS — G8222 Paraplegia, incomplete: Secondary | ICD-10-CM | POA: Diagnosis not present

## 2016-04-21 ENCOUNTER — Ambulatory Visit (INDEPENDENT_AMBULATORY_CARE_PROVIDER_SITE_OTHER): Payer: Medicare Other | Admitting: Psychology

## 2016-04-21 DIAGNOSIS — F3132 Bipolar disorder, current episode depressed, moderate: Secondary | ICD-10-CM | POA: Diagnosis not present

## 2016-04-21 NOTE — Progress Notes (Signed)
[name], Baruc ([id number]) Visit Report for 04/20/2016 Arrival Information Details Patient Name: [name], [name] Date of Service: 04/20/2016 1:15 PM Medical Record Number: [id number] Patient Account Number: [id number] Date of Birth/Sex: 1970/08/25 [age] y.o. Male) Treating RN: Curtis Sitesorthy, Joanna Primary Care Physician: PATIENT, NO Other Clinician: Referring Physician: Treating Physician/Extender: Rudene ReBritto, Errol Weeks in Treatment: 35 Visit Information History Since Last Visit Added or deleted any medications: No Patient Arrived: Wheel Chair Any new allergies or adverse reactions: No Arrival Time: 14:01 Had a fall or experienced change in No activities of daily living that may affect Accompanied By: self risk of falls: Transfer Assistance: None Signs or symptoms of abuse/neglect since last No Patient Identification Verified: Yes visito Secondary Verification Process Yes Hospitalized since last visit: No Completed: Pain Present Now: No Patient Requires Transmission-Based No Precautions: Patient Has Alerts: No Electronic Signature(s) Signed: 04/20/2016 5:15:35 PM By: Curtis Sitesorthy, Joanna Entered By: Curtis Sitesorthy, Joanna on 04/20/2016 14:01:32 [name], [name] ([id number]) -------------------------------------------------------------------------------- Encounter Discharge Information Details Patient Name: [name], [name] Date of Service: 04/20/2016 1:15 PM Medical Record Number: [id number] Patient Account Number: [id number] Date of Birth/Sex: 1970/08/25 [age] y.o. Male) Treating RN: Curtis Sitesorthy, Joanna Primary Care Physician: PATIENT, NO Other Clinician: Referring Physician: Treating Physician/Extender: Rudene ReBritto, Errol Weeks in Treatment: 6435 Encounter Discharge Information Items Discharge Pain Level: 0 Discharge Condition: Stable Ambulatory Status: Wheelchair Discharge Destination: Home Transportation: Private Auto Accompanied By: self Schedule Follow-up Appointment: Yes Medication Reconciliation  completed and provided to Patient/Care No [facility]: Provided on Clinical Summary of Care: 04/20/2016 Form Type [facility]ipient Paper [facility] Signature(s) Signed: 04/20/2016 4:27:17 PM By: Curtis Sitesorthy, Joanna Previous Signature: 04/20/2016 2:31:57 PM Version By: Gwenlyn PerkingMoore, Shelia Entered By: Curtis Sitesorthy, Joanna on 04/20/2016 16:27:17 [name], [name] ([id number]) -------------------------------------------------------------------------------- Multi Wound Chart Details Patient Name: [name], [name] Date of Service: 04/20/2016 1:15 PM Medical Record Number: [id number] Patient Account Number: [id number] Date of Birth/Sex: 1970/08/25 [age] y.o. Male) Treating RN: Curtis Sitesorthy, Joanna Primary Care Physician: PATIENT, NO Other Clinician: Referring Physician: Treating Physician/Extender: Rudene ReBritto, Errol Weeks in Treatment: 35 Vital Signs Height(in): 67 Pulse(bpm): 90 Weight(lbs): 147 Blood Pressure 122/76 (mmHg): Body Mass Index(BMI): 23 Temperature(F): 98.1 Respiratory Rate 18 (breaths/min): Photos: [1:No Photos] [N/A:N/A] Wound Location: [1:Left Sacrum] [N/A:N/A] Wounding Event: [1:Pressure Injury] [N/A:N/A] Primary Etiology: [1:Pressure Ulcer] [N/A:N/A] Comorbid History: [1:Paraplegia] [N/A:N/A] Date Acquired: [1:12/24/2014] [N/A:N/A] Weeks of Treatment: [1:35] [N/A:N/A] Wound Status: [1:Open] [N/A:N/A] Measurements L x W x D 0.7x1.2x0.2 [N/A:N/A] (cm) Area (cm) : [1:0.66] [N/A:N/A] Volume (cm) : [1:0.132] [N/A:N/A] % Reduction in Area: [1:37.70%] [N/A:N/A] % Reduction in Volume: 75.10% [N/A:N/A] Classification: [1:Category/Stage III] [N/A:N/A] Exudate Amount: [1:Large] [N/A:N/A] Exudate Type: [1:Serous] [N/A:N/A] Exudate Color: [1:amber] [N/A:N/A] Wound Margin: [1:Thickened] [N/A:N/A] Granulation Amount: [1:Large (67-100%)] [N/A:N/A] Granulation Quality: [1:Pink] [N/A:N/A] Necrotic Amount: [1:Small (1-33%)] [N/A:N/A] Exposed Structures: [1:Fat: Yes Fascia: No Tendon: No Muscle:  No Joint: No Bone: No] [N/A:N/A] Epithelialization: [1:None] [N/A:N/A] Debridement: [N/A:N/A] Debridement ([id number] [phone]) Pre-procedure 14:14 N/A N/A Verification/Time Out Taken: Pain Control: Lidocaine 4% Topical N/A N/A Solution Tissue Debrided: Fibrin/Slough, Callus, N/A N/A Subcutaneous Level: Skin/Subcutaneous N/A N/A Tissue Debridement Area (sq 0.84 N/A N/A cm): Instrument: Curette N/A N/A Bleeding: Minimum N/A N/A Hemostasis Achieved: Pressure N/A N/A Procedural Pain: 0 N/A N/A Post Procedural Pain: 0 N/A N/A Debridement Treatment Procedure was tolerated N/A N/A Response: well Post Debridement 0.7x1.2x0.2 N/A N/A Measurements L x W x D (cm) Post Debridement 0.132 N/A N/A Volume: (cm) Post Debridement Category/Stage III N/A N/A Stage: Periwound Skin Texture: Callus: Yes N/A N/A Periwound Skin Maceration: Yes N/A N/A Moisture: Moist: Yes Dry/Scaly: No Periwound  Skin Color: No Abnormalities Noted N/A N/A Temperature: No Abnormality N/A N/A Tenderness on No N/A N/A Palpation: Wound Preparation: Ulcer Cleansing: N/A N/A Rinsed/Irrigated with Saline Topical Anesthetic Applied: Other: lidocaine 4% Procedures Performed: Debridement N/A N/A Treatment Notes Electronic Signature(s) Signed: 04/20/2016 2:20:49 PM By: Evlyn KannerBritto, Errol MD, FACS [place] Beth[place] ([id number]) Entered By: Evlyn KannerBritto, Errol on 04/20/2016 14:20:49 [name], [name] ([id number]) -------------------------------------------------------------------------------- Multi-Disciplinary Care Plan Details Patient Name: [name], [name] Date of Service: 04/20/2016 1:15 PM Medical Record Number: [id number] Patient Account Number: [id number] Date of Birth/Sex: 22-Feb-1971 [age] y.o. Male) Treating RN: Curtis Sitesorthy, Joanna Primary Care Physician: PATIENT, NO Other Clinician: Referring Physician: Treating Physician/Extender: Rudene ReBritto, Errol Weeks in Treatment: 2735 Active Inactive Abuse / Safety / Falls / Self Care  Management Nursing Diagnoses: Potential for falls Goals: Patient will remain injury free Date Initiated: 08/17/2015 Goal Status: Active Interventions: Assess fall risk on admission and as needed Notes: Nutrition Nursing Diagnoses: Imbalanced nutrition Goals: Patient/caregiver agrees to and verbalizes understanding of need to use nutritional supplements and/or vitamins as prescribed Date Initiated: 08/17/2015 Goal Status: Active Interventions: Assess patient nutrition upon admission and as needed per policy Notes: Orientation to the Wound Care Program Nursing Diagnoses: Knowledge deficit related to the wound healing center program Goals: Patient/caregiver will verbalize understanding of the Wound Healing Center Program [name], Sajid ([id number]) Date Initiated: 08/17/2015 Goal Status: Active Interventions: Provide education on orientation to the wound center Notes: Pressure Nursing Diagnoses: Knowledge deficit related to causes and risk factors for pressure ulcer development Goals: Patient will remain free from development of additional pressure ulcers Date Initiated: 08/17/2015 Goal Status: Active Interventions: Assess: immobility, friction, shearing, incontinence upon admission and as needed Notes: Wound/Skin Impairment Nursing Diagnoses: Impaired tissue integrity Goals: Ulcer/skin breakdown will have a volume reduction of 30% by week 4 Date Initiated: 08/17/2015 Goal Status: Active Ulcer/skin breakdown will have a volume reduction of 50% by week 8 Date Initiated: 08/17/2015 Goal Status: Active Ulcer/skin breakdown will have a volume reduction of 80% by week 12 Date Initiated: 08/17/2015 Goal Status: Active Interventions: Assess patient/caregiver ability to obtain necessary supplies Assess ulceration(s) every visit Notes: Electronic Signature(s) Signed: 04/20/2016 5:15:35 PM By: Harriette Oharaorthy, Joanna Morgenthaler, Leonette MostHARLES ([id number]) Entered By: Curtis Sitesorthy, Joanna on  04/20/2016 14:10:34 [name], Kennet ([id number]) -------------------------------------------------------------------------------- Pain Assessment Details Patient Name: [name], Demitrious Date of Service: 04/20/2016 1:15 PM Medical Record Number: [id number] Patient Account Number: [id number] Date of Birth/Sex: 22-Feb-1971 [age] y.o. Male) Treating RN: Curtis Sitesorthy, Joanna Primary Care Physician: PATIENT, NO Other Clinician: Referring Physician: Treating Physician/Extender: Rudene ReBritto, Errol Weeks in Treatment: 35 Active Problems Location of Pain Severity and Description of Pain Patient Has Paino No Site Locations Pain Management and Medication Current Pain Management: Notes Topical or injectable lidocaine is offered to patient for acute pain when surgical debridement is performed. If needed, Patient is instructed to use over the counter pain medication for the following 24-48 hours after debridement. Wound care MDs do not prescribed pain medications. Patient has chronic pain or uncontrolled pain. Patient has been instructed to make an appointment with their Primary Care Physician for pain management. Electronic Signature(s) Signed: 04/20/2016 5:15:35 PM By: Curtis Sitesorthy, Joanna Entered By: Curtis Sitesorthy, Joanna on 04/20/2016 14:01:53 [name], [place] ([id number]) -------------------------------------------------------------------------------- Patient/Caregiver Education Details Patient Name: [name], Dacen Date of Service: 04/20/2016 1:15 PM Medical Record Number: [id number] Patient Account Number: [id number] Date of Birth/Gender: 22-Feb-1971 [age] y.o. Male) Treating RN: Curtis Sitesorthy, Joanna Primary Care Physician: PATIENT, NO Other Clinician: Referring Physician: Treating Physician/Extender: Rudene ReBritto, Errol Weeks in Treatment: 35 Education Assessment Education Provided To: Patient Education Topics  Provided Wound/Skin Impairment: Handouts: Other: endoform Methods: Demonstration, Explain/Verbal Responses: State  content correctly Electronic Signature(s) Signed: 04/20/2016 5:15:35 PM By: Curtis Sites Entered By: Curtis Sites on 04/20/2016 16:27:47 Minckler, Leonette Most ([id number]) -------------------------------------------------------------------------------- Wound Assessment Details Patient Name: Matthew Evangelist Date of Service: 04/20/2016 1:15 PM Medical Record Number: [id number] Patient Account Number: [id number] Date of Birth/Sex: 1970/07/07 (45 y.o. Male) Treating RN: Curtis Sites Primary Care Physician: PATIENT, NO Other Clinician: Referring Physician: Treating Physician/Extender: Rudene Re in Treatment: 35 Wound Status Wound Number: 1 Primary Etiology: Pressure Ulcer Wound Location: Left Sacrum Wound Status: Open Wounding Event: Pressure Injury Comorbid History: Paraplegia Date Acquired: 12/24/2014 Weeks Of Treatment: 35 Clustered Wound: No Photos Wound Measurements Length: (cm) 0.7 Width: (cm) 1.2 Depth: (cm) 0.2 Area: (cm) 0.66 Volume: (cm) 0.132 % Reduction in Area: 37.7% % Reduction in Volume: 75.1% Epithelialization: None Tunneling: No Undermining: No Wound Description Classification: Category/Stage III Foul Odor Aft Wound Margin: Thickened Exudate Amount: Large Exudate Type: Serous Exudate Color: amber er Cleansing: No Wound Bed Granulation Amount: Large (67-100%) Exposed Structure Granulation Quality: Pink Fascia Exposed: No Necrotic Amount: Small (1-33%) Fat Layer Exposed: Yes Necrotic Quality: Adherent Slough Tendon Exposed: No Muscle Exposed: No NAFTOLI, PENNY ([id number]) Joint Exposed: No Bone Exposed: No Periwound Skin Texture Texture Color No Abnormalities Noted: No No Abnormalities Noted: No Callus: Yes Temperature / Pain Moisture Temperature: No Abnormality No Abnormalities Noted: No Dry / Scaly: No Maceration: Yes Moist: Yes Wound Preparation Ulcer Cleansing: Rinsed/Irrigated with Saline Topical Anesthetic  Applied: Other: lidocaine 4%, Treatment Notes Wound #1 (Left Sacrum) 1. Cleansed with: Clean wound with Normal Saline 2. Anesthetic Topical Lidocaine 4% cream to wound bed prior to debridement 4. Dressing Applied: Other dressing (specify in notes) 5. Secondary Dressing Applied Dry Gauze Foam Tegaderm Electronic Signature(s) Signed: 04/20/2016 5:15:35 PM By: Curtis Sites Entered By: Curtis Sites on 04/20/2016 15:03:54 Matthew Evangelist ([id number]) -------------------------------------------------------------------------------- Vitals Details Patient Name: Matthew Evangelist Date of Service: 04/20/2016 1:15 PM Medical Record Number: [id number] Patient Account Number: [id number] Date of Birth/Sex: 04/21/1971 (45 y.o. Male) Treating RN: Curtis Sites Primary Care Physician: PATIENT, NO Other Clinician: Referring Physician: Treating Physician/Extender: Rudene Re in Treatment: 35 Vital Signs Time Taken: 14:02 Temperature (F): 98.1 Height (in): 67 Pulse (bpm): 90 Weight (lbs): 147 Respiratory Rate (breaths/min): 18 Body Mass Index (BMI): 23 Blood Pressure (mmHg): 122/76 Reference Range: 80 - 120 mg / dl Electronic Signature(s) Signed: 04/20/2016 5:15:35 PM By: Curtis Sites Entered By: Curtis Sites on 04/20/2016 14:02:14

## 2016-04-21 NOTE — Progress Notes (Addendum)
[name], [name] ([id number]) Visit Report for 04/20/2016 Chief Complaint Document Details Patient Name: [name], [name] Date of Service: 04/20/2016 1:15 PM Medical Record Number: [id number] Patient Account Number: [id number] Date of Birth/Sex: 09-25-1970 93(45 y.o. Male) Treating RN: [name], [name] Primary Care Physician: PATIENT, NO Other Clinician: Referring Physician: Treating Physician/Extender: [name], [name] in Frank: 35 Information Obtained from: Patient Chief Complaint [name]. [name] presents today, after missing last week's appointment, for follow-up on his left buttock stage III pressure ulcer. Electronic Signature(s) Signed: 04/20/2016 2:21:01 PM By: [name], Matthew Canan MD, FACS Entered By: [name], [name] on 04/20/2016 14:21:01 [name], [name] ([id number]) -------------------------------------------------------------------------------- Debridement Details Patient Name: [name], [name] Date of Service: 04/20/2016 1:15 PM Medical Record Number: [id number] Patient Account Number: [id number] Date of Birth/Sex: 09-25-1970 47(45 y.o. Male) Treating RN: [name], [name] Primary Care Physician: PATIENT, NO Other Clinician: Referring Physician: Treating Physician/Extender: [name], [name]: 35 Debridement Performed for Wound #1 Left Sacrum Assessment: Performed By: Physician [name], Matthew Stapleton, MD Debridement: Debridement Pre-procedure Yes - 14:14 Verification/Time Out Taken: Start Time: 14:14 Pain Control: Lidocaine 4% Topical Solution Level: Skin/Subcutaneous Tissue Total Area Debrided (L x 0.7 (cm) x 1.2 (cm) = 0.84 (cm) W): Tissue and other Viable, Non-Viable, Callus, Fibrin/Slough, Subcutaneous material debrided: Instrument: Curette Bleeding: Minimum Hemostasis Achieved: Pressure End Time: 14:17 Procedural Pain: 0 Post Procedural Pain: 0 Response to Frank: Procedure was tolerated well Post Debridement Measurements of Total Wound Length: (cm)  0.7 Stage: Category/Stage III Width: (cm) 1.2 Depth: (cm) 0.2 Volume: (cm) 0.132 Character of Wound/Ulcer Post Improved Debridement: Severity of Tissue Post Fat layer exposed Debridement: Post Procedure Diagnosis Same as Pre-procedure Electronic Signature(s) Signed: 04/20/2016 2:20:56 PM By: [name], Matthew Viernes MD, FACS Signed: 04/20/2016 5:15:35 PM By: [name], [name] Entered By: [name], [name] on 04/20/2016 14:20:56 [name], [name] ([id number]) [name], [name] ([id number]) -------------------------------------------------------------------------------- HPI Details Patient Name: [name], [name] Date of Service: 04/20/2016 1:15 PM Medical Record Number: [id number] Patient Account Number: [id number] Date of Birth/Sex: 09-25-1970 [age] y.o. 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 He was diagnosed with what sounds like a stage III pressure ulcer in September 2016 although this may have been present for some time longer than that perhaps July 2016. He was [place] wound care [place]. By his description there were using Silvadene cream for a period of time and then 3 months ago change to Aquacel Ag which she is continued and changes daily with help from his mother. [place] has recently [place]. He went to urgent care to follow-up on his wound on 08/04/2015 they did a culture of this wound that showed Pseudomonas and MSSA. He was placed on an antibiotic but doesn't remember which one it is. He has continued using Aquacel Ag and a foam based cover. I don't have any of his records from the wound care center in [place] although the patient states he did  have an MRI that did not show osteomyelitis. He is not aware of having a bone culture done and doesn't think that he had exposed bone on this and any point. The patient does in and out cath's and has no urinary incontinence issues. He is on appropriate bowel regimen which she administers suppositories. He is able to position himself appropriately and is wheelchair  to try and avoid pressure over this wound 08/24/15; small horizontal wound over his lower sacral area. Requires debridement of surface slough and nonviable subcutaneous tissue. There is a small amount of undermining noted no evidence of infection. On the right side of this wound there is some senescent rolled edges which may need debridement I did not do this today. 09/01/15 this is a small wound over the lower sacral area. It has tunneling superiorly. This goes roughly 1 cm. Nonviable subcutaneous tissue at the inferior aspect of the wound base. No real evidence of infection 09/07/15; small horizontal wound over the lower sacral area. Last week it had 1 cm of superior tunneling which appears to of resolved. No evidence of infection 09/21/15; continues to have a small horizontal wound over the lower sacral area. We have been using Prisma and this has contracted. He tells me that his mother is aware medication not able to change the dressing although he states that he is able to do this 09/28/15; a small horizontal wound over his lower sacral area is all that is left of this wound which was probably much larger and deeper at one point. Depth today at 0.7 cm. Base of this had some nonviable subcutaneous tissue that I removed. Appears to be stalling on current dressing which is Prisma 10/19/15; I have not seen this patient in several Frank. He arrives today with a linear/horizontal wound across his lower sacral area. Considerable depth of this wound. Extensive debridement done 11/17/15; patient arrives for two-week visit. He is not doing well  wound is bigger with odor and drainage. Has been using Aquacel Ag. I have not seen this in over a month although I think he was seen by the physician covering me 2 Frank ago on 11/05/15 11/24/15; I brought this man back this week to review the wound after last week's deterioration. Culture grew Enterobacter and some staph aureus [MSSA]. I had ordered him Cipro but we don't seem to of been able to communicate with the patient therefore we've called in today. He is not been systemically unwell. Still using Aquacel 12/15/15; very little change in this wound since the last time I saw it. Horizontal wound over the lower sacral Reaser, Majestic ([id number]) area probably about 2 cm in width and 0.8 cm in depth. There is no evidence of infection. There appears to be callus around this. In spite of the patient's best efforts I think there is an adequate pressure relief here. No current evidence of infection 12/22/15; horizontal pressure ulcer over the lower sacral area. Appears to be filling in and has better dimensions in appearance today. Still circumferential callus which would suggest inadequate offloading although everything else appears to be improved 12/29/15 horizontal pressure wound over the lower sacral area. Appears to be filling in however skin groin down into the divot. No evidence of infection 01/05/16; comes in today with not much change in this wound. There is surrounding callus indicative of pressure. Not much change in the wound bed. This clearly require debridement today we have been using Hydrofera Blue 02/04/16: returns today after an absence. no systemic s/s of infection. once again, periwound callus is present suggesting pressure. 02/18/2016 -- she was away for 2 Frank traveling to [place] and during that time he had a new pressure injury to the left ischial tuberosity which he thinks may have been caused while transferring. 03-23-2016; [name]. Wenke returns today, after missing last week's  appointment, for evaluation of his left buttock stage III pressure  ulcer. There is no ulceration to the left ischial tuberosity at this appointment. He states that he has been changing the dressing to his ulcer daily due to daily showering. He voices no complaints or concerns and denies benign antibiotic therapy for any reason at this point. 03/30/2016 -- the patient has been doing fine and after debridement of his wound today I believe is ready for a skin substitute and he has been previously approved for grafix and we will try and obtain this from next week. 04/06/2016 -- the patient's insurance was run again and he had $275 copayment and he has declined the skin substitute. 12/20/17opatient arrived on the wrong day and as he drives from [place] we fit him in the scheduled for review Electronic Signature(s) Signed: 04/20/2016 2:21:05 PM By: Matthew Kanner MD, FACS Entered By: Matthew Kanner on 04/20/2016 14:21:05 LORETO, LOESCHER ([id number]) -------------------------------------------------------------------------------- Physical Exam Details Patient Name: Matthew Evangelist Date of Service: 04/20/2016 1:15 PM Medical Record Number: [id number] Patient Account Number: [id number] Date of Birth/Sex: 07/30/1970 (45 y.o. Male) Treating RN: Matthew Sites Primary Care Physician: PATIENT, NO Other Clinician: Referring Physician: Treating Physician/Extender: Matthew Re in Frank: 35 Constitutional . Pulse regular. Respirations normal and unlabored. Afebrile. . Eyes Nonicteric. Reactive to light. Ears, Nose, Mouth, and Throat Lips, teeth, and gums WNL.Marland Kitchen Moist mucosa without lesions. Neck supple and nontender. No palpable supraclavicular or cervical adenopathy. Normal sized without goiter. Respiratory WNL. No retractions.. Breath sounds WNL, No rubs, rales, rhonchi, or wheeze.. Cardiovascular Heart rhythm and rate regular, no murmur or gallop.. Pedal Pulses WNL. No clubbing,  cyanosis or edema. Chest Breasts symmetical and no nipple discharge.. Breast tissue WNL, no masses, lumps, or tenderness.. Lymphatic No adneopathy. No adenopathy. No adenopathy. Musculoskeletal Adexa without tenderness or enlargement.. Digits and nails w/o clubbing, cyanosis, infection, petechiae, ischemia, or inflammatory conditions.. Integumentary (Hair, Skin) No suspicious lesions. No crepitus or fluctuance. No peri-wound warmth or erythema. No masses.Marland Kitchen Psychiatric Judgement and insight Intact.. No evidence of depression, anxiety, or agitation.. Notes the wound looks good today with minimal debris in the subcutaneous tissue removed sharply with a #3 curet and bleeding controlled with pressure Electronic Signature(s) Signed: 04/20/2016 2:21:36 PM By: Matthew Kanner MD, FACS Entered By: Matthew Kanner on 04/20/2016 14:21:36 Matthew Evangelist ([id number]) -------------------------------------------------------------------------------- Physician Orders Details Patient Name: Matthew Evangelist Date of Service: 04/20/2016 1:15 PM Medical Record Number: [id number] Patient Account Number: [id number] Date of Birth/Sex: 1970-11-18 (45 y.o. Male) Treating RN: Matthew Sites Primary Care Physician: PATIENT, NO Other Clinician: Referring Physician: Treating Physician/Extender: Matthew Re in Frank: 69 Verbal / Phone Orders: Yes Clinician: Curtis Sites Read Back and Verified: Yes Diagnosis Coding Wound Cleansing Wound #1 Left Sacrum o Clean wound with Normal Saline. Anesthetic Wound #1 Left Sacrum o Topical Lidocaine 4% cream applied to wound bed prior to debridement Skin Barriers/Peri-Wound Care Wound #1 Left Sacrum o Skin Prep Primary Wound Dressing Wound #1 Left Sacrum o Prisma Ag - moisten with saline - use endoform until you run out and then go back to using prisma ag Secondary Dressing Wound #1 Left Sacrum o Foam o Tegaderm Dressing Change  Frequency Wound #1 Left Sacrum o Change dressing every other day. Follow-up Appointments Wound #1 Left Sacrum o Return Appointment in 2 Frank. Off-Loading Wound #1 Left Sacrum o Turn and reposition every 2 hours Additional Orders / Instructions ISMAIL, GRAZIANI ([id number]) Wound #1 Left Sacrum o Increase protein intake. Medications-please add to medication list. Wound #1 Left Sacrum o Other: - Vitamin C, A and  Zinc Electronic Signature(s) Signed: 04/20/2016 4:37:15 PM By: Matthew Kanner MD, FACS Signed: 04/20/2016 5:15:35 PM By: Matthew Sites Entered By: Matthew Sites on 04/20/2016 14:21:23 WILBON, OBENCHAIN ([id number]) -------------------------------------------------------------------------------- Problem List Details Patient Name: Matthew Evangelist Date of Service: 04/20/2016 1:15 PM Medical Record Number: [id number] Patient Account Number: [id number] Date of Birth/Sex: Jul 02, 1970 (45 y.o. Male) Treating RN: Matthew Sites Primary Care Physician: PATIENT, NO Other Clinician: Referring Physician: Treating Physician/Extender: Matthew Re in Frank: 35 Active Problems ICD-10 Encounter Code Description Active Date Diagnosis L89.323 Pressure ulcer of left buttock, stage 3 03/23/2016 Yes G82.22 Paraplegia, incomplete 08/17/2015 Yes Inactive Problems Resolved Problems ICD-10 Code Description Active Date Resolved Date L89.322 Pressure ulcer of left buttock, stage 2 02/18/2016 02/18/2016 L89.153 Pressure ulcer of sacral region, stage 3 08/17/2015 08/17/2015 Electronic Signature(s) Signed: 04/20/2016 2:20:45 PM By: Matthew Kanner MD, FACS Entered By: Matthew Kanner on 04/20/2016 14:20:45 Matthew Evangelist ([id number]) -------------------------------------------------------------------------------- Progress Note Details Patient Name: Matthew Evangelist Date of Service: 04/20/2016 1:15 PM Medical Record Number: [id number] Patient Account Number: [id number] Date of  Birth/Sex: 1971/02/18 (45 y.o. Male) Treating RN: Matthew Sites Primary Care Physician: PATIENT, NO Other Clinician: Referring Physician: Treating Physician/Extender: Matthew Re in Frank: 35 Subjective Chief Complaint Information obtained from Patient [name]. Marsalis presents today, after missing last week's appointment, for follow-up on his left buttock stage III pressure ulcer. 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 He was diagnosed with what sounds like a stage III pressure ulcer in September 2016 although this may have been present for some time longer than that perhaps July 2016. He was followed by wound care in [place]. By his description there were using Silvadene cream for a period of time and then 3 months ago change to Aquacel Ag which she is continued and changes daily with help from his mother. He has recently relocated to [facility]. He went to urgent care to follow- up on his wound on 08/04/2015 they did a culture of this wound that showed Pseudomonas and MSSA. He was placed on an antibiotic but doesn't remember which one it is. He has continued using Aquacel Ag and a foam based cover. I don't have any of his records from the wound care center in [place] although the patient states he did have an MRI that did not show osteomyelitis. He is not aware of having a bone culture done and doesn't think that he had exposed bone on this and any point. The patient does in and out cath's and has no urinary incontinence issues. He is on appropriate bowel regimen which she administers suppositories. He is able to position himself appropriately and is wheelchair to try and avoid pressure over this wound 08/24/15; small horizontal wound over his lower sacral area. Requires debridement of surface slough and nonviable subcutaneous tissue.  There is a small amount of undermining noted no evidence of infection. On the right side of this wound there is some senescent rolled edges which may need debridement I did not do this today. 09/01/15 this is a small wound over the lower sacral area. It has tunneling superiorly. This goes roughly 1 cm. Nonviable subcutaneous tissue at the inferior aspect of the wound base. No real evidence of infection 09/07/15; small horizontal wound over the lower sacral area. Last week it had 1 cm of superior tunneling which appears to of resolved. No evidence of infection 09/21/15;  continues to have a small horizontal wound over the lower sacral area. We have been using Prisma and this has contracted. He tells me that his mother is aware medication not able to change the dressing although he states that he is able to do this 09/28/15; a small horizontal wound over his lower sacral area is all that is left of this wound which was probably much larger and deeper at one point. Depth today at 0.7 cm. Base of this had some nonviable subcutaneous tissue that I removed. Appears to be stalling on current dressing which is Prisma 10/19/15; I have not seen this patient in several Frank. He arrives today with a linear/horizontal wound across his lower sacral area. Considerable depth of this wound. Extensive debridement done 11/17/15; patient arrives for two-week visit. He is not doing well wound is bigger with odor and drainage. Has [name], [name] ([id number]) been using Aquacel Ag. I have not seen this in over a month although I think he was seen by the physician covering me 2 Frank ago on 11/05/15 11/24/15; I brought this man back this week to review the wound after last week's deterioration. Culture grew Enterobacter and some staph aureus [MSSA]. I had ordered him Cipro but we don't seem to of been able to communicate with the patient therefore we've called in today. He is not been systemically unwell. Still  using Aquacel 12/15/15; very little change in this wound since the last time I saw it. Horizontal wound over the lower sacral area probably about 2 cm in width and 0.8 cm in depth. There is no evidence of infection. There appears to be callus around this. In spite of the patient's best efforts I think there is an adequate pressure relief here. No current evidence of infection 12/22/15; horizontal pressure ulcer over the lower sacral area. Appears to be filling in and has better dimensions in appearance today. Still circumferential callus which would suggest inadequate offloading although everything else appears to be improved 12/29/15 horizontal pressure wound over the lower sacral area. Appears to be filling in however skin groin down into the divot. No evidence of infection 01/05/16; comes in today with not much change in this wound. There is surrounding callus indicative of pressure. Not much change in the wound bed. This clearly require debridement today we have been using Hydrofera Blue 02/04/16: returns today after an absence. no systemic s/s of infection. once again, periwound callus is present suggesting pressure. 02/18/2016 -- she was away for 2 Frank traveling to [place] and during that time he had a new pressure injury to the left ischial tuberosity which he thinks may have been caused while transferring. 03-23-2016; [name]. [name] returns today, after missing last week's appointment, for evaluation of his left buttock stage III pressure ulcer. There is no ulceration to the left ischial tuberosity at this appointment. He states that he has been changing the dressing to his ulcer daily due to daily showering. He voices no complaints or concerns and denies benign antibiotic therapy for any reason at this point. 03/30/2016 -- the patient has been doing fine and after debridement of his wound today I believe is ready for a skin substitute and he has been previously approved for grafix and we will  try and obtain this from next week. 04/06/2016 -- the patient's insurance was run again and he had $275 copayment and he has declined the skin substitute. 04/12/16 patient arrived on the wrong day and as he drives from [place] we fit him in  the scheduled for review Objective Constitutional Pulse regular. Respirations normal and unlabored. Afebrile. Vitals Time Taken: 2:02 PM, Height: 67 in, Weight: 147 lbs, BMI: 23, Temperature: 98.1 F, Pulse: 90 bpm, Respiratory Rate: 18 breaths/min, Blood Pressure: 122/76 mmHg. Eyes Nonicteric. Reactive to light. ALTA, SHOBER ([id number]) Ears, Nose, Mouth, and Throat Lips, teeth, and gums WNL.Marland Kitchen Moist mucosa without lesions. Neck supple and nontender. No palpable supraclavicular or cervical adenopathy. Normal sized without goiter. Respiratory WNL. No retractions.. Breath sounds WNL, No rubs, rales, rhonchi, or wheeze.. Cardiovascular Heart rhythm and rate regular, no murmur or gallop.. Pedal Pulses WNL. No clubbing, cyanosis or edema. Chest Breasts symmetical and no nipple discharge.. Breast tissue WNL, no masses, lumps, or tenderness.. Lymphatic No adneopathy. No adenopathy. No adenopathy. Musculoskeletal Adexa without tenderness or enlargement.. Digits and nails w/o clubbing, cyanosis, infection, petechiae, ischemia, or inflammatory conditions.Marland Kitchen Psychiatric Judgement and insight Intact.. No evidence of depression, anxiety, or agitation.. General Notes: the wound looks good today with minimal debris in the subcutaneous tissue removed sharply with a #3 curet and bleeding controlled with pressure Integumentary (Hair, Skin) No suspicious lesions. No crepitus or fluctuance. No peri-wound warmth or erythema. No masses.. Wound #1 status is Open. Original cause of wound was Pressure Injury. The wound is located on the Left Sacrum. The wound measures 0.7cm length x 1.2cm width x 0.2cm depth; 0.66cm^2 area and 0.132cm^3 volume. There is fat  exposed. There is no tunneling or undermining noted. There is a large amount of serous drainage noted. The wound margin is thickened. There is large (67-100%) pink granulation within the wound bed. There is a small (1-33%) amount of necrotic tissue within the wound bed including Adherent Slough. The periwound skin appearance exhibited: Callus, Maceration, Moist. The periwound skin appearance did not exhibit: Dry/Scaly. Periwound temperature was noted as No Abnormality. Assessment Active Problems ICD-10 L89.323 - Pressure ulcer of left buttock, stage 3 Sykora, Kenwood ([id number]) G82.22 - Paraplegia, incomplete Procedures Wound #1 Wound #1 is a Pressure Ulcer located on the Left Sacrum . There was a Skin/Subcutaneous Tissue Debridement ([id number]) debridement with total area of 0.84 sq cm performed by Matthew Kanner, MD. with the following instrument(s): Curette to remove Viable and Non-Viable tissue/material including Fibrin/Slough, Callus, and Subcutaneous after achieving pain control using Lidocaine 4% Topical Solution. A time out was conducted at 14:14, prior to the start of the procedure. A Minimum amount of bleeding was controlled with Pressure. The procedure was tolerated well with a pain level of 0 throughout and a pain level of 0 following the procedure. Post Debridement Measurements: 0.7cm length x 1.2cm width x 0.2cm depth; 0.132cm^3 volume. Post debridement Stage noted as Category/Stage III. Character of Wound/Ulcer Post Debridement is improved. Severity of Tissue Post Debridement is: Fat layer exposed. Post procedure Diagnosis Wound #1: Same as Pre-Procedure Plan Wound Cleansing: Wound #1 Left Sacrum: Clean wound with Normal Saline. Anesthetic: Wound #1 Left Sacrum: Topical Lidocaine 4% cream applied to wound bed prior to debridement Skin Barriers/Peri-Wound Care: Wound #1 Left Sacrum: Skin Prep Primary Wound Dressing: Wound #1 Left Sacrum: Prisma Ag - moisten with  saline - use endoform until you run out and then go back to using prisma ag Secondary Dressing: Wound #1 Left Sacrum: Foam Tegaderm Dressing Change Frequency: Wound #1 Left Sacrum: Change dressing every other day. Follow-up Appointments: Wound #1 Left Sacrum: CHRISTIEN, FRANKL ([id number]) Return Appointment in 2 Frank. Off-Loading: Wound #1 Left Sacrum: Turn and reposition every 2 hours Additional Orders / Instructions: Wound #1 Left Sacrum: Increase  protein intake. Medications-please add to medication list.: Wound #1 Left Sacrum: Other: - Vitamin C, A and Zinc Since we cannot use a cellular or tissue base product because of the cost, I have used Endoform today and we can see if this is available for regular use. We will change this every other day. offloading, protein supplements, vitamin E, vitamin C and zinc have been discussed with him in great detail Electronic Signature(s) Signed: 04/21/2016 4:37:56 PM By: Matthew Kanner MD, FACS Previous Signature: 04/20/2016 2:22:55 PM Version By: Matthew Kanner MD, FACS Entered By: Matthew Kanner on 04/21/2016 16:37:56 ARBER, WIEMERS ([id number]) -------------------------------------------------------------------------------- SuperBill Details Patient Name: Matthew Evangelist Date of Service: 04/20/2016 Medical Record Number: [id number] Patient Account Number: [id number] Date of Birth/Sex: [birth year] (45 y.o. Male) Treating RN: Matthew Sites Primary Care Physician: PATIENT, NO Other Clinician: Referring Physician: Treating Physician/Extender: Matthew Re in Frank: 33 Diagnosis Coding ICD-10 Codes Code Description [phone] Pressure ulcer of left buttock, stage 3 G82.22 Paraplegia, incomplete Facility Procedures CPT4 Code: [id number] Description: 11042 - DEB SUBQ TISSUE 20 SQ CM/< ICD-10 Description Diagnosis L89.323 Pressure ulcer of left buttock, stage 3 G82.22 Paraplegia, incomplete Modifier: Quantity: 1 Physician  Procedures CPT4 Code: 8469629 Description: 11042 - WC PHYS SUBQ TISS 20 SQ CM ICD-10 Description Diagnosis L89.323 Pressure ulcer of left buttock, stage 3 G82.22 Paraplegia, incomplete Modifier: Quantity: 1 Electronic Signature(s) Signed: 04/20/2016 2:23:08 PM By: Matthew Kanner MD, FACS Entered By: Matthew Kanner on 04/20/2016 14:23:08

## 2016-04-26 ENCOUNTER — Ambulatory Visit (HOSPITAL_COMMUNITY)
Admission: RE | Admit: 2016-04-26 | Discharge: 2016-04-26 | Disposition: A | Discharge status: Home / Self Care | Payer: PRIVATE HEALTH INSURANCE | Source: Ambulatory Visit | Attending: Physical Medicine & Rehabilitation | Admitting: Physical Medicine & Rehabilitation

## 2016-04-26 ENCOUNTER — Other Ambulatory Visit: Payer: Self-pay | Admitting: Physical Medicine & Rehabilitation

## 2016-04-26 ENCOUNTER — Encounter: Payer: Self-pay | Admitting: Physical Medicine & Rehabilitation

## 2016-04-26 ENCOUNTER — Encounter
Payer: Worker's Compensation | Attending: Physical Medicine & Rehabilitation | Admitting: Physical Medicine & Rehabilitation

## 2016-04-26 VITALS — BP 112/67 | HR 90

## 2016-04-26 DIAGNOSIS — M25551 Pain in right hip: Secondary | ICD-10-CM

## 2016-04-26 DIAGNOSIS — G8222 Paraplegia, incomplete: Secondary | ICD-10-CM | POA: Diagnosis not present

## 2016-04-26 DIAGNOSIS — G479 Sleep disorder, unspecified: Secondary | ICD-10-CM | POA: Insufficient documentation

## 2016-04-26 DIAGNOSIS — K592 Neurogenic bowel, not elsewhere classified: Secondary | ICD-10-CM | POA: Insufficient documentation

## 2016-04-26 DIAGNOSIS — S31000A Unspecified open wound of lower back and pelvis without penetration into retroperitoneum, initial encounter: Secondary | ICD-10-CM | POA: Insufficient documentation

## 2016-04-26 DIAGNOSIS — Z9889 Other specified postprocedural states: Secondary | ICD-10-CM | POA: Diagnosis not present

## 2016-04-26 DIAGNOSIS — G8220 Paraplegia, unspecified: Secondary | ICD-10-CM | POA: Insufficient documentation

## 2016-04-26 DIAGNOSIS — N319 Neuromuscular dysfunction of bladder, unspecified: Secondary | ICD-10-CM | POA: Diagnosis not present

## 2016-04-26 DIAGNOSIS — R293 Abnormal posture: Secondary | ICD-10-CM | POA: Insufficient documentation

## 2016-04-26 DIAGNOSIS — M5136 Other intervertebral disc degeneration, lumbar region: Secondary | ICD-10-CM | POA: Insufficient documentation

## 2016-04-26 DIAGNOSIS — R252 Cramp and spasm: Secondary | ICD-10-CM | POA: Insufficient documentation

## 2016-04-26 DIAGNOSIS — M62838 Other muscle spasm: Secondary | ICD-10-CM

## 2016-04-26 DIAGNOSIS — M4186 Other forms of scoliosis, lumbar region: Secondary | ICD-10-CM | POA: Insufficient documentation

## 2016-04-26 DIAGNOSIS — S31000D Unspecified open wound of lower back and pelvis without penetration into retroperitoneum, subsequent encounter: Secondary | ICD-10-CM

## 2016-04-26 DIAGNOSIS — Z5189 Encounter for other specified aftercare: Secondary | ICD-10-CM | POA: Insufficient documentation

## 2016-04-26 DIAGNOSIS — R531 Weakness: Secondary | ICD-10-CM | POA: Diagnosis present

## 2016-04-26 MED ORDER — BACLOFEN 10 MG PO TABS: 30.0000 mg | ORAL_TABLET | Freq: Three times a day (TID) | ORAL | 1 refills | Status: DC

## 2016-04-26 MED ORDER — SENNA 8.6 MG PO TABS: 1.0000 | ORAL_TABLET | Freq: Every day | ORAL | 1 refills | Status: DC

## 2016-04-26 NOTE — Progress Notes (Signed)
Subjective:    Patient ID: [name], male    DOB: 10/31/1970, 46 y.o.   MRN: [id number]  HPI  46 y/o male with pmh of T8 SCI 2007 and psh of baclofen pump removal in 04/2013 presents for follow up of for management of his SCI.  His main issues were his postures and asymmetrical weakness and spasms.  Pt had his ITB pump removed due to inconvenience and stressors of withdrawal due to pump failure. He was living [place] and relocated to [place].  He is looking to establish care as well.  He has never had AD.  He does AM bowel program.  No movement or sensation below T8. He is independent, lives with his mother.  For spasms, he has tried oral baclofen 20mg  TID, but made him lethargic.  He took valium, which he usually took at night.    Last clinic visit 01/12/16.  Pt has still not seen PT.  He states he went to [place] and his chair was damaged in the process and is getting it fixes.  He is seeing Psychology, which he states is helping.  Pt was never called for mentoring at the hospital.  His bowel/bladder program have been effective. His ulcer has almost healed.  Since last visit his bowel and sleep have been his main issues, more recently.    Pain Inventory Average Pain 8 Pain Right Now 7 My pain is sharp, stabbing, tingling and aching  In the last 24 hours, has pain interfered with the following? General activity 8 Relation with others 6 Enjoyment of life 7 What TIME of day is your pain at its worst? all Sleep (in general) Poor  Pain is worse with: walking, bending, sitting and inactivity Pain improves with: rest and therapy/exercise Relief from Meds: 1  Mobility use a wheelchair transfers alone  Function disabled: date disabled .  Neuro/Psych bladder control problems weakness numbness tremor tingling trouble walking spasms depression anxiety  Prior Studies Any changes since last visit?  no  Physicians involved in your care Any changes since last visit?  no   No  family history on file. Social History   Social History  . Marital status: Divorced    Spouse name: N/A  . Number of children: N/A  . Years of education: N/A   Social History Main Topics  . Smoking status: Current Every Day Smoker    Packs/day: 0.50    Types: Cigarettes  . Smokeless tobacco: Current User  . Alcohol use 0.0 oz/week  . Drug use: Unknown  . Sexual activity: Not Asked   Other Topics Concern  . None   Social History Narrative  . None   Past Surgical History:  Procedure Laterality Date  . PROGRAMABLE BACLOFEN PUMP REVISION Left 2014   Removal    Past Medical History:  Diagnosis Date  . Thoracic spinal cord injury (HCC)    BP 112/67 (BP Location: Right Arm, Patient Position: Sitting, Cuff Size: Large)   Pulse 90   SpO2 98%   Opioid Risk Score:   Fall Risk Score:  `1  Depression screen PHQ 2/9  Depression screen PHQ 2/9 10/28/2015  Decreased Interest 1  Down, Depressed, Hopeless 1  PHQ - 2 Score 2  Altered sleeping 2  Tired, decreased energy 1  Change in appetite 0  Feeling bad or failure about yourself  1  Trouble concentrating 1  Moving slowly or fidgety/restless 1  Suicidal thoughts 0  PHQ-9 Score 8  Difficult doing work/chores Somewhat  difficult    Review of Systems  Constitutional: Positive for diaphoresis.       Bladder control problems   HENT: Negative.   Eyes: Negative.   Respiratory: Negative.   Gastrointestinal: Positive for abdominal pain.  Endocrine: Negative.   Genitourinary: Negative.   Allergic/Immunologic: Negative.   Neurological: Positive for tremors, weakness and numbness.       Tingling Spasms   Hematological: Negative.   Psychiatric/Behavioral: Positive for dysphoric mood. The patient is nervous/anxious.   All other systems reviewed and are negative.     Objective:   Physical Exam Gen: NAD. Vital signs reviewed HENT: Normocephalic, Atraumatic Eyes: EOMI. No discharge.  Cardio: RRR. No JVD.  Pulm: B/l clear to  auscultation.  Effort normal Abd: Soft. BS+ MSK:  Gait nonambulatory.   No TTP.    No edema. Neuro: Sensation absent below T8 dermatomes  Reflexes 1+ B/l LE  Strength  5/5 in all UE myotomes    0/5 in all LE myotomes  Significant spasms in B/l LE, making it difficult to assess spasticity, most pronounced in adductors Skin: Warm and Dry, ulcer left sacral area (continues to improve)    Assessment & Plan:  46 y/o male with pmh of T8 SCI 2007 and psh of baclofen pump removal in 04/2013 presents for follow up of for management of his SCI.  1. T8 Paraplegia  Encouraged pt to follow up with PT for revaluation, postural exercises (He states he is in the process of getting his chair fixed and then getting eval.  He states he was told that they did not have any exercises for him)  Cont ROM, stretching  Cont follow up with Psychology   UDS +THC previously   Will follow for pt to be involved as a Dance movement psychotherapistmentor at hospital - discussed with pt and he is willing and eager to assist, still has not received a call back.    2. Neurogenic Bladder  Cont to follow with Urology  3. Neurogenic bowel  Cont bowel reg with suppository  4. Spasticity/Spasms   Had ITB pump, but did not want to keep due to convenience and stressors of withdrawals  Valium ineffective  Baclofen increased to 30 TID  Likely exacerbated by gas and ulcers  5. Abnormal posture  Encourage pt to follow up with PT for postural stabilization and seating evaluation, in process  Xrays ordered   6. Left lower back open wound  Cont to follow with wound care with dressing changes  Improving  7. Sleep disturbance  Mainly because of gas at present  8. Gas  Simethicone ineffective  Will order Senna  Will order Xray  ?SMA, will cont to evaluate, however, appears less likely at this point given lack of other symptoms  Will consider FES  9. Right hip pain  Xray ordered  >45 minutes spent with patient with >40 minutes spent in  counseling regarding gas, SCI, neurogenic bladder, postural excercises

## 2016-04-27 ENCOUNTER — Encounter: Payer: Medicare Other | Attending: Surgery | Admitting: Surgery

## 2016-04-27 DIAGNOSIS — L89153 Pressure ulcer of sacral region, stage 3: Secondary | ICD-10-CM | POA: Diagnosis not present

## 2016-04-27 DIAGNOSIS — G8222 Paraplegia, incomplete: Secondary | ICD-10-CM | POA: Diagnosis not present

## 2016-04-27 DIAGNOSIS — L89323 Pressure ulcer of left buttock, stage 3: Secondary | ICD-10-CM | POA: Insufficient documentation

## 2016-04-28 NOTE — Progress Notes (Signed)
SIE, FORMISANO ([id number]) Visit Report for 04/27/2016 Chief Complaint Document Details Patient Name: [name], [name] Date of Service: 04/27/2016 2:45 PM Medical Record Number: [id number] Patient Account Number: [id number] Date of Birth/Sex: 09-01-1970 (46 y.o. Male) Treating RN: Curtis Sites Primary Care Physician: PATIENT, NO Other Clinician: Referring Physician: Treating Physician/Extender: Rudene Re in Treatment: 24 Information Obtained from: Patient Chief Complaint Mr. Lachman presents today, after missing last week's appointment, for follow-up on his left buttock stage III pressure ulcer. Electronic Signature(s) Signed: 04/27/2016 3:23:33 PM By: Evlyn Kanner MD, FACS Entered By: Evlyn Kanner on 04/27/2016 15:23:33 JOSEHUA, HAMMAR ([id number]) -------------------------------------------------------------------------------- Debridement Details Patient Name: [name] Date of Service: 04/27/2016 2:45 PM Medical Record Number: [id number] Patient Account Number: [id number] Date of Birth/Sex: 18-May-1970 (46 y.o. Male) Treating RN: Curtis Sites Primary Care Physician: PATIENT, NO Other Clinician: Referring Physician: Treating Physician/Extender: Rudene Re in Treatment: 36 Debridement Performed for Wound #1 Left Sacrum Assessment: Performed By: Physician Evlyn Kanner, MD Debridement: Debridement Pre-procedure Yes - 15:15 Verification/Time Out Taken: Start Time: 15:16 Pain Control: Other : lidocaine 4% Level: Skin/Subcutaneous Tissue Total Area Debrided (L x 0.6 (cm) x 0.9 (cm) = 0.54 (cm) W): Tissue and other Viable, Fibrin/Slough, Skin, Subcutaneous material debrided: Instrument: Curette Bleeding: Minimum Hemostasis Achieved: Pressure End Time: 15:19 Procedural Pain: Insensate Post Procedural Pain: Insensate Response to Treatment: Procedure was tolerated well Post Debridement Measurements of Total Wound Length: (cm) 0.6 Stage:  Category/Stage III Width: (cm) 0.9 Depth: (cm) 0.3 Volume: (cm) 0.127 Character of Wound/Ulcer Post Requires Further Debridement: Debridement Severity of Tissue Post Fat layer exposed Debridement: Post Procedure Diagnosis Same as Pre-procedure Electronic Signature(s) Signed: 04/27/2016 3:23:27 PM By: Evlyn Kanner MD, FACS Signed: 04/27/2016 3:52:15 PM By: Curtis Sites Entered By: Evlyn Kanner on 04/27/2016 15:23:27 DARDAN, SHELTON ([id number]) BURL, TAUZIN ([id number]) -------------------------------------------------------------------------------- HPI Details Patient Name: [name] Date of Service: 04/27/2016 2:45 PM Medical Record Number: [id number] Patient Account Number: [id number] Date of Birth/Sex: 22-Feb-1971 (46 y.o. 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 He was diagnosed with what sounds like a stage III pressure ulcer in September 2016 although this may have been present for some time longer than that perhaps July 2016. He was followed by wound care in [place]. By his description there were using Silvadene cream for a period of time and then 3 months ago change to Aquacel Ag which she is continued and changes daily with help from his mother. He has recently relocated to [facility]. He went to urgent care to follow-up on his wound on 08/04/2015 they did a culture of this wound that showed Pseudomonas and MSSA. He was placed on an antibiotic but doesn't remember which one it is. He has continued using Aquacel Ag and a foam based cover. I don't have any of his records from the wound care center in [place] although the patient states he did  have an MRI that did not show osteomyelitis. He is not aware of having a bone culture done and doesn't think that he had exposed bone on this and any point. The patient does in and out cath's and has no urinary incontinence issues. He is on appropriate bowel regimen which she administers suppositories. He is able to position himself appropriately and is  wheelchair to try and avoid pressure over this wound 08/24/15; small horizontal wound over his lower sacral area. Requires debridement of surface slough and nonviable subcutaneous tissue. There is a small amount of undermining noted no evidence of infection. On the right side of this wound there is some senescent rolled edges which may need debridement I did not do this today. 09/01/15 this is a small wound over the lower sacral area. It has tunneling superiorly. This goes roughly 1 cm. Nonviable subcutaneous tissue at the inferior aspect of the wound base. No real evidence of infection 09/07/15; small horizontal wound over the lower sacral area. Last week it had 1 cm of superior tunneling which appears to of resolved. No evidence of infection 09/21/15; continues to have a small horizontal wound over the lower sacral area. We have been using Prisma and this has contracted. He tells me that his mother is aware medication not able to change the dressing although he states that he is able to do this 09/28/15; a small horizontal wound over his lower sacral area is all that is left of this wound which was probably much larger and deeper at one point. Depth today at 0.7 cm. Base of this had some nonviable subcutaneous tissue that I removed. Appears to be stalling on current dressing which is Prisma 10/19/15; I have not seen this patient in several weeks. He arrives today with a linear/horizontal wound across his lower sacral area. Considerable depth of this wound. Extensive debridement done 11/17/15; patient arrives for two-week visit. He is not doing well  wound is bigger with odor and drainage. Has been using Aquacel Ag. I have not seen this in over a month although I think he was seen by the physician covering me 2 weeks ago on 11/05/15 11/24/15; I brought this man back this week to review the wound after last week's deterioration. Culture grew Enterobacter and some staph aureus [MSSA]. I had ordered him Cipro but we don't seem to of been able to communicate with the patient therefore we've called in today. He is not been systemically unwell. Still using Aquacel 12/15/15; very little change in this wound since the last time I saw it. Horizontal wound over the lower sacral Armenteros, Johntae ([id number]) area probably about 2 cm in width and 0.8 cm in depth. There is no evidence of infection. There appears to be callus around this. In spite of the patient's best efforts I think there is an adequate pressure relief here. No current evidence of infection 12/22/15; horizontal pressure ulcer over the lower sacral area. Appears to be filling in and has better dimensions in appearance today. Still circumferential callus which would suggest inadequate offloading although everything else appears to be improved 12/29/15 horizontal pressure wound over the lower sacral area. Appears to be filling in however skin groin down into the divot. No evidence of infection 01/05/16; comes in today with not much change in this wound. There is surrounding callus indicative of pressure. Not much change in the wound bed. This clearly require debridement today we have been using Hydrofera Blue 02/04/16: returns today after an absence. no systemic s/s of infection. once again, periwound callus is present suggesting pressure. 02/18/2016 -- she was away for 2 weeks traveling to [place] and during that time he had a new pressure injury to the left ischial tuberosity which he thinks may have been caused while transferring. 03-23-2016; Mr. Mcpheeters returns today, after missing last week's  appointment, for evaluation of his left buttock stage III  pressure ulcer. There is no ulceration to the left ischial tuberosity at this appointment. He states that he has been changing the dressing to his ulcer daily due to daily showering. He voices no complaints or concerns and denies benign antibiotic therapy for any reason at this point. 03/30/2016 -- the patient has been doing fine and after debridement of his wound today I believe is ready for a skin substitute and he has been previously approved for grafix and we will try and obtain this from next week. 04/06/2016 -- the patient's insurance was run again and he had $275 copayment and he has declined the skin substitute. 12/20/17opatient arrived on the wrong day and as he drives from [place] we fit him in the scheduled for review Electronic Signature(s) Signed: 04/27/2016 3:23:42 PM By: Evlyn Kanner MD, FACS Entered By: Evlyn Kanner on 04/27/2016 15:23:42 TAMON, PARKERSON ([id number]) -------------------------------------------------------------------------------- Physical Exam Details Patient Name: [name] Date of Service: 04/27/2016 2:45 PM Medical Record Number: [id number] Patient Account Number: [id number] Date of Birth/Sex: 05/31/1970 (46 y.o. Male) Treating RN: Curtis Sites Primary Care Physician: PATIENT, NO Other Clinician: Referring Physician: Treating Physician/Extender: Rudene Re in Treatment: 36 Constitutional . Pulse regular. Respirations normal and unlabored. Afebrile. . Eyes Nonicteric. Reactive to light. Ears, Nose, Mouth, and Throat Lips, teeth, and gums WNL.Marland Kitchen Moist mucosa without lesions. Neck supple and nontender. No palpable supraclavicular or cervical adenopathy. Normal sized without goiter. Respiratory WNL. No retractions.. Breath sounds WNL, No rubs, rales, rhonchi, or wheeze.. Cardiovascular Heart rhythm and rate regular, no murmur or gallop.. Pedal Pulses WNL. No clubbing, cyanosis  or edema. Chest Breasts symmetical and no nipple discharge.. Breast tissue WNL, no masses, lumps, or tenderness.. Lymphatic No adneopathy. No adenopathy. No adenopathy. Musculoskeletal Adexa without tenderness or enlargement.. Digits and nails w/o clubbing, cyanosis, infection, petechiae, ischemia, or inflammatory conditions.. Integumentary (Hair, Skin) No suspicious lesions. No crepitus or fluctuance. No peri-wound warmth or erythema. No masses.Marland Kitchen Psychiatric Judgement and insight Intact.. No evidence of depression, anxiety, or agitation.. Notes the wound had some subcutaneous debris which I sharply removed with a #3 curet and overall is looking excellent. Electronic Signature(s) Signed: 04/27/2016 3:24:01 PM By: Evlyn Kanner MD, FACS Entered By: Evlyn Kanner on 04/27/2016 15:24:01 BURGESS, SHERIFF ([id number]) -------------------------------------------------------------------------------- Physician Orders Details Patient Name: [name] Date of Service: 04/27/2016 2:45 PM Medical Record Number: [id number] Patient Account Number: [id number] Date of Birth/Sex: [birth year] (46 y.o. Male) Treating RN: Curtis Sites Primary Care Physician: PATIENT, NO Other Clinician: Referring Physician: Treating Physician/Extender: Rudene Re in Treatment: 22 Verbal / Phone Orders: No Diagnosis Coding ICD-10 Coding Code Description L89.323 Pressure ulcer of left buttock, stage 3 G82.22 Paraplegia, incomplete Wound Cleansing Wound #1 Left Sacrum o Clean wound with Normal Saline. Anesthetic Wound #1 Left Sacrum o Topical Lidocaine 4% cream applied to wound bed prior to debridement Skin Barriers/Peri-Wound Care Wound #1 Left Sacrum o Barrier cream Primary Wound Dressing Wound #1 Left Sacrum o Prisma Ag Secondary Dressing Wound #1 Left Sacrum o Other - telfa island Dressing Change Frequency Wound #1 Left Sacrum o Other: - twice weekly Follow-up  Appointments Wound #1 Left Sacrum o Return Appointment in 1 week. Off-Loading PATON, CRUM ([id number]) Wound #1 Left Sacrum o Roho cushion for wheelchair o Turn and reposition every 2 hours Electronic Signature(s) Signed: 04/27/2016 3:33:13 PM By: Evlyn Kanner MD, FACS Signed: 04/27/2016 4:15:25 PM By: Elliot Gurney RN, BSN, Kim RN, BSN Previous Signature: 04/27/2016 3:24:08 PM Version By: Evlyn Kanner MD, FACS Entered By: Elliot Gurney, RN, BSN,  Kim on 04/27/2016 15:29:27 ZOLTAN, GENEST ([id number]) -------------------------------------------------------------------------------- Problem List Details Patient Name: GUERINO, CAPORALE Date of Service: 04/27/2016 2:45 PM Medical Record Number: [id number] Patient Account Number: [id number] Date of Birth/Sex: April 26, 1970 (46 y.o. Male) Treating RN: Curtis Sites Primary Care Physician: PATIENT, NO Other Clinician: Referring Physician: Treating Physician/Extender: Rudene Re in Treatment: 31 Active Problems ICD-10 Encounter Code Description Active Date Diagnosis L89.323 Pressure ulcer of left buttock, stage 3 03/23/2016 Yes G82.22 Paraplegia, incomplete 08/17/2015 Yes Inactive Problems Resolved Problems ICD-10 Code Description Active Date Resolved Date L89.322 Pressure ulcer of left buttock, stage 2 02/18/2016 02/18/2016 L89.153 Pressure ulcer of sacral region, stage 3 08/17/2015 08/17/2015 Electronic Signature(s) Signed: 04/27/2016 3:22:32 PM By: Evlyn Kanner MD, FACS Entered By: Evlyn Kanner on 04/27/2016 15:22:32 ZEPHYR, RIDLEY ([id number]) -------------------------------------------------------------------------------- Progress Note Details Patient Name: [name] Date of Service: 04/27/2016 2:45 PM Medical Record Number: [id number] Patient Account Number: [id number] Date of Birth/Sex: 1970-08-14 (46 y.o. Male) Treating RN: Curtis Sites Primary Care Physician: PATIENT, NO Other Clinician: Referring Physician: Treating  Physician/Extender: Rudene Re in Treatment: 36 Subjective Chief Complaint Information obtained from Patient Mr. Dinino presents today, after missing last week's appointment, for follow-up on his left buttock stage III pressure ulcer. 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 He was diagnosed with what sounds like a stage III pressure ulcer in September 2016 although this may have been present for some time longer than that perhaps July 2016. He was followed by wound care in [place]. By his description there were using Silvadene cream for a period of time and then 3 months ago change to Aquacel Ag which she is continued and changes daily with help from his mother. He has recently relocated to [facility]. He went to urgent care to follow- up on his wound on 08/04/2015 they did a culture of this wound that showed Pseudomonas and MSSA. He was placed on an antibiotic but doesn't remember which one it is. He has continued using Aquacel Ag and a foam based cover. I don't have any of his records from the wound care center in [place] although the patient states he did have an MRI that did not show osteomyelitis. He is not aware of having a bone culture done and doesn't think that he had exposed bone on this and any point. The patient does in and out cath's and has no urinary incontinence issues. He is on appropriate bowel regimen which she administers suppositories. He is able to position himself appropriately and is wheelchair to try and avoid pressure over this wound 08/24/15; small horizontal wound over his lower sacral area. Requires debridement of surface slough and nonviable subcutaneous tissue. There is a small amount of undermining noted no evidence of infection. On the right side of this wound there is some senescent rolled edges which may need  debridement I did not do this today. 09/01/15 this is a small wound over the lower sacral area. It has tunneling superiorly. This goes roughly 1 cm. Nonviable subcutaneous tissue at the inferior aspect of the wound base. No real evidence of infection 09/07/15; small horizontal wound over the lower sacral area. Last week it had 1 cm of superior tunneling which appears to of resolved. No evidence of infection 09/21/15; continues to have a small horizontal wound over the lower sacral area. We have been using Prisma and this has contracted. He  tells me that his mother is aware medication not able to change the dressing although he states that he is able to do this 09/28/15; a small horizontal wound over his lower sacral area is all that is left of this wound which was probably much larger and deeper at one point. Depth today at 0.7 cm. Base of this had some nonviable subcutaneous tissue that I removed. Appears to be stalling on current dressing which is Prisma 10/19/15; I have not seen this patient in several weeks. He arrives today with a linear/horizontal wound across his lower sacral area. Considerable depth of this wound. Extensive debridement done 11/17/15; patient arrives for two-week visit. He is not doing well wound is bigger with odor and drainage. Has EDWEN, MCLESTER ([id number]) been using Aquacel Ag. I have not seen this in over a month although I think he was seen by the physician covering me 2 weeks ago on 11/05/15 11/24/15; I brought this man back this week to review the wound after last week's deterioration. Culture grew Enterobacter and some staph aureus [MSSA]. I had ordered him Cipro but we don't seem to of been able to communicate with the patient therefore we've called in today. He is not been systemically unwell. Still using Aquacel 12/15/15; very little change in this wound since the last time I saw it. Horizontal wound over the lower sacral area probably about 2 cm in width and 0.8 cm  in depth. There is no evidence of infection. There appears to be callus around this. In spite of the patient's best efforts I think there is an adequate pressure relief here. No current evidence of infection 12/22/15; horizontal pressure ulcer over the lower sacral area. Appears to be filling in and has better dimensions in appearance today. Still circumferential callus which would suggest inadequate offloading although everything else appears to be improved 12/29/15 horizontal pressure wound over the lower sacral area. Appears to be filling in however skin groin down into the divot. No evidence of infection 01/05/16; comes in today with not much change in this wound. There is surrounding callus indicative of pressure. Not much change in the wound bed. This clearly require debridement today we have been using Hydrofera Blue 02/04/16: returns today after an absence. no systemic s/s of infection. once again, periwound callus is present suggesting pressure. 02/18/2016 -- she was away for 2 weeks traveling to [place] and during that time he had a new pressure injury to the left ischial tuberosity which he thinks may have been caused while transferring. 03-23-2016; Mr. Trebilcock returns today, after missing last week's appointment, for evaluation of his left buttock stage III pressure ulcer. There is no ulceration to the left ischial tuberosity at this appointment. He states that he has been changing the dressing to his ulcer daily due to daily showering. He voices no complaints or concerns and denies benign antibiotic therapy for any reason at this point. 03/30/2016 -- the patient has been doing fine and after debridement of his wound today I believe is ready for a skin substitute and he has been previously approved for grafix and we will try and obtain this from next week. 04/06/2016 -- the patient's insurance was run again and he had $275 copayment and he has declined the skin substitute. 04/12/16  patient arrived on the wrong day and as he drives from [place] we fit him in the scheduled for review Objective Constitutional Pulse regular. Respirations normal and unlabored. Afebrile. Vitals Time Taken: 3:07 PM, Height: 67 in, Weight:  147 lbs, BMI: 23, Temperature: 97.5 F, Pulse: 93 bpm, Respiratory Rate: 16 breaths/min, Blood Pressure: 144/82 mmHg. Eyes Nonicteric. Reactive to light. KHYLEN, RIOLO ([id number]) Ears, Nose, Mouth, and Throat Lips, teeth, and gums WNL.Marland Kitchen Moist mucosa without lesions. Neck supple and nontender. No palpable supraclavicular or cervical adenopathy. Normal sized without goiter. Respiratory WNL. No retractions.. Breath sounds WNL, No rubs, rales, rhonchi, or wheeze.. Cardiovascular Heart rhythm and rate regular, no murmur or gallop.. Pedal Pulses WNL. No clubbing, cyanosis or edema. Chest Breasts symmetical and no nipple discharge.. Breast tissue WNL, no masses, lumps, or tenderness.. Lymphatic No adneopathy. No adenopathy. No adenopathy. Musculoskeletal Adexa without tenderness or enlargement.. Digits and nails w/o clubbing, cyanosis, infection, petechiae, ischemia, or inflammatory conditions.Marland Kitchen Psychiatric Judgement and insight Intact.. No evidence of depression, anxiety, or agitation.. General Notes: the wound had some subcutaneous debris which I sharply removed with a #3 curet and overall is looking excellent. Integumentary (Hair, Skin) No suspicious lesions. No crepitus or fluctuance. No peri-wound warmth or erythema. No masses.. Wound #1 status is Open. Original cause of wound was Pressure Injury. The wound is located on the Left Sacrum. The wound measures 0.6cm length x 0.9cm width x 0.2cm depth; 0.424cm^2 area and 0.085cm^3 volume. There is fat exposed. There is no tunneling noted, however, there is undermining starting at 10:00 and ending at 2:00 with a maximum distance of 0.3cm. There is a large amount of serous drainage noted. The wound  margin is epibole. There is large (67-100%) pink, pale granulation within the wound bed. There is a small (1-33%) amount of necrotic tissue within the wound bed including Adherent Slough. The periwound skin appearance exhibited: Callus, Moist. The periwound skin appearance did not exhibit: Dry/Scaly, Maceration. Periwound temperature was noted as No Abnormality. Assessment Active Problems ICD-10 KUMAR, FALWELL ([id number]) L89.323 - Pressure ulcer of left buttock, stage 3 G82.22 - Paraplegia, incomplete Procedures Wound #1 Wound #1 is a Pressure Ulcer located on the Left Sacrum . There was a Skin/Subcutaneous Tissue Debridement ([id number]) debridement with total area of 0.54 sq cm performed by Evlyn Kanner, MD. with the following instrument(s): Curette to remove Viable tissue/material including Fibrin/Slough, Skin, and Subcutaneous after achieving pain control using Other (lidocaine 4%). A time out was conducted at 15:15, prior to the start of the procedure. A Minimum amount of bleeding was controlled with Pressure. The procedure was tolerated well with a pain level of Insensate throughout and a pain level of Insensate following the procedure. Post Debridement Measurements: 0.6cm length x 0.9cm width x 0.3cm depth; 0.127cm^3 volume. Post debridement Stage noted as Category/Stage III. Character of Wound/Ulcer Post Debridement requires further debridement. Severity of Tissue Post Debridement is: Fat layer exposed. Post procedure Diagnosis Wound #1: Same as Pre-Procedure Plan Wound Cleansing: Wound #1 Left Sacrum: Clean wound with Normal Saline. Anesthetic: Wound #1 Left Sacrum: Topical Lidocaine 4% cream applied to wound bed prior to debridement Skin Barriers/Peri-Wound Care: Wound #1 Left Sacrum: Barrier cream Primary Wound Dressing: Wound #1 Left Sacrum: Prisma Ag Secondary Dressing: Wound #1 Left Sacrum: Other - telfa island Dressing Change Frequency: Wound #1 Left  Sacrum: Other: - twice weekly Follow-up Appointments: Wound #1 Left SacrumANIKIN, PROSSER ([id number]) Return Appointment in 1 week. Off-Loading: Wound #1 Left Sacrum: Roho cushion for wheelchair Turn and reposition every 2 hours I have used Prisma Ag today and we can see if he can use the endoform we gave him a sample last week, along with the Prisma every other day. We will continue offloading, protein  supplements, vitamin E, vitamin C and zinc have been discussed with him in great detail Electronic Signature(s) Signed: 04/27/2016 3:32:37 PM By: Evlyn KannerBritto, Batsheva Stevick MD, FACS Previous Signature: 04/27/2016 3:26:05 PM Version By: Evlyn KannerBritto, Shrinika Blatz MD, FACS Entered By: Evlyn KannerBritto, Lashaun Krapf on 04/27/2016 15:32:37 Matthew EvangelistHARRIS, Markos ([id number]) -------------------------------------------------------------------------------- SuperBill Details Patient Name: Matthew EvangelistHARRIS, Merrick Date of Service: 04/27/2016 Medical Record Number: [id number] Patient Account Number: [id number] Date of Birth/Sex: April 16, 1971 [age] y.o. Male) Treating RN: Curtis Sitesorthy, Joanna Primary Care Physician: PATIENT, NO Other Clinician: Referring Physician: Treating Physician/Extender: Rudene ReBritto, Tuyet Bader Weeks in Treatment: 36 Diagnosis Coding ICD-10 Codes Code Description [phone] Pressure ulcer of left buttock, stage 3 G82.22 Paraplegia, incomplete Facility Procedures CPT4 Code: [id number] Description: 11042 - DEB SUBQ TISSUE 20 SQ CM/< ICD-10 Description Diagnosis L89.323 Pressure ulcer of left buttock, stage 3 G82.22 Paraplegia, incomplete Modifier: Quantity: 1 Physician Procedures CPT4 Code: [id number] Description: 11042 - WC PHYS SUBQ TISS 20 SQ CM ICD-10 Description Diagnosis L89.323 Pressure ulcer of left buttock, stage 3 G82.22 Paraplegia, incomplete Modifier: Quantity: 1 Electronic Signature(s) Signed: 04/27/2016 3:32:59 PM By: Evlyn KannerBritto, Toron Bowring MD, FACS Previous Signature: 04/27/2016 3:26:12 PM Version By: Evlyn KannerBritto, Manfred Laspina MD, FACS Entered By: Evlyn KannerBritto, Mickael Mcnutt  on 04/27/2016 15:32:58

## 2016-04-28 NOTE — Progress Notes (Signed)
CARYL, MANAS ([id number]) Visit Report for 04/27/2016 Arrival Information Details Patient Name: [name], [name] Date of Service: 04/27/2016 2:45 PM Medical Record Number: [id number] Patient Account Number: [id number] Date of Birth/Sex: 07/19/1970 (46 y.o. Male) Treating RN: Huel Coventry Primary Care Physician: PATIENT, NO Other Clinician: Referring Physician: Treating Physician/Extender: Rudene Re in Treatment: 36 Visit Information History Since Last Visit Added or deleted any medications: No Patient Arrived: Ambulatory Any new allergies or adverse reactions: No Arrival Time: 15:06 Had a fall or experienced change in No Accompanied By: self activities of daily living that may affect Transfer Assistance: None risk of falls: Patient Identification Verified: Yes Signs or symptoms of abuse/neglect since last No Secondary Verification Process Yes visito Completed: Hospitalized since last visit: No Patient Requires Transmission-Based No Has Dressing in Place as Prescribed: Yes Precautions: Pain Present Now: No Patient Has Alerts: No Electronic Signature(s) Signed: 04/27/2016 4:15:25 PM By: Elliot Gurney, RN, BSN, Kim RN, BSN Entered By: Elliot Gurney, RN, BSN, Kim on 04/27/2016 15:06:54 [name] ([id number]) -------------------------------------------------------------------------------- Encounter Discharge Information Details Patient Name: [name] Date of Service: 04/27/2016 2:45 PM Medical Record Number: [id number] Patient Account Number: [id number] Date of Birth/Sex: Aug 29, 1970 (46 y.o. Male) Treating RN: Huel Coventry Primary Care Physician: PATIENT, NO Other Clinician: Referring Physician: Treating Physician/Extender: Rudene Re in Treatment: 30 Encounter Discharge Information Items Discharge Pain Level: 0 Discharge Condition: Stable Ambulatory Status: Wheelchair Discharge Destination: Home Private Transportation: Auto Accompanied By: self Schedule  Follow-up Appointment: Yes Medication Reconciliation completed and Yes provided to Patient/Care Nathyn Luiz: Clinical Summary of Care: Electronic Signature(s) Signed: 04/27/2016 4:15:25 PM By: Elliot Gurney, RN, BSN, Kim RN, BSN Entered By: Elliot Gurney, RN, BSN, Kim on 04/27/2016 15:37:59 [name] ([id number]) -------------------------------------------------------------------------------- Multi Wound Chart Details Patient Name: [name] Date of Service: 04/27/2016 2:45 PM Medical Record Number: [id number] Patient Account Number: [id number] Date of Birth/Sex: 07-05-1970 (46 y.o. Male) Treating RN: Huel Coventry Primary Care Physician: PATIENT, NO Other Clinician: Referring Physician: Treating Physician/Extender: Rudene Re in Treatment: 36 Vital Signs Height(in): 67 Pulse(bpm): 93 Weight(lbs): 147 Blood Pressure 144/82 (mmHg): Body Mass Index(BMI): 23 Temperature(F): 97.5 Respiratory Rate 16 (breaths/min): Photos: [1:No Photos] [N/A:N/A] Wound Location: [1:Left Sacrum] [N/A:N/A] Wounding Event: [1:Pressure Injury] [N/A:N/A] Primary Etiology: [1:Pressure Ulcer] [N/A:N/A] Comorbid History: [1:Paraplegia] [N/A:N/A] Date Acquired: [1:12/24/2014] [N/A:N/A] Weeks of Treatment: [1:36] [N/A:N/A] Wound Status: [1:Open] [N/A:N/A] Measurements L x W x D 0.6x0.9x0.2 [N/A:N/A] (cm) Area (cm) : [1:0.424] [N/A:N/A] Volume (cm) : [1:0.085] [N/A:N/A] % Reduction in Area: [1:60.00%] [N/A:N/A] % Reduction in Volume: 84.00% [N/A:N/A] Starting Position 1 10 (o'clock): Ending Position 1 [1:2] (o'clock): Maximum Distance 1 0.3 (cm): Undermining: [1:Yes] [N/A:N/A] Classification: [1:Category/Stage III] [N/A:N/A] Exudate Amount: [1:Large] [N/A:N/A] Exudate Type: [1:Serous] [N/A:N/A] Exudate Color: [1:amber] [N/A:N/A] Wound Margin: [1:Epibole] [N/A:N/A] Granulation Amount: [1:Large (67-100%)] [N/A:N/A] Granulation Quality: [1:Pink, Pale] [N/A:N/A] Necrotic Amount: [1:Small (1-33%)]  [N/A:N/A] Exposed Structures: Fat: Yes N/A N/A Fascia: No Tendon: No Muscle: No Joint: No Bone: No Epithelialization: None N/A N/A Periwound Skin Texture: Callus: Yes N/A N/A Periwound Skin Moist: Yes N/A N/A Moisture: Maceration: No Dry/Scaly: No Periwound Skin Color: No Abnormalities Noted N/A N/A Temperature: No Abnormality N/A N/A Tenderness on No N/A N/A Palpation: Wound Preparation: Ulcer Cleansing: N/A N/A Rinsed/Irrigated with Saline Topical Anesthetic Applied: Other: lidocaine 4% Treatment Notes Electronic Signature(s) Signed: 04/27/2016 4:15:25 PM By: Elliot Gurney, RN, BSN, Kim RN, BSN Entered By: Elliot Gurney, RN, BSN, Kim on 04/27/2016 15:15:21 [name], [name] ([id number]) -------------------------------------------------------------------------------- Multi-Disciplinary Care Plan Details Patient Name: [name] Date of Service: 04/27/2016 2:45 PM Medical Record  Number: [id number] Patient Account Number: [id number] Date of Birth/Sex: 1971-03-10 [age] y.o. Male) Treating RN: Huel CoventryWoody, Kim Primary Care Physician: PATIENT, NO Other Clinician: Referring Physician: Treating Physician/Extender: Rudene ReBritto, Errol Weeks in Treatment: 5236 Active Inactive Abuse / Safety / Falls / Self Care Management Nursing Diagnoses: Potential for falls Goals: Patient will remain injury free Date Initiated: 08/17/2015 Goal Status: Active Interventions: Assess fall risk on admission and as needed Notes: Nutrition Nursing Diagnoses: Imbalanced nutrition Goals: Patient/caregiver agrees to and verbalizes understanding of need to use nutritional supplements and/or vitamins as prescribed Date Initiated: 08/17/2015 Goal Status: Active Interventions: Assess patient nutrition upon admission and as needed per policy Notes: Orientation to the Wound Care Program Nursing Diagnoses: Knowledge deficit related to the wound healing center program Goals: Patient/caregiver will verbalize understanding of  the Wound Healing Center Program Matthew EvangelistHARRIS, [name] ([id number]) Date Initiated: 08/17/2015 Goal Status: Active Interventions: Provide education on orientation to the wound center Notes: Pressure Nursing Diagnoses: Knowledge deficit related to causes and risk factors for pressure ulcer development Goals: Patient will remain free from development of additional pressure ulcers Date Initiated: 08/17/2015 Goal Status: Active Interventions: Assess: immobility, friction, shearing, incontinence upon admission and as needed Notes: Wound/Skin Impairment Nursing Diagnoses: Impaired tissue integrity Goals: Ulcer/skin breakdown will have a volume reduction of 30% by week 4 Date Initiated: 08/17/2015 Goal Status: Active Ulcer/skin breakdown will have a volume reduction of 50% by week 8 Date Initiated: 08/17/2015 Goal Status: Active Ulcer/skin breakdown will have a volume reduction of 80% by week 12 Date Initiated: 08/17/2015 Goal Status: Active Interventions: Assess patient/caregiver ability to obtain necessary supplies Assess ulceration(s) every visit Notes: Electronic Signature(s) Signed: 04/27/2016 4:15:25 PM By: Elliot GurneyWoody, RN, BSN, Kim RN, BSN [place], Matthew CreekHAR[place] ([id number]) Entered By: Elliot GurneyWoody, RN, BSN, Kim on 04/27/2016 15:15:15 Matthew EvangelistHARRIS, [name] ([id number]) -------------------------------------------------------------------------------- Pain Assessment Details Patient Name: Matthew EvangelistHARRIS, [name] Date of Service: 04/27/2016 2:45 PM Medical Record Number: [id number] Patient Account Number: [id number] Date of Birth/Sex: 1971-03-10 [age] y.o. Male) Treating RN: Huel CoventryWoody, Kim Primary Care Physician: PATIENT, NO Other Clinician: Referring Physician: Treating Physician/Extender: Rudene ReBritto, Errol Weeks in Treatment: 36 Active Problems Location of Pain Severity and Description of Pain Patient Has Paino No Site Locations With Dressing Change: No Pain Management and Medication Current Pain Management: Notes Topical  or injectable lidocaine is offered to patient for acute pain when surgical debridement is performed. If needed, Patient is instructed to use over the counter pain medication for the following 24-48 hours after debridement. Wound care MDs do not prescribed pain medications. Patient has chronic pain or uncontrolled pain. Patient has been instructed to make an appointment with their Primary Care Physician for pain management. Electronic Signature(s) Signed: 04/27/2016 4:15:25 PM By: Elliot GurneyWoody, RN, BSN, Kim RN, BSN Entered By: Elliot GurneyWoody, RN, BSN, Kim on 04/27/2016 15:07:26 Matthew EvangelistHARRIS, [name] ([id number]) -------------------------------------------------------------------------------- Patient/Caregiver Education Details Patient Name: Matthew EvangelistHARRIS, [name] Date of Service: 04/27/2016 2:45 PM Medical Record Number: [id number] Patient Account Number: [id number] Date of Birth/Gender: 1971-03-10 [age] y.o. Male) Treating RN: Huel CoventryWoody, Kim Primary Care Physician: PATIENT, NO Other Clinician: Referring Physician: Treating Physician/Extender: Rudene ReBritto, Errol Weeks in Treatment: 3236 Education Assessment Education Provided To: Caregiver Education Topics Provided Wound/Skin Impairment: Handouts: Caring for Your Ulcer, Other: coutinue wound care as prescribed Methods: Demonstration, Explain/Verbal Responses: State content correctly Electronic Signature(s) Signed: 04/27/2016 4:15:25 PM By: Elliot GurneyWoody, RN, BSN, Kim RN, BSN Entered By: Elliot GurneyWoody, RN, BSN, Kim on 04/27/2016 15:38:33 Matthew EvangelistHARRIS, [name] ([id number]) -------------------------------------------------------------------------------- Wound Assessment Details Patient Name: Matthew EvangelistHARRIS, [name] Date of Service: 04/27/2016 2:45 PM Medical Record Number: [id number] Patient  Account Number: [id number] Date of Birth/Sex: Aug 02, 1970 (46 y.o. Male) Treating RN: Huel Coventry Primary Care Physician: PATIENT, NO Other Clinician: Referring Physician: Treating Physician/Extender: Rudene Re in  Treatment: 36 Wound Status Wound Number: 1 Primary Etiology: Pressure Ulcer Wound Location: Left Sacrum Wound Status: Open Wounding Event: Pressure Injury Comorbid History: Paraplegia Date Acquired: 12/24/2014 Weeks Of Treatment: 36 Clustered Wound: No Photos Photo Uploaded By: Elliot Gurney, RN, BSN, Kim on 04/27/2016 16:11:35 Wound Measurements Length: (cm) 0.6 % Reduction in Width: (cm) 0.9 % Reduction in Depth: (cm) 0.2 Epithelializat Area: (cm) 0.424 Tunneling: Volume: (cm) 0.085 Undermining: Starting Po Ending Posi Maximum Dis Area: 60% Volume: 84% ion: None No Yes sition (o'clock): 10 tion (o'clock): 2 tance: (cm) 0.3 Wound Description Classification: Category/Stage III Foul Odor Afte Wound Margin: Epibole Exudate Amount: Large Exudate Type: Serous Exudate Color: amber r Cleansing: No Wound Bed Granulation Amount: Large (67-100%) Exposed Structure Granulation Quality: Pink, Pale Fascia Exposed: No Necrotic Amount: Small (1-33%) Fat Layer Exposed: Yes [name], [name] ([id number]) Necrotic Quality: Adherent Slough Tendon Exposed: No Muscle Exposed: No Joint Exposed: No Bone Exposed: No Periwound Skin Texture Texture Color No Abnormalities Noted: No No Abnormalities Noted: No Callus: Yes Temperature / Pain Moisture Temperature: No Abnormality No Abnormalities Noted: No Dry / Scaly: No Maceration: No Moist: Yes Wound Preparation Ulcer Cleansing: Rinsed/Irrigated with Saline Topical Anesthetic Applied: Other: lidocaine 4%, Treatment Notes Wound #1 (Left Sacrum) 1. Cleansed with: Clean wound with Normal Saline 2. Anesthetic Topical Lidocaine 4% cream to wound bed prior to debridement 3. Peri-wound Care: Skin Prep 4. Dressing Applied: Prisma Ag 5. Secondary Dressing Applied [employer]) Signed: 04/27/2016 4:15:25 PM By: Elliot Gurney, RN, BSN, Kim RN, BSN Entered By: Elliot Gurney, RN, BSN, Kim on 04/27/2016 15:12:33 [name], PARLETT  ([id number]) -------------------------------------------------------------------------------- Vitals Details Patient Name: [name] Date of Service: 04/27/2016 2:45 PM Medical Record Number: [id number] Patient Account Number: [id number] Date of Birth/Sex: 01/06/1971 (46 y.o. Male) Treating RN: Huel Coventry Primary Care Physician: PATIENT, NO Other Clinician: Referring Physician: Treating Physician/Extender: Rudene Re in Treatment: 36 Vital Signs Time Taken: 15:07 Temperature (F): 97.5 Height (in): 67 Pulse (bpm): 93 Weight (lbs): 147 Respiratory Rate (breaths/min): 16 Body Mass Index (BMI): 23 Blood Pressure (mmHg): 144/82 Reference Range: 80 - 120 mg / dl Electronic Signature(s) Signed: 04/27/2016 4:15:25 PM By: Elliot Gurney, RN, BSN, Kim RN, BSN Entered By: Elliot Gurney, RN, BSN, Kim on 04/27/2016 15:07:50

## 2016-04-30 ENCOUNTER — Other Ambulatory Visit: Payer: Self-pay | Admitting: Physical Medicine & Rehabilitation

## 2016-05-03 ENCOUNTER — Ambulatory Visit (INDEPENDENT_AMBULATORY_CARE_PROVIDER_SITE_OTHER): Payer: Medicare Other | Admitting: Psychology

## 2016-05-03 DIAGNOSIS — F3132 Bipolar disorder, current episode depressed, moderate: Secondary | ICD-10-CM

## 2016-05-04 ENCOUNTER — Ambulatory Visit: Payer: Self-pay | Admitting: Surgery

## 2016-05-08 ENCOUNTER — Encounter: Payer: Medicare Other | Admitting: Surgery

## 2016-05-08 DIAGNOSIS — L89323 Pressure ulcer of left buttock, stage 3: Secondary | ICD-10-CM | POA: Diagnosis not present

## 2016-05-08 DIAGNOSIS — L89153 Pressure ulcer of sacral region, stage 3: Secondary | ICD-10-CM | POA: Diagnosis not present

## 2016-05-08 DIAGNOSIS — G8222 Paraplegia, incomplete: Secondary | ICD-10-CM | POA: Diagnosis not present

## 2016-05-09 NOTE — Progress Notes (Signed)
[name], Sharon ([id number]) Visit Report for 05/08/2016 Arrival Information Details Patient Name: [name], Commodore Date of Service: 05/08/2016 1:30 PM Medical Record Number: [id number] Patient Account Number: [id number] Date of Birth/Sex: 09-Feb-1971 [age] y.o. Male) Treating RN: Phillis HaggisPinkerton, Debi Primary Care Physician: PATIENT, NO Other Clinician: Referring Physician: Treating Physician/Extender: Rudene ReBritto, Errol Weeks in Treatment: 37 Visit Information History Since Last Visit All ordered tests and consults were completed: No Patient Arrived: Wheel Chair Added or deleted any medications: No Arrival Time: 13:53 Any new allergies or adverse reactions: No Accompanied By: self Had a fall or experienced change in No activities of daily living that may affect Transfer Assistance: Other risk of falls: Patient Identification Verified: Yes Signs or symptoms of abuse/neglect since last No Secondary Verification Process Yes visito Completed: Hospitalized since last visit: No Patient Requires Transmission-Based No Has Dressing in Place as Prescribed: Yes Precautions: Pain Present Now: No Patient Has Alerts: No Electronic Signature(s) Signed: 05/09/2016 10:45:15 AM By: Alejandro MullingPinkerton, Debra Entered By: Alejandro MullingPinkerton, Debra on 05/08/2016 13:53:23 [name], Aldwin ([id number]) -------------------------------------------------------------------------------- Encounter Discharge Information Details Patient Name: [name], Xzayvier Date of Service: 05/08/2016 1:30 PM Medical Record Number: [id number] Patient Account Number: [id number] Date of Birth/Sex: 09-Feb-1971 [age] y.o. Male) Treating RN: Phillis HaggisPinkerton, Debi Primary Care Physician: PATIENT, NO Other Clinician: Referring Physician: Treating Physician/Extender: Rudene ReBritto, Errol Weeks in Treatment: 37 Encounter Discharge Information Items Discharge Pain Level: 0 Discharge Condition: Stable Ambulatory Status: Wheelchair Discharge Destination:  Home Private Transportation: Auto Accompanied By: self Schedule Follow-up Appointment: Yes Medication Reconciliation completed and Yes provided to Patient/Care Eternity Dexter: Clinical Summary of Care: Electronic Signature(s) Signed: 05/09/2016 10:45:15 AM By: Alejandro MullingPinkerton, Debra Entered By: Alejandro MullingPinkerton, Debra on 05/08/2016 14:18:47 [name], Eileen ([id number]) -------------------------------------------------------------------------------- Lower Extremity Assessment Details Patient Name: [name], Cedrick Date of Service: 05/08/2016 1:30 PM Medical Record Number: [id number] Patient Account Number: [id number] Date of Birth/Sex: 09-Feb-1971 [age] y.o. Male) Treating RN: Phillis HaggisPinkerton, Debi Primary Care Physician: PATIENT, NO Other Clinician: Referring Physician: Treating Physician/Extender: Rudene ReBritto, Errol Weeks in Treatment: 37 Electronic Signature(s) Signed: 05/09/2016 10:45:15 AM By: Alejandro MullingPinkerton, Debra Entered By: Alejandro MullingPinkerton, Debra on 05/08/2016 14:03:44 [name], Haston ([id number]) -------------------------------------------------------------------------------- Multi Wound Chart Details Patient Name: [name], Pinkney Date of Service: 05/08/2016 1:30 PM Medical Record Number: [id number] Patient Account Number: [id number] Date of Birth/Sex: 09-Feb-1971 [age] y.o. Male) Treating RN: Phillis HaggisPinkerton, Debi Primary Care Physician: PATIENT, NO Other Clinician: Referring Physician: Treating Physician/Extender: Rudene ReBritto, Errol Weeks in Treatment: 37 Vital Signs Height(in): 67 Pulse(bpm): 74 Weight(lbs): 147 Blood Pressure 142/78 (mmHg): Body Mass Index(BMI): 23 Temperature(F): 97.5 Respiratory Rate 16 (breaths/min): Photos: [1:No Photos] [N/A:N/A] Wound Location: [1:Left Sacrum] [N/A:N/A] Wounding Event: [1:Pressure Injury] [N/A:N/A] Primary Etiology: [1:Pressure Ulcer] [N/A:N/A] Comorbid History: [1:Paraplegia] [N/A:N/A] Date Acquired: [1:12/24/2014] [N/A:N/A] Weeks of Treatment: [1:37] [N/A:N/A] Wound  Status: [1:Open] [N/A:N/A] Measurements L x W x D 0.3x0.9x0.4 [N/A:N/A] (cm) Area (cm) : [1:0.212] [N/A:N/A] Volume (cm) : [1:0.085] [N/A:N/A] % Reduction in Area: [1:80.00%] [N/A:N/A] % Reduction in Volume: 84.00% [N/A:N/A] Classification: [1:Category/Stage III] [N/A:N/A] Exudate Amount: [1:Large] [N/A:N/A] Exudate Type: [1:Serous] [N/A:N/A] Exudate Color: [1:amber] [N/A:N/A] Wound Margin: [1:Epibole] [N/A:N/A] Granulation Amount: [1:Large (67-100%)] [N/A:N/A] Granulation Quality: [1:Pink, Pale] [N/A:N/A] Necrotic Amount: [1:Small (1-33%)] [N/A:N/A] Necrotic Tissue: [1:Eschar, Adherent Slough] [N/A:N/A] Exposed Structures: [1:Fat: Yes Fascia: No Tendon: No Muscle: No Joint: No Bone: No] [N/A:N/A] Epithelialization: [1:None] [N/A:N/A] Debridement: Debridement ([id number](11042- N/A N/A 11047) Pre-procedure 14:04 N/A N/A Verification/Time Out Taken: Pain Control: Lidocaine 4% Topical N/A N/A Solution Tissue Debrided: Fibrin/Slough, Exudates, N/A N/A Subcutaneous Level: Skin/Subcutaneous N/A N/A Tissue Debridement Area (sq 0.27 N/A N/A cm): Instrument: Curette N/A  N/A Bleeding: Minimum N/A N/A Hemostasis Achieved: Pressure N/A N/A Procedural Pain: 0 N/A N/A Post Procedural Pain: 0 N/A N/A Debridement Treatment Procedure was tolerated N/A N/A Response: well Post Debridement 0.3x0.9x0.3 N/A N/A Measurements L x W x D (cm) Post Debridement 0.064 N/A N/A Volume: (cm) Post Debridement Category/Stage III N/A N/A Stage: Periwound Skin Texture: Callus: Yes N/A N/A Periwound Skin Moist: Yes N/A N/A Moisture: Maceration: No Dry/Scaly: No Periwound Skin Color: No Abnormalities Noted N/A N/A Temperature: No Abnormality N/A N/A Tenderness on No N/A N/A Palpation: Wound Preparation: Ulcer Cleansing: N/A N/A Rinsed/Irrigated with Saline Topical Anesthetic Applied: Other: lidocaine 4% Procedures Performed: Debridement N/A N/A Treatment Notes Electronic Signature(s) Signed:  05/08/2016 2:14:33 PM By: Evlyn Kanner MD, FACS [place], [place] ([id number]) Entered By: Evlyn Kanner on 05/08/2016 14:14:33 Matthew Evangelist ([id number]) -------------------------------------------------------------------------------- Multi-Disciplinary Care Plan Details Patient Name: Matthew Evangelist Date of Service: 05/08/2016 1:30 PM Medical Record Number: [id number] Patient Account Number: [id number] Date of Birth/Sex: [birth year] (46 y.o. Male) Treating RN: Phillis Haggis Primary Care Physician: PATIENT, NO Other Clinician: Referring Physician: Treating Physician/Extender: Rudene Re in Treatment: 107 Active Inactive Abuse / Safety / Falls / Self Care Management Nursing Diagnoses: Potential for falls Goals: Patient will remain injury free Date Initiated: 08/17/2015 Goal Status: Active Interventions: Assess fall risk on admission and as needed Notes: Nutrition Nursing Diagnoses: Imbalanced nutrition Goals: Patient/caregiver agrees to and verbalizes understanding of need to use nutritional supplements and/or vitamins as prescribed Date Initiated: 08/17/2015 Goal Status: Active Interventions: Assess patient nutrition upon admission and as needed per policy Notes: Orientation to the Wound Care Program Nursing Diagnoses: Knowledge deficit related to the wound healing center program Goals: Patient/caregiver will verbalize understanding of the Wound Healing Center Program EXANDER, SHAUL ([id number]) Date Initiated: 08/17/2015 Goal Status: Active Interventions: Provide education on orientation to the wound center Notes: Pressure Nursing Diagnoses: Knowledge deficit related to causes and risk factors for pressure ulcer development Goals: Patient will remain free from development of additional pressure ulcers Date Initiated: 08/17/2015 Goal Status: Active Interventions: Assess: immobility, friction, shearing, incontinence upon admission and as  needed Notes: Wound/Skin Impairment Nursing Diagnoses: Impaired tissue integrity Goals: Ulcer/skin breakdown will have a volume reduction of 30% by week 4 Date Initiated: 08/17/2015 Goal Status: Active Ulcer/skin breakdown will have a volume reduction of 50% by week 8 Date Initiated: 08/17/2015 Goal Status: Active Ulcer/skin breakdown will have a volume reduction of 80% by week 12 Date Initiated: 08/17/2015 Goal Status: Active Interventions: Assess patient/caregiver ability to obtain necessary supplies Assess ulceration(s) every visit Notes: Electronic Signature(s) Signed: 05/09/2016 10:45:15 AM By: Servando Snare, Leonette Most ([id number]) Entered By: Alejandro Mulling on 05/08/2016 14:04:00 Matthew Evangelist ([id number]) -------------------------------------------------------------------------------- Pain Assessment Details Patient Name: Matthew Evangelist Date of Service: 05/08/2016 1:30 PM Medical Record Number: [id number] Patient Account Number: [id number] Date of Birth/Sex: Apr 11, 1971 (46 y.o. Male) Treating RN: Phillis Haggis Primary Care Physician: PATIENT, NO Other Clinician: Referring Physician: Treating Physician/Extender: Rudene Re in Treatment: 37 Active Problems Location of Pain Severity and Description of Pain Patient Has Paino No Site Locations With Dressing Change: No Pain Management and Medication Current Pain Management: Electronic Signature(s) Signed: 05/09/2016 10:45:15 AM By: Alejandro Mulling Entered By: Alejandro Mulling on 05/08/2016 13:53:31 Matthew Evangelist ([id number]) -------------------------------------------------------------------------------- Patient/Caregiver Education Details Patient Name: Matthew Evangelist Date of Service: 05/08/2016 1:30 PM Medical Record Number: [id number] Patient Account Number: [id number] Date of Birth/Gender: 29-Jul-1970 (46 y.o. Male) Treating RN: Phillis Haggis Primary Care Physician: PATIENT, NO Other  Clinician: Referring Physician: Treating Physician/Extender: Meyer Russel,  Cecilie Kicks in Treatment: 37 Education Assessment Education Provided To: Patient Education Topics Provided Wound/Skin Impairment: Handouts: Other: change dressing as ordered Methods: Demonstration, Explain/Verbal Responses: State content correctly Electronic Signature(s) Signed: 05/09/2016 10:45:15 AM By: Alejandro Mulling Entered By: Alejandro Mulling on 05/08/2016 14:19:02 Matthew Evangelist ([id number]) -------------------------------------------------------------------------------- Wound Assessment Details Patient Name: Matthew Evangelist Date of Service: 05/08/2016 1:30 PM Medical Record Number: [id number] Patient Account Number: [id number] Date of Birth/Sex: Nov 08, 1970 (46 y.o. Male) Treating RN: Phillis Haggis Primary Care Physician: PATIENT, NO Other Clinician: Referring Physician: Treating Physician/Extender: Rudene Re in Treatment: 37 Wound Status Wound Number: 1 Primary Etiology: Pressure Ulcer Wound Location: Left Sacrum Wound Status: Open Wounding Event: Pressure Injury Comorbid History: Paraplegia Date Acquired: 12/24/2014 Weeks Of Treatment: 37 Clustered Wound: No Photos Photo Uploaded By: Alejandro Mulling on 05/08/2016 14:22:18 Wound Measurements Length: (cm) 0.3 Width: (cm) 0.9 Depth: (cm) 0.4 Area: (cm) 0.212 Volume: (cm) 0.085 % Reduction in Area: 80% % Reduction in Volume: 84% Epithelialization: None Tunneling: No Undermining: No Wound Description Classification: Category/Stage III Foul Odor Afte Wound Margin: Epibole Exudate Amount: Large Exudate Type: Serous Exudate Color: amber r Cleansing: No Wound Bed Granulation Amount: Large (67-100%) Exposed Structure Granulation Quality: Pink, Pale Fascia Exposed: No Necrotic Amount: Small (1-33%) Fat Layer Exposed: Yes Necrotic Quality: Eschar, Adherent Slough Tendon Exposed: No BRELAN, HANNEN ([id number]) Muscle  Exposed: No Joint Exposed: No Bone Exposed: No Periwound Skin Texture Texture Color No Abnormalities Noted: No No Abnormalities Noted: No Callus: Yes Temperature / Pain Moisture Temperature: No Abnormality No Abnormalities Noted: No Dry / Scaly: No Maceration: No Moist: Yes Wound Preparation Ulcer Cleansing: Rinsed/Irrigated with Saline Topical Anesthetic Applied: Other: lidocaine 4%, Treatment Notes Wound #1 (Left Sacrum) 1. Cleansed with: Clean wound with Normal Saline 2. Anesthetic Topical Lidocaine 4% cream to wound bed prior to debridement 3. Peri-wound Care: Skin Prep 4. Dressing Applied: Other dressing (specify in notes) 5. Secondary Dressing Applied Foam Tegaderm Notes endoform Electronic Signature(s) Signed: 05/09/2016 10:45:15 AM By: Alejandro Mulling Entered By: Alejandro Mulling on 05/08/2016 14:01:42 Matthew Evangelist ([id number]) -------------------------------------------------------------------------------- Vitals Details Patient Name: Matthew Evangelist Date of Service: 05/08/2016 1:30 PM Medical Record Number: [id number] Patient Account Number: [id number] Date of Birth/Sex: 02/19/1971 (46 y.o. Male) Treating RN: Phillis Haggis Primary Care Physician: PATIENT, NO Other Clinician: Referring Physician: Treating Physician/Extender: Rudene Re in Treatment: 37 Vital Signs Time Taken: 13:53 Temperature (F): 97.5 Height (in): 67 Pulse (bpm): 74 Weight (lbs): 147 Respiratory Rate (breaths/min): 16 Body Mass Index (BMI): 23 Blood Pressure (mmHg): 142/78 Reference Range: 80 - 120 mg / dl Electronic Signature(s) Signed: 05/09/2016 10:45:15 AM By: Alejandro Mulling Entered By: Alejandro Mulling on 05/08/2016 13:55:15

## 2016-05-09 NOTE — Progress Notes (Signed)
[name], [name] ([id number]) Visit Report for 05/08/2016 Chief Complaint Document Details Patient Name: [name], [name] Date of Service: 05/08/2016 1:30 PM Medical Record Number: [id number] Patient Account Number: [id number] Date of Birth/Sex: [birth year] (46 y.o. Male) Treating RN: Phillis Haggis Primary Care Physician: PATIENT, NO Other Clinician: Referring Physician: Treating Physician/Extender: Rudene Re in Treatment: 37 Information Obtained from: Patient Chief Complaint Mr. [name] presents today, after missing last week's appointment, for follow-up on his left buttock stage III pressure ulcer. Electronic Signature(s) Signed: 05/08/2016 2:14:50 PM By: Evlyn Kanner MD, FACS Entered By: Evlyn Kanner on 05/08/2016 14:14:49 [name] ([id number]) -------------------------------------------------------------------------------- Debridement Details Patient Name: [name] Date of Service: 05/08/2016 1:30 PM Medical Record Number: [id number] Patient Account Number: [id number] Date of Birth/Sex: 1970/12/31 (46 y.o. Male) Treating RN: Phillis Haggis Primary Care Physician: PATIENT, NO Other Clinician: Referring Physician: Treating Physician/Extender: Rudene Re in Treatment: 37 Debridement Performed for Wound #1 Left Sacrum Assessment: Performed By: Physician Evlyn Kanner, MD Debridement: Debridement Pre-procedure Yes - 14:04 Verification/Time Out Taken: Start Time: 14:05 Pain Control: Lidocaine 4% Topical Solution Level: Skin/Subcutaneous Tissue Total Area Debrided (L x 0.3 (cm) x 0.9 (cm) = 0.27 (cm) W): Tissue and other Viable, Non-Viable, Exudate, Fibrin/Slough, Subcutaneous material debrided: Instrument: Curette Bleeding: Minimum Hemostasis Achieved: Pressure End Time: 14:08 Procedural Pain: 0 Post Procedural Pain: 0 Response to Treatment: Procedure was tolerated well Post Debridement Measurements of Total Wound Length: (cm) 0.3 Stage:  Category/Stage III Width: (cm) 0.9 Depth: (cm) 0.3 Volume: (cm) 0.064 Character of Wound/Ulcer Post Requires Further Debridement: Debridement Severity of Tissue Post Fat layer exposed Debridement: Post Procedure Diagnosis Same as Pre-procedure Electronic Signature(s) Signed: 05/08/2016 2:14:44 PM By: Evlyn Kanner MD, FACS Signed: 05/09/2016 10:45:15 AM By: Alejandro Mulling Entered By: Evlyn Kanner on 05/08/2016 14:14:44 [name], [name] ([id number]) [name], [name] ([id number]) -------------------------------------------------------------------------------- HPI Details Patient Name: [name] Date of Service: 05/08/2016 1:30 PM Medical Record Number: [id number] Patient Account Number: [id number] Date of Birth/Sex: February 09, 1971 (47 y.o. 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 He was diagnosed with what sounds like a stage III pressure ulcer in September 2016 although this may have been present for some time longer than that perhaps July 2016. He was followed by wound care in [place]. By his description there were using Silvadene cream for a period of time and then 3 months ago change to Aquacel Ag which she is continued and changes daily with help from his mother. He has recently relocated to [facility]. He went to urgent care to follow-up on his wound on 08/04/2015 they did a culture of this wound that showed Pseudomonas and MSSA. He was placed on an antibiotic but doesn't remember which one it is. He has continued using Aquacel Ag and a foam based cover. I don't have any of his records from the wound care center in [place] although the patient states he  did have an MRI that did not show osteomyelitis. He is not aware of having a bone culture done and doesn't think that he had exposed bone on this and any point. The patient does in and out cath's and has no urinary incontinence issues. He is on appropriate bowel regimen which she administers suppositories. He is able to position himself appropriately and  is wheelchair to try and avoid pressure over this wound 08/24/15; small horizontal wound over his lower sacral area. Requires debridement of surface slough and nonviable subcutaneous tissue. There is a small amount of undermining noted no evidence of infection. On the right side of this wound there is some senescent rolled edges which may need debridement I did not do this today. 09/01/15 this is a small wound over the lower sacral area. It has tunneling superiorly. This goes roughly 1 cm. Nonviable subcutaneous tissue at the inferior aspect of the wound base. No real evidence of infection 09/07/15; small horizontal wound over the lower sacral area. Last week it had 1 cm of superior tunneling which appears to of resolved. No evidence of infection 09/21/15; continues to have a small horizontal wound over the lower sacral area. We have been using Prisma and this has contracted. He tells me that his mother is aware medication not able to change the dressing although he states that he is able to do this 09/28/15; a small horizontal wound over his lower sacral area is all that is left of this wound which was probably much larger and deeper at one point. Depth today at 0.7 cm. Base of this had some nonviable subcutaneous tissue that I removed. Appears to be stalling on current dressing which is Prisma 10/19/15; I have not seen this patient in several weeks. He arrives today with a linear/horizontal wound across his lower sacral area. Considerable depth of this wound. Extensive debridement done 11/17/15; patient arrives for two-week visit. He is not doing well  wound is bigger with odor and drainage. Has been using Aquacel Ag. I have not seen this in over a month although I think he was seen by the physician covering me 2 weeks ago on 11/05/15 11/24/15; I brought this man back this week to review the wound after last week's deterioration. Culture grew Enterobacter and some staph aureus [MSSA]. I had ordered him Cipro but we don't seem to of been able to communicate with the patient therefore we've called in today. He is not been systemically unwell. Still using Aquacel 12/15/15; very little change in this wound since the last time I saw it. Horizontal wound over the lower sacral Mceachin, Chiron ([id number]) area probably about 2 cm in width and 0.8 cm in depth. There is no evidence of infection. There appears to be callus around this. In spite of the patient's best efforts I think there is an adequate pressure relief here. No current evidence of infection 12/22/15; horizontal pressure ulcer over the lower sacral area. Appears to be filling in and has better dimensions in appearance today. Still circumferential callus which would suggest inadequate offloading although everything else appears to be improved 12/29/15 horizontal pressure wound over the lower sacral area. Appears to be filling in however skin groin down into the divot. No evidence of infection 01/05/16; comes in today with not much change in this wound. There is surrounding callus indicative of pressure. Not much change in the wound bed. This clearly require debridement today we have been using Hydrofera Blue 02/04/16: returns today after an absence. no systemic s/s of infection. once again, periwound callus is present suggesting pressure. 02/18/2016 -- she was away for 2 weeks traveling to [place] and during that time he had a new pressure injury to the left ischial tuberosity which he thinks may have been caused while transferring. 03-23-2016; [name] returns today, after missing last week's  appointment, for evaluation of his left buttock stage  III pressure ulcer. There is no ulceration to the left ischial tuberosity at this appointment. He states that he has been changing the dressing to his ulcer daily due to daily showering. He voices no complaints or concerns and denies benign antibiotic therapy for any reason at this point. 03/30/2016 -- the patient has been doing fine and after debridement of his wound today I believe is ready for a skin substitute and he has been previously approved for grafix and we will try and obtain this from next week. 04/06/2016 -- the patient's insurance was run again and he had $275 copayment and he has declined the skin substitute. 12/20/17opatient arrived on the wrong day and as he drives from [place] we fit him in the scheduled for review. 05/08/2016 -- I have not seen him for the last 2 weeks but he is doing his dressing diligently and says everything else is doing fine Electronic Signature(s) Signed: 05/08/2016 2:15:22 PM By: Evlyn Kanner MD, FACS Entered By: Evlyn Kanner on 05/08/2016 14:15:21 [name], [name] ([id number]) -------------------------------------------------------------------------------- Physical Exam Details Patient Name: [name] Date of Service: 05/08/2016 1:30 PM Medical Record Number: [id number] Patient Account Number: [id number] Date of Birth/Sex: 07-Jun-1970 (46 y.o. Male) Treating RN: Phillis Haggis Primary Care Physician: PATIENT, NO Other Clinician: Referring Physician: Treating Physician/Extender: Rudene Re in Treatment: 37 Constitutional . Pulse regular. Respirations normal and unlabored. Afebrile. . Eyes Nonicteric. Reactive to light. Ears, Nose, Mouth, and Throat Lips, teeth, and gums WNL.Marland Kitchen Moist mucosa without lesions. Neck supple and nontender. No palpable supraclavicular or cervical adenopathy. Normal sized without goiter. Respiratory WNL. No retractions.. Breath sounds WNL, No  rubs, rales, rhonchi, or wheeze.. Cardiovascular Heart rhythm and rate regular, no murmur or gallop.. Pedal Pulses WNL. No clubbing, cyanosis or edema. Chest Breasts symmetical and no nipple discharge.. Breast tissue WNL, no masses, lumps, or tenderness.. Lymphatic No adneopathy. No adenopathy. No adenopathy. Musculoskeletal Adexa without tenderness or enlargement.. Digits and nails w/o clubbing, cyanosis, infection, petechiae, ischemia, or inflammatory conditions.. Integumentary (Hair, Skin) No suspicious lesions. No crepitus or fluctuance. No peri-wound warmth or erythema. No masses.Marland Kitchen Psychiatric Judgement and insight Intact.. No evidence of depression, anxiety, or agitation.. Notes the wound had built up quite a bit of eschar and subcutaneous debris and had sharply removed this with a #3 curet and bleeding controlled with pressure Electronic Signature(s) Signed: 05/08/2016 2:15:45 PM By: Evlyn Kanner MD, FACS Entered By: Evlyn Kanner on 05/08/2016 14:15:45 [name] ([id number]) -------------------------------------------------------------------------------- Physician Orders Details Patient Name: [name] Date of Service: 05/08/2016 1:30 PM Medical Record Number: [id number] Patient Account Number: [id number] Date of Birth/Sex: 1971/03/22 (46 y.o. Male) Treating RN: Phillis Haggis Primary Care Physician: PATIENT, NO Other Clinician: Referring Physician: Treating Physician/Extender: Rudene Re in Treatment: 46 Verbal / Phone Orders: Yes Clinician: Pinkerton, Debi Read Back and Verified: Yes Diagnosis Coding Wound Cleansing Wound #1 Left Sacrum o Clean wound with Normal Saline. Anesthetic Wound #1 Left Sacrum o Topical Lidocaine 4% cream applied to wound bed prior to debridement Skin Barriers/Peri-Wound Care Wound #1 Left Sacrum o Barrier cream Primary Wound Dressing Wound #1 Left Sacrum o Other: - endoform Secondary Dressing Wound #1 Left  Sacrum o Foam o Tegaderm Dressing Change Frequency Wound #1 Left Sacrum o Other: - twice weekly Follow-up Appointments Wound #1 Left Sacrum o Return Appointment in 1 week. Off-Loading Wound #1 Left Sacrum o Roho cushion for wheelchair o Turn and reposition every 2 hours [name], [name] ([id number]) Electronic Signature(s) Signed: 05/08/2016 3:40:08 PM By: Evlyn Kanner MD,  FACS Signed: 05/09/2016 10:45:15 AM By: Alejandro Mulling Entered By: Alejandro Mulling on 05/08/2016 14:08:04 [name] ([id number]) -------------------------------------------------------------------------------- Problem List Details Patient Name: [name] Date of Service: 05/08/2016 1:30 PM Medical Record Number: [id number] Patient Account Number: [id number] Date of Birth/Sex: 05/28/1970 (46 y.o. Male) Treating RN: Phillis Haggis Primary Care Physician: PATIENT, NO Other Clinician: Referring Physician: Treating Physician/Extender: Rudene Re in Treatment: 37 Active Problems ICD-10 Encounter Code Description Active Date Diagnosis L89.323 Pressure ulcer of left buttock, stage 3 03/23/2016 Yes G82.22 Paraplegia, incomplete 08/17/2015 Yes Inactive Problems Resolved Problems ICD-10 Code Description Active Date Resolved Date L89.322 Pressure ulcer of left buttock, stage 2 02/18/2016 02/18/2016 L89.153 Pressure ulcer of sacral region, stage 3 08/17/2015 08/17/2015 Electronic Signature(s) Signed: 05/08/2016 2:14:29 PM By: Evlyn Kanner MD, FACS Entered By: Evlyn Kanner on 05/08/2016 14:14:28 [name] ([id number]) -------------------------------------------------------------------------------- Progress Note Details Patient Name: [name] Date of Service: 05/08/2016 1:30 PM Medical Record Number: [id number] Patient Account Number: [id number] Date of Birth/Sex: 1971-01-22 (46 y.o. Male) Treating RN: Phillis Haggis Primary Care Physician: PATIENT, NO Other  Clinician: Referring Physician: Treating Physician/Extender: Rudene Re in Treatment: 37 Subjective Chief Complaint Information obtained from Patient Mr. Fonder presents today, after missing last week's appointment, for follow-up on his left buttock stage III pressure ulcer. 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 He was diagnosed with what sounds like a stage III pressure ulcer in September 2016 although this may have been present for some time longer than that perhaps July 2016. He was followed by wound care in [place]. By his description there were using Silvadene cream for a period of time and then 3 months ago change to Aquacel Ag which she is continued and changes daily with help from his mother. He has recently relocated to [facility]. He went to urgent care to follow- up on his wound on 08/04/2015 they did a culture of this wound that showed Pseudomonas and MSSA. He was placed on an antibiotic but doesn't remember which one it is. He has continued using Aquacel Ag and a foam based cover. I don't have any of his records from the wound care center in [place] although the patient states he did have an MRI that did not show osteomyelitis. He is not aware of having a bone culture done and doesn't think that he had exposed bone on this and any point. The patient does in and out cath's and has no urinary incontinence issues. He is on appropriate bowel regimen which she administers suppositories. He is able to position himself appropriately and is wheelchair to try and avoid pressure over this wound 08/24/15; small horizontal wound over his lower sacral area. Requires debridement of surface slough and nonviable subcutaneous tissue. There is a small amount of undermining noted no evidence of infection. On the right side of this wound there is  some senescent rolled edges which may need debridement I did not do this today. 09/01/15 this is a small wound over the lower sacral area. It has tunneling superiorly. This goes roughly 1 cm. Nonviable subcutaneous tissue at the inferior aspect of the wound base. No real evidence of infection 09/07/15; small horizontal wound over the lower sacral area. Last week it had 1 cm of superior tunneling which appears to of resolved. No evidence of infection 09/21/15; continues to have a small horizontal wound over the lower sacral  area. We have been using Prisma and this has contracted. He tells me that his mother is aware medication not able to change the dressing although he states that he is able to do this 09/28/15; a small horizontal wound over his lower sacral area is all that is left of this wound which was probably much larger and deeper at one point. Depth today at 0.7 cm. Base of this had some nonviable subcutaneous tissue that I removed. Appears to be stalling on current dressing which is Prisma 10/19/15; I have not seen this patient in several weeks. He arrives today with a linear/horizontal wound across his lower sacral area. Considerable depth of this wound. Extensive debridement done 11/17/15; patient arrives for two-week visit. He is not doing well wound is bigger with odor and drainage. Has [name], [name] ([id number]) been using Aquacel Ag. I have not seen this in over a month although I think he was seen by the physician covering me 2 weeks ago on 11/05/15 11/24/15; I brought this man back this week to review the wound after last week's deterioration. Culture grew Enterobacter and some staph aureus [MSSA]. I had ordered him Cipro but we don't seem to of been able to communicate with the patient therefore we've called in today. He is not been systemically unwell. Still using Aquacel 12/15/15; very little change in this wound since the last time I saw it. Horizontal wound over the lower  sacral area probably about 2 cm in width and 0.8 cm in depth. There is no evidence of infection. There appears to be callus around this. In spite of the patient's best efforts I think there is an adequate pressure relief here. No current evidence of infection 12/22/15; horizontal pressure ulcer over the lower sacral area. Appears to be filling in and has better dimensions in appearance today. Still circumferential callus which would suggest inadequate offloading although everything else appears to be improved 12/29/15 horizontal pressure wound over the lower sacral area. Appears to be filling in however skin groin down into the divot. No evidence of infection 01/05/16; comes in today with not much change in this wound. There is surrounding callus indicative of pressure. Not much change in the wound bed. This clearly require debridement today we have been using Hydrofera Blue 02/04/16: returns today after an absence. no systemic s/s of infection. once again, periwound callus is present suggesting pressure. 02/18/2016 -- she was away for 2 weeks traveling to [place] and during that time he had a new pressure injury to the left ischial tuberosity which he thinks may have been caused while transferring. 03-23-2016; Mr. Batson returns today, after missing last week's appointment, for evaluation of his left buttock stage III pressure ulcer. There is no ulceration to the left ischial tuberosity at this appointment. He states that he has been changing the dressing to his ulcer daily due to daily showering. He voices no complaints or concerns and denies benign antibiotic therapy for any reason at this point. 03/30/2016 -- the patient has been doing fine and after debridement of his wound today I believe is ready for a skin substitute and he has been previously approved for grafix and we will try and obtain this from next week. 04/06/2016 -- the patient's insurance was run again and he had $275 copayment and  he has declined the skin substitute. 04/12/16 patient arrived on the wrong day and as he drives from [place] we fit him in the scheduled for review. 05/08/2016 -- I have not seen him  for the last 2 weeks but he is doing his dressing diligently and says everything else is doing fine Objective Constitutional Pulse regular. Respirations normal and unlabored. Afebrile. Vitals Time Taken: 1:53 PM, Height: 67 in, Weight: 147 lbs, BMI: 23, Temperature: 97.5 F, Pulse: 74 bpm, Respiratory Rate: 16 breaths/min, Blood Pressure: 142/78 mmHg. [name], [name] ([id number]) Eyes Nonicteric. Reactive to light. Ears, Nose, Mouth, and Throat Lips, teeth, and gums WNL.Marland Kitchen Moist mucosa without lesions. Neck supple and nontender. No palpable supraclavicular or cervical adenopathy. Normal sized without goiter. Respiratory WNL. No retractions.. Breath sounds WNL, No rubs, rales, rhonchi, or wheeze.. Cardiovascular Heart rhythm and rate regular, no murmur or gallop.. Pedal Pulses WNL. No clubbing, cyanosis or edema. Chest Breasts symmetical and no nipple discharge.. Breast tissue WNL, no masses, lumps, or tenderness.. Lymphatic No adneopathy. No adenopathy. No adenopathy. Musculoskeletal Adexa without tenderness or enlargement.. Digits and nails w/o clubbing, cyanosis, infection, petechiae, ischemia, or inflammatory conditions.Marland Kitchen Psychiatric Judgement and insight Intact.. No evidence of depression, anxiety, or agitation.. General Notes: the wound had built up quite a bit of eschar and subcutaneous debris and had sharply removed this with a #3 curet and bleeding controlled with pressure Integumentary (Hair, Skin) No suspicious lesions. No crepitus or fluctuance. No peri-wound warmth or erythema. No masses.. Wound #1 status is Open. Original cause of wound was Pressure Injury. The wound is located on the Left Sacrum. The wound measures 0.3cm length x 0.9cm width x 0.4cm depth; 0.212cm^2 area and  0.085cm^3 volume. There is fat exposed. There is no tunneling or undermining noted. There is a large amount of serous drainage noted. The wound margin is epibole. There is large (67-100%) pink, pale granulation within the wound bed. There is a small (1-33%) amount of necrotic tissue within the wound bed including Eschar and Adherent Slough. The periwound skin appearance exhibited: Callus, Moist. The periwound skin appearance did not exhibit: Dry/Scaly, Maceration. Periwound temperature was noted as No Abnormality. Assessment Active Problems [name], [name] ([id number]) ICD-10 [phone] - Pressure ulcer of left buttock, stage 3 G82.22 - Paraplegia, incomplete Procedures Wound #1 Wound #1 is a Pressure Ulcer located on the Left Sacrum . There was a Skin/Subcutaneous Tissue Debridement ([id number]) debridement with total area of 0.27 sq cm performed by Evlyn Kanner, MD. with the following instrument(s): Curette to remove Viable and Non-Viable tissue/material including Exudate, Fibrin/Slough, and Subcutaneous after achieving pain control using Lidocaine 4% Topical Solution. A time out was conducted at 14:04, prior to the start of the procedure. A Minimum amount of bleeding was controlled with Pressure. The procedure was tolerated well with a pain level of 0 throughout and a pain level of 0 following the procedure. Post Debridement Measurements: 0.3cm length x 0.9cm width x 0.3cm depth; 0.064cm^3 volume. Post debridement Stage noted as Category/Stage III. Character of Wound/Ulcer Post Debridement requires further debridement. Severity of Tissue Post Debridement is: Fat layer exposed. Post procedure Diagnosis Wound #1: Same as Pre-Procedure Plan Wound Cleansing: Wound #1 Left Sacrum: Clean wound with Normal Saline. Anesthetic: Wound #1 Left Sacrum: Topical Lidocaine 4% cream applied to wound bed prior to debridement Skin Barriers/Peri-Wound Care: Wound #1 Left Sacrum: Barrier  cream Primary Wound Dressing: Wound #1 Left Sacrum: Other: - endoform Secondary Dressing: Wound #1 Left Sacrum: Foam Tegaderm Dressing Change Frequency: Wound #1 Left Sacrum: Other: - twice weekly [name], [name] ([id number]) Follow-up Appointments: Wound #1 Left Sacrum: Return Appointment in 1 week. Off-Loading: Wound #1 Left Sacrum: Roho cushion for wheelchair Turn and reposition every 2 hours We  will continue offloading, protein supplements, vitamin E, vitamin C and zinc have been discussed with him in great detail. He will use Endoform on alternate days and use a offloading foam as planned. Electronic Signature(s) Signed: 05/08/2016 2:16:46 PM By: Evlyn KannerBritto, Ashaad Gaertner MD, FACS Entered By: Evlyn KannerBritto, Norris Brumbach on 05/08/2016 14:16:46 Matthew EvangelistHARRIS, [name] ([id number]) -------------------------------------------------------------------------------- SuperBill Details Patient Name: Matthew EvangelistHARRIS, [name] Date of Service: 05/08/2016 Medical Record Number: [id number] Patient Account Number: [id number] Date of Birth/Sex: 1970-05-21 [age] y.o. Male) Treating RN: Phillis HaggisPinkerton, Debi Primary Care Physician: PATIENT, NO Other Clinician: Referring Physician: Treating Physician/Extender: Rudene ReBritto, Tonya Wantz Weeks in Treatment: 37 Diagnosis Coding ICD-10 Codes Code Description [phone] Pressure ulcer of left buttock, stage 3 G82.22 Paraplegia, incomplete Facility Procedures CPT4 Code: [id number] Description: 11042 - DEB SUBQ TISSUE 20 SQ CM/< ICD-10 Description Diagnosis L89.323 Pressure ulcer of left buttock, stage 3 G82.22 Paraplegia, incomplete Modifier: Quantity: 1 Physician Procedures CPT4 Code: [id number] Description: 11042 - WC PHYS SUBQ TISS 20 SQ CM ICD-10 Description Diagnosis L89.323 Pressure ulcer of left buttock, stage 3 G82.22 Paraplegia, incomplete Modifier: Quantity: 1 Electronic Signature(s) Signed: 05/08/2016 2:17:27 PM By: Evlyn KannerBritto, Katelynd Blauvelt MD, FACS Entered By: Evlyn KannerBritto, Nena Hampe on 05/08/2016 14:17:27

## 2016-05-15 ENCOUNTER — Telehealth: Payer: Self-pay | Admitting: *Deleted

## 2016-05-15 ENCOUNTER — Encounter: Payer: Medicare Other | Admitting: Surgery

## 2016-05-15 DIAGNOSIS — L89153 Pressure ulcer of sacral region, stage 3: Secondary | ICD-10-CM | POA: Diagnosis not present

## 2016-05-15 DIAGNOSIS — G8222 Paraplegia, incomplete: Secondary | ICD-10-CM

## 2016-05-15 DIAGNOSIS — L89323 Pressure ulcer of left buttock, stage 3: Secondary | ICD-10-CM | POA: Diagnosis not present

## 2016-05-15 DIAGNOSIS — R293 Abnormal posture: Secondary | ICD-10-CM

## 2016-05-15 NOTE — Telephone Encounter (Signed)
Order requested for PT for hips--placed order.

## 2016-05-16 NOTE — Progress Notes (Signed)
[name], [name] ([id number]) Visit Report for 05/15/2016 Arrival Information Details Patient Name: [name], [name] Date of Service: 05/15/2016 2:30 PM Medical Record Number: [id number] Patient Account Number: [id number] Date of Birth/Sex: 04-20-1971 [age] y.o. Male) Treating RN: Phillis HaggisPinkerton, Debi Primary Care Tamikka Pilger: PATIENT, NO Other Clinician: Referring Linken Mcglothen: Treating Khayman Kirsch/Extender: Rudene ReBritto, Errol Weeks in Treatment: 538 Visit Information History Since Last Visit All ordered tests and consults were completed: No Patient Arrived: Wheel Chair Added or deleted any medications: No Arrival Time: 14:43 Any new allergies or adverse reactions: No Accompanied By: self Had a fall or experienced change in No activities of daily living that may affect Transfer Assistance: None risk of falls: Patient Identification Verified: Yes Signs or symptoms of abuse/neglect since last No Secondary Verification Process Yes visito Completed: Hospitalized since last visit: No Patient Requires Transmission-Based No Has Dressing in Place as Prescribed: Yes Precautions: Pain Present Now: No Patient Has Alerts: No Electronic Signature(s) Signed: 05/15/2016 4:38:27 PM By: Alejandro MullingPinkerton, Debra Entered By: Alejandro MullingPinkerton, Debra on 05/15/2016 14:43:43 [name], [name] ([id number]) -------------------------------------------------------------------------------- Encounter Discharge Information Details Patient Name: [name], [name] Date of Service: 05/15/2016 2:30 PM Medical Record Number: [id number] Patient Account Number: [id number] Date of Birth/Sex: 04-20-1971 [age] y.o. Male) Treating RN: Phillis HaggisPinkerton, Debi Primary Care Loneta Tamplin: PATIENT, NO Other Clinician: Referring Lavin Petteway: Treating Roby Donaway/Extender: Rudene ReBritto, Errol Weeks in Treatment: 2638 Encounter Discharge Information Items Discharge Pain Level: 0 Discharge Condition: Stable Ambulatory Status: Wheelchair Discharge Destination: Home Transportation: Private  Auto Accompanied By: self Schedule Follow-up Appointment: Yes Medication Reconciliation completed Yes and provided to Patient/Care Reace Breshears: Patient Clinical Summary of Care: Declined Electronic Signature(s) Signed: 05/15/2016 3:10:36 PM By: Gwenlyn PerkingMoore, Shelia Entered By: Gwenlyn PerkingMoore, Shelia on 05/15/2016 15:10:36 [name], Rhyder ([id number]) -------------------------------------------------------------------------------- Lower Extremity Assessment Details Patient Name: [name], [name] Date of Service: 05/15/2016 2:30 PM Medical Record Number: [id number] Patient Account Number: [id number] Date of Birth/Sex: 04-20-1971 [age] y.o. Male) Treating RN: Phillis HaggisPinkerton, Debi Primary Care Mandela Bello: PATIENT, NO Other Clinician: Referring Sol Odor: Treating Telitha Plath/Extender: Rudene ReBritto, Errol Weeks in Treatment: 38 Electronic Signature(s) Signed: 05/15/2016 4:38:27 PM By: Alejandro MullingPinkerton, Debra Entered By: Alejandro MullingPinkerton, Debra on 05/15/2016 14:54:27 [name], [name] ([id number]) -------------------------------------------------------------------------------- Multi Wound Chart Details Patient Name: [name], [name] Date of Service: 05/15/2016 2:30 PM Medical Record Number: [id number] Patient Account Number: [id number] Date of Birth/Sex: 04-20-1971 [age] y.o. Male) Treating RN: Phillis HaggisPinkerton, Debi Primary Care Shanena Pellegrino: PATIENT, NO Other Clinician: Referring Elexia Friedt: Treating Reza Crymes/Extender: Rudene ReBritto, Errol Weeks in Treatment: 38 Vital Signs Height(in): 67 Pulse(bpm): 81 Weight(lbs): 147 Blood Pressure 124/81 (mmHg): Body Mass Index(BMI): 23 Temperature(F): 97.6 Respiratory Rate 18 (breaths/min): Photos: [1:No Photos] [N/A:N/A] Wound Location: [1:Left Sacrum] [N/A:N/A] Wounding Event: [1:Pressure Injury] [N/A:N/A] Primary Etiology: [1:Pressure Ulcer] [N/A:N/A] Comorbid History: [1:Paraplegia] [N/A:N/A] Date Acquired: [1:12/24/2014] [N/A:N/A] Weeks of Treatment: [1:38] [N/A:N/A] Wound Status: [1:Open]  [N/A:N/A] Measurements L x W x D 0.2x0.9x0.4 [N/A:N/A] (cm) Area (cm) : [1:0.141] [N/A:N/A] Volume (cm) : [1:0.057] [N/A:N/A] % Reduction in Area: [1:86.70%] [N/A:N/A] % Reduction in Volume: 89.20% [N/A:N/A] Classification: [1:Category/Stage III] [N/A:N/A] Exudate Amount: [1:Large] [N/A:N/A] Exudate Type: [1:Serous] [N/A:N/A] Exudate Color: [1:amber] [N/A:N/A] Wound Margin: [1:Epibole] [N/A:N/A] Granulation Amount: [1:Large (67-100%)] [N/A:N/A] Granulation Quality: [1:Pink, Pale] [N/A:N/A] Necrotic Amount: [1:Small (1-33%)] [N/A:N/A] Necrotic Tissue: [1:Eschar, Adherent Slough] [N/A:N/A] Exposed Structures: [1:Fat Layer (Subcutaneous Tissue) Exposed: Yes Fascia: No Tendon: No Muscle: No Joint: No Bone: No] [N/A:N/A] Epithelialization: None N/A N/A Debridement: Debridement ([id number](11042- N/A N/A 11047) Pre-procedure 14:55 N/A N/A Verification/Time Out Taken: Pain Control: Lidocaine 4% Topical N/A N/A Solution Tissue Debrided: Fibrin/Slough, Exudates, N/A N/A Subcutaneous Level: Skin/Subcutaneous N/A N/A Tissue Debridement Area (sq  0.18 N/A N/A cm): Instrument: Curette N/A N/A Bleeding: Minimum N/A N/A Hemostasis Achieved: Pressure N/A N/A Procedural Pain: 0 N/A N/A Post Procedural Pain: 0 N/A N/A Debridement Treatment Procedure was tolerated N/A N/A Response: well Post Debridement 0.3x0.9x0.4 N/A N/A Measurements L x W x D (cm) Post Debridement 0.085 N/A N/A Volume: (cm) Post Debridement Category/Stage III N/A N/A Stage: Periwound Skin Texture: Callus: Yes N/A N/A Periwound Skin Maceration: No N/A N/A Moisture: Dry/Scaly: No Periwound Skin Color: No Abnormalities Noted N/A N/A Temperature: No Abnormality N/A N/A Tenderness on No N/A N/A Palpation: Wound Preparation: Ulcer Cleansing: N/A N/A Rinsed/Irrigated with Saline Topical Anesthetic Applied: Other: lidocaine 4% Procedures Performed: Debridement N/A N/A Treatment Notes Electronic Signature(s) Signed:  05/15/2016 3:04:54 PM By: Evlyn Kanner MD, FACS [place], Leonette Most ([id number]) Entered By: Evlyn Kanner on 05/15/2016 15:04:54 Matthew Evangelist ([id number]) -------------------------------------------------------------------------------- Pain Assessment Details Patient Name: Matthew Evangelist Date of Service: 05/15/2016 2:30 PM Medical Record Number: [id number] Patient Account Number: [id number] Date of Birth/Sex: [birth year] (46 y.o. Male) Treating RN: Phillis Haggis Primary Care Theoren Palka: PATIENT, NO Other Clinician: Referring Daryan Buell: Treating Seven M[place]/Extender: Rudene Re in Treatment: 38 Active Problems Location of Pain Severity and Description of Pain Patient Has Paino No Site Locations With Dressing Change: No Pain Management and Medication Current Pain Management: Electronic Signature(s) Signed: 05/15/2016 4:38:27 PM By: Alejandro Mulling Entered By: Alejandro Mulling on 05/15/2016 14:43:50 Matthew Evangelist ([id number]) -------------------------------------------------------------------------------- Patient/Caregiver Education Details Patient Name: Matthew Evangelist Date of Service: 05/15/2016 2:30 PM Medical Record Number: [id number] Patient Account Number: [id number] Date of Birth/Gender: [birth year] (46 y.o. Male) Treating RN: Phillis Haggis Primary Care Physician: PATIENT, NO Other Clinician: Referring Physician: Treating Physician/Extender: Rudene Re in Treatment: 5 Education Assessment Education Provided To: Patient Education Topics Provided Wound/Skin Impairment: Handouts: Other: change dressing as ordered Methods: Demonstration, Explain/Verbal Responses: State content correctly Electronic Signature(s) Signed: 05/15/2016 4:38:27 PM By: Alejandro Mulling Entered By: Alejandro Mulling on 05/15/2016 15:10:06 Matthew Evangelist ([id number]) -------------------------------------------------------------------------------- Wound Assessment Details Patient  Name: Matthew Evangelist Date of Service: 05/15/2016 2:30 PM Medical Record Number: [id number] Patient Account Number: [id number] Date of Birth/Sex: 1971/03/22 (46 y.o. Male) Treating RN: Phillis Haggis Primary Care Shravan Salahuddin: PATIENT, NO Other Clinician: Referring Sophiah Rolin: Treating Cydney Alvarenga/Extender: Rudene Re in Treatment: 38 Wound Status Wound Number: 1 Primary Etiology: Pressure Ulcer Wound Location: Left Sacrum Wound Status: Open Wounding Event: Pressure Injury Comorbid History: Paraplegia Date Acquired: 12/24/2014 Weeks Of Treatment: 38 Clustered Wound: No Photos Photo Uploaded By: Alejandro Mulling on 05/15/2016 15:23:29 Wound Measurements Length: (cm) 0.2 Width: (cm) 0.9 Depth: (cm) 0.4 Area: (cm) 0.141 Volume: (cm) 0.057 % Reduction in Area: 86.7% % Reduction in Volume: 89.2% Epithelialization: None Tunneling: No Undermining: No Wound Description Classification: Category/Stage III Wound Margin: Epibole Exudate Amount: Large Exudate Type: Serous Exudate Color: amber Foul Odor After Cleansing: No Wound Bed Granulation Amount: Large (67-100%) Exposed Structure Granulation Quality: Pink, Pale Fascia Exposed: No Necrotic Amount: Small (1-33%) Fat Layer (Subcutaneous Tissue) Exposed: Yes Necrotic Quality: Eschar, Adherent Slough Tendon Exposed: No GORDEN, STTHOMAS ([id number]) Muscle Exposed: No Joint Exposed: No Bone Exposed: No Periwound Skin Texture Texture Color No Abnormalities Noted: No No Abnormalities Noted: No Callus: Yes Temperature / Pain Moisture Temperature: No Abnormality No Abnormalities Noted: No Dry / Scaly: No Maceration: No Wound Preparation Ulcer Cleansing: Rinsed/Irrigated with Saline Topical Anesthetic Applied: Other: lidocaine 4%, Treatment Notes Wound #1 (Left Sacrum) 1. Cleansed with: Clean wound with Normal Saline 2. Anesthetic Topical Lidocaine 4% cream to wound bed prior to debridement 4. Dressing  Applied: Other dressing (specify in notes) 5. Secondary Dressing Applied Foam Tegaderm Notes endoform Electronic Signature(s) Signed: 05/15/2016 4:38:27 PM By: Alejandro Mulling Entered By: Alejandro Mulling on 05/15/2016 14:52:55 Matthew Evangelist ([id number]) -------------------------------------------------------------------------------- Vitals Details Patient Name: Matthew Evangelist Date of Service: 05/15/2016 2:30 PM Medical Record Number: [id number] Patient Account Number: [id number] Date of Birth/Sex: 1970/10/16 (46 y.o. Male) Treating RN: Phillis Haggis Primary Care Jayton Popelka: PATIENT, NO Other Clinician: Referring Zahriah Roes: Treating Charrie Mcconnon/Extender: Rudene Re in Treatment: 38 Vital Signs Time Taken: 14:45 Temperature (F): 97.6 Height (in): 67 Pulse (bpm): 81 Weight (lbs): 147 Respiratory Rate (breaths/min): 18 Body Mass Index (BMI): 23 Blood Pressure (mmHg): 124/81 Reference Range: 80 - 120 mg / dl Electronic Signature(s) Signed: 05/15/2016 4:38:27 PM By: Alejandro Mulling Entered By: Alejandro Mulling on 05/15/2016 14:46:34

## 2016-05-16 NOTE — Progress Notes (Signed)
[name] ([id number]) Visit Report for 05/15/2016 Chief Complaint Document Details Patient Name: [name], [name] Date of Service: 05/15/2016 2:30 PM Medical Record Number: [id number] Patient Account Number: [id number] Date of Birth/Sex: Feb 05, 1971 (46 y.o. Male) Treating RN: Phillis Haggis Primary Care Provider: PATIENT, NO Other Clinician: Referring Provider: Treating Provider/Extender: Rudene Re in Treatment: 24 Information Obtained from: Patient Chief Complaint Mr. Keay presents today, after missing last week's appointment, for follow-up on his left buttock stage III pressure ulcer. Electronic Signature(s) Signed: 05/15/2016 3:08:02 PM By: Evlyn Kanner MD, FACS Entered By: Evlyn Kanner on 05/15/2016 15:08:02 YGNACIO, FECTEAU ([id number]) -------------------------------------------------------------------------------- Debridement Details Patient Name: [name] Date of Service: 05/15/2016 2:30 PM Medical Record Number: [id number] Patient Account Number: [id number] Date of Birth/Sex: Apr 04, 1971 (46 y.o. Male) Treating RN: Phillis Haggis Primary Care Provider: PATIENT, NO Other Clinician: Referring Provider: Treating Provider/Extender: Rudene Re in Treatment: 38 Debridement Performed for Wound #1 Left Sacrum Assessment: Performed By: Physician Evlyn Kanner, MD Debridement: Debridement Pre-procedure Yes - 14:55 Verification/Time Out Taken: Start Time: 14:56 Pain Control: Lidocaine 4% Topical Solution Level: Skin/Subcutaneous Tissue Total Area Debrided (L x 0.2 (cm) x 0.9 (cm) = 0.18 (cm) W): Tissue and other Viable, Non-Viable, Exudate, Fibrin/Slough, Subcutaneous material debrided: Instrument: Curette Bleeding: Minimum Hemostasis Achieved: Pressure End Time: 14:59 Procedural Pain: 0 Post Procedural Pain: 0 Response to Treatment: Procedure was tolerated well Post Debridement Measurements of Total Wound Length: (cm) 0.3 Stage:  Category/Stage III Width: (cm) 0.9 Depth: (cm) 0.4 Volume: (cm) 0.085 Character of Wound/Ulcer Post Requires Further Debridement: Debridement Severity of Tissue Post Fat layer exposed Debridement: Post Procedure Diagnosis Same as Pre-procedure Notes after sharply debriding the wound there is good resolution of the size and depth and we will continue with local care Electronic Signature(s) CAMILA, NORVILLE ([id number]) Signed: 05/15/2016 3:07:53 PM By: Evlyn Kanner MD, FACS Signed: 05/15/2016 4:38:27 PM By: Alejandro Mulling Entered By: Evlyn Kanner on 05/15/2016 15:07:53 NATTHEW, MARLATT ([id number]) -------------------------------------------------------------------------------- HPI Details Patient Name: [name] Date of Service: 05/15/2016 2:30 PM Medical Record Number: [id number] Patient Account Number: [id number] Date of Birth/Sex: [birth year] (46 y.o. 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 He was diagnosed with what sounds like a stage III pressure ulcer in September 2016 although this may have been present for some time longer than that perhaps July 2016. He was followed by wound care in [place]. By his description there were using Silvadene cream for a period of time and then 3 months ago change to Aquacel Ag which she is continued and changes daily with help from his mother. He has recently relocated to [facility]. He went to urgent care to follow-up on his wound on 08/04/2015 they did a culture of this wound that showed Pseudomonas and MSSA. He was placed on an antibiotic but doesn't remember which one it is. He has continued using Aquacel Ag and a  foam based cover. I don't have any of his records from the wound care center in [place] although the patient states he did have an MRI that did not show osteomyelitis. He is not aware of having a bone culture done and doesn't think that he had exposed bone on this and any point. The patient does in and out cath's and has  no urinary incontinence issues. He is on appropriate bowel regimen which she administers suppositories. He is able to position himself appropriately and is wheelchair to try and avoid pressure over this wound 08/24/15; small horizontal wound over his lower sacral area. Requires debridement of surface slough and nonviable subcutaneous tissue. There is a small amount of undermining noted no evidence of infection. On the right side of this wound there is some senescent rolled edges which may need debridement I did not do this today. 09/01/15 this is a small wound over the lower sacral area. It has tunneling superiorly. This goes roughly 1 cm. Nonviable subcutaneous tissue at the inferior aspect of the wound base. No real evidence of infection 09/07/15; small horizontal wound over the lower sacral area. Last week it had 1 cm of superior tunneling which appears to of resolved. No evidence of infection 09/21/15; continues to have a small horizontal wound over the lower sacral area. We have been using Prisma and this has contracted. He tells me that his mother is aware medication not able to change the dressing although he states that he is able to do this 09/28/15; a small horizontal wound over his lower sacral area is all that is left of this wound which was probably much larger and deeper at one point. Depth today at 0.7 cm. Base of this had some nonviable subcutaneous tissue that I removed. Appears to be stalling on current dressing which is Prisma 10/19/15; I have not seen this patient in several weeks. He arrives today with a linear/horizontal wound across his lower sacral area.  Considerable depth of this wound. Extensive debridement done 11/17/15; patient arrives for two-week visit. He is not doing well wound is bigger with odor and drainage. Has been using Aquacel Ag. I have not seen this in over a month although I think he was seen by the physician covering me 2 weeks ago on 11/05/15 11/24/15; I brought this man back this week to review the wound after last week's deterioration. Culture grew Enterobacter and some staph aureus [MSSA]. I had ordered him Cipro but we don't seem to of been able to communicate with the patient therefore we've called in today. He is not been systemically unwell. Still using Aquacel 12/15/15; very little change in this wound since the last time I saw it. Horizontal wound over the lower sacral Geisler, Sebastain ([id number]) area probably about 2 cm in width and 0.8 cm in depth. There is no evidence of infection. There appears to be callus around this. In spite of the patient's best efforts I think there is an adequate pressure relief here. No current evidence of infection 12/22/15; horizontal pressure ulcer over the lower sacral area. Appears to be filling in and has better dimensions in appearance today. Still circumferential callus which would suggest inadequate offloading although everything else appears to be improved 12/29/15 horizontal pressure wound over the lower sacral area. Appears to be filling in however skin groin down into the divot. No evidence of infection 01/05/16; comes in today with not much change in this wound. There is surrounding callus indicative of pressure. Not much change in the wound bed. This clearly require debridement today we have been using Hydrofera Blue 02/04/16: returns today after an absence. no systemic s/s of infection. once again, periwound callus is present suggesting pressure. 02/18/2016 -- she was away for 2 weeks traveling to [place] and during that time he had a new pressure injury to the left ischial  tuberosity which he thinks may  have been caused while transferring. 03-23-2016; Mr. Viglione returns today, after missing last week's appointment, for evaluation of his left buttock stage III pressure ulcer. There is no ulceration to the left ischial tuberosity at this appointment. He states that he has been changing the dressing to his ulcer daily due to daily showering. He voices no complaints or concerns and denies benign antibiotic therapy for any reason at this point. 03/30/2016 -- the patient has been doing fine and after debridement of his wound today I believe is ready for a skin substitute and he has been previously approved for grafix and we will try and obtain this from next week. 04/06/2016 -- the patient's insurance was run again and he had $275 copayment and he has declined the skin substitute. 12/20/17opatient arrived on the wrong day and as he drives from [place] we fit him in the scheduled for review. 05/08/2016 -- I have not seen him for the last 2 weeks but he is doing his dressing diligently and says everything else is doing fine Electronic Signature(s) Signed: 05/15/2016 3:08:12 PM By: Evlyn Kanner MD, FACS Entered By: Evlyn Kanner on 05/15/2016 15:08:12 JAMELLE, GOLDSTON ([id number]) -------------------------------------------------------------------------------- Physical Exam Details Patient Name: [name] Date of Service: 05/15/2016 2:30 PM Medical Record Number: [id number] Patient Account Number: [id number] Date of Birth/Sex: 1970/09/01 (46 y.o. Male) Treating RN: Phillis Haggis Primary Care Provider: PATIENT, NO Other Clinician: Referring Provider: Treating Provider/Extender: Rudene Re in Treatment: 38 Constitutional . Pulse regular. Respirations normal and unlabored. Afebrile. . Eyes Nonicteric. Reactive to light. Ears, Nose, Mouth, and Throat Lips, teeth, and gums WNL.Marland Kitchen Moist mucosa without lesions. Neck supple and nontender. No palpable  supraclavicular or cervical adenopathy. Normal sized without goiter. Respiratory WNL. No retractions.. Breath sounds WNL, No rubs, rales, rhonchi, or wheeze.. Cardiovascular Heart rhythm and rate regular, no murmur or gallop.. Pedal Pulses WNL. No clubbing, cyanosis or edema. Chest Breasts symmetical and no nipple discharge.. Breast tissue WNL, no masses, lumps, or tenderness.. Lymphatic No adneopathy. No adenopathy. No adenopathy. Musculoskeletal Adexa without tenderness or enlargement.. Digits and nails w/o clubbing, cyanosis, infection, petechiae, ischemia, or inflammatory conditions.. Integumentary (Hair, Skin) No suspicious lesions. No crepitus or fluctuance. No peri-wound warmth or erythema. No masses.Marland Kitchen Psychiatric Judgement and insight Intact.. No evidence of depression, anxiety, or agitation.. Notes sharply debriding the wound with a #3 curet the overall size and depth has decreased significantly and there is good resolution with the local care with Endoform packing Electronic Signature(s) Signed: 05/15/2016 3:08:52 PM By: Evlyn Kanner MD, FACS Entered By: Evlyn Kanner on 05/15/2016 15:08:51 [name] ([id number]) -------------------------------------------------------------------------------- Physician Orders Details Patient Name: [name] Date of Service: 05/15/2016 2:30 PM Medical Record Number: [id number] Patient Account Number: [id number] Date of Birth/Sex: 02/28/1971 (46 y.o. Male) Treating RN: Phillis Haggis Primary Care Provider: PATIENT, NO Other Clinician: Referring Provider: Treating Provider/Extender: Rudene Re in Treatment: 68 Verbal / Phone Orders: Yes Clinician: Pinkerton, Debi Read Back and Verified: Yes Diagnosis Coding Wound Cleansing Wound #1 Left Sacrum o Clean wound with Normal Saline. Anesthetic Wound #1 Left Sacrum o Topical Lidocaine 4% cream applied to wound bed prior to debridement Skin Barriers/Peri-Wound  Care Wound #1 Left Sacrum o Barrier cream Primary Wound Dressing Wound #1 Left Sacrum o Other: - endoform Secondary Dressing Wound #1 Left Sacrum o Foam o Tegaderm Dressing Change Frequency Wound #1 Left Sacrum o Other: - twice weekly Follow-up Appointments Wound #1 Left Sacrum o Return Appointment in 1 week. Off-Loading Wound #1 Left Sacrum o Roho  cushion for wheelchair o Turn and reposition every 2 hours JEVON, SHELLS ([id number]) Electronic Signature(s) Signed: 05/15/2016 4:24:55 PM By: Evlyn Kanner MD, FACS Signed: 05/15/2016 4:38:27 PM By: Alejandro Mulling Entered By: Alejandro Mulling on 05/15/2016 15:08:41 KEILYN, NADAL ([id number]) -------------------------------------------------------------------------------- Problem List Details Patient Name: [name] Date of Service: 05/15/2016 2:30 PM Medical Record Number: [id number] Patient Account Number: [id number] Date of Birth/Sex: 1971/03/17 (46 y.o. Male) Treating RN: Phillis Haggis Primary Care Provider: PATIENT, NO Other Clinician: Referring Provider: Treating Provider/Extender: Rudene Re in Treatment: 72 Active Problems ICD-10 Encounter Code Description Active Date Diagnosis L89.323 Pressure ulcer of left buttock, stage 3 03/23/2016 Yes G82.22 Paraplegia, incomplete 08/17/2015 Yes Inactive Problems Resolved Problems ICD-10 Code Description Active Date Resolved Date L89.322 Pressure ulcer of left buttock, stage 2 02/18/2016 02/18/2016 L89.153 Pressure ulcer of sacral region, stage 3 08/17/2015 08/17/2015 Electronic Signature(s) Signed: 05/15/2016 3:04:50 PM By: Evlyn Kanner MD, FACS Entered By: Evlyn Kanner on 05/15/2016 15:04:49 [name] ([id number]) -------------------------------------------------------------------------------- Progress Note Details Patient Name: [name] Date of Service: 05/15/2016 2:30 PM Medical Record Number: [id number] Patient Account  Number: [id number] Date of Birth/Sex: 1971-03-18 (46 y.o. Male) Treating RN: Phillis Haggis Primary Care Provider: PATIENT, NO Other Clinician: Referring Provider: Treating Provider/Extender: Rudene Re in Treatment: 17 Subjective Chief Complaint Information obtained from Patient Mr. Kassebaum presents today, after missing last week's appointment, for follow-up on his left buttock stage III pressure ulcer. 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 He was diagnosed with what sounds like a stage III pressure ulcer in September 2016 although this may have been present for some time longer than that perhaps July 2016. He was followed by wound care in [place]. By his description there were using Silvadene cream for a period of time and then 3 months ago change to Aquacel Ag which she is continued and changes daily with help from his mother. He has recently relocated to [facility]. He went to urgent care to follow- up on his wound on 08/04/2015 they did a culture of this wound that showed Pseudomonas and MSSA. He was placed on an antibiotic but doesn't remember which one it is. He has continued using Aquacel Ag and a foam based cover. I don't have any of his records from the wound care center in  although the patient states he did have an MRI that did not show osteomyelitis. He is not aware of having a bone culture done and doesn't think that he had exposed bone on this and any point. The patient does in and out cath's and has no urinary incontinence issues. He is on appropriate bowel regimen which she administers suppositories. He is able to position himself appropriately and is wheelchair to try and avoid pressure over this wound 08/24/15; small horizontal wound over his lower sacral area. Requires debridement of surface slough and nonviable  subcutaneous tissue. There is a small amount of undermining noted no evidence of infection. On the right side of this wound there is some senescent rolled edges which may need debridement I did not do this today. 09/01/15 this is a small wound over the lower sacral area. It has tunneling superiorly. This goes roughly 1 cm. Nonviable subcutaneous tissue at the inferior aspect of the wound base. No real evidence of infection 09/07/15; small horizontal wound over the lower sacral area. Last week it had 1 cm of  superior tunneling which appears to of resolved. No evidence of infection 09/21/15; continues to have a small horizontal wound over the lower sacral area. We have been using Prisma and this has contracted. He tells me that his mother is aware medication not able to change the dressing although he states that he is able to do this 09/28/15; a small horizontal wound over his lower sacral area is all that is left of this wound which was probably much larger and deeper at one point. Depth today at 0.7 cm. Base of this had some nonviable subcutaneous tissue that I removed. Appears to be stalling on current dressing which is Prisma 10/19/15; I have not seen this patient in several weeks. He arrives today with a linear/horizontal wound across his lower sacral area. Considerable depth of this wound. Extensive debridement done 11/17/15; patient arrives for two-week visit. He is not doing well wound is bigger with odor and drainage. Has DYSEN, EDMONDSON ([id number]) been using Aquacel Ag. I have not seen this in over a month although I think he was seen by the physician covering me 2 weeks ago on 11/05/15 11/24/15; I brought this man back this week to review the wound after last week's deterioration. Culture grew Enterobacter and some staph aureus [MSSA]. I had ordered him Cipro but we don't seem to of been able to communicate with the patient therefore we've called in today. He is not been systemically unwell.  Still using Aquacel 12/15/15; very little change in this wound since the last time I saw it. Horizontal wound over the lower sacral area probably about 2 cm in width and 0.8 cm in depth. There is no evidence of infection. There appears to be callus around this. In spite of the patient's best efforts I think there is an adequate pressure relief here. No current evidence of infection 12/22/15; horizontal pressure ulcer over the lower sacral area. Appears to be filling in and has better dimensions in appearance today. Still circumferential callus which would suggest inadequate offloading although everything else appears to be improved 12/29/15 horizontal pressure wound over the lower sacral area. Appears to be filling in however skin groin down into the divot. No evidence of infection 01/05/16; comes in today with not much change in this wound. There is surrounding callus indicative of pressure. Not much change in the wound bed. This clearly require debridement today we have been using Hydrofera Blue 02/04/16: returns today after an absence. no systemic s/s of infection. once again, periwound callus is present suggesting pressure. 02/18/2016 -- she was away for 2 weeks traveling to [place] and during that time he had a new pressure injury to the left ischial tuberosity which he thinks may have been caused while transferring. 03-23-2016; Mr. Falconi returns today, after missing last week's appointment, for evaluation of his left buttock stage III pressure ulcer. There is no ulceration to the left ischial tuberosity at this appointment. He states that he has been changing the dressing to his ulcer daily due to daily showering. He voices no complaints or concerns and denies benign antibiotic therapy for any reason at this point. 03/30/2016 -- the patient has been doing fine and after debridement of his wound today I believe is ready for a skin substitute and he has been previously approved for grafix and we  will try and obtain this from next week. 04/06/2016 -- the patient's insurance was run again and he had $275 copayment and he has declined the skin substitute. 04/12/16 patient arrived on the  wrong day and as he drives from [place] we fit him in the scheduled for review. 05/08/2016 -- I have not seen him for the last 2 weeks but he is doing his dressing diligently and says everything else is doing fine Objective Constitutional Pulse regular. Respirations normal and unlabored. Afebrile. Vitals Time Taken: 2:45 PM, Height: 67 in, Weight: 147 lbs, BMI: 23, Temperature: 97.6 F, Pulse: 81 bpm, Respiratory Rate: 18 breaths/min, Blood Pressure: 124/81 mmHg. UCHENNA, RAPPAPORT ([id number]) Eyes Nonicteric. Reactive to light. Ears, Nose, Mouth, and Throat Lips, teeth, and gums WNL.Marland Kitchen Moist mucosa without lesions. Neck supple and nontender. No palpable supraclavicular or cervical adenopathy. Normal sized without goiter. Respiratory WNL. No retractions.. Breath sounds WNL, No rubs, rales, rhonchi, or wheeze.. Cardiovascular Heart rhythm and rate regular, no murmur or gallop.. Pedal Pulses WNL. No clubbing, cyanosis or edema. Chest Breasts symmetical and no nipple discharge.. Breast tissue WNL, no masses, lumps, or tenderness.. Lymphatic No adneopathy. No adenopathy. No adenopathy. Musculoskeletal Adexa without tenderness or enlargement.. Digits and nails w/o clubbing, cyanosis, infection, petechiae, ischemia, or inflammatory conditions.Marland Kitchen Psychiatric Judgement and insight Intact.. No evidence of depression, anxiety, or agitation.. General Notes: sharply debriding the wound with a #3 curet the overall size and depth has decreased significantly and there is good resolution with the local care with Endoform packing Integumentary (Hair, Skin) No suspicious lesions. No crepitus or fluctuance. No peri-wound warmth or erythema. No masses.. Wound #1 status is Open. Original cause of wound was  Pressure Injury. The wound is located on the Left Sacrum. The wound measures 0.2cm length x 0.9cm width x 0.4cm depth; 0.141cm^2 area and 0.057cm^3 volume. There is Fat Layer (Subcutaneous Tissue) Exposed exposed. There is no tunneling or undermining noted. There is a large amount of serous drainage noted. The wound margin is epibole. There is large (67- 100%) pink, pale granulation within the wound bed. There is a small (1-33%) amount of necrotic tissue within the wound bed including Eschar and Adherent Slough. The periwound skin appearance exhibited: Callus. The periwound skin appearance did not exhibit: Dry/Scaly, Maceration. Periwound temperature was noted as No Abnormality. Assessment CYLE, KENYON ([id number]) Active Problems ICD-10 [phone] - Pressure ulcer of left buttock, stage 3 G82.22 - Paraplegia, incomplete Procedures Wound #1 Wound #1 is a Pressure Ulcer located on the Left Sacrum . There was a Skin/Subcutaneous Tissue Debridement ([id number]) debridement with total area of 0.18 sq cm performed by Evlyn Kanner, MD. with the following instrument(s): Curette to remove Viable and Non-Viable tissue/material including Exudate, Fibrin/Slough, and Subcutaneous after achieving pain control using Lidocaine 4% Topical Solution. A time out was conducted at 14:55, prior to the start of the procedure. A Minimum amount of bleeding was controlled with Pressure. The procedure was tolerated well with a pain level of 0 throughout and a pain level of 0 following the procedure. Post Debridement Measurements: 0.3cm length x 0.9cm width x 0.4cm depth; 0.085cm^3 volume. Post debridement Stage noted as Category/Stage III. Character of Wound/Ulcer Post Debridement requires further debridement. Severity of Tissue Post Debridement is: Fat layer exposed. Post procedure Diagnosis Wound #1: Same as Pre-Procedure General Notes: after sharply debriding the wound there is good resolution of the size and  depth and we will continue with local care. Plan Wound Cleansing: Wound #1 Left Sacrum: Clean wound with Normal Saline. Anesthetic: Wound #1 Left Sacrum: Topical Lidocaine 4% cream applied to wound bed prior to debridement Skin Barriers/Peri-Wound Care: Wound #1 Left Sacrum: Barrier cream Primary Wound Dressing: Wound #1 Left Sacrum: Other: -  endoform Secondary Dressing: Wound #1 Left Sacrum: Foam Tegaderm Matthew EvangelistHARRIS, Alphonsus ([id number]) Dressing Change Frequency: Wound #1 Left Sacrum: Other: - twice weekly Follow-up Appointments: Wound #1 Left Sacrum: Return Appointment in 1 week. Off-Loading: Wound #1 Left Sacrum: Roho cushion for wheelchair Turn and reposition every 2 hours The overall progress has been good and I'm pleased with the findings. We will continue offloading, protein supplements, vitamin E, vitamin C and zinc have been discussed with him in great detail. He will use Endoform on alternate days and use a offloading foam as planned. Electronic Signature(s) Signed: 05/15/2016 3:09:49 PM By: Evlyn KannerBritto, Aniza Shor MD, FACS Entered By: Evlyn KannerBritto, Avy Barlett on 05/15/2016 15:09:49 Matthew EvangelistHARRIS, Candler ([id number]) -------------------------------------------------------------------------------- SuperBill Details Patient Name: Matthew EvangelistHARRIS, Aris Date of Service: 05/15/2016 Medical Record Number: [id number] Patient Account Number: [id number] Date of Birth/Sex: September 30, 1970 [age] y.o. Male) Treating RN: Phillis HaggisPinkerton, Debi Primary Care Provider: PATIENT, NO Other Clinician: Referring Provider: Treating Provider/Extender: Rudene ReBritto, Shalena Ezzell Weeks in Treatment: 38 Diagnosis Coding ICD-10 Codes Code Description L89.323 Pressure ulcer of left buttock, stage 3 G82.22 Paraplegia, incomplete Facility Procedures CPT4 Code: [id number] Description: 11042 - DEB SUBQ TISSUE 20 SQ CM/< ICD-10 Description Diagnosis L89.323 Pressure ulcer of left buttock, stage 3 G82.22 Paraplegia, incomplete Modifier: Quantity:  1 Physician Procedures CPT4 Code: [id number] Description: 11042 - WC PHYS SUBQ TISS 20 SQ CM ICD-10 Description Diagnosis L89.323 Pressure ulcer of left buttock, stage 3 G82.22 Paraplegia, incomplete Modifier: Quantity: 1 Electronic Signature(s) Signed: 05/15/2016 3:10:17 PM By: Evlyn KannerBritto, Keily Lepp MD, FACS Entered By: Evlyn KannerBritto, Alora Gorey on 05/15/2016 15:10:16

## 2016-05-18 ENCOUNTER — Ambulatory Visit
Payer: Medicare Other | Attending: Physical Medicine & Rehabilitation | Admitting: Rehabilitative and Restorative Service Providers"

## 2016-05-18 DIAGNOSIS — R293 Abnormal posture: Secondary | ICD-10-CM | POA: Insufficient documentation

## 2016-05-18 NOTE — Patient Instructions (Addendum)
Butterfly, Sitting    Sit straight or with back against your bed. Gently push knees toward floor. Hold  60 seconds. Do __2_ sessions per day.  Copyright  VHI. All rights reserved.      SIDE SIT ON THE RIGHT SIDE, prop on right elbow and hold for 2 minutes.  2 times/day.   TO STRETCH QUADRATUS LUMBORUM
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
## 2016-05-22 ENCOUNTER — Encounter: Payer: Medicare Other | Admitting: Surgery

## 2016-05-22 DIAGNOSIS — G8222 Paraplegia, incomplete: Secondary | ICD-10-CM | POA: Diagnosis not present

## 2016-05-22 DIAGNOSIS — L89323 Pressure ulcer of left buttock, stage 3: Secondary | ICD-10-CM | POA: Diagnosis not present

## 2016-05-22 NOTE — Progress Notes (Signed)
[name] ([id number]) Visit Report for 05/22/2016 Chief Complaint Document Details Patient Name: [name] Date of Service: 05/22/2016 2:30 PM Medical Record Number: [id number] Patient Account Number: [id number] Date of Birth/Sex: 06/27/1970 (46 y.o. Male) Treating RN: [name] Primary Care Provider: PATIENT, NO Other Clinician: Referring Provider: Treating Provider/Extender: [name] in Treatment: 72 Information Obtained from: Patient Chief Complaint [name] presents today, after missing last week's appointment, for follow-up on his left buttock stage III pressure ulcer. Electronic Signature(s) Signed: 05/22/2016 3:15:52 PM By: Matthew Kanner MD, FACS Entered By: [name] on 05/22/2016 15:15:52 [name] ([id number]) -------------------------------------------------------------------------------- Debridement Details Patient Name: [name] Date of Service: 05/22/2016 2:30 PM Medical Record Number: [id number] Patient Account Number: [id number] Date of Birth/Sex: January 05, 1971 (46 y.o. Male) Treating RN: [name] Primary Care Provider: PATIENT, NO Other Clinician: Referring Provider: Treating Provider/Extender: [name] in Treatment: 39 Debridement Performed for Wound #1 Left Sacrum Assessment: Performed By: Physician Matthew Kanner, MD Debridement: Debridement Pre-procedure Yes - 15:04 Verification/Time Out Taken: Start Time: 15:05 Pain Control: Lidocaine 4% Topical Solution Level: Skin/Subcutaneous Tissue Total Area Debrided (L x 0.2 (cm) x 0.7 (cm) = 0.14 (cm) W): Tissue and other Viable, Non-Viable, Exudate, Fibrin/Slough, Subcutaneous material debrided: Instrument: Curette Bleeding: Minimum Hemostasis Achieved: Pressure End Time: 15:08 Procedural Pain: 0 Post Procedural Pain: 0 Response to Treatment: Procedure was tolerated well Post Debridement Measurements of Total Wound Length: (cm) 0.2 Stage:  Category/Stage III Width: (cm) 0.4 Depth: (cm) 0.2 Volume: (cm) 0.013 Character of Wound/Ulcer Post Requires Further Debridement: Debridement Severity of Tissue Post Fat layer exposed Debridement: Post Procedure Diagnosis Same as Pre-procedure Electronic Signature(s) Signed: 05/22/2016 4:18:36 PM By: Matthew Kanner MD, FACS Signed: 05/22/2016 4:23:36 PM By: [name] Previous Signature: 05/22/2016 3:15:41 PM Version By: Matthew Kanner MD, FACS [name], [name] ([id number]) Entered By: [name] on 05/22/2016 15:19:35 [name], [name] ([id number]) -------------------------------------------------------------------------------- HPI Details Patient Name: [name] Date of Service: 05/22/2016 2:30 PM Medical Record Number: [id number] Patient Account Number: [id number] Date of Birth/Sex: [birth year] (46 y.o. 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 He was diagnosed with what sounds like a stage III pressure ulcer in September 2016 although this may have been present for some time longer than that perhaps July 2016. He was followed by wound care in [place]. By his description there were using Silvadene cream for a period of time and then 3 months ago change to Aquacel Ag which she is continued and changes daily with help from his mother. He has recently relocated to [facility]. He went to urgent care to follow-up on his wound on 08/04/2015 they did a culture of this wound that showed Pseudomonas and MSSA. He was placed on an antibiotic but doesn't remember which one it is. He has continued using Aquacel Ag and a foam based cover. I don't have any of his  records from the wound care center in [place] although the patient states he did have an MRI that did not show osteomyelitis. He is not aware of having a bone culture done and doesn't think that he had exposed bone on this and any point. The patient does in and out cath's and has no urinary incontinence issues. He is on appropriate bowel regimen which  she administers suppositories. He is able to position himself appropriately and is wheelchair to try and avoid pressure over this wound 08/24/15; small horizontal wound over his lower sacral area. Requires debridement of surface slough and nonviable subcutaneous tissue. There is a small amount of undermining noted no evidence of infection. On the right side of this wound there is some senescent rolled edges which may need debridement I did not do this today. 09/01/15 this is a small wound over the lower sacral area. It has tunneling superiorly. This goes roughly 1 cm. Nonviable subcutaneous tissue at the inferior aspect of the wound base. No real evidence of infection 09/07/15; small horizontal wound over the lower sacral area. Last week it had 1 cm of superior tunneling which appears to of resolved. No evidence of infection 09/21/15; continues to have a small horizontal wound over the lower sacral area. We have been using Prisma and this has contracted. He tells me that his mother is aware medication not able to change the dressing although he states that he is able to do this 09/28/15; a small horizontal wound over his lower sacral area is all that is left of this wound which was probably much larger and deeper at one point. Depth today at 0.7 cm. Base of this had some nonviable subcutaneous tissue that I removed. Appears to be stalling on current dressing which is Prisma 10/19/15; I have not seen this patient in several weeks. He arrives today with a linear/horizontal wound across his lower sacral area. Considerable depth of this wound. Extensive  debridement done 11/17/15; patient arrives for two-week visit. He is not doing well wound is bigger with odor and drainage. Has been using Aquacel Ag. I have not seen this in over a month although I think he was seen by the physician covering me 2 weeks ago on 11/05/15 11/24/15; I brought this man back this week to review the wound after last week's deterioration. Culture grew Enterobacter and some staph aureus [MSSA]. I had ordered him Cipro but we don't seem to of been able to communicate with the patient therefore we've called in today. He is not been systemically unwell. Still using Aquacel 12/15/15; very little change in this wound since the last time I saw it. Horizontal wound over the lower sacral Ehrler, Neils ([id number]) area probably about 2 cm in width and 0.8 cm in depth. There is no evidence of infection. There appears to be callus around this. In spite of the patient's best efforts I think there is an adequate pressure relief here. No current evidence of infection 12/22/15; horizontal pressure ulcer over the lower sacral area. Appears to be filling in and has better dimensions in appearance today. Still circumferential callus which would suggest inadequate offloading although everything else appears to be improved 12/29/15 horizontal pressure wound over the lower sacral area. Appears to be filling in however skin groin down into the divot. No evidence of infection 01/05/16; comes in today with not much change in this wound. There is surrounding callus indicative of pressure. Not much change in the wound bed. This clearly require debridement today we have been using Hydrofera Blue 02/04/16: returns today after an absence. no systemic s/s of infection. once again, periwound callus is present suggesting pressure. 02/18/2016 -- she was away for 2 weeks traveling to [place] and during that time he had a new pressure injury to the left ischial tuberosity which he thinks may have been caused  while transferring. 03-23-2016; Mr. Caine returns today, after  missing last week's appointment, for evaluation of his left buttock stage III pressure ulcer. There is no ulceration to the left ischial tuberosity at this appointment. He states that he has been changing the dressing to his ulcer daily due to daily showering. He voices no complaints or concerns and denies benign antibiotic therapy for any reason at this point. 03/30/2016 -- the patient has been doing fine and after debridement of his wound today I believe is ready for a skin substitute and he has been previously approved for grafix and we will try and obtain this from next week. 04/06/2016 -- the patient's insurance was run again and he had $275 copayment and he has declined the skin substitute. 12/20/17opatient arrived on the wrong day and as he drives from [place] we fit him in the scheduled for review. 05/08/2016 -- I have not seen him for the last 2 weeks but he is doing his dressing diligently and says everything else is doing fine Electronic Signature(s) Signed: 05/22/2016 3:15:57 PM By: Matthew Kanner MD, FACS Entered By: [name] on 05/22/2016 15:15:57 [name], [name] ([id number]) -------------------------------------------------------------------------------- Physical Exam Details Patient Name: [name] Date of Service: 05/22/2016 2:30 PM Medical Record Number: [id number] Patient Account Number: [id number] Date of Birth/Sex: 11/24/1970 (46 y.o. Male) Treating RN: [name] Primary Care Provider: PATIENT, NO Other Clinician: Referring Provider: Treating Provider/Extender: [name] in Treatment: 39 Constitutional . Pulse regular. Respirations normal and unlabored. Afebrile. . Eyes Nonicteric. Reactive to light. Ears, Nose, Mouth, and Throat Lips, teeth, and gums WNL.Marland Kitchen Moist mucosa without lesions. Neck supple and nontender. No palpable supraclavicular or cervical adenopathy. Normal  sized without goiter. Respiratory WNL. No retractions.. Breath sounds WNL, No rubs, rales, rhonchi, or wheeze.. Cardiovascular Heart rhythm and rate regular, no murmur or gallop.. Pedal Pulses WNL. No clubbing, cyanosis or edema. Chest Breasts symmetical and no nipple discharge.. Breast tissue WNL, no masses, lumps, or tenderness.. Lymphatic No adneopathy. No adenopathy. No adenopathy. Musculoskeletal Adexa without tenderness or enlargement.. Digits and nails w/o clubbing, cyanosis, infection, petechiae, ischemia, or inflammatory conditions.. Integumentary (Hair, Skin) No suspicious lesions. No crepitus or fluctuance. No peri-wound warmth or erythema. No masses.Marland Kitchen Psychiatric Judgement and insight Intact.. No evidence of depression, anxiety, or agitation.. Notes the patient had a lot of eschar covering the wound and after sharply debriding the eschar and some of the subcutaneous tissue the wound looks excellent Electronic Signature(s) Signed: 05/22/2016 3:16:19 PM By: Matthew Kanner MD, FACS Entered By: [name] on 05/22/2016 15:16:19 [name] ([id number]) -------------------------------------------------------------------------------- Physician Orders Details Patient Name: [name] Date of Service: 05/22/2016 2:30 PM Medical Record Number: [id number] Patient Account Number: [id number] Date of Birth/Sex: Mar 16, 1971 (46 y.o. Male) Treating RN: [name] Primary Care Provider: PATIENT, NO Other Clinician: Referring Provider: Treating Provider/Extender: [name] in Treatment: 31 Verbal / Phone Orders: Yes Clinician: Pinkerton, Debi Read Back and Verified: Yes Diagnosis Coding Wound Cleansing Wound #1 Left Sacrum o Clean wound with Normal Saline. Anesthetic Wound #1 Left Sacrum o Topical Lidocaine 4% cream applied to wound bed prior to debridement Skin Barriers/Peri-Wound Care Wound #1 Left Sacrum o Barrier cream Primary Wound  Dressing Wound #1 Left Sacrum o Other: - endoform Secondary Dressing Wound #1 Left Sacrum o Foam o Tegaderm Dressing Change Frequency Wound #1 Left Sacrum o Other: - twice weekly Follow-up Appointments Wound #1 Left Sacrum o Return Appointment in 1 week. Off-Loading Wound #1 Left Sacrum o Roho cushion for wheelchair o Turn and reposition every 2 hours [name], [name] ([id number])  Electronic Signature(s) Signed: 05/22/2016 4:18:36 PM By: Matthew Kanner MD, FACS Signed: 05/22/2016 4:23:36 PM By: [name] Entered By: [name] on 05/22/2016 15:07:57 [name], [name] ([id number]) -------------------------------------------------------------------------------- Problem List Details Patient Name: [name] Date of Service: 05/22/2016 2:30 PM Medical Record Number: [id number] Patient Account Number: [id number] Date of Birth/Sex: Nov 11, 1970 (46 y.o. Male) Treating RN: [name] Primary Care Provider: PATIENT, NO Other Clinician: Referring Provider: Treating Provider/Extender: [name] in Treatment: 39 Active Problems ICD-10 Encounter Code Description Active Date Diagnosis L89.323 Pressure ulcer of left buttock, stage 3 03/23/2016 Yes G82.22 Paraplegia, incomplete 08/17/2015 Yes Inactive Problems Resolved Problems ICD-10 Code Description Active Date Resolved Date L89.322 Pressure ulcer of left buttock, stage 2 02/18/2016 02/18/2016 L89.153 Pressure ulcer of sacral region, stage 3 08/17/2015 08/17/2015 Electronic Signature(s) Signed: 05/22/2016 3:15:18 PM By: Matthew Kanner MD, FACS Entered By: [name] on 05/22/2016 15:15:18 [name] ([id number]) -------------------------------------------------------------------------------- Progress Note Details Patient Name: [name] Date of Service: 05/22/2016 2:30 PM Medical Record Number: [id number] Patient Account Number: [id number] Date of Birth/Sex: December 02, 1970 (46 y.o.  Male) Treating RN: [name] Primary Care Provider: PATIENT, NO Other Clinician: Referring Provider: Treating Provider/Extender: [name] in Treatment: 39 Subjective Chief Complaint Information obtained from Patient Mr. Kirwan presents today, after missing last week's appointment, for follow-up on his left buttock stage III pressure ulcer. 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 He was diagnosed with what sounds like a stage III pressure ulcer in September 2016 although this may have been present for some time longer than that perhaps July 2016. He was followed by wound care in [place]. By his description there were using Silvadene cream for a period of time and then 3 months ago change to Aquacel Ag which she is continued and changes daily with help from his mother. He has recently relocated to [facility]. He went to urgent care to follow- up on his wound on 08/04/2015 they did a culture of this wound that showed Pseudomonas and MSSA. He was placed on an antibiotic but doesn't remember which one it is. He has continued using Aquacel Ag and a foam based cover. I don't have any of his records from the wound care center in [place] although the patient states he did have an MRI that did not show osteomyelitis. He is not aware of having a bone culture done and doesn't think that he had exposed bone on this and any point. The patient does in and out cath's and has no urinary incontinence issues. He is on appropriate bowel regimen which she administers suppositories. He is able to position himself appropriately and is wheelchair to try and avoid pressure over this wound 08/24/15; small horizontal wound over his lower sacral area. Requires debridement of surface slough and nonviable subcutaneous tissue. There is a small amount of  undermining noted no evidence of infection. On the right side of this wound there is some senescent rolled edges which may need debridement I did not do this today. 09/01/15 this is a small wound over the lower sacral area. It has tunneling superiorly. This goes roughly 1 cm. Nonviable subcutaneous tissue at the inferior aspect of the wound base. No real evidence of infection 09/07/15; small horizontal wound over the lower sacral area. Last week it had 1 cm of superior tunneling which appears to of resolved. No evidence of infection 09/21/15; continues  to have a small horizontal wound over the lower sacral area. We have been using Prisma and this has contracted. He tells me that his mother is aware medication not able to change the dressing although he states that he is able to do this 09/28/15; a small horizontal wound over his lower sacral area is all that is left of this wound which was probably much larger and deeper at one point. Depth today at 0.7 cm. Base of this had some nonviable subcutaneous tissue that I removed. Appears to be stalling on current dressing which is Prisma 10/19/15; I have not seen this patient in several weeks. He arrives today with a linear/horizontal wound across his lower sacral area. Considerable depth of this wound. Extensive debridement done 11/17/15; patient arrives for two-week visit. He is not doing well wound is bigger with odor and drainage. Has DERREL, MOORE ([id number]) been using Aquacel Ag. I have not seen this in over a month although I think he was seen by the physician covering me 2 weeks ago on 11/05/15 11/24/15; I brought this man back this week to review the wound after last week's deterioration. Culture grew Enterobacter and some staph aureus [MSSA]. I had ordered him Cipro but we don't seem to of been able to communicate with the patient therefore we've called in today. He is not been systemically unwell. Still using Aquacel 12/15/15; very little change  in this wound since the last time I saw it. Horizontal wound over the lower sacral area probably about 2 cm in width and 0.8 cm in depth. There is no evidence of infection. There appears to be callus around this. In spite of the patient's best efforts I think there is an adequate pressure relief here. No current evidence of infection 12/22/15; horizontal pressure ulcer over the lower sacral area. Appears to be filling in and has better dimensions in appearance today. Still circumferential callus which would suggest inadequate offloading although everything else appears to be improved 12/29/15 horizontal pressure wound over the lower sacral area. Appears to be filling in however skin groin down into the divot. No evidence of infection 01/05/16; comes in today with not much change in this wound. There is surrounding callus indicative of pressure. Not much change in the wound bed. This clearly require debridement today we have been using Hydrofera Blue 02/04/16: returns today after an absence. no systemic s/s of infection. once again, periwound callus is present suggesting pressure. 02/18/2016 -- she was away for 2 weeks traveling to [place] and during that time he had a new pressure injury to the left ischial tuberosity which he thinks may have been caused while transferring. 03-23-2016; Mr. Disano returns today, after missing last week's appointment, for evaluation of his left buttock stage III pressure ulcer. There is no ulceration to the left ischial tuberosity at this appointment. He states that he has been changing the dressing to his ulcer daily due to daily showering. He voices no complaints or concerns and denies benign antibiotic therapy for any reason at this point. 03/30/2016 -- the patient has been doing fine and after debridement of his wound today I believe is ready for a skin substitute and he has been previously approved for grafix and we will try and obtain this from  next week. 04/06/2016 -- the patient's insurance was run again and he had $275 copayment and he has declined the skin substitute. 04/12/16 patient arrived on the wrong day and as he drives from [place] we fit him in the  scheduled for review. 05/08/2016 -- I have not seen him for the last 2 weeks but he is doing his dressing diligently and says everything else is doing fine Objective Constitutional Pulse regular. Respirations normal and unlabored. Afebrile. Vitals Time Taken: 2:52 PM, Height: 67 in, Weight: 147 lbs, BMI: 23, Temperature: 97.7 F, Pulse: 95 bpm, Respiratory Rate: 18 breaths/min, Blood Pressure: 126/83 mmHg. Matthew EvangelistHARRIS, [name] ([id number]) Eyes Nonicteric. Reactive to light. Ears, Nose, Mouth, and Throat Lips, teeth, and gums WNL.Marland Kitchen. Moist mucosa without lesions. Neck supple and nontender. No palpable supraclavicular or cervical adenopathy. Normal sized without goiter. Respiratory WNL. No retractions.. Breath sounds WNL, No rubs, rales, rhonchi, or wheeze.. Cardiovascular Heart rhythm and rate regular, no murmur or gallop.. Pedal Pulses WNL. No clubbing, cyanosis or edema. Chest Breasts symmetical and no nipple discharge.. Breast tissue WNL, no masses, lumps, or tenderness.. Lymphatic No adneopathy. No adenopathy. No adenopathy. Musculoskeletal Adexa without tenderness or enlargement.. Digits and nails w/o clubbing, cyanosis, infection, petechiae, ischemia, or inflammatory conditions.Marland Kitchen. Psychiatric Judgement and insight Intact.. No evidence of depression, anxiety, or agitation.. General Notes: the patient had a lot of eschar covering the wound and after sharply debriding the eschar and some of the subcutaneous tissue the wound looks excellent Integumentary (Hair, Skin) No suspicious lesions. No crepitus or fluctuance. No peri-wound warmth or erythema. No masses.. Wound #1 status is Open. Original cause of wound was Pressure Injury. The wound is located on the  Left Sacrum. The wound measures 0.2cm length x 0.7cm width x 0.1cm depth; 0.11cm^2 area and 0.011cm^3 volume. There is Fat Layer (Subcutaneous Tissue) Exposed exposed. There is no tunneling or undermining noted. There is a large amount of serous drainage noted. The wound margin is epibole. There is large (67- 100%) pink, pale granulation within the wound bed. There is a small (1-33%) amount of necrotic tissue within the wound bed including Eschar and Adherent Slough. The periwound skin appearance exhibited: Callus. The periwound skin appearance did not exhibit: Dry/Scaly, Maceration. Periwound temperature was noted as No Abnormality. Assessment Matthew EvangelistHARRIS, [name] ([id number]) Active Problems ICD-10 [phone] - Pressure ulcer of left buttock, stage 3 G82.22 - Paraplegia, incomplete Procedures Wound #1 Wound #1 is a Pressure Ulcer located on the Left Sacrum . There was a Skin/Subcutaneous Tissue Debridement ([id number]) debridement with total area of 0.14 sq cm performed by Matthew KannerBritto, Valiant Dills, MD. with the following instrument(s): Curette to remove Viable and Non-Viable tissue/material including Exudate, Fibrin/Slough, and Subcutaneous after achieving pain control using Lidocaine 4% Topical Solution. A time out was conducted at 15:04, prior to the start of the procedure. A Minimum amount of bleeding was controlled with Pressure. The procedure was tolerated well with a pain level of 0 throughout and a pain level of 0 following the procedure. Post Debridement Measurements: 0.2cm length x 0.4cm width x 0.2cm depth; 0.013cm^3 volume. Post debridement Stage noted as Category/Stage III. Character of Wound/Ulcer Post Debridement requires further debridement. Severity of Tissue Post Debridement is: Fat layer exposed. Post procedure Diagnosis Wound #1: Same as Pre-Procedure Plan Wound Cleansing: Wound #1 Left Sacrum: Clean wound with Normal Saline. Anesthetic: Wound #1 Left Sacrum: Topical Lidocaine 4%  cream applied to wound bed prior to debridement Skin Barriers/Peri-Wound Care: Wound #1 Left Sacrum: Barrier cream Primary Wound Dressing: Wound #1 Left Sacrum: Other: - endoform Secondary Dressing: Wound #1 Left Sacrum: Foam Tegaderm Dressing Change Frequency: Matthew EvangelistHARRIS, [name] ([id number]) Wound #1 Left Sacrum: Other: - twice weekly Follow-up Appointments: Wound #1 Left Sacrum: Return Appointment in 1 week. Off-Loading: Wound #1  Left Sacrum: Roho cushion for wheelchair Turn and reposition every 2 hours We will continue offloading, protein supplements, vitamin A, vitamin C and zinc have been discussed with him in great detail. He will use Endoform on alternate days and use a offloading foam as planned. Electronic Signature(s) Signed: 05/22/2016 4:20:08 PM By: Matthew Kanner MD, FACS Previous Signature: 05/22/2016 3:17:10 PM Version By: Matthew Kanner MD, FACS Entered By: [name] on 05/22/2016 16:20:08 ELIM, ECONOMOU ([id number]) -------------------------------------------------------------------------------- SuperBill Details Patient Name: [name] Date of Service: 05/22/2016 Medical Record Number: [id number] Patient Account Number: [id number] Date of Birth/Sex: 1971-01-25 (46 y.o. Male) Treating RN: [name] Primary Care Provider: PATIENT, NO Other Clinician: Referring Provider: Treating Provider/Extender: [name] in Treatment: 39 Diagnosis Coding ICD-10 Codes Code Description [phone] Pressure ulcer of left buttock, stage 3 G82.22 Paraplegia, incomplete Facility Procedures CPT4 Code: [id number] Description: 11042 - DEB SUBQ TISSUE 20 SQ CM/< ICD-10 Description Diagnosis G82.22 Paraplegia, incomplete L89.323 Pressure ulcer of left buttock, stage 3 Modifier: Quantity: 1 Physician Procedures CPT4 Code: [id number] Description: 11042 - WC PHYS SUBQ TISS 20 SQ CM ICD-10 Description Diagnosis G82.22 Paraplegia, incomplete L89.323 Pressure ulcer of  left buttock, stage 3 Modifier: Quantity: 1 Electronic Signature(s) Signed: 05/22/2016 3:17:20 PM By: Matthew Kanner MD, FACS Entered By: [name] on 05/22/2016 15:17:20

## 2016-05-22 NOTE — Progress Notes (Signed)
GUNTHER, ZAWADZKI ([id number]) Visit Report for 05/22/2016 Arrival Information Details Patient Name: [name], [name] Date of Service: 05/22/2016 2:30 PM Medical Record Number: [id number] Patient Account Number: [id number] Date of Birth/Sex: 1970-08-12 (46 y.o. Male) Treating RN: Phillis Haggis Primary Care Mulki Roesler: PATIENT, NO Other Clinician: Referring Derika Eckles: Treating Jonty Morrical/Extender: Rudene Re in Treatment: 29 Visit Information History Since Last Visit All ordered tests and consults were completed: No Patient Arrived: Wheel Chair Added or deleted any medications: No Arrival Time: 14:51 Any new allergies or adverse reactions: No Accompanied By: self Had a fall or experienced change in No activities of daily living that may affect Transfer Assistance: None risk of falls: Patient Identification Verified: Yes Signs or symptoms of abuse/neglect since last No Secondary Verification Process Yes visito Completed: Hospitalized since last visit: No Patient Requires Transmission-Based No Has Dressing in Place as Prescribed: Yes Precautions: Pain Present Now: No Patient Has Alerts: No Electronic Signature(s) Signed: 05/22/2016 4:23:36 PM By: Alejandro Mulling Entered By: Alejandro Mulling on 05/22/2016 14:51:43 [name] ([id number]) -------------------------------------------------------------------------------- Encounter Discharge Information Details Patient Name: [name] Date of Service: 05/22/2016 2:30 PM Medical Record Number: [id number] Patient Account Number: [id number] Date of Birth/Sex: 12-28-1970 (46 y.o. Male) Treating RN: Phillis Haggis Primary Care Qunicy Higinbotham: PATIENT, NO Other Clinician: Referring Jared Cahn: Treating Kamran Coker/Extender: Rudene Re in Treatment: 31 Encounter Discharge Information Items Discharge Pain Level: 0 Discharge Condition: Stable Ambulatory Status: Wheelchair Discharge Destination: Home Transportation: Private  Auto Accompanied By: self Schedule Follow-up Appointment: Yes Medication Reconciliation completed No and provided to Patient/Care Patience Nuzzo: Patient Clinical Summary of Care: Declined Electronic Signature(s) Signed: 05/22/2016 3:14:23 PM By: Gwenlyn Perking Entered By: Gwenlyn Perking on 05/22/2016 15:14:23 [name] ([id number]) -------------------------------------------------------------------------------- Lower Extremity Assessment Details Patient Name: [name] Date of Service: 05/22/2016 2:30 PM Medical Record Number: [id number] Patient Account Number: [id number] Date of Birth/Sex: 03-31-1971 (46 y.o. Male) Treating RN: Phillis Haggis Primary Care Marika Mahaffy: PATIENT, NO Other Clinician: Referring Peyton Spengler: Treating Ainslee Sou/Extender: Rudene Re in Treatment: 39 Electronic Signature(s) Signed: 05/22/2016 4:23:36 PM By: Alejandro Mulling Entered By: Alejandro Mulling on 05/22/2016 14:58:19 [name] ([id number]) -------------------------------------------------------------------------------- Multi Wound Chart Details Patient Name: [name] Date of Service: 05/22/2016 2:30 PM Medical Record Number: [id number] Patient Account Number: [id number] Date of Birth/Sex: [birth year] (46 y.o. Male) Treating RN: Phillis Haggis Primary Care Leona Alen: PATIENT, NO Other Clinician: Referring Kienan Doublin: Treating Adriannah Steinkamp/Extender: Rudene Re in Treatment: 39 Vital Signs Height(in): 67 Pulse(bpm): 95 Weight(lbs): 147 Blood Pressure 126/83 (mmHg): Body Mass Index(BMI): 23 Temperature(F): 97.7 Respiratory Rate 18 (breaths/min): Photos: [N/A:N/A] Wound Location: Left Sacrum N/A N/A Wounding Event: Pressure Injury N/A N/A Primary Etiology: Pressure Ulcer N/A N/A Comorbid History: Paraplegia N/A N/A Date Acquired: 12/24/2014 N/A N/A Weeks of Treatment: 39 N/A N/A Wound Status: Open N/A N/A Measurements L x W x D 0.2x0.7x0.1 N/A N/A (cm) Area (cm)  : 0.11 N/A N/A Volume (cm) : 0.011 N/A N/A % Reduction in Area: 89.60% N/A N/A % Reduction in Volume: 97.90% N/A N/A Classification: Category/Stage III N/A N/A Exudate Amount: Large N/A N/A Exudate Type: Serous N/A N/A Exudate Color: amber N/A N/A Wound Margin: Epibole N/A N/A Granulation Amount: Large (67-100%) N/A N/A Granulation Quality: Pink, Pale N/A N/A Necrotic Amount: Small (1-33%) N/A N/A Necrotic Tissue: Eschar, Adherent Slough N/A N/A MARQUESE, BURKLAND ([id number]) Exposed Structures: Fat Layer (Subcutaneous N/A N/A Tissue) Exposed: Yes Fascia: No Tendon: No Muscle: No Joint: No Bone: No Epithelialization: None N/A N/A Debridement: Debridement (01601- N/A N/A 11047) Pre-procedure 15:04 N/A N/A Verification/Time Out Taken:  Pain Control: Lidocaine 4% Topical N/A N/A Solution Tissue Debrided: Fibrin/Slough, Exudates, N/A N/A Subcutaneous Level: Skin/Subcutaneous N/A N/A Tissue Debridement Area (sq 0.14 N/A N/A cm): Instrument: Curette N/A N/A Bleeding: Minimum N/A N/A Hemostasis Achieved: Pressure N/A N/A Procedural Pain: 0 N/A N/A Post Procedural Pain: 0 N/A N/A Debridement Treatment Procedure was tolerated N/A N/A Response: well Post Debridement 0.2x0.7x0.2 N/A N/A Measurements L x W x D (cm) Post Debridement 0.022 N/A N/A Volume: (cm) Post Debridement Category/Stage III N/A N/A Stage: Periwound Skin Texture: Callus: Yes N/A N/A Periwound Skin Maceration: No N/A N/A Moisture: Dry/Scaly: No Periwound Skin Color: No Abnormalities Noted N/A N/A Temperature: No Abnormality N/A N/A Tenderness on No N/A N/A Palpation: Wound Preparation: Ulcer Cleansing: N/A N/A Rinsed/Irrigated with Saline Topical Anesthetic Applied: Other: lidocaine 4% Procedures Performed: Debridement N/A N/A Matthew EvangelistHARRIS, Marcelle ([id number]) Treatment Notes Wound #1 (Left Sacrum) 1. Cleansed with: Clean wound with Normal Saline 2. Anesthetic Topical Lidocaine 4% cream to wound  bed prior to debridement 3. Peri-wound Care: Skin Prep 4. Dressing Applied: Other dressing (specify in notes) 5. Secondary Dressing Applied Foam Tegaderm Notes endoform Electronic Signature(s) Signed: 05/22/2016 3:15:27 PM By: Evlyn KannerBritto, Errol MD, FACS Entered By: Evlyn KannerBritto, Errol on 05/22/2016 15:15:27 Matthew EvangelistHARRIS, Okie ([id number]) -------------------------------------------------------------------------------- Multi-Disciplinary Care Plan Details Patient Name: Matthew EvangelistHARRIS, Nehal Date of Service: 05/22/2016 2:30 PM Medical Record Number: [id number] Patient Account Number: [id number] Date of Birth/Sex: 12/07/1970 [age] y.o. Male) Treating RN: Phillis HaggisPinkerton, Debi Primary Care Okley Magnussen: PATIENT, NO Other Clinician: Referring Yakelin Grenier: Treating Lajoyce Tamura/Extender: Rudene ReBritto, Errol Weeks in Treatment: 239 Active Inactive ` Abuse / Safety / Falls / Self Care Management Nursing Diagnoses: Potential for falls Goals: Patient will remain injury free Date Initiated: 08/17/2015 Target Resolution Date: 07/22/2016 Goal Status: Active Interventions: Assess fall risk on admission and as needed Notes: ` Nutrition Nursing Diagnoses: Imbalanced nutrition Goals: Patient/caregiver agrees to and verbalizes understanding of need to use nutritional supplements and/or vitamins as prescribed Date Initiated: 08/17/2015 Target Resolution Date: 07/22/2016 Goal Status: Active Interventions: Assess patient nutrition upon admission and as needed per policy Notes: ` Orientation to the Wound Care Program Nursing Diagnoses: Knowledge deficit related to the wound healing center program Matthew EvangelistHARRIS, Ethon ([id number]) Goals: Patient/caregiver will verbalize understanding of the Wound Healing Center Program Date Initiated: 08/17/2015 Target Resolution Date: 07/22/2016 Goal Status: Active Interventions: Provide education on orientation to the wound center Notes: ` Pressure Nursing Diagnoses: Knowledge deficit related to  causes and risk factors for pressure ulcer development Goals: Patient will remain free from development of additional pressure ulcers Date Initiated: 08/17/2015 Target Resolution Date: 07/22/2016 Goal Status: Active Interventions: Assess: immobility, friction, shearing, incontinence upon admission and as needed Notes: ` Wound/Skin Impairment Nursing Diagnoses: Impaired tissue integrity Goals: Ulcer/skin breakdown will have a volume reduction of 30% by week 4 Date Initiated: 08/17/2015 Target Resolution Date: 07/22/2016 Goal Status: Active Ulcer/skin breakdown will have a volume reduction of 50% by week 8 Date Initiated: 08/17/2015 Target Resolution Date: 07/22/2016 Goal Status: Active Ulcer/skin breakdown will have a volume reduction of 80% by week 12 Date Initiated: 08/17/2015 Target Resolution Date: 07/22/2016 Goal Status: Active Interventions: Assess patient/caregiver ability to obtain necessary supplies Assess ulceration(s) every visit Matthew EvangelistHARRIS, Breyon ([id number]) Notes: Electronic Signature(s) Signed: 05/22/2016 4:23:36 PM By: Alejandro MullingPinkerton, Debra Entered By: Alejandro MullingPinkerton, Debra on 05/22/2016 15:02:02 Matthew EvangelistHARRIS, Mckinnon ([id number]) -------------------------------------------------------------------------------- Pain Assessment Details Patient Name: Matthew EvangelistHARRIS, Davison Date of Service: 05/22/2016 2:30 PM Medical Record Number: [id number] Patient Account Number: [id number] Date of Birth/Sex: 12/07/1970 [age] y.o. Male) Treating RN: Phillis HaggisPinkerton, Debi Primary Care Jeshurun Oaxaca:  PATIENT, NO Other Clinician: Referring Ladean Steinmeyer: Treating Micki Cassel/Extender: Rudene Re in Treatment: 39 Active Problems Location of Pain Severity and Description of Pain Patient Has Paino No Site Locations With Dressing Change: No Pain Management and Medication Current Pain Management: Electronic Signature(s) Signed: 05/22/2016 4:23:36 PM By: Alejandro Mulling Entered By: Alejandro Mulling on 05/22/2016  14:52:08 [name] ([id number]) -------------------------------------------------------------------------------- Patient/Caregiver Education Details Patient Name: [name] Date of Service: 05/22/2016 2:30 PM Medical Record Number: [id number] Patient Account Number: [id number] Date of Birth/Gender: [birth year] (46 y.o. Male) Treating RN: Phillis Haggis Primary Care Physician: PATIENT, NO Other Clinician: Referring Physician: Treating Physician/Extender: Rudene Re in Treatment: 85 Education Assessment Education Provided To: Patient Education Topics Provided Wound/Skin Impairment: Handouts: Other: change dressing as ordered Methods: Demonstration, Explain/Verbal Responses: State content correctly Electronic Signature(s) Signed: 05/22/2016 4:23:36 PM By: Alejandro Mulling Entered By: Alejandro Mulling on 05/22/2016 15:01:23 [name] ([id number]) -------------------------------------------------------------------------------- Wound Assessment Details Patient Name: [name] Date of Service: 05/22/2016 2:30 PM Medical Record Number: [id number] Patient Account Number: [id number] Date of Birth/Sex: 11-06-1970 (46 y.o. Male) Treating RN: Phillis Haggis Primary Care Annleigh Knueppel: PATIENT, NO Other Clinician: Referring Tu Shimmel: Treating Ladarion Munyon/Extender: Rudene Re in Treatment: 39 Wound Status Wound Number: 1 Primary Etiology: Pressure Ulcer Wound Location: Left Sacrum Wound Status: Open Wounding Event: Pressure Injury Comorbid History: Paraplegia Date Acquired: 12/24/2014 Weeks Of Treatment: 39 Clustered Wound: No Photos Wound Measurements Length: (cm) 0.2 Width: (cm) 0.7 Depth: (cm) 0.1 Area: (cm) 0.11 Volume: (cm) 0.011 % Reduction in Area: 89.6% % Reduction in Volume: 97.9% Epithelialization: None Tunneling: No Undermining: No Wound Description Classification: Category/Stage III Foul Odor After Wound Margin: Epibole Exudate  Amount: Large Exudate Type: Serous Exudate Color: amber Cleansing: No Wound Bed Granulation Amount: Large (67-100%) Exposed Structure Granulation Quality: Pink, Pale Fascia Exposed: No Necrotic Amount: Small (1-33%) Fat Layer (Subcutaneous Tissue) Exposed: Yes Necrotic Quality: Eschar, Adherent Slough Tendon Exposed: No Muscle Exposed: No LYNELL, KUSSMAN ([id number]) Joint Exposed: No Bone Exposed: No Periwound Skin Texture Texture Color No Abnormalities Noted: No No Abnormalities Noted: No Callus: Yes Temperature / Pain Moisture Temperature: No Abnormality No Abnormalities Noted: No Dry / Scaly: No Maceration: No Wound Preparation Ulcer Cleansing: Rinsed/Irrigated with Saline Topical Anesthetic Applied: Other: lidocaine 4%, Treatment Notes Wound #1 (Left Sacrum) 1. Cleansed with: Clean wound with Normal Saline 2. Anesthetic Topical Lidocaine 4% cream to wound bed prior to debridement 3. Peri-wound Care: Skin Prep 4. Dressing Applied: Other dressing (specify in notes) 5. Secondary Dressing Applied Foam Tegaderm Notes endoform Electronic Signature(s) Signed: 05/22/2016 4:23:36 PM By: Alejandro Mulling Entered By: Alejandro Mulling on 05/22/2016 15:16:26 KARA, MIERZEJEWSKI ([id number]) -------------------------------------------------------------------------------- Vitals Details Patient Name: [name] Date of Service: 05/22/2016 2:30 PM Medical Record Number: [id number] Patient Account Number: [id number] Date of Birth/Sex: [birth year] (46 y.o. Male) Treating RN: Phillis Haggis Primary Care Ransom Nickson: PATIENT, NO Other Clinician: Referring Aarohi Redditt: Treating Xcaret Morad/Extender: Rudene Re in Treatment: 39 Vital Signs Time Taken: 14:52 Temperature (F): 97.7 Height (in): 67 Pulse (bpm): 95 Weight (lbs): 147 Respiratory Rate (breaths/min): 18 Body Mass Index (BMI): 23 Blood Pressure (mmHg): 126/83 Reference Range: 80 - 120 mg / dl Electronic  Signature(s) Signed: 05/22/2016 4:23:36 PM By: Alejandro Mulling Entered By: Alejandro Mulling on 05/22/2016 14:52:31

## 2016-05-23 ENCOUNTER — Ambulatory Visit: Payer: Medicare Other | Admitting: Physical Therapy

## 2016-05-23 ENCOUNTER — Ambulatory Visit (INDEPENDENT_AMBULATORY_CARE_PROVIDER_SITE_OTHER): Payer: Medicare Other | Admitting: Psychology

## 2016-05-23 DIAGNOSIS — F3132 Bipolar disorder, current episode depressed, moderate: Secondary | ICD-10-CM | POA: Diagnosis not present

## 2016-05-24 ENCOUNTER — Encounter: Payer: Self-pay | Admitting: Physical Medicine & Rehabilitation

## 2016-05-24 ENCOUNTER — Encounter (HOSPITAL_BASED_OUTPATIENT_CLINIC_OR_DEPARTMENT_OTHER): Payer: Worker's Compensation | Admitting: Physical Medicine & Rehabilitation

## 2016-05-24 VITALS — BP 112/78 | HR 90

## 2016-05-24 DIAGNOSIS — M62838 Other muscle spasm: Secondary | ICD-10-CM

## 2016-05-24 DIAGNOSIS — R252 Cramp and spasm: Secondary | ICD-10-CM

## 2016-05-24 DIAGNOSIS — N319 Neuromuscular dysfunction of bladder, unspecified: Secondary | ICD-10-CM | POA: Diagnosis not present

## 2016-05-24 DIAGNOSIS — R293 Abnormal posture: Secondary | ICD-10-CM

## 2016-05-24 DIAGNOSIS — Z5189 Encounter for other specified aftercare: Secondary | ICD-10-CM | POA: Diagnosis not present

## 2016-05-24 DIAGNOSIS — G479 Sleep disorder, unspecified: Secondary | ICD-10-CM

## 2016-05-24 DIAGNOSIS — G8220 Paraplegia, unspecified: Secondary | ICD-10-CM | POA: Diagnosis not present

## 2016-05-24 DIAGNOSIS — K592 Neurogenic bowel, not elsewhere classified: Secondary | ICD-10-CM | POA: Diagnosis not present

## 2016-05-24 DIAGNOSIS — M25551 Pain in right hip: Secondary | ICD-10-CM

## 2016-05-24 DIAGNOSIS — G8222 Paraplegia, incomplete: Secondary | ICD-10-CM

## 2016-05-24 MED ORDER — BACLOFEN 10 MG PO TABS: 30.0000 mg | ORAL_TABLET | Freq: Three times a day (TID) | ORAL | 1 refills | Status: DC

## 2016-05-24 MED ORDER — TIZANIDINE HCL 2 MG PO TABS: 2.0000 mg | ORAL_TABLET | Freq: Three times a day (TID) | ORAL | 0 refills | Status: DC | PRN

## 2016-05-24 MED ORDER — SIMETHICONE 180 MG PO CAPS: 180.0000 mg | ORAL_CAPSULE | Freq: Two times a day (BID) | ORAL | 1 refills | Status: DC

## 2016-05-24 NOTE — Progress Notes (Signed)
Subjective:    Patient ID: [name], male    DOB: 02-16-1971, 46 y.o.   MRN: [id number]  HPI  46 y/o male with pmh of T8 SCI 2007 and psh of baclofen pump removal in 04/2013 presents for follow up of for management of Matthew SCI.  Matthew main issues were Matthew postures and asymmetrical weakness and spasms.  Pt had Matthew ITB pump removed due to inconvenience and stressors of withdrawal due to pump failure. He was living [place] and relocated to [name].  He is looking to establish care as well.  He has never had AD.  He does AM bowel program.  No movement or sensation below T8. He is independent, lives with [name].  For spasms, he has tried oral baclofen 20mg  TID, but made him lethargic.  He took valium, which he usually took at night.    Last clinic visit 04/26/16.  At that time, Baclofen was increased, which helped.  He is going to PT.  He had xrays, which showed degenerative changes of L-spine.  Matthew gas has improved with the charcoal, which he is taking OTC.  He could not tolerate senna.  Overall, he is doing a little better. He is still awaiting Matthew new chair.   Pain Inventory Average Pain 7 Pain Right Now 5 My pain is sharp, burning, stabbing and aching  In the last 24 hours, has pain interfered with the following? General activity 8 Relation with others 8 Enjoyment of life 8 What TIME of day is your pain at its worst? morning, night Sleep (in general) Poor  Pain is worse with: walking, bending, sitting, inactivity, standing and some activites Pain improves with: rest and therapy/exercise Relief from Meds: 3  Mobility do you drive?  yes use a wheelchair Do you have any goals in this area?  no  Function disabled: date disabled 02/28/2006 I need assistance with the following:  household duties Do you have any goals in this area?  no  Neuro/Psych bladder control problems weakness numbness tremor tingling spasms depression  Prior Studies Any changes since last visit?   no  Physicians involved in your care Any changes since last visit?  no   History reviewed. No pertinent family history. Social History   Social History  . Marital status: Divorced    Spouse name: N/A  . Number of children: N/A  . Years of education: N/A   Social History Main Topics  . Smoking status: Current Every Day Smoker    Packs/day: 0.50    Types: Cigarettes  . Smokeless tobacco: Current User  . Alcohol use 0.0 oz/week  . Drug use: Unknown  . Sexual activity: Not Asked   Other Topics Concern  . None   Social History Narrative  . None   Past Surgical History:  Procedure Laterality Date  . PROGRAMABLE BACLOFEN PUMP REVISION Left 2014   Removal    Past Medical History:  Diagnosis Date  . Thoracic spinal cord injury (HCC)    BP 112/78   Pulse 90   SpO2 94%   Opioid Risk Score:   Fall Risk Score:  `1  Depression screen PHQ 2/9  Depression screen PHQ 2/9 10/28/2015  Decreased Interest 1  Down, Depressed, Hopeless 1  PHQ - 2 Score 2  Altered sleeping 2  Tired, decreased energy 1  Change in appetite 0  Feeling bad or failure about yourself  1  Trouble concentrating 1  Moving slowly or fidgety/restless 1  Suicidal thoughts 0  PHQ-9 Score 8  Difficult doing work/chores Somewhat difficult    Review of Systems  Constitutional: Positive for diaphoresis.       Bladder control problems   HENT: Negative.   Eyes: Negative.   Respiratory: Negative.   Cardiovascular: Negative.   Gastrointestinal: Positive for abdominal pain.  Endocrine: Negative.   Genitourinary: Positive for difficulty urinating.  Musculoskeletal:       Spasms   Skin: Negative.   Allergic/Immunologic: Negative.   Neurological: Positive for tremors, weakness and numbness.       Tingling   Hematological: Negative.   Psychiatric/Behavioral: Positive for dysphoric mood.  All other systems reviewed and are negative.     Objective:   Physical Exam Gen: NAD. Vital signs  reviewed HENT: Normocephalic, Atraumatic Eyes: EOMI. No discharge.  Cardio: RRR. No JVD.  Pulm: B/l clear to auscultation.  Effort nornmal Abd: Soft. BS+ MSK:  Gait nonambulatory.   No TTP.    No edema. Neuro: Sensation absent below T8 dermatomes  Strength  5/5 in all UE myotomes    0/5 in all LE myotomes  Spasms in B/l LE, making it difficult to assess spasticity, most pronounced in adductors Skin: Warm and Dry, ulcer left sacral area (continues to improve)    Assessment & Plan:  46 y/o male with pmh of T8 SCI 2007 and psh of baclofen pump removal in 04/2013 presents for follow up of for management of Matthew SCI.  1. T8 Paraplegia  Encouraged pt to follow up with PT for revaluation, postural exercises (He states he is in the process of getting Matthew chair fixed and then getting eval).  Pt went x1 and is scheduled to go back, notes reviewed.  Cont ROM, stretching  Cont follow up with Psychology   UDS +THC previously   Will follow for pt to be involved as a Dance movement psychotherapist at hospital - discussed with pt and he is willing and eager to assist, still has not received a call back, spoke to hospital team.    2. Neurogenic Bladder  Cont to follow with Urology  3. Neurogenic bowel  Cont bowel reg with suppository  4. Spasticity/Spasms   Had ITB pump, but did not want to keep due to convenience and stressors of withdrawals  Valium ineffective, pt does not want to go back to addictive meds  Cont Baclofen 30 TID  Will add tizanidine 2mg  TID PRN  Likely exacerbated by gas and ulcers  5. Abnormal posture  Cont PT for postural stabilization and seating evaluation once pt has permanent chair  Xrays reviewed, showing degenerative changes in L-spine   6. Left lower back open wound  Cont to follow with wound care with dressing changes  Improving, almost healed  7. Sleep disturbance  See #1  8. Gas  Simethicone ineffective  No able to tolerate Senna  Xray reviewed, normal bowel/gas patters  ?SMA,  will cont to evaluate, however, appears less likely at this point given lack of other symptoms  Will consider FES  9. Right hip pain  Xray reviewed, ?myositis ossificans, will order ultrasound to further evaluate as this may contribute to spasticitiy

## 2016-05-29 ENCOUNTER — Encounter: Payer: Worker's Compensation | Attending: Surgery | Admitting: Surgery

## 2016-05-29 DIAGNOSIS — L89323 Pressure ulcer of left buttock, stage 3: Secondary | ICD-10-CM | POA: Insufficient documentation

## 2016-05-29 DIAGNOSIS — G8222 Paraplegia, incomplete: Secondary | ICD-10-CM | POA: Diagnosis not present

## 2016-05-29 DIAGNOSIS — L89159 Pressure ulcer of sacral region, unspecified stage: Secondary | ICD-10-CM | POA: Diagnosis not present

## 2016-05-29 NOTE — Progress Notes (Signed)
[name] ([id number]) Visit Report for 05/29/2016 Chief Complaint Document Details Patient Name: [name], [name] Date of Service: 05/29/2016 12:45 PM Medical Record Number: [id number] Patient Account Number: [id number] Date of Birth/Sex: 1970-08-04 (46 y.o. Male) Treating RN: [name] Primary Care Provider: PATIENT, NO Other Clinician: Referring Provider: Treating Provider/Extender: [name] in Treatment: 40 Information Obtained from: Patient Chief Complaint [name] presents today, after missing last week's appointment, for follow-up on his left buttock stage III pressure ulcer. Electronic Signature(s) Signed: 05/29/2016 1:30:51 PM By: Matthew Kanner MD, FACS Entered By: [name] on 05/29/2016 13:30:51 [name] ([id number]) -------------------------------------------------------------------------------- HPI Details Patient Name: [name] Date of Service: 05/29/2016 12:45 PM Medical Record Number: [id number] Patient Account Number: [id number] Date of Birth/Sex: 09-24-1970 (46 y.o. 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 He was diagnosed with what sounds like a stage III pressure ulcer in September 2016 although this may have been present for some time longer than that perhaps July 2016. He was followed by wound care in [place]. By his description there were using Silvadene cream for a period of time and then 3 months ago change to Aquacel Ag which she is continued and changes daily with help from his mother. He has recently relocated to [facility]. He went to urgent care to follow-up on his wound on 08/04/2015 they did a culture of this  wound that showed Pseudomonas and MSSA. He was placed on an antibiotic but doesn't remember which one it is. He has continued using Aquacel Ag and a foam based cover. I don't have any of his records from the wound care center in [place] although the patient states he did have an MRI that did not show osteomyelitis. He is not aware of having a bone culture done and doesn't think that he had exposed bone on this and any point. The patient does in and out cath's and has no urinary incontinence issues. He is on appropriate bowel regimen which she administers suppositories. He is able to position himself appropriately and is wheelchair to try and avoid pressure over this wound 08/24/15; small horizontal wound over his lower sacral area. Requires debridement of surface slough and nonviable subcutaneous tissue. There is a small amount of undermining noted no evidence of infection. On the right side of this wound there is some senescent rolled edges which may need debridement I did not do this today. 09/01/15 this is a small wound over the lower sacral area. It has tunneling superiorly. This goes roughly 1 cm. Nonviable subcutaneous tissue at the inferior aspect of the wound base. No real evidence of infection 09/07/15; small horizontal wound over the lower sacral area. Last week it had 1 cm of superior tunneling which appears to of resolved. No evidence of infection 09/21/15; continues to have a small horizontal wound over the lower sacral area. We have been using Prisma and this has contracted. He tells me that his mother is aware medication not able to change the dressing although he states that he is able to do this 09/28/15; a small horizontal wound over his lower sacral area is all that is left of this wound which was probably much larger and deeper at one point. Depth today at 0.7 cm. Base  of this had some nonviable subcutaneous tissue that I removed. Appears to be stalling on current dressing  which is Prisma 10/19/15; I have not seen this patient in several weeks. He arrives today with a linear/horizontal wound across his lower sacral area. Considerable depth of this wound. Extensive debridement done 11/17/15; patient arrives for two-week visit. He is not doing well wound is bigger with odor and drainage. Has been using Aquacel Ag. I have not seen this in over a month although I think he was seen by the physician covering me 2 weeks ago on 11/05/15 11/24/15; I brought this man back this week to review the wound after last week's deterioration. Culture grew Enterobacter and some staph aureus [MSSA]. I had ordered him Cipro but we don't seem to of been able to communicate with the patient therefore we've called in today. He is not been systemically unwell. Still using Aquacel 12/15/15; very little change in this wound since the last time I saw it. Horizontal wound over the lower sacral [name], [name] ([id number]) area probably about 2 cm in width and 0.8 cm in depth. There is no evidence of infection. There appears to be callus around this. In spite of the patient's best efforts I think there is an adequate pressure relief here. No current evidence of infection 12/22/15; horizontal pressure ulcer over the lower sacral area. Appears to be filling in and has better dimensions in appearance today. Still circumferential callus which would suggest inadequate offloading although everything else appears to be improved 12/29/15 horizontal pressure wound over the lower sacral area. Appears to be filling in however skin groin down into the divot. No evidence of infection 01/05/16; comes in today with not much change in this wound. There is surrounding callus indicative of pressure. Not much change in the wound bed. This clearly require debridement today we have been using Hydrofera Blue 02/04/16: returns today after an absence. no systemic s/s of infection. once again, periwound callus is present  suggesting pressure. 02/18/2016 -- she was away for 2 weeks traveling to [place] and during that time he had a new pressure injury to the left ischial tuberosity which he thinks may have been caused while transferring. 03-23-2016; Mr. Raska returns today, after missing last week's appointment, for evaluation of his left buttock stage III pressure ulcer. There is no ulceration to the left ischial tuberosity at this appointment. He states that he has been changing the dressing to his ulcer daily due to daily showering. He voices no complaints or concerns and denies benign antibiotic therapy for any reason at this point. 03/30/2016 -- the patient has been doing fine and after debridement of his wound today I believe is ready for a skin substitute and he has been previously approved for grafix and we will try and obtain this from next week. 04/06/2016 -- the patient's insurance was run again and he had $275 copayment and he has declined the skin substitute. 12/20/17opatient arrived on the wrong day and as he drives from [place] we fit him in the scheduled for review. 05/08/2016 -- I have not seen him for the last 2 weeks but he is doing his dressing diligently and says everything else is doing fine Electronic Signature(s) Signed: 05/29/2016 1:30:56 PM By: Matthew Kanner MD, FACS Entered By: [name] on 05/29/2016 13:30:56 [name], [name] ([id number]) -------------------------------------------------------------------------------- Physical Exam Details Patient Name: [name] Date of Service: 05/29/2016 12:45 PM Medical Record Number: [id number] Patient Account Number: [id number] Date of Birth/Sex: 05/15/1970 (46 y.o. Male) Treating  RN: [name] Primary Care Provider: PATIENT, NO Other Clinician: Referring Provider: Treating Provider/Extender: [name] in Treatment: 40 Constitutional . Pulse regular. Respirations normal and unlabored. Afebrile. . Eyes Nonicteric.  Reactive to light. Ears, Nose, Mouth, and Throat Lips, teeth, and gums WNL.Marland Kitchen Moist mucosa without lesions. Neck supple and nontender. No palpable supraclavicular or cervical adenopathy. Normal sized without goiter. Respiratory WNL. No retractions.. Breath sounds WNL, No rubs, rales, rhonchi, or wheeze.. Cardiovascular Heart rhythm and rate regular, no murmur or gallop.. Pedal Pulses WNL. No clubbing, cyanosis or edema. Chest Breasts symmetical and no nipple discharge.. Breast tissue WNL, no masses, lumps, or tenderness.. Lymphatic No adneopathy. No adenopathy. No adenopathy. Musculoskeletal Adexa without tenderness or enlargement.. Digits and nails w/o clubbing, cyanosis, infection, petechiae, ischemia, or inflammatory conditions.. Integumentary (Hair, Skin) No suspicious lesions. No crepitus or fluctuance. No peri-wound warmth or erythema. No masses.Marland Kitchen Psychiatric Judgement and insight Intact.. No evidence of depression, anxiety, or agitation.. Notes the wound is healed and I gently removed some of the eschar surrounding it and there is no open wound. Electronic Signature(s) Signed: 05/29/2016 1:31:24 PM By: Matthew Kanner MD, FACS Entered By: [name] on 05/29/2016 13:31:24 [name], [name] ([id number]) -------------------------------------------------------------------------------- Physician Orders Details Patient Name: [name] Date of Service: 05/29/2016 12:45 PM Medical Record Number: [id number] Patient Account Number: [id number] Date of Birth/Sex: 1970-09-20 (46 y.o. Male) Treating RN: [name] Primary Care Provider: PATIENT, NO Other Clinician: Referring Provider: Treating Provider/Extender: [name] in Treatment: 2 Verbal / Phone Orders: No Diagnosis Coding Discharge From [facility] o Discharge from Wound Care Center - Treatment complete Electronic Signature(s) Signed: 05/29/2016 1:31:36 PM By: Matthew Kanner MD, FACS Entered By: [name] on  05/29/2016 13:31:36 [name] ([id number]) -------------------------------------------------------------------------------- Problem List Details Patient Name: [name] Date of Service: 05/29/2016 12:45 PM Medical Record Number: [id number] Patient Account Number: [id number] Date of Birth/Sex: 11/01/1970 (46 y.o. Male) Treating RN: [name] Primary Care Provider: PATIENT, NO Other Clinician: Referring Provider: Treating Provider/Extender: [name] in Treatment: 40 Active Problems ICD-10 Encounter Code Description Active Date Diagnosis L89.323 Pressure ulcer of left buttock, stage 3 03/23/2016 Yes G82.22 Paraplegia, incomplete 08/17/2015 Yes Inactive Problems Resolved Problems ICD-10 Code Description Active Date Resolved Date L89.322 Pressure ulcer of left buttock, stage 2 02/18/2016 02/18/2016 L89.153 Pressure ulcer of sacral region, stage 3 08/17/2015 08/17/2015 Electronic Signature(s) Signed: 05/29/2016 1:30:41 PM By: Matthew Kanner MD, FACS Entered By: [name] on 05/29/2016 13:30:41 [name], [name] ([id number]) -------------------------------------------------------------------------------- Progress Note Details Patient Name: [name] Date of Service: 05/29/2016 12:45 PM Medical Record Number: [id number] Patient Account Number: [id number] Date of Birth/Sex: 06-28-1970 (46 y.o. Male) Treating RN: [name] Primary Care Provider: PATIENT, NO Other Clinician: Referring Provider: Treating Provider/Extender: [name] in Treatment: 40 Subjective Chief Complaint Information obtained from Patient Mr. Madlock presents today, after missing last week's appointment, for follow-up on his left buttock stage III pressure ulcer. 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 He was diagnosed with what sounds like a stage III pressure  ulcer in September 2016 although this may have been present for some time longer than that perhaps July 2016. He was followed by wound care in [place]. By his description there were using Silvadene cream for a period of time and then 3 months ago change to Aquacel Ag which she is continued and changes daily with help from  his mother. He has recently relocated to [facility]. He went to urgent care to follow- up on his wound on 08/04/2015 they did a culture of this wound that showed Pseudomonas and MSSA. He was placed on an antibiotic but doesn't remember which one it is. He has continued using Aquacel Ag and a foam based cover. I don't have any of his records from the wound care center in [place] although the patient states he did have an MRI that did not show osteomyelitis. He is not aware of having a bone culture done and doesn't think that he had exposed bone on this and any point. The patient does in and out cath's and has no urinary incontinence issues. He is on appropriate bowel regimen which she administers suppositories. He is able to position himself appropriately and is wheelchair to try and avoid pressure over this wound 08/24/15; small horizontal wound over his lower sacral area. Requires debridement of surface slough and nonviable subcutaneous tissue. There is a small amount of undermining noted no evidence of infection. On the right side of this wound there is some senescent rolled edges which may need debridement I did not do this today. 09/01/15 this is a small wound over the lower sacral area. It has tunneling superiorly. This goes roughly 1 cm. Nonviable subcutaneous tissue at the inferior aspect of the wound base. No real evidence of infection 09/07/15; small horizontal wound over the lower sacral area. Last week it had 1 cm of superior tunneling which appears to of resolved. No evidence of infection 09/21/15; continues to have a small horizontal wound over  the lower sacral area. We have been using Prisma and this has contracted. He tells me that his mother is aware medication not able to change the dressing although he states that he is able to do this 09/28/15; a small horizontal wound over his lower sacral area is all that is left of this wound which was probably much larger and deeper at one point. Depth today at 0.7 cm. Base of this had some nonviable subcutaneous tissue that I removed. Appears to be stalling on current dressing which is Prisma 10/19/15; I have not seen this patient in several weeks. He arrives today with a linear/horizontal wound across his lower sacral area. Considerable depth of this wound. Extensive debridement done 11/17/15; patient arrives for two-week visit. He is not doing well wound is bigger with odor and drainage. Has SEVASTIAN, [name] ([id number]) been using Aquacel Ag. I have not seen this in over a month although I think he was seen by the physician covering me 2 weeks ago on 11/05/15 11/24/15; I brought this man back this week to review the wound after last week's deterioration. Culture grew Enterobacter and some staph aureus [MSSA]. I had ordered him Cipro but we don't seem to of been able to communicate with the patient therefore we've called in today. He is not been systemically unwell. Still using Aquacel 12/15/15; very little change in this wound since the last time I saw it. Horizontal wound over the lower sacral area probably about 2 cm in width and 0.8 cm in depth. There is no evidence of infection. There appears to be callus around this. In spite of the patient's best efforts I think there is an adequate pressure relief here. No current evidence of infection 12/22/15; horizontal pressure ulcer over the lower sacral area. Appears to be filling in and has better dimensions in appearance today. Still circumferential callus which would suggest inadequate  offloading although everything else appears to be  improved 12/29/15 horizontal pressure wound over the lower sacral area. Appears to be filling in however skin groin down into the divot. No evidence of infection 01/05/16; comes in today with not much change in this wound. There is surrounding callus indicative of pressure. Not much change in the wound bed. This clearly require debridement today we have been using Hydrofera Blue 02/04/16: returns today after an absence. no systemic s/s of infection. once again, periwound callus is present suggesting pressure. 02/18/2016 -- she was away for 2 weeks traveling to [place] and during that time he had a new pressure injury to the left ischial tuberosity which he thinks may have been caused while transferring. 03-23-2016; Mr. Ratajczak returns today, after missing last week's appointment, for evaluation of his left buttock stage III pressure ulcer. There is no ulceration to the left ischial tuberosity at this appointment. He states that he has been changing the dressing to his ulcer daily due to daily showering. He voices no complaints or concerns and denies benign antibiotic therapy for any reason at this point. 03/30/2016 -- the patient has been doing fine and after debridement of his wound today I believe is ready for a skin substitute and he has been previously approved for grafix and we will try and obtain this from next week. 04/06/2016 -- the patient's insurance was run again and he had $275 copayment and he has declined the skin substitute. 04/12/16 patient arrived on the wrong day and as he drives from [place] we fit him in the scheduled for review. 05/08/2016 -- I have not seen him for the last 2 weeks but he is doing his dressing diligently and says everything else is doing fine Objective Constitutional Pulse regular. Respirations normal and unlabored. Afebrile. Vitals Time Taken: 12:56 PM, Height: 67 in, Weight: 147 lbs, BMI: 23, Temperature: 97.5 F, Pulse: 102 bpm, Respiratory Rate: 18  breaths/min, Blood Pressure: 129/83 mmHg. [name], [name] ([id number]) Eyes Nonicteric. Reactive to light. Ears, Nose, Mouth, and Throat Lips, teeth, and gums WNL.Marland Kitchen Moist mucosa without lesions. Neck supple and nontender. No palpable supraclavicular or cervical adenopathy. Normal sized without goiter. Respiratory WNL. No retractions.. Breath sounds WNL, No rubs, rales, rhonchi, or wheeze.. Cardiovascular Heart rhythm and rate regular, no murmur or gallop.. Pedal Pulses WNL. No clubbing, cyanosis or edema. Chest Breasts symmetical and no nipple discharge.. Breast tissue WNL, no masses, lumps, or tenderness.. Lymphatic No adneopathy. No adenopathy. No adenopathy. Musculoskeletal Adexa without tenderness or enlargement.. Digits and nails w/o clubbing, cyanosis, infection, petechiae, ischemia, or inflammatory conditions.Marland Kitchen Psychiatric Judgement and insight Intact.. No evidence of depression, anxiety, or agitation.. General Notes: the wound is healed and I gently removed some of the eschar surrounding it and there is no open wound. Integumentary (Hair, Skin) No suspicious lesions. No crepitus or fluctuance. No peri-wound warmth or erythema. No masses.. Wound #1 status is Healed - Epithelialized. Original cause of wound was Pressure Injury. The wound is located on the Left Sacrum. The wound measures 0cm length x 0cm width x 0cm depth; 0cm^2 area and 0cm^3 volume. The wound is limited to skin breakdown. There is a none present amount of drainage noted. The wound margin is flat and intact. There is no granulation within the wound bed. There is no necrotic tissue within the wound bed. The periwound skin appearance exhibited: Callus. The periwound skin appearance did not exhibit: Dry/Scaly, Maceration. Periwound temperature was noted as No Abnormality. Assessment Active Problems ICD-10 [name], [name] ([id number])  N82.95689.323 - Pressure ulcer of left buttock, stage 3 G82.22 - Paraplegia,  incomplete [facility] - Treatment complete His sacral decubitus ulcer is healed and I have asked him to continue to offload well and take all precautions to prevent this from recurring. I have discharged him from the wound care services. Electronic Signature(s) Signed: 05/29/2016 1:32:22 PM By: Matthew KannerBritto, Jasey Cortez MD, FACS Entered By: Matthew KannerBritto, Shellsea Borunda on 05/29/2016 13:32:22 [name], [name] ([id number]) -------------------------------------------------------------------------------- SuperBill Details Patient Name: [name], [name] Date of Service: 05/29/2016 Medical Record Number: [id number] Patient Account Number: [id number] Date of Birth/Sex: 1970/07/30 [age] y.o. Male) Treating RN: Matthew CoventryWoody, Kim Primary Care Provider: PATIENT, NO Other Clinician: Referring Provider: Treating Provider/Extender: Matthew ReBritto, Aadith Raudenbush Weeks in Treatment: 40 Diagnosis Coding ICD-10 Codes Code Description [phone] Pressure ulcer of left buttock, stage 3 G82.22 Paraplegia, incomplete Facility Procedures CPT4 Code: [id number] Description: [id number] - WOUND CARE VISIT-LEV 2 EST PT Modifier: Quantity: 1 Physician Procedures CPT4 Code: [id number] Description: [id number] - WC PHYS LEVEL 2 - EST PT ICD-10 Description Diagnosis L89.323 Pressure ulcer of left buttock, stage 3 G82.22 Paraplegia, incomplete Modifier: Quantity: 1 Electronic Signature(s) Signed: 05/29/2016 1:32:37 PM By: Matthew KannerBritto, Merville Hijazi MD, FACS Entered By: Matthew KannerBritto, Nochum Fenter on 05/29/2016 13:32:37

## 2016-05-30 ENCOUNTER — Ambulatory Visit: Payer: Medicare Other | Admitting: Psychology

## 2016-05-30 ENCOUNTER — Ambulatory Visit: Payer: Medicare Other | Attending: Physical Medicine & Rehabilitation | Admitting: Physical Therapy

## 2016-05-30 DIAGNOSIS — R293 Abnormal posture: Secondary | ICD-10-CM | POA: Diagnosis not present

## 2016-05-30 DIAGNOSIS — G8221 Paraplegia, complete: Secondary | ICD-10-CM

## 2016-05-30 NOTE — Progress Notes (Signed)
[name], [place] ([id number]) Visit Report for 05/29/2016 Arrival Information Details Patient Name: [name] Date of Service: 05/29/2016 12:45 PM Medical Record Number: [id number] Patient Account Number: [id number] Date of Birth/Sex: June 11, 1970 [age] y.o. Male) Treating RN: [name] Primary Care [name]: PATIENT, NO Other Clinician: Referring [name]: Treating [name]/Extender: [name], [name] in Treatment: 40 Visit Information History Since Last Visit Added or deleted any medications: No Patient Arrived: Wheel Chair Any new allergies or adverse reactions: No Arrival Time: 12:55 Had a fall or experienced change in No activities of daily living that may affect Accompanied By: self risk of falls: Transfer Assistance: Manual Signs or symptoms of abuse/neglect since last No Patient Identification Verified: Yes visito Secondary Verification Process Yes Hospitalized since last visit: No Completed: Has Dressing in Place as Prescribed: No Patient Requires Transmission-Based No Pain Present Now: No Precautions: Patient Has Alerts: No Electronic Signature(s) Signed: 05/29/2016 4:39:09 PM By: [name] Entered By: Matthew GurneyWoody, RN, Frank, Frank on 05/29/2016 12:56:18 [name] ([id number]) -------------------------------------------------------------------------------- Clinic Level of Care Assessment Details Patient Name: [name] Date of Service: 05/29/2016 12:45 PM Medical Record Number: [id number] Patient Account Number: [id number] Date of Birth/Sex: June 11, 1970 [age] y.o. Male) Treating RN: [name] Primary Care [name]: PATIENT, NO Other Clinician: Referring [name]: Treating [name]: [name], [name] in Treatment: 40 Clinic Level of Care Assessment Items TOOL 4 Quantity Score []  - Use when only an EandM is performed on FOLLOW-UP visit 0 ASSESSMENTS - Nursing Assessment / Reassessment []  - Reassessment of Co-morbidities (includes updates  in patient status) 0 X - Reassessment of Adherence to Treatment Plan 1 5 ASSESSMENTS - Wound and Skin Assessment / Reassessment X - Simple Wound Assessment / Reassessment - one wound 1 5 []  - Complex Wound Assessment / Reassessment - multiple wounds 0 []  - Dermatologic / Skin Assessment (not related to wound area) 0 ASSESSMENTS - Focused Assessment []  - Circumferential Edema Measurements - multi extremities 0 []  - Nutritional Assessment / Counseling / Intervention 0 []  - Lower Extremity Assessment (monofilament, tuning fork, pulses) 0 []  - Peripheral Arterial Disease Assessment (using hand held doppler) 0 ASSESSMENTS - Ostomy and/or Continence Assessment and Care []  - Incontinence Assessment and Management 0 []  - Ostomy Care Assessment and Management (repouching, etc.) 0 PROCESS - Coordination of Care X - Simple Patient / Family Education for ongoing care 1 15 []  - Complex (extensive) Patient / Family Education for ongoing care 0 []  - Staff obtains ChiropractorConsents, Records, Test Results / Process Orders 0 []  - Staff telephones HHA, Nursing Homes / Clarify orders / etc 0 []  - Routine Transfer to another Facility (non-emergent condition) 0 [name] ([id number]) []  - Routine Hospital Admission (non-emergent condition) 0 []  - New Admissions / Manufacturing engineernsurance Authorizations / Ordering NPWT, Apligraf, etc. 0 []  - Emergency Hospital Admission (emergent condition) 0 X - Simple Discharge Coordination 1 10 []  - Complex (extensive) Discharge Coordination 0 PROCESS - Special Needs []  - Pediatric / Minor Patient Management 0 []  - Isolation Patient Management 0 []  - Hearing / Language / Visual special needs 0 []  - Assessment of Community assistance (transportation, D/C planning, etc.) 0 []  - Additional assistance / Altered mentation 0 []  - Support Surface(s) Assessment (bed, cushion, seat, etc.) 0 INTERVENTIONS - Wound Cleansing / Measurement []  - Simple Wound Cleansing - one wound 0 []  - Complex Wound  Cleansing - multiple wounds 0 X - Wound Imaging (photographs - any number of wounds) 1 5 []  - Wound Tracing (instead of photographs)  0 []  - Simple Wound Measurement - one wound 0 []  - Complex Wound Measurement - multiple wounds 0 INTERVENTIONS - Wound Dressings []  - Small Wound Dressing one or multiple wounds 0 []  - Medium Wound Dressing one or multiple wounds 0 []  - Large Wound Dressing one or multiple wounds 0 []  - Application of Medications - topical 0 []  - Application of Medications - injection 0 INTERVENTIONS - Miscellaneous []  - External ear exam 0 [name] ([id number]) []  - Specimen Collection (cultures, biopsies, blood, body fluids, etc.) 0 []  - Specimen(s) / Culture(s) sent or taken to Lab for analysis 0 []  - Patient Transfer (multiple staff / Michiel Sites Lift / Similar devices) 0 []  - Simple Staple / Suture removal (25 or less) 0 []  - Complex Staple / Suture removal (26 or more) 0 []  - Hypo / Hyperglycemic Management (close monitor of Blood Glucose) 0 []  - Ankle / Brachial Index (ABI) - do not check if billed separately 0 X - Vital Signs 1 5 Has the patient been seen at the hospital within the last three years: Yes Total Score: 45 Level Of Care: New/Established - Level 2 Electronic Signature(s) Signed: 05/29/2016 4:39:09 PM By: Matthew Gurney, RN, Frank, Kim RN, Frank Entered By: Matthew Gurney, RN, Frank, Frank on 05/29/2016 13:19:37 [name] ([id number]) -------------------------------------------------------------------------------- Encounter Discharge Information Details Patient Name: [name] Date of Service: 05/29/2016 12:45 PM Medical Record Number: [id number] Patient Account Number: [id number] Date of Birth/Sex: 1970/09/03 (45 y.o. Male) Treating RN: Matthew Coventry Primary Care Enedina Pair: PATIENT, NO Other Clinician: Referring Morissa Obeirne: Treating Draven Natter/Extender: Matthew Re in Treatment: 40 Encounter Discharge Information Items Discharge Pain Level: 0 Discharge  Condition: Stable Ambulatory Status: Wheelchair Discharge Destination: Home Transportation: Private Auto Accompanied By: self Schedule Follow-up Appointment: Yes Medication Reconciliation completed Yes and provided to Patient/Care Everleigh Colclasure: Patient Clinical Summary of Care: Declined Electronic Signature(s) Signed: 05/29/2016 1:24:20 PM By: Gwenlyn Perking Entered By: Gwenlyn Perking on 05/29/2016 13:24:20 AMER, ALCINDOR ([id number]) -------------------------------------------------------------------------------- Lower Extremity Assessment Details Patient Name: [name] Date of Service: 05/29/2016 12:45 PM Medical Record Number: [id number] Patient Account Number: [id number] Date of Birth/Sex: [birth year] (46 y.o. Male) Treating RN: Matthew Coventry Primary Care Hershel Corkery: PATIENT, NO Other Clinician: Referring Luqman Perrelli: Treating Marliss Buttacavoli/Extender: Matthew Re in Treatment: 40 Electronic Signature(s) Signed: 05/29/2016 4:39:09 PM By: Matthew Gurney, RN, Frank, Kim RN, Frank Entered By: Matthew Gurney, RN, Frank, Frank on 05/29/2016 13:00:35 [name] ([id number]) -------------------------------------------------------------------------------- Multi Wound Chart Details Patient Name: [name] Date of Service: 05/29/2016 12:45 PM Medical Record Number: [id number] Patient Account Number: [id number] Date of Birth/Sex: 02-22-1971 (46 y.o. Male) Treating RN: Matthew Coventry Primary Care Jarrette Dehner: PATIENT, NO Other Clinician: Referring Geffrey Michaelsen: Treating Shanequia Kendrick/Extender: Matthew Re in Treatment: 40 Vital Signs Height(in): 67 Pulse(bpm): 102 Weight(lbs): 147 Blood Pressure 129/83 (mmHg): Body Mass Index(BMI): 23 Temperature(F): 97.5 Respiratory Rate 18 (breaths/min): Photos: [N/A:N/A] Wound Location: Left Sacrum N/A N/A Wounding Event: Pressure Injury N/A N/A Primary Etiology: Pressure Ulcer N/A N/A Comorbid History: Paraplegia N/A N/A Date Acquired: 12/24/2014 N/A N/A Frank of  Treatment: 40 N/A N/A Wound Status: Healed - Epithelialized N/A N/A Measurements L x W x D 0x0x0 N/A N/A (cm) Area (cm) : 0 N/A N/A Volume (cm) : 0 N/A N/A % Reduction in Area: 100.00% N/A N/A % Reduction in Volume: 100.00% N/A N/A Classification: Category/Stage III N/A N/A Exudate Amount: None Present N/A N/A Wound Margin: Flat and Intact N/A N/A Granulation Amount: None Present (0%) N/A N/A Necrotic Amount: None Present (0%) N/A N/A Exposed Structures: N/A N/A  MYKAEL, BATZ ([id number]) Fascia: No Fat Layer (Subcutaneous Tissue) Exposed: No Tendon: No Muscle: No Joint: No Bone: No Limited to Skin Breakdown Epithelialization: Large (67-100%) N/A N/A Periwound Skin Texture: Callus: Yes N/A N/A Periwound Skin Maceration: No N/A N/A Moisture: Dry/Scaly: No Periwound Skin Color: No Abnormalities Noted N/A N/A Temperature: No Abnormality N/A N/A Tenderness on No N/A N/A Palpation: Wound Preparation: Ulcer Cleansing: N/A N/A Rinsed/Irrigated with Saline Topical Anesthetic Applied: Other: lidocaine 4% Treatment Notes Electronic Signature(s) Signed: 05/29/2016 1:30:46 PM By: Evlyn Kanner MD, FACS Entered By: Evlyn Kanner on 05/29/2016 13:30:46 [name] ([id number]) -------------------------------------------------------------------------------- Multi-Disciplinary Care Plan Details Patient Name: [name] Date of Service: 05/29/2016 12:45 PM Medical Record Number: [id number] Patient Account Number: [id number] Date of Birth/Sex: [birth year] (45 y.o. Male) Treating RN: Matthew Coventry Primary Care Vivi Piccirilli: PATIENT, NO Other Clinician: Referring Keyonta Barradas: Treating Satara Virella/Extender: Matthew Re in Treatment: 40 Active Inactive Electronic Signature(s) Signed: 05/29/2016 4:39:09 PM By: Matthew Gurney, RN, Frank, Kim RN, Frank Entered By: Matthew Gurney, RN, Frank, Frank on 05/29/2016 13:18:30 [name]  ([id number]) -------------------------------------------------------------------------------- Pain Assessment Details Patient Name: [name] Date of Service: 05/29/2016 12:45 PM Medical Record Number: [id number] Patient Account Number: [id number] Date of Birth/Sex: 09/11/1970 (46 y.o. Male) Treating RN: Matthew Coventry Primary Care Cay Kath: PATIENT, NO Other Clinician: Referring Manessa Buley: Treating Niti Leisure/Extender: Matthew Re in Treatment: 40 Active Problems Location of Pain Severity and Description of Pain Patient Has Paino No Site Locations With Dressing Change: No Pain Management and Medication Current Pain Management: Electronic Signature(s) Signed: 05/29/2016 4:39:09 PM By: Matthew Gurney, RN, Frank, Kim RN, Frank Entered By: Matthew Gurney, RN, Frank, Frank on 05/29/2016 12:56:24 [name] ([id number]) -------------------------------------------------------------------------------- Patient/Caregiver Education Details Patient Name: [name] Date of Service: 05/29/2016 12:45 PM Medical Record Number: [id number] Patient Account Number: [id number] Date of Birth/Gender: 1971/01/16 (46 y.o. Male) Treating RN: Matthew Coventry Primary Care Physician: PATIENT, NO Other Clinician: Referring Physician: Treating Physician/Extender: Matthew Re in Treatment: 40 Education Assessment Education Provided To: Patient Education Topics Provided Wound/Skin Impairment: Handouts: Other: protect new skin Methods: Demonstration, Explain/Verbal Responses: State content correctly Electronic Signature(s) Signed: 05/29/2016 4:39:09 PM By: Matthew Gurney, RN, Frank, Kim RN, Frank Entered By: Matthew Gurney, RN, Frank, Frank on 05/29/2016 13:20:22 [name] ([id number]) -------------------------------------------------------------------------------- Wound Assessment Details Patient Name: [name] Date of Service: 05/29/2016 12:45 PM Medical Record Number: [id number] Patient Account Number: [id number] Date of  Birth/Sex: [birth year] (46 y.o. Male) Treating RN: Matthew Coventry Primary Care Tadarrius Burch: PATIENT, NO Other Clinician: Referring Hydia Copelin: Treating Maelie Chriswell/Extender: Matthew Re in Treatment: 40 Wound Status Wound Number: 1 Primary Etiology: Pressure Ulcer Wound Location: Left Sacrum Wound Status: Healed - Epithelialized Wounding Event: Pressure Injury Comorbid History: Paraplegia Date Acquired: 12/24/2014 Frank Of Treatment: 40 Clustered Wound: No Photos Photo Uploaded By: Matthew Gurney, RN, Frank, Frank on 05/29/2016 13:27:33 Wound Measurements Length: (cm) 0 % Reduction in Width: (cm) 0 % Reduction in Depth: (cm) 0 Epithelializati Area: (cm) 0 Volume: (cm) 0 Area: 100% Volume: 100% on: Large (67-100%) Wound Description Classification: Category/Stage III Foul Odor After Wound Margin: Flat and Intact Slough/Fibrino Exudate Amount: None Present Cleansing: No No Wound Bed Granulation Amount: None Present (0%) Exposed Structure Necrotic Amount: None Present (0%) Fascia Exposed: No Fat Layer (Subcutaneous Tissue) Exposed: No Tendon Exposed: No Muscle Exposed: No Joint Exposed: No Bone Exposed: No Limited to Skin Breakdown Sato, Shanan ([id number]) Periwound Skin Texture Texture Color No Abnormalities Noted: No No Abnormalities Noted: No Callus: Yes Temperature / Pain Moisture Temperature: No Abnormality No Abnormalities Noted: No Dry / Scaly:  No Maceration: No Wound Preparation Ulcer Cleansing: Rinsed/Irrigated with Saline Topical Anesthetic Applied: Other: lidocaine 4%, Electronic Signature(s) Signed: 05/29/2016 4:39:09 PM By: Matthew Gurney, RN, Frank, Kim RN, Frank Entered By: Matthew Gurney, RN, Frank, Frank on 05/29/2016 13:18:08 [name] ([id number]) -------------------------------------------------------------------------------- Vitals Details Patient Name: [name] Date of Service: 05/29/2016 12:45 PM Medical Record Number: [id number] Patient Account Number:  [id number] Date of Birth/Sex: April 12, 1971 (46 y.o. Male) Treating RN: Matthew Coventry Primary Care Jatoria Kneeland: PATIENT, NO Other Clinician: Referring Juell Radney: Treating Navaeh Kehres/Extender: Matthew Re in Treatment: 40 Vital Signs Time Taken: 12:56 Temperature (F): 97.5 Height (in): 67 Pulse (bpm): 102 Weight (lbs): 147 Respiratory Rate (breaths/min): 18 Body Mass Index (BMI): 23 Blood Pressure (mmHg): 129/83 Reference Range: 80 - 120 mg / dl Electronic Signature(s) Signed: 05/29/2016 4:39:09 PM By: Matthew Gurney, RN, Frank, Kim RN, Frank Entered By: Matthew Gurney, RN, Frank, Frank on 05/29/2016 12:56:45

## 2016-05-30 NOTE — Therapy (Signed)
[facility] [address] [place], [place], [address] Phone: [phone]   Fax:  [phone]  Physical Therapy Treatment  Patient Details  Name: [name] MRN: [id number] Date of Birth: 05-01-1970 Referring Provider: Dr. Maryla MorrowAnkit Patel  Encounter Date: 05/30/2016      PT End of Session - 05/30/16 1959    Visit Number 2   Number of Visits 4   Date for PT Re-Evaluation 06/18/16   Authorization Type G code every 10th visit   PT Start Time 1110  pt arrived 10" late   PT Stop Time 1150   PT Time Calculation (min) 40 min      Past Medical History:  Diagnosis Date  . Thoracic spinal cord injury [facility](HCC)     Past Surgical History:  Procedure Laterality Date  . PROGRAMABLE BACLOFEN PUMP REVISION Left 2014   Removal     There were no vitals filed for this visit.      Subjective Assessment - 05/30/16 1947    Subjective Pt states he still has not received his wheelchair - "it should be here this week"; states he has to cancel his PT appts on Tues. because he has his counseling appt   Pertinent History T8 SCI in 2007; Stage III ulcer/skin tear that is healing (L sacral region).   Patient Stated Goals Exercises for postural asymmetry.    Currently in Pain? No/denies                         [facility]/Exercise - 05/30/16 0001      Lumbar Exercises: Stretches   Lower Trunk Rotation 2 reps;30 seconds  for Rt trunk musc.   Press Ups 2 reps;30 seconds   Press Ups Limitations tightness/spasticity in abdominals and R psoas   Quad Stretch 30 seconds  pt in prone position - bil. knees flexed passively to 90 deg     Performed passive stretching exercises to reduce spasticity in bil. Lower extremities - pt supine - hip/knee flexion/extension 10 reps passively - RLE held in extension due to flexor tone;  Hip adductor stretching in hooklying position  Pt in left sidelying - passive R hip  flexor stretching with his right hip held in neutral position: he attempted to roll right hip forward (By holding onto wheelchair at side of mat) for increased stretch Pt in prone position - bil. Knee flexion - held at 90 degrees for quad stretching; pt propped up on forearms for increased stretch in  Abdominals In seated position - lying on bolster on left side - arm overhead for R trunk lengthening and stretching  Trunk rotation in seated position - hands on left side - 30 sec hold              PT Short Term Goals - 05/19/16 0907      PT SHORT TERM GOAL #1   Title STGs=LTGs           PT Long Term Goals - 05/19/16 0907      PT LONG TERM GOAL #1   Title The patient will demo HEP with written handouts for R trunk elongation and LE stretching.   Baseline Target date 06/22/2016   Time 4   Period Weeks     PT LONG TERM GOAL #2   Title The patient will have w/c further evaluated once permanent chair obtained (being repaired).   Baseline Target date 06/22/2016   Time 4  Period Weeks               Plan - 05/30/16 2000    Clinical Impression Statement Spasticity was decreased in bil. LE's at end of session, after 40" passive stretching in various positions. Recommended pt to have mother to attend session for instruction in stretches for spasticity reduction in bil. lower extremities (pt stated that he felt he could instruct her, that her attending session was not necessary.  R side of trunk (abdominals and Rt hip flexors continue to be more spastic than left side of trunk.   Rehab Potential Good   PT Frequency 1x / week   PT Duration 4 weeks   PT Treatment/Interventions ADLs/Self Care Home Management;Patient/family education;Other (comment);Therapeutic activities;Therapeutic exercise;Neuromuscular re-education   PT Next Visit Plan cont with stretches to reduce tone/spasticity: pt reports that he will let us know when he has received his wheelchair so that pressure mapping  can be scheduled/completed   Consulted and Agree with Plan of Care Patient      Patient will benefit from skilled therapeutic intervention in order to improve the following deficits and impairments:  Decreased skin integrity, Postural dysfunction, Decreased strength, Impaired tone  Visit Diagnosis: Paraplegia, complete (HCC)  Abnormal posture     Problem List Patient Active Problem List   Diagnosis Date Noted  . Stage III pressure ulcer of sacral region (HCC) 11/05/2015  . Open wound of lower back and pelvis w/o penentrat into retroperitoneum 10/12/2015    Shefali Ng, [name] Burnet, PT 05/30/2016, 8:12 PM  [facility] [address] [place], [place], [address] Phone: [phone]   Fax:  [phone]  Name: [name] MRN: [id number] Date of Birth: [birth year]

## 2016-06-05 ENCOUNTER — Ambulatory Visit
Payer: Worker's Compensation | Attending: Physical Medicine & Rehabilitation | Admitting: Rehabilitative and Restorative Service Providers"

## 2016-06-05 DIAGNOSIS — R293 Abnormal posture: Secondary | ICD-10-CM | POA: Insufficient documentation

## 2016-06-05 DIAGNOSIS — G8221 Paraplegia, complete: Secondary | ICD-10-CM | POA: Insufficient documentation

## 2016-06-06 ENCOUNTER — Ambulatory Visit: Payer: Self-pay | Admitting: Internal Medicine

## 2016-06-06 ENCOUNTER — Ambulatory Visit (INDEPENDENT_AMBULATORY_CARE_PROVIDER_SITE_OTHER): Payer: Medicare Other | Admitting: Psychology

## 2016-06-06 DIAGNOSIS — F3132 Bipolar disorder, current episode depressed, moderate: Secondary | ICD-10-CM | POA: Diagnosis not present

## 2016-06-13 ENCOUNTER — Ambulatory Visit (INDEPENDENT_AMBULATORY_CARE_PROVIDER_SITE_OTHER): Payer: Medicare Other | Admitting: Psychology

## 2016-06-13 DIAGNOSIS — F3132 Bipolar disorder, current episode depressed, moderate: Secondary | ICD-10-CM

## 2016-06-14 ENCOUNTER — Ambulatory Visit: Payer: Worker's Compensation | Admitting: Rehabilitative and Restorative Service Providers"

## 2016-06-14 DIAGNOSIS — R293 Abnormal posture: Secondary | ICD-10-CM | POA: Diagnosis not present

## 2016-06-14 DIAGNOSIS — G8221 Paraplegia, complete: Secondary | ICD-10-CM | POA: Diagnosis not present

## 2016-06-14 NOTE — Therapy (Signed)
[facility] [address] [place], [place], [address] Phone: [phone]   Fax:  [phone]  Physical Therapy Treatment  Patient Details  Name: Matthew EvangelistCharles Bommarito MRN: [id number] Date of Birth: October 04, 1970 Referring Provider: Dr. Maryla MorrowAnkit Patel  Encounter Date: 06/14/2016      PT End of Session - 06/14/16 1926    Visit Number 3   Number of Visits 4   Date for PT Re-Evaluation 06/18/16   Authorization Type G code every 10th visit   PT Start Time 1102   PT Stop Time 1142   PT Time Calculation (min) 40 min      Past Medical History:  Diagnosis Date  . Thoracic spinal cord injury [facility](HCC)     Past Surgical History:  Procedure Laterality Date  . PROGRAMABLE BACLOFEN PUMP REVISION Left 2014   Removal     There were no vitals filed for this visit.      Subjective Assessment - 06/14/16 1104    Subjective The patient has obtained his new chair.  This is the chair he wants to have seating evaluation completed with.  He reports his sacral sore is healed at this time and he is not actively going to wound center.  Patient is going on vacation in March (to [place] and then to [place] in April.    Exercises do reduce pain for a short period of time.    Pertinent History T8 SCI in 2007; Stage III ulcer/skin tear that is healing (L sacral region).   Patient Stated Goals Exercises for postural asymmetry.    Currently in Pain? Yes   Pain Score 3    Pain Location Flank   Pain Orientation Right;Lower  transverse abdominus   Pain Descriptors / Indicators Cramping;Tightness;Spasm   Pain Type Chronic pain   Pain Onset More than a month ago   Pain Frequency Intermittent   Aggravating Factors  positioning in wheelchair (obliquity noted)   Pain Relieving Factors stretching                         [facility] Adult PT Treatment/Exercise - 06/14/16 1927      Transfers   Transfers Lateral/Scoot Transfers   Lateral/Scoot  Transfers 6: Modified independent (Device/Increase time)     Self-Care   Self-Care Other Self-Care Comments   Other Self-Care Comments  PT and patient discussed postural positioning in w/c.  He has L illiac crest inferior to R iliac crest with observable R trunk shortening.  PT placed a small wedge (3/4") and then a larger wedge (1.25") under w/c cushion on the left side to level hips.  The smaller correction led to more symmetry in trunk positioning.  The larger wedge led to overcorrection with exaggerated asymmetry.  PT discussed with patient that due to h/o sacral wound, no changes should be made to current seating system without pressure mapping.  Patient agrees.       Exercises   Exercises Other Exercises   Other Exercises  Prone hip flexor stretching with sustained pressure over posterior aspect of hips to decrease muscle tone;  prone on elbows with prone press up adding trunk rotation to reach right and left sides of lower abdominals for stretching; performed sidelying R with R elbow prop for lateral trunk stretching; prone quad stretch PROM; supine trunk rotation with passive overpressure.                   PT Short Term Goals -  05/19/16 0907      PT SHORT TERM GOAL #1   Title STGs=LTGs           PT Long Term Goals - 05/19/16 0907      PT LONG TERM GOAL #1   Title The patient will demo HEP with written handouts for R trunk elongation and LE stretching.   Baseline Target date 06/22/2016   Time 4   Period Weeks     PT LONG TERM GOAL #2   Title The patient will have w/c further evaluated once permanent chair obtained (being repaired).   Baseline Target date 06/22/2016   Time 4   Period Weeks               Plan - 06/14/16 1934    Clinical Impression Statement The patient is able to demonstrate stretching that relieves discomfort in R trunk/transverse abdominus.  He now has w/c and is ready for seating/pressure mapping.  PT to assist with scheduling to help  determine best accomodations for R trunk discomfort/positioning.    PT Treatment/Interventions ADLs/Self Care Home Management;Patient/family education;Other (comment);Therapeutic activities;Therapeutic exercise;Neuromuscular re-education   PT Next Visit Plan pressure mapping, check goals, d/c *may need recert for next session   Consulted and Agree with Plan of Care Patient      Patient will benefit from skilled therapeutic intervention in order to improve the following deficits and impairments:  Decreased skin integrity, Postural dysfunction, Decreased strength, Impaired tone  Visit Diagnosis: Abnormal posture     Problem List Patient Active Problem List   Diagnosis Date Noted  . Stage III pressure ulcer of sacral region (HCC) 11/05/2015  . Open wound of lower back and pelvis w/o penentrat into retroperitoneum 10/12/2015    Maday Guarino, PT 06/14/2016, 7:35 PM  [facility] [address] [place], [place], [address] Phone: [phone]   Fax:  [phone]  Name: [name] MRN: [id number] Date of Birth: 20-Apr-1971

## 2016-06-20 ENCOUNTER — Ambulatory Visit: Payer: Medicare Other | Admitting: Psychology

## 2016-06-21 ENCOUNTER — Encounter: Payer: Self-pay | Admitting: Physical Medicine & Rehabilitation

## 2016-06-21 ENCOUNTER — Encounter
Payer: PRIVATE HEALTH INSURANCE | Attending: Physical Medicine & Rehabilitation | Admitting: Physical Medicine & Rehabilitation

## 2016-06-21 ENCOUNTER — Ambulatory Visit: Payer: Worker's Compensation | Admitting: Rehabilitative and Restorative Service Providers"

## 2016-06-21 VITALS — BP 107/74 | HR 87 | Resp 14

## 2016-06-21 DIAGNOSIS — G8220 Paraplegia, unspecified: Secondary | ICD-10-CM | POA: Diagnosis not present

## 2016-06-21 DIAGNOSIS — S31000A Unspecified open wound of lower back and pelvis without penetration into retroperitoneum, initial encounter: Secondary | ICD-10-CM | POA: Insufficient documentation

## 2016-06-21 DIAGNOSIS — G8221 Paraplegia, complete: Secondary | ICD-10-CM

## 2016-06-21 DIAGNOSIS — M25551 Pain in right hip: Secondary | ICD-10-CM

## 2016-06-21 DIAGNOSIS — K592 Neurogenic bowel, not elsewhere classified: Secondary | ICD-10-CM | POA: Insufficient documentation

## 2016-06-21 DIAGNOSIS — R293 Abnormal posture: Secondary | ICD-10-CM | POA: Diagnosis not present

## 2016-06-21 DIAGNOSIS — Z5189 Encounter for other specified aftercare: Secondary | ICD-10-CM | POA: Insufficient documentation

## 2016-06-21 DIAGNOSIS — N319 Neuromuscular dysfunction of bladder, unspecified: Secondary | ICD-10-CM | POA: Diagnosis not present

## 2016-06-21 DIAGNOSIS — G479 Sleep disorder, unspecified: Secondary | ICD-10-CM | POA: Insufficient documentation

## 2016-06-21 DIAGNOSIS — R531 Weakness: Secondary | ICD-10-CM | POA: Diagnosis present

## 2016-06-21 DIAGNOSIS — R252 Cramp and spasm: Secondary | ICD-10-CM | POA: Diagnosis not present

## 2016-06-21 MED ORDER — BACLOFEN 10 MG PO TABS: 30.0000 mg | ORAL_TABLET | Freq: Three times a day (TID) | ORAL | 1 refills | Status: AC

## 2016-06-21 MED ORDER — TIZANIDINE HCL 2 MG PO TABS: 4.0000 mg | ORAL_TABLET | Freq: Three times a day (TID) | ORAL | 0 refills | Status: DC | PRN

## 2016-06-21 NOTE — Progress Notes (Signed)
Subjective:    Patient ID: [name], male    DOB: 1970-08-12, 46 y.o.   MRN: [id number]  HPI  46 y/o male with pmh of T8 SCI 2007 and psh of baclofen pump removal in 04/2013 presents for follow up of for management of his SCI.   Initially stated: His main issues were his postures and asymmetrical weakness and spasms.  Pt had his ITB pump removed due to inconvenience and stressors of withdrawal due to pump failure. [place] and [place] to [place].  He is looking to establish care as well.  He has never had AD.  He does AM bowel program.  No movement or sensation below T8. He is independent, lives with his mother.  For spasms, he has tried oral baclofen 20mg  TID, but made him lethargic.  He took valium, which he usually took at night.    Last clinic visit 05/24/16.  Since last visit, he had his chair fixed and has an appointment later today for seating eval.  He continues to take Baclofen, but does not notice much difference.  He does not notice much difference with Tizanidine either. Gas is about the same, but simethicone is helping. He did not discuss E-stim.  Pt was denied for ultrasound of his RLE for ?myositis ossificans.   Pain Inventory Average Pain 7 Pain Right Now 6 My pain is intermittent, sharp, burning, stabbing and aching  In the last 24 hours, has pain interfered with the following? General activity 8 Relation with others 8 Enjoyment of life 8 What TIME of day is your pain at its worst? morning, night Sleep (in general) Poor  Pain is worse with: walking, bending, sitting, inactivity, standing and some activites Pain improves with: rest and therapy/exercise Relief from Meds: 3  Mobility do you drive?  yes use a wheelchair Do you have any goals in this area?  no  Function disabled: date disabled 02/28/2006 I need assistance with the following:  household duties Do you have any goals in this area?  no  Neuro/Psych bladder control  problems weakness numbness tremor tingling spasms depression  Prior Studies Any changes since last visit?  no  Physicians involved in your care Any changes since last visit?  no   No family history on file. Social History   Social History  . Marital status: Divorced    Spouse name: N/A  . Number of children: N/A  . Years of education: N/A   Social History Main Topics  . Smoking status: Current Every Day Smoker    Packs/day: 0.50    Types: Cigarettes  . Smokeless tobacco: Current User  . Alcohol use 0.0 oz/week  . Drug use: Unknown  . Sexual activity: Not Asked   Other Topics Concern  . None   Social History Narrative  . None   Past Surgical History:  Procedure Laterality Date  . PROGRAMABLE BACLOFEN PUMP REVISION Left 2014   Removal    Past Medical History:  Diagnosis Date  . Thoracic spinal cord injury (HCC)    BP 107/74   Pulse 87   Resp 14   SpO2 96%   Opioid Risk Score:   Fall Risk Score:  `1  Depression screen PHQ 2/9  Depression screen PHQ 2/9 10/28/2015  Decreased Interest 1  Down, Depressed, Hopeless 1  PHQ - 2 Score 2  Altered sleeping 2  Tired, decreased energy 1  Change in appetite 0  Feeling bad or failure about yourself  1  Trouble concentrating 1  Moving slowly or fidgety/restless 1  Suicidal thoughts 0  PHQ-9 Score 8  Difficult doing work/chores Somewhat difficult    Review of Systems  Constitutional: Positive for diaphoresis.       Bladder control problems   HENT: Negative.   Eyes: Negative.   Respiratory: Negative.   Cardiovascular: Negative.   Gastrointestinal: Positive for abdominal pain.  Endocrine: Negative.   Genitourinary: Positive for difficulty urinating.  Musculoskeletal:       Spasms   Skin: Negative.   Allergic/Immunologic: Negative.   Neurological: Positive for tremors, weakness and numbness.       Tingling   Hematological: Negative.   Psychiatric/Behavioral: Positive for dysphoric mood.  All other  systems reviewed and are negative.     Objective:   Physical Exam Gen: NAD. Vital signs reviewed HENT: Normocephalic, Atraumatic Eyes: EOMI. No discharge.  Cardio: RRR. No JVD.  Pulm: B/l clear to auscultation.  Effort normal Abd: Soft. BS+ MSK:  Gait nonambulatory.   No TTP.    No edema. Neuro: Sensation absent below T8 dermatomes  Strength  5/5 in all UE myotomes    0/5 in all LE myotomes  Spasms in B/l LE, making it difficult to assess spasticity, most pronounced in adductors Skin: Warm and Dry. Intact.    Assessment & Plan:  46 y/o male with pmh of T8 SCI 2007 and psh of baclofen pump removal in 04/2013 presents for follow up of for management of his SCI.  1. 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 Neurogenic Bladder  Cont to follow with Urology  3. Neurogenic bowel  Cont bowel reg with suppository  4. Spasticity/Spasms   Had ITB pump, but did not want to keep due to convenience and stressors of withdrawals  Valium ineffective, pt does not want to go back to addictive meds  Cont Baclofen 30 TID  Will increase tizanidine 4mg  TID PRN  Likely exacerbated by gas and ulcers  Will schedule Botox for b/l hip adductor and RLE abdominal muscle  5. Abnormal posture  Cont PT for postural stabilization and seating evaluation once pt has permanent chair  Xrays reviewed, showing degenerative changes in L-spine  Will schedule Botox for b/l hip adductor and RLE abdominal muscle   6. Left lower back open wound  Cont to follow with wound care with dressing changes  Improving, healed  7. Sleep disturbance  See #1  Improving  8. Gas  Simethicone ineffective  No able to tolerate Senna  Xray reviewed, normal bowel/gas patters  ?SMA, will cont to evaluate, however, appears less likely at this point given  lack of other symptoms  Improved  Encouraged follow up for FES  9. Right hip pain  Xray reviewed, ?myositis ossificans, ultrasound denied, will order repeat film for eval

## 2016-06-22 NOTE — Therapy (Signed)
[facility] [address] [place], [place], [address] Phone: [phone]   Fax:  [phone]  Physical Therapy Treatment  Patient Details  Name: [name] MRN: [id number] Date of Birth: May 02, 1970 Referring Provider: Dr. Delice Lesch  Encounter Date: 06/21/2016      PT End of Session - 06/22/16 1219    Visit Number 4   Number of Visits 4   Date for PT Re-Evaluation 06/18/16   Authorization Type G code every 10th visit   PT Start Time 1158   PT Stop Time 1215   PT Time Calculation (min) 17 min      Past Medical History:  Diagnosis Date  . Thoracic spinal cord injury [facility])     Past Surgical History:  Procedure Laterality Date  . PROGRAMABLE BACLOFEN PUMP REVISION Left 2014   Removal     There were no vitals filed for this visit.      Subjective Assessment - 06/22/16 1217    Subjective The patient arrives to pressure mapping today with [employer].  He is in his manual w/c with J3 foam/air combo.   Pertinent History T8 SCI in 2007; Stage III ulcer/skin tear that is healing (L sacral region).   Patient Stated Goals Exercises for postural asymmetry.    Currently in Pain? --  see prior notes-goal of this session was pressure mapping      SELF CARE/HOME MANAGEMENT: Discussed continuation of HEP and stretching.  Discussed with patient and Deberah Pelton, ATP from [employer] concerns regarding pelvic asymmetry in determining best seating system.  *PT only involved in session < 15 minutes and then patient remained with Erlene Quan for pressure mapping to determine best cushion.  Recommendations based on objective pressure mapping. W/c vendor to pursue cushion.      PT Short Term Goals - 05/19/16 0907      PT SHORT TERM GOAL #1   Title STGs=LTGs           PT Long Term Goals - 06/22/16 1220      PT LONG TERM GOAL #1   Title The patient will demo HEP with written handouts for R trunk elongation and LE  stretching.   Baseline met.   Time 4   Period Weeks   Status Achieved     PT LONG TERM GOAL #2   Title The patient will have w/c further evaluated once permanent chair obtained (being repaired).   Baseline Met   Time 4   Period Weeks   Status Achieved               Plan - 06/22/16 1220    Clinical Impression Statement The patient met LTGs and has HEP and was pressure mapped today for new cushion for pelvic asymmetry and due to h/o pressure ulceration.     PT Next Visit Plan Discharge today   Consulted and Agree with Plan of Care Patient      Patient will benefit from skilled therapeutic intervention in order to improve the following deficits and impairments:     Visit Diagnosis: Abnormal posture  Paraplegia, complete ([place])  PHYSICAL THERAPY DISCHARGE SUMMARY  Visits from Start of Care: 4  Current functional level related to goals / functional outcomes: See above   Remaining deficits: Continued tightness in R trunk musculature.   Education / Equipment: HEP for stretching.  Plan: Patient agrees to discharge.  Patient goals were met. Patient is being discharged due to meeting the stated rehab goals.  ?????  Thank you for the referral of this patient. Rudell Cobb, MPT  [place], PT 06/22/2016, 12:21 PM  [facility] [address] [place], [place], [address] Phone: [phone]   Fax:  [phone]  Name: Jj Enyeart MRN: [id number] Date of Birth: [birth year]

## 2016-06-27 ENCOUNTER — Ambulatory Visit: Payer: Self-pay | Admitting: Psychology

## 2016-07-04 ENCOUNTER — Ambulatory Visit (INDEPENDENT_AMBULATORY_CARE_PROVIDER_SITE_OTHER): Payer: Worker's Compensation | Admitting: Psychology

## 2016-07-04 DIAGNOSIS — F3132 Bipolar disorder, current episode depressed, moderate: Secondary | ICD-10-CM | POA: Diagnosis not present

## 2016-07-05 ENCOUNTER — Encounter
Payer: Worker's Compensation | Attending: Physical Medicine & Rehabilitation | Admitting: Physical Medicine & Rehabilitation

## 2016-07-05 ENCOUNTER — Encounter: Payer: Self-pay | Admitting: Physical Medicine & Rehabilitation

## 2016-07-05 VITALS — BP 124/88 | HR 86 | Resp 14

## 2016-07-05 DIAGNOSIS — R252 Cramp and spasm: Secondary | ICD-10-CM | POA: Insufficient documentation

## 2016-07-05 DIAGNOSIS — R293 Abnormal posture: Secondary | ICD-10-CM | POA: Diagnosis not present

## 2016-07-05 DIAGNOSIS — K592 Neurogenic bowel, not elsewhere classified: Secondary | ICD-10-CM | POA: Insufficient documentation

## 2016-07-05 DIAGNOSIS — G8220 Paraplegia, unspecified: Secondary | ICD-10-CM | POA: Diagnosis not present

## 2016-07-05 DIAGNOSIS — N319 Neuromuscular dysfunction of bladder, unspecified: Secondary | ICD-10-CM | POA: Diagnosis not present

## 2016-07-05 DIAGNOSIS — S31000A Unspecified open wound of lower back and pelvis without penetration into retroperitoneum, initial encounter: Secondary | ICD-10-CM | POA: Diagnosis not present

## 2016-07-05 DIAGNOSIS — G479 Sleep disorder, unspecified: Secondary | ICD-10-CM | POA: Insufficient documentation

## 2016-07-05 DIAGNOSIS — R531 Weakness: Secondary | ICD-10-CM | POA: Diagnosis present

## 2016-07-05 DIAGNOSIS — Z5189 Encounter for other specified aftercare: Secondary | ICD-10-CM | POA: Diagnosis not present

## 2016-07-05 MED ORDER — TIZANIDINE HCL 2 MG PO TABS: 4.0000 mg | ORAL_TABLET | Freq: Three times a day (TID) | ORAL | 0 refills | Status: DC | PRN

## 2016-07-05 NOTE — Progress Notes (Signed)
Botox: Procedure Note Patient Name: Matthew EvangelistCharles Frank DOB: [birth year] MRN: [id number]   Procedure: Botulinum toxin administration Guidance: EMG Diagnosis: Spastic diplegia Date: 07/05/16 Attending: Maryla MorrowAnkit Keiarah Orlowski, MD   Informed consent: Risks, benefits & options of the procedure are explained to the patient (and/or family). The patient elects to proceed with procedure. Risks include but are not limited to weakness, respiratory distress, dry mouth, ptosis, antibody formation, worsening of some areas of function. Benefits include decreased abnormal muscle tone, improved hygiene and positioning, decreased skin breakdown and, in some cases, decreased pain. Options include conservative management with oral antispasticity agents, phenol chemodenervation of nerve or at motor nerve branches. More invasive options include intrathecal balcofen adminstration for appropriate candidates. Surgical options may include tendon lengthening or transposition or, rarely, dorsal rhizotomy.   History/Physical Examination: 46 y.o. male with pmh of T8 SCI 2007 and psh of baclofen pump removal in 04/2013.  MAS: Limitted eval due to significant spasms  Previous Treatments: Therapy/Range of motion/Baclofen pump Indication for guidance: Target active muscules  Procedure: Botulinum toxin was mixed with preservative free saline with a dilution of 1cc to 100 units. Targeted limb and muscles were identified. The skin was prepped with alcohol swabs and placement of needle tip in targeted muscle was confirmed using appropriate guidance. Prior to injection, positioning of needle tip outside of blood vessel was determined by pulling back on syringe plunger.  MUSCLE UNITS Right adductor magnus: 75U Left adductor magnus: 75U Right external oblique: 50U  Total units used: 200 Complications: None  Plan:  RTC 6 weeks for follow up eval  May need increased dose in right external oblique as well as right rectus abdominus  [name] [name]  [name] 07/05/16 12:17 PM

## 2016-07-11 ENCOUNTER — Ambulatory Visit: Payer: Medicare Other | Admitting: Psychology

## 2016-07-25 ENCOUNTER — Ambulatory Visit (INDEPENDENT_AMBULATORY_CARE_PROVIDER_SITE_OTHER): Payer: Worker's Compensation | Admitting: Psychology

## 2016-07-25 DIAGNOSIS — F3132 Bipolar disorder, current episode depressed, moderate: Secondary | ICD-10-CM

## 2016-08-01 ENCOUNTER — Ambulatory Visit (INDEPENDENT_AMBULATORY_CARE_PROVIDER_SITE_OTHER): Payer: Worker's Compensation | Admitting: Psychology

## 2016-08-01 DIAGNOSIS — F3132 Bipolar disorder, current episode depressed, moderate: Secondary | ICD-10-CM

## 2016-08-08 ENCOUNTER — Ambulatory Visit: Payer: Medicare Other | Admitting: Psychology

## 2016-08-15 ENCOUNTER — Ambulatory Visit: Payer: Self-pay | Admitting: Psychology

## 2016-08-16 ENCOUNTER — Encounter
Payer: Worker's Compensation | Attending: Physical Medicine & Rehabilitation | Admitting: Physical Medicine & Rehabilitation

## 2016-08-16 ENCOUNTER — Encounter: Payer: Self-pay | Admitting: Physical Medicine & Rehabilitation

## 2016-08-16 VITALS — BP 118/79 | HR 110

## 2016-08-16 DIAGNOSIS — K592 Neurogenic bowel, not elsewhere classified: Secondary | ICD-10-CM | POA: Diagnosis not present

## 2016-08-16 DIAGNOSIS — Z5189 Encounter for other specified aftercare: Secondary | ICD-10-CM | POA: Diagnosis present

## 2016-08-16 DIAGNOSIS — G8220 Paraplegia, unspecified: Secondary | ICD-10-CM | POA: Diagnosis not present

## 2016-08-16 DIAGNOSIS — M62838 Other muscle spasm: Secondary | ICD-10-CM | POA: Diagnosis not present

## 2016-08-16 DIAGNOSIS — G479 Sleep disorder, unspecified: Secondary | ICD-10-CM | POA: Diagnosis not present

## 2016-08-16 DIAGNOSIS — R252 Cramp and spasm: Secondary | ICD-10-CM | POA: Insufficient documentation

## 2016-08-16 DIAGNOSIS — S31000A Unspecified open wound of lower back and pelvis without penetration into retroperitoneum, initial encounter: Secondary | ICD-10-CM | POA: Insufficient documentation

## 2016-08-16 DIAGNOSIS — N319 Neuromuscular dysfunction of bladder, unspecified: Secondary | ICD-10-CM | POA: Insufficient documentation

## 2016-08-16 DIAGNOSIS — M25551 Pain in right hip: Secondary | ICD-10-CM | POA: Diagnosis not present

## 2016-08-16 DIAGNOSIS — R293 Abnormal posture: Secondary | ICD-10-CM | POA: Insufficient documentation

## 2016-08-16 DIAGNOSIS — R531 Weakness: Secondary | ICD-10-CM | POA: Diagnosis present

## 2016-08-16 MED ORDER — TIZANIDINE HCL 4 MG PO TABS: 4.0000 mg | ORAL_TABLET | Freq: Three times a day (TID) | ORAL | 1 refills | Status: DC | PRN

## 2016-08-16 NOTE — Progress Notes (Signed)
Subjective:    Patient ID: Matthew Standard., male    DOB: 09-30-1970, 46 y.o.   MRN: [id number]  HPI  46 y/o male with pmh of T8 SCI 2007 and psh of baclofen pump removal in 04/2013 presents for follow up of for management of his SCI.   Initially stated: His main issues were his postures and asymmetrical weakness and spasms.  Pt had his ITB pump removed due to inconvenience and stressors of withdrawal due to pump failure. [place] was living [place] and relocated to [place].  He is looking to establish care as well.  He has never had AD.  He does AM bowel program.  No movement or sensation below T8. He is independent, lives with his mother.  For spasms, he has tried oral baclofen  TID, but made him lethargic.  He took valium, which he usually took at night.    Last clinic visit 07/05/16.  Since last visit, he has obtained imaging of his femur.  He states he has had spasms since the weather change.  He notes improvement with Botox, but only lasting for brief period.  He stopped using Baclofen due to lack of efficacy. He is going to try Tizanidine by itself.  Previously patient denied pain associated with diet, but now states he feels it is related to eating.  Overall, pt states he has been okay, but that last few days have been really bad.    Pain Inventory Average Pain 7 Pain Right Now 6 My pain is intermittent, sharp, burning, stabbing and aching  In the last 24 hours, has pain interfered with the following? General activity 8 Relation with others 8 Enjoyment of life 8 What TIME of day is your pain at its worst? morning, night Sleep (in general) Poor  Pain is worse with: walking, bending, sitting, inactivity, standing and some activites Pain improves with: rest and therapy/exercise Relief from Meds: 3  Mobility do you drive?  yes use a wheelchair Do you have any goals in this area?  no  Function disabled: date disabled 02/28/2006 I need assistance with the following:  household  duties Do you have any goals in this area?  no  Neuro/Psych bladder control problems weakness numbness tremor tingling spasms depression  Prior Studies Any changes since last visit?  no  Physicians involved in your care Any changes since last visit?  no   No family history on file. Social History   Social History  . Marital status: Divorced    Spouse name: N/A  . Number of children: N/A  . Years of education: N/A   Social History Main Topics  . Smoking status: Current Every Day Smoker    Packs/day: 0.50    Types: Cigarettes  . Smokeless tobacco: Current User  . Alcohol use 0.0 oz/week  . Drug use: Unknown  . Sexual activity: Not Asked   Other Topics Concern  . None   Social History Narrative  . None   Past Surgical History:  Procedure Laterality Date  . PROGRAMABLE BACLOFEN PUMP REVISION Left 2014   Removal    Past Medical History:  Diagnosis Date  . Thoracic spinal cord injury (HCC)    BP 118/79   Pulse (!) 110   SpO2 98%   Opioid Risk Score:   Fall Risk Score:  `1  Depression screen PHQ 2/9  Depression screen [facility] 2/9 07/05/2016 10/28/2015  Decreased Interest 1 1  Down, Depressed, Hopeless 1 1  PHQ - 2 Score 2 2  Altered sleeping - 2  Tired, decreased energy - 1  Change in appetite - 0  Feeling bad or failure about yourself  - 1  Trouble concentrating - 1  Moving slowly or fidgety/restless - 1  Suicidal thoughts - 0  PHQ-9 Score - 8  Difficult doing work/chores - Somewhat difficult    Review of Systems  Constitutional: Positive for diaphoresis.       Bladder control problems   HENT: Negative.   Eyes: Negative.   Respiratory: Negative.   Cardiovascular: Negative.   Gastrointestinal: Positive for abdominal pain.  Endocrine: Negative.   Genitourinary: Positive for difficulty urinating.  Musculoskeletal:       Spasms   Skin: Negative.   Allergic/Immunologic: Negative.   Neurological: Positive for tremors, weakness and numbness.        Tingling   Hematological: Negative.   Psychiatric/Behavioral: Positive for dysphoric mood.  All other systems reviewed and are negative.     Objective:   Physical Exam Gen: NAD. Vital signs reviewed HENT: Normocephalic, Atraumatic Eyes: EOMI. No discharge.  Cardio: RRR. No JVD.  Pulm: B/l clear to auscultation.  Effort normal Abd: Soft. BS+ MSK:  Gait nonambulatory.   No TTP.    No edema. Neuro: Sensation absent below T8 dermatomes  Strength  5/5 in all UE myotomes    0/5 in all LE myotomes  Spasms in B/l LE, making it difficult to assess spasticity, most pronounced in adductors, improved from prior to injections Skin: Warm and Dry. Intact.    Assessment & Plan:  46 y/o male with pmh of T8 SCI 2007 and psh of baclofen pump removal in 04/2013 presents for follow up of for management of his SCI.  1. T8 Paraplegia  Cont HEP, postural exercises   Cont ROM, stretching  Cont follow up with Psychology   UDS +THC previously   Pt to call volunteer services for mentorship at hospital    Obtained new cushion last week  2. Neurogenic Bladder  Cont to follow with Urology  3. Neurogenic bowel  Cont bowel reg with suppository  4. Spasticity/Spasms   Had ITB pump, but did not want to keep due to convenience and stressors of withdrawals  Valium ineffective, pt does not want to go back to addictive meds  Baclofen 30 TID d/ced due to lack of efficacy.  Cont tizanidine  TID PRN, may try  TID if no side effects  Likely exacerbated by gas and ulcers  Botox with temporary relief, will increase   Right adductor magnus: 75U, will increase to 150U   Left adductor magnus: 75U, will increase to 150U   Right external oblique: 50U, will increase to 100U   Will add rectus abdoimus at 75U    5. Abnormal posture  Cont HEP, new cushion obtainted.   Xrays reviewed, showing degenerative changes in L-spine, encouraged follow up  6. Sleep disturbance  See #1  7. Gas  Simethicone  ineffective  No able to tolerate Senna  Xray reviewed, normal bowel/gas patters  ?SMA, will cont to evaluate, now appears more likely given symptoms and association with food  Encouraged follow up for FES  8. Right hip pain  Xray reviewed, ?myositis ossificans, ultrasound denied, repeat film for eval pending

## 2016-08-21 ENCOUNTER — Ambulatory Visit (HOSPITAL_COMMUNITY)
Admission: RE | Admit: 2016-08-21 | Discharge: 2016-08-21 | Disposition: A | Discharge status: Home / Self Care | Payer: Worker's Compensation | Source: Ambulatory Visit | Attending: Physical Medicine & Rehabilitation | Admitting: Physical Medicine & Rehabilitation

## 2016-08-21 DIAGNOSIS — S79911A Unspecified injury of right hip, initial encounter: Secondary | ICD-10-CM | POA: Diagnosis not present

## 2016-08-21 DIAGNOSIS — M25551 Pain in right hip: Secondary | ICD-10-CM | POA: Diagnosis present

## 2016-08-22 ENCOUNTER — Ambulatory Visit (INDEPENDENT_AMBULATORY_CARE_PROVIDER_SITE_OTHER): Payer: Medicare Other | Admitting: Psychology

## 2016-08-22 DIAGNOSIS — F3132 Bipolar disorder, current episode depressed, moderate: Secondary | ICD-10-CM | POA: Diagnosis not present

## 2016-08-29 ENCOUNTER — Ambulatory Visit: Payer: Medicare Other | Admitting: Psychology

## 2016-08-30 ENCOUNTER — Encounter: Payer: PRIVATE HEALTH INSURANCE | Admitting: Physical Medicine & Rehabilitation

## 2016-09-12 ENCOUNTER — Ambulatory Visit: Payer: Medicare Other | Admitting: Psychology

## 2016-09-13 ENCOUNTER — Encounter
Payer: Worker's Compensation | Attending: Physical Medicine & Rehabilitation | Admitting: Physical Medicine & Rehabilitation

## 2016-09-13 ENCOUNTER — Encounter: Payer: Self-pay | Admitting: Physical Medicine & Rehabilitation

## 2016-09-13 VITALS — BP 95/66 | HR 98

## 2016-09-13 DIAGNOSIS — K592 Neurogenic bowel, not elsewhere classified: Secondary | ICD-10-CM | POA: Insufficient documentation

## 2016-09-13 DIAGNOSIS — R252 Cramp and spasm: Secondary | ICD-10-CM | POA: Insufficient documentation

## 2016-09-13 DIAGNOSIS — Z9889 Other specified postprocedural states: Secondary | ICD-10-CM | POA: Diagnosis not present

## 2016-09-13 DIAGNOSIS — F17210 Nicotine dependence, cigarettes, uncomplicated: Secondary | ICD-10-CM | POA: Insufficient documentation

## 2016-09-13 DIAGNOSIS — Z5189 Encounter for other specified aftercare: Secondary | ICD-10-CM | POA: Insufficient documentation

## 2016-09-13 DIAGNOSIS — M619 Calcification and ossification of muscle, unspecified: Secondary | ICD-10-CM | POA: Diagnosis not present

## 2016-09-13 DIAGNOSIS — M6150 Other ossification of muscle, unspecified site: Secondary | ICD-10-CM | POA: Diagnosis not present

## 2016-09-13 DIAGNOSIS — M62838 Other muscle spasm: Secondary | ICD-10-CM

## 2016-09-13 DIAGNOSIS — R293 Abnormal posture: Secondary | ICD-10-CM

## 2016-09-13 DIAGNOSIS — G8220 Paraplegia, unspecified: Secondary | ICD-10-CM | POA: Diagnosis not present

## 2016-09-13 DIAGNOSIS — N319 Neuromuscular dysfunction of bladder, unspecified: Secondary | ICD-10-CM | POA: Diagnosis not present

## 2016-09-13 DIAGNOSIS — R1084 Generalized abdominal pain: Secondary | ICD-10-CM

## 2016-09-13 DIAGNOSIS — G479 Sleep disorder, unspecified: Secondary | ICD-10-CM | POA: Insufficient documentation

## 2016-09-13 MED ORDER — TIZANIDINE HCL 4 MG PO TABS: 8.0000 mg | ORAL_TABLET | Freq: Three times a day (TID) | ORAL | 1 refills | Status: DC | PRN

## 2016-09-13 NOTE — Progress Notes (Signed)
Subjective:    Patient ID: [name]., male    DOB: [birth year], 46 y.o.   MRN: [id number]  HPI  46 y/o male with pmh of T8 SCI 2007 and psh of baclofen pump removal in 04/2013 presents for follow up of for management of his SCI.   Initially stated: His main issues were his postures and asymmetrical weakness and spasms.  Pt had his ITB pump removed due to inconvenience and stressors of withdrawal due to pump failure. [place] was living [place] and relocated to [place].  He is looking to establish care as well.  He has never had AD.  He does AM bowel program.  No movement or sensation below T8. He is independent, lives with his mother.  For spasms, he has tried oral baclofen 20mg  TID, but made him lethargic.  He took valium, which he usually took at night.    Last clinic visit 08/16/16.  Since last visit, he states he is liking his new cushion.  He plans to follow up for further adjustment.  He notes minimal improvement with Tizanidine, the weather has made things worse. He states he never heard back about FES for abdomen.  He obtained his Xray of his femur, which was relatively stable.    Pain Inventory Average Pain 7 Pain Right Now 6 My pain is intermittent, sharp, burning, stabbing and aching  In the last 24 hours, has pain interfered with the following? General activity 8 Relation with others 8 Enjoyment of life 8 What TIME of day is your pain at its worst? morning, night Sleep (in general) Poor  Pain is worse with: walking, bending, sitting, inactivity, standing and some activites Pain improves with: rest and therapy/exercise Relief from Meds: 3  Mobility do you drive?  yes use a wheelchair Do you have any goals in this area?  no  Function disabled: date disabled 02/28/2006 I need assistance with the following:  household duties Do you have any goals in this area?  no  Neuro/Psych bladder control problems weakness numbness tremor tingling spasms depression  Prior  Studies Any changes since last visit?  no  Physicians involved in your care Any changes since last visit?  no   No family history on file. Social History   Social History  . Marital status: Divorced    Spouse name: N/A  . Number of children: N/A  . Years of education: N/A   Social History Main Topics  . Smoking status: Current Every Day Smoker    Packs/day: 0.50    Types: Cigarettes  . Smokeless tobacco: Current User  . Alcohol use 0.0 oz/week  . Drug use: Unknown  . Sexual activity: Not Asked   Other Topics Concern  . None   Social History Narrative  . None   Past Surgical History:  Procedure Laterality Date  . PROGRAMABLE BACLOFEN PUMP REVISION Left 2014   Removal    Past Medical History:  Diagnosis Date  . Thoracic spinal cord injury (HCC)    BP 95/66   Pulse 98   SpO2 95%   Opioid Risk Score:   Fall Risk Score:  `1  Depression screen PHQ 2/9  Depression screen [facility] 2/9 07/05/2016 10/28/2015  Decreased Interest 1 1  Down, Depressed, Hopeless 1 1  PHQ - 2 Score 2 2  Altered sleeping - 2  Tired, decreased energy - 1  Change in appetite - 0  Feeling bad or failure about yourself  - 1  Trouble concentrating - 1  Moving slowly or fidgety/restless - 1  Suicidal thoughts - 0  PHQ-9 Score - 8  Difficult doing work/chores - Somewhat difficult    Review of Systems  Constitutional: Positive for diaphoresis.       Bladder control problems   HENT: Negative.   Eyes: Negative.   Respiratory: Negative.   Cardiovascular: Negative.   Gastrointestinal: Positive for abdominal pain.  Endocrine: Negative.   Genitourinary: Positive for difficulty urinating.  Musculoskeletal:       Spasms   Skin: Negative.   Allergic/Immunologic: Negative.   Neurological: Positive for tremors, weakness and numbness.       Tingling   Hematological: Negative.   Psychiatric/Behavioral: Positive for dysphoric mood.  All other systems reviewed and are negative.     Objective:     Physical Exam Gen: NAD. Vital signs reviewed HENT: Normocephalic, Atraumatic Eyes: EOMI. No discharge.  Cardio: RRR. No JVD.  Pulm: B/l clear to auscultation.  Effort normal Abd: Soft. BS+ MSK:  Gait nonambulatory.   No TTP.    No edema. Neuro: Sensation absent below T8 dermatomes  Strength  5/5 in all UE myotomes    0/5 in all LE myotomes  Spasms in B/l LE, making it difficult to assess spasticity, most pronounced in adductors, improved from prior to injections Skin: Warm and Dry. Intact.    Assessment & Plan:  [age] y/o male with pmh of T8 SCI 2007 and psh of baclofen pump removal in 04/2013 presents for follow up of for management of his SCI.  1. 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 Neurogenic Bladder  Cont to follow with Urology  3. Neurogenic bowel  Cont bowel reg with suppository  4. Spasticity/Spasms   Had ITB pump, but did not want to keep due to convenience and stressors of withdrawals  Valium ineffective, pt does not want to go back to addictive meds  Baclofen 30 TID d/ced due to lack of efficacy.  Cont 8mg  TID PRN  Likely exacerbated by gas and ulcers  Botox with temporary relief, will increase   Right adductor magnus: 75U, will increase to 150U   Left adductor magnus: 75U, will increase to 150U   Right external oblique: 50U, will increase to 100U   Will add rectus abdoimus at 75U    5. Abnormal posture  Cont HEP, new cushion obtainted.   Xrays reviewed, showing degenerative changes in L-spine  6. Sleep disturbance  See #1  7. Gas  Simethicone with minimal benefit  No able to tolerate Senna  Xray reviewed, normal bowel/gas patters  ?SMA, will cont to evaluate, now appears more likely given symptoms and association with food - CT abdomen  ordered  Encouraged again follow up for FES  8. Myositis ossificans  Xray reviewed, ?myositis ossificans, ultrasound denied, repeat film reviewed showing small MO

## 2016-09-15 ENCOUNTER — Ambulatory Visit (INDEPENDENT_AMBULATORY_CARE_PROVIDER_SITE_OTHER): Payer: Medicare Other | Admitting: Psychology

## 2016-09-15 ENCOUNTER — Telehealth: Payer: Self-pay | Admitting: Physical Medicine & Rehabilitation

## 2016-09-15 DIAGNOSIS — F3132 Bipolar disorder, current episode depressed, moderate: Secondary | ICD-10-CM | POA: Diagnosis not present

## 2016-09-15 DIAGNOSIS — G801 Spastic diplegic cerebral palsy: Secondary | ICD-10-CM

## 2016-09-15 NOTE — Telephone Encounter (Signed)
Patient called and [facility] doesn't have the referral for tens unit sent message to ap

## 2016-09-15 NOTE — Telephone Encounter (Signed)
I didn't know and they never have needed a separate referral for a TENS.  If they do, we can provide it, but this is the first that I'm hearing of this. Additionally, I may have even put it in the comments of my initial referral. Thanks.

## 2016-09-19 ENCOUNTER — Ambulatory Visit: Payer: Medicare Other | Admitting: Psychology

## 2016-09-20 ENCOUNTER — Ambulatory Visit (HOSPITAL_COMMUNITY)
Admission: RE | Admit: 2016-09-20 | Discharge: 2016-09-20 | Disposition: A | Discharge status: Home / Self Care | Payer: Worker's Compensation | Source: Ambulatory Visit | Attending: Physical Medicine & Rehabilitation | Admitting: Physical Medicine & Rehabilitation

## 2016-09-20 DIAGNOSIS — I745 Embolism and thrombosis of iliac artery: Secondary | ICD-10-CM | POA: Insufficient documentation

## 2016-09-20 DIAGNOSIS — R1084 Generalized abdominal pain: Secondary | ICD-10-CM | POA: Diagnosis not present

## 2016-09-20 DIAGNOSIS — I700 Atherosclerosis of aorta: Secondary | ICD-10-CM | POA: Diagnosis not present

## 2016-09-20 DIAGNOSIS — M47895 Other spondylosis, thoracolumbar region: Secondary | ICD-10-CM | POA: Diagnosis not present

## 2016-09-20 DIAGNOSIS — N400 Enlarged prostate without lower urinary tract symptoms: Secondary | ICD-10-CM | POA: Diagnosis not present

## 2016-09-20 DIAGNOSIS — R109 Unspecified abdominal pain: Secondary | ICD-10-CM | POA: Diagnosis not present

## 2016-09-20 MED ORDER — IOPAMIDOL (ISOVUE-300) INJECTION 61%
INTRAVENOUS | Status: AC
  Administered 2016-09-20: 100 mL
  Filled 2016-09-20: qty 100

## 2016-09-26 ENCOUNTER — Ambulatory Visit: Payer: Medicare Other | Admitting: Psychology

## 2016-10-03 ENCOUNTER — Ambulatory Visit: Payer: Medicare Other | Admitting: Psychology

## 2016-10-10 ENCOUNTER — Ambulatory Visit: Payer: Medicare Other | Admitting: Physical Therapy

## 2016-10-10 ENCOUNTER — Ambulatory Visit (INDEPENDENT_AMBULATORY_CARE_PROVIDER_SITE_OTHER): Payer: Medicare Other | Admitting: Psychology

## 2016-10-10 DIAGNOSIS — F3132 Bipolar disorder, current episode depressed, moderate: Secondary | ICD-10-CM

## 2016-10-10 NOTE — Therapy (Signed)
[facility] [address] [place], [place], [address] Phone: [phone]   Fax:  [phone]  Patient Details  Name: Matthew StandardCharles Straus Jr. MRN: [id number] Date of Birth: 09-06-1970 Referring Provider:  Marcello FennelPatel, Ankit Anil, MD  Encounter Date: 10/10/2016   Patient arrived for evaluation. Referral states "Please eval and treat with consideration for FES for gastric motility." Patient reports he has problems with lots of gas pain and hopes for pain relief.  Informed patient that I personally, and no other therapist in our clinic, has knowledge of use of FES for gastric motility. I have reached out to Dr.Patel for any literature he has on this and am working on a literature search as well.   Patient agreed to defer evaluation until we know more about the evidence for this treatment. He stated he has an appointment with Dr. Allena KatzPatel tomorrow and will also discuss with him.   Will plan to follow-up with patient re: proceeding with the evaluation (or not) as appropriate.        Zena AmosLynn P Jatara Huettner, PT 10/10/2016, 3:57 PM  [facility] [address] [place], [place], [address] Phone: [phone]   Fax:  [phone]

## 2016-10-11 ENCOUNTER — Encounter: Payer: Self-pay | Admitting: Physical Medicine & Rehabilitation

## 2016-10-11 ENCOUNTER — Encounter
Payer: Worker's Compensation | Attending: Physical Medicine & Rehabilitation | Admitting: Physical Medicine & Rehabilitation

## 2016-10-11 VITALS — BP 110/73 | HR 94

## 2016-10-11 DIAGNOSIS — R252 Cramp and spasm: Secondary | ICD-10-CM

## 2016-10-11 DIAGNOSIS — G801 Spastic diplegic cerebral palsy: Secondary | ICD-10-CM | POA: Diagnosis not present

## 2016-10-11 DIAGNOSIS — N319 Neuromuscular dysfunction of bladder, unspecified: Secondary | ICD-10-CM

## 2016-10-11 DIAGNOSIS — G8220 Paraplegia, unspecified: Secondary | ICD-10-CM

## 2016-10-11 DIAGNOSIS — K592 Neurogenic bowel, not elsewhere classified: Secondary | ICD-10-CM

## 2016-10-11 DIAGNOSIS — M62838 Other muscle spasm: Secondary | ICD-10-CM

## 2016-10-11 NOTE — Progress Notes (Signed)
Botox: Procedure Note Patient Name: Matthew StandardCharles Mclane Jr. DOB: 08-Oct-1970 MRN: [id number]   Procedure: Botulinum toxin administration Guidance: EMG Diagnosis: Spastic diplegia Date: 10/11/16 Attending: Maryla MorrowAnkit Patel, MD   Informed consent: Risks, benefits & options of the procedure are explained to the patient (and/or family). The patient elects to proceed with procedure. Risks include but are not limited to weakness, respiratory distress, dry mouth, ptosis, antibody formation, worsening of some areas of function. Benefits include decreased abnormal muscle tone, improved hygiene and positioning, decreased skin breakdown and, in some cases, decreased pain. Options include conservative management with oral antispasticity agents, phenol chemodenervation of nerve or at motor nerve branches. More invasive options include intrathecal balcofen adminstration for appropriate candidates. Surgical options may include tendon lengthening or transposition or, rarely, dorsal rhizotomy.   History/Physical Examination: 46 y.o. male with pmh of T8 SCI 2007 and psh of baclofen pump removal in 04/2013.  MAS: Limitted eval due to significant spasms  Previous Treatments: Therapy/Range of motion/Baclofen pump Indication for guidance: Target active muscules  Procedure: Botulinum toxin was mixed with preservative free saline with a dilution of 1cc to 100 units. Targeted limb and muscles were identified. The skin was prepped with alcohol swabs and placement of needle tip in targeted muscle was confirmed using appropriate guidance. Prior to injection, positioning of needle tip outside of blood vessel was determined by pulling back on syringe plunger.  MUSCLE UNITS Right adductor magnus: 150U Left adductor magnus: 150U Right external oblique: 100U Rectus abdoimus 75U  Total units used: 475 25U discarded due unavoidable waste Complications: None  Plan:  RTC 6 weeks for follow up eval  Discussed trial of abdominal  binder  Ankit Anil Patel 12:32 PM

## 2016-10-20 ENCOUNTER — Telehealth: Payer: Self-pay | Admitting: Physical Medicine & Rehabilitation

## 2016-10-20 NOTE — Telephone Encounter (Signed)
In reference to previous message, Dr. Allena KatzPatel does not want to do another CT at this time.  Wants to see how the botox goes, then plan accordingly.

## 2016-10-20 NOTE — Telephone Encounter (Signed)
ADV PATN STATES NO ONE HAS CONTACTED HIM REFERRAL IS IN SYSTEM AND [facility] CONTACTED HIM ON [phone] AND HE ADVISED THEM HE WOULD NEED TO CALL BACK TO SCHEDULE

## 2016-10-20 NOTE — Telephone Encounter (Signed)
Patient is calling our office to find out about the 2nd CT that he is supposed to have done.  I only show one referral in system for CT and he has already done it.  Please let me know if we are supposed to have another one done, so I can call patient.  Thank you.

## 2016-10-24 ENCOUNTER — Ambulatory Visit: Payer: Worker's Compensation | Admitting: Psychology

## 2016-10-31 ENCOUNTER — Ambulatory Visit (INDEPENDENT_AMBULATORY_CARE_PROVIDER_SITE_OTHER): Payer: Medicare Other | Admitting: Psychology

## 2016-10-31 DIAGNOSIS — F3132 Bipolar disorder, current episode depressed, moderate: Secondary | ICD-10-CM

## 2016-11-21 ENCOUNTER — Ambulatory Visit (INDEPENDENT_AMBULATORY_CARE_PROVIDER_SITE_OTHER): Payer: Worker's Compensation | Admitting: Psychology

## 2016-11-21 DIAGNOSIS — F3132 Bipolar disorder, current episode depressed, moderate: Secondary | ICD-10-CM | POA: Diagnosis not present

## 2016-11-23 ENCOUNTER — Encounter
Payer: Worker's Compensation | Attending: Physical Medicine & Rehabilitation | Admitting: Physical Medicine & Rehabilitation

## 2016-11-23 ENCOUNTER — Encounter: Payer: Self-pay | Admitting: Physical Medicine & Rehabilitation

## 2016-11-23 VITALS — BP 107/73 | HR 89

## 2016-11-23 DIAGNOSIS — G8220 Paraplegia, unspecified: Secondary | ICD-10-CM | POA: Diagnosis not present

## 2016-11-23 DIAGNOSIS — K592 Neurogenic bowel, not elsewhere classified: Secondary | ICD-10-CM | POA: Diagnosis not present

## 2016-11-23 DIAGNOSIS — R293 Abnormal posture: Secondary | ICD-10-CM | POA: Diagnosis not present

## 2016-11-23 DIAGNOSIS — M62838 Other muscle spasm: Secondary | ICD-10-CM

## 2016-11-23 DIAGNOSIS — N319 Neuromuscular dysfunction of bladder, unspecified: Secondary | ICD-10-CM | POA: Diagnosis not present

## 2016-11-23 DIAGNOSIS — G801 Spastic diplegic cerebral palsy: Secondary | ICD-10-CM | POA: Insufficient documentation

## 2016-11-23 DIAGNOSIS — R1084 Generalized abdominal pain: Secondary | ICD-10-CM | POA: Diagnosis not present

## 2016-11-23 DIAGNOSIS — G479 Sleep disorder, unspecified: Secondary | ICD-10-CM

## 2016-11-23 DIAGNOSIS — R252 Cramp and spasm: Secondary | ICD-10-CM

## 2016-11-23 MED ORDER — METOCLOPRAMIDE HCL 5 MG PO TABS
5.0000 mg | ORAL_TABLET | Freq: Three times a day (TID) | ORAL | 1 refills | Status: DC
Start: 2016-11-23 — End: 2016-11-23

## 2016-11-23 MED ORDER — METOCLOPRAMIDE HCL 5 MG PO TABS: 5.0000 mg | ORAL_TABLET | Freq: Three times a day (TID) | ORAL | 1 refills | Status: DC

## 2016-11-23 NOTE — Progress Notes (Signed)
Subjective:    Patient ID: Matthew Standardharles Green Jr., male    DOB: 17-Feb-1971, 46 y.o.   MRN: [id number]  HPI  46 y/o male with pmh of T8 SCI 2007 and psh of baclofen pump removal in 04/2013 presents for follow up of for management of his SCI.   Initially stated: His main issues were his postures and asymmetrical weakness and spasms.  Pt had his ITB pump removed due to inconvenience and stressors of withdrawal due to pump failure. [place].  He is looking to establish care as well.  He has never had AD.  He does AM bowel program.  No movement or sensation below T8. He is independent, lives with his mother.  For spasms, he has tried oral baclofen 20mg  TID, but made him lethargic.  He took valium, which he usually took at night.    Last clinic visit 08/16/16.  He was injected with Botox at this time.  He notes little improvement with the injection.  He continues to follow up with Urology. Gas is the same, he notes this is the most significant problem.    Pain Inventory Average Pain 7 Pain Right Now 8 My pain is intermittent, sharp, burning, stabbing and aching  In the last 24 hours, has pain interfered with the following? General activity 8 Relation with others 8 Enjoyment of life 8 What TIME of day is your pain at its worst? morning, night Sleep (in general) Poor  Pain is worse with: walking, bending, sitting, inactivity, standing and some activites Pain improves with: rest and therapy/exercise Relief from Meds: 3  Mobility do you drive?  yes use a wheelchair Do you have any goals in this area?  no  Function disabled: date disabled 02/28/2006 I need assistance with the following:  household duties Do you have any goals in this area?  no  Neuro/Psych bladder control problems weakness numbness tremor tingling spasms depression  Prior Studies Any changes since last visit?  no  Physicians involved in your care Any changes since last visit?   no   No family history on file. Social History   Social History  . Marital status: Divorced    Spouse name: N/A  . Number of children: N/A  . Years of education: N/A   Social History Main Topics  . Smoking status: Current Every Day Smoker    Packs/day: 0.50    Types: Cigarettes  . Smokeless tobacco: Current User  . Alcohol use 0.0 oz/week  . Drug use: Unknown  . Sexual activity: Not Asked   Other Topics Concern  . None   Social History Narrative  . None   Past Surgical History:  Procedure Laterality Date  . PROGRAMABLE BACLOFEN PUMP REVISION Left 2014   Removal    Past Medical History:  Diagnosis Date  . Thoracic spinal cord injury (HCC)    BP 107/73   Pulse 89   SpO2 97%   Opioid Risk Score:   Fall Risk Score:  `1  Depression screen PHQ 2/9  [facility] 07/05/2016 10/28/2015  Decreased Interest 1 1  Down, Depressed, Hopeless 1 1  PHQ - 2 Score 2 2  Altered sleeping - 2  Tired, decreased energy - 1  Change in appetite - 0  Feeling bad or failure about yourself  - 1  Trouble concentrating - 1  Moving slowly or fidgety/restless - 1  Suicidal thoughts - 0  PHQ-9 Score - 8  Difficult  doing work/chores - Somewhat difficult    Review of Systems  Constitutional: Positive for diaphoresis.       Bladder control problems   HENT: Negative.   Eyes: Negative.   Respiratory: Negative.   Cardiovascular: Negative.   Gastrointestinal: Positive for abdominal pain.  Endocrine: Negative.   Genitourinary: Positive for difficulty urinating.  Musculoskeletal:       Spasms   Skin: Negative.   Allergic/Immunologic: Negative.   Neurological: Positive for tremors, weakness and numbness.       Tingling   Hematological: Negative.   Psychiatric/Behavioral: Positive for dysphoric mood.  All other systems reviewed and are negative.     Objective:   Physical Exam Gen: NAD. Vital signs reviewed HENT: Normocephalic, Atraumatic Eyes: EOMI. No discharge.   Cardio: RRR. No JVD.  Pulm: B/l clear to auscultation.  Effort normal Abd: Soft. BS+ MSK:  Gait nonambulatory.   No TTP.    No edema. Neuro: Sensation absent below T8 dermatomes  Strength  5/5 in all UE myotomes    0/5 in all LE myotomes  Spasms in B/l LE, making it difficult to assess spasticity, most pronounced in adductors, improved from prior to injections Skin: Warm and Dry. Intact.    Assessment & Plan:  46 y/o male with pmh of T8 SCI 2007 and psh of baclofen pump removal in 04/2013 presents for follow up of for management of his SCI.  1. 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 Neurogenic Bladder  Cont to follow with Urology  3. Neurogenic bowel  Cont bowel reg with suppository  4. Spasticity/Spasms   Had ITB pump, but did not want to keep due to convenience and stressors of withdrawals  Valium ineffective, pt does not want to go back to addictive meds  Baclofen 30 TID d/ced due to lack of efficacy.  Cont 8mg  TID PRN  Likely exacerbated by gas   Botox with temporary relief:   Right adductor magnus: 150U   Left adductor magnus: 150U   Right external oblique: 100U   Rectus abdoimus 75U    5. Abnormal posture  Cont HEP, new cushion obtainted.   Xrays reviewed, showing degenerative changes in L-spine  6. Sleep disturbance  See #1  7. Gas  Simethicone with minimal benefit  No able to tolerate Senna  Xray reviewed, normal bowel/gas patters  ?SMA, will cont to evaluate, now appears possible given symptoms, previous CT reviewed, spoke with Radiology.  Will order CTA  Encouraged follow up for FES  Will order Reglan 5 4/day   Will consider GI referral  8. Myositis ossificans  Xray reviewed, ?myositis ossificans, ultrasound denied, repeat film reviewed showing small  MO

## 2016-11-23 NOTE — Addendum Note (Signed)
Addended by: Doreene ElandSHUMAKER, SYBIL W on: 11/23/2016 01:40 PM   Modules accepted: Orders

## 2016-11-28 ENCOUNTER — Ambulatory Visit: Payer: PRIVATE HEALTH INSURANCE | Admitting: Psychology

## 2016-12-05 ENCOUNTER — Ambulatory Visit (INDEPENDENT_AMBULATORY_CARE_PROVIDER_SITE_OTHER): Payer: Worker's Compensation | Admitting: Psychology

## 2016-12-05 DIAGNOSIS — F3132 Bipolar disorder, current episode depressed, moderate: Secondary | ICD-10-CM | POA: Diagnosis not present

## 2016-12-12 ENCOUNTER — Ambulatory Visit (INDEPENDENT_AMBULATORY_CARE_PROVIDER_SITE_OTHER): Payer: Worker's Compensation | Admitting: Psychology

## 2016-12-12 DIAGNOSIS — F3132 Bipolar disorder, current episode depressed, moderate: Secondary | ICD-10-CM | POA: Diagnosis not present

## 2016-12-19 ENCOUNTER — Ambulatory Visit: Payer: PRIVATE HEALTH INSURANCE | Admitting: Psychology

## 2016-12-22 ENCOUNTER — Encounter
Payer: Worker's Compensation | Attending: Physical Medicine & Rehabilitation | Admitting: Physical Medicine & Rehabilitation

## 2016-12-22 ENCOUNTER — Encounter: Payer: Self-pay | Admitting: Gastroenterology

## 2016-12-22 ENCOUNTER — Encounter: Payer: Self-pay | Admitting: Physical Medicine & Rehabilitation

## 2016-12-22 VITALS — BP 104/66 | HR 88 | Resp 14

## 2016-12-22 DIAGNOSIS — G479 Sleep disorder, unspecified: Secondary | ICD-10-CM | POA: Diagnosis not present

## 2016-12-22 DIAGNOSIS — G8220 Paraplegia, unspecified: Secondary | ICD-10-CM | POA: Diagnosis not present

## 2016-12-22 DIAGNOSIS — N319 Neuromuscular dysfunction of bladder, unspecified: Secondary | ICD-10-CM

## 2016-12-22 DIAGNOSIS — F17210 Nicotine dependence, cigarettes, uncomplicated: Secondary | ICD-10-CM | POA: Diagnosis not present

## 2016-12-22 DIAGNOSIS — R252 Cramp and spasm: Secondary | ICD-10-CM | POA: Diagnosis not present

## 2016-12-22 DIAGNOSIS — G801 Spastic diplegic cerebral palsy: Secondary | ICD-10-CM | POA: Diagnosis not present

## 2016-12-22 DIAGNOSIS — R1084 Generalized abdominal pain: Secondary | ICD-10-CM | POA: Diagnosis not present

## 2016-12-22 DIAGNOSIS — M62838 Other muscle spasm: Secondary | ICD-10-CM

## 2016-12-22 DIAGNOSIS — R293 Abnormal posture: Secondary | ICD-10-CM | POA: Diagnosis not present

## 2016-12-22 DIAGNOSIS — M619 Calcification and ossification of muscle, unspecified: Secondary | ICD-10-CM | POA: Diagnosis not present

## 2016-12-22 DIAGNOSIS — K592 Neurogenic bowel, not elsewhere classified: Secondary | ICD-10-CM | POA: Diagnosis not present

## 2016-12-22 MED ORDER — TIZANIDINE HCL 4 MG PO TABS: 8.0000 mg | ORAL_TABLET | Freq: Three times a day (TID) | ORAL | 1 refills | Status: DC | PRN

## 2016-12-22 NOTE — Progress Notes (Signed)
Subjective:    Patient ID: Matthew Standardharles Brosch Jr., male    DOB: 04/18/1971, 46 y.o.   MRN: [id number]  HPI  46 y/o male with pmh of T8 SCI 2007 and psh of baclofen pump removal in 04/2013 presents for follow up of for management of his SCI.   Initially stated: His main issues were his postures and asymmetrical weakness and spasms.  Pt had his ITB pump removed due to inconvenience and stressors of withdrawal due to pump failure. [place].  He is looking to establish care as well.  He has never had AD.  He does AM bowel program.  No movement or sensation below T8. He is independent, lives with his mother.  For spasms, he has tried oral baclofen 20mg  TID, but made him lethargic.  He took valium, which he usually took at night.    Last clinic visit 11/23/16.  Since last visit, pt states that he had further adjustment to his cushion, but no real difference. He was started on Reglan without benefit.  He did not have his CTA.    Pain Inventory Average Pain 7 Pain Right Now 8 My pain is intermittent, sharp, burning, stabbing and aching  In the last 24 hours, has pain interfered with the following? General activity 8 Relation with others 8 Enjoyment of life 8 What TIME of day is your pain at its worst? morning, night Sleep (in general) Poor  Pain is worse with: walking, bending, sitting, inactivity, standing and some activites Pain improves with: rest and therapy/exercise Relief from Meds: 3  Mobility do you drive?  yes use a wheelchair Do you have any goals in this area?  no  Function disabled: date disabled 02/28/2006 I need assistance with the following:  toileting, household duties and shopping Do you have any goals in this area?  no  Neuro/Psych bladder control problems weakness numbness tremor tingling spasms depression  Prior Studies Any changes since last visit?  no  Physicians involved in your care Any changes since last visit?   no   History reviewed. No pertinent family history. Social History   Social History  . Marital status: Divorced    Spouse name: N/A  . Number of children: N/A  . Years of education: N/A   Social History Main Topics  . Smoking status: Current Every Day Smoker    Packs/day: 0.50    Types: Cigarettes  . Smokeless tobacco: Current User  . Alcohol use 0.0 oz/week  . Drug use: Unknown  . Sexual activity: Not Asked   Other Topics Concern  . None   Social History Narrative  . None   Past Surgical History:  Procedure Laterality Date  . PROGRAMABLE BACLOFEN PUMP REVISION Left 2014   Removal    Past Medical History:  Diagnosis Date  . Thoracic spinal cord injury (HCC)    BP 104/66 (BP Location: Right Arm, Patient Position: Sitting, Cuff Size: Normal)   Pulse 88   Resp 14   SpO2 97%   Opioid Risk Score:   Fall Risk Score:  `1  Depression screen PHQ 2/9  [facility] 07/05/2016 10/28/2015  Decreased Interest 1 1  Down, Depressed, Hopeless 1 1  PHQ - 2 Score 2 2  Altered sleeping - 2  Tired, decreased energy - 1  Change in appetite - 0  Feeling bad or failure about yourself  - 1  Trouble concentrating - 1  Moving slowly or fidgety/restless -  1  Suicidal thoughts - 0  PHQ-9 Score - 8  Difficult doing work/chores - Somewhat difficult    Review of Systems  Constitutional: Positive for diaphoresis.       Bladder control problems   HENT: Negative.   Eyes: Negative.   Respiratory: Negative.   Cardiovascular: Negative.   Gastrointestinal: Positive for abdominal pain.  Endocrine: Negative.   Genitourinary: Positive for difficulty urinating.  Musculoskeletal:       Spasms   Skin: Negative.   Allergic/Immunologic: Negative.   Neurological: Positive for tremors, weakness and numbness.       Tingling   Hematological: Negative.   Psychiatric/Behavioral: Positive for dysphoric mood.  All other systems reviewed and are negative.     Objective:    Physical Exam Gen: NAD. Vital signs reviewed HENT: Normocephalic, Atraumatic Eyes: EOMI. No discharge.  Cardio: RRR. No JVD.  Pulm: B/l clear to auscultation.  Effort normal Abd: Soft. BS+ MSK:  Gait nonambulatory.   No TTP.    No edema. Neuro: Sensation absent below T8 dermatomes  Strength  5/5 in all UE myotomes    0/5 in all LE myotomes  Spasms in B/l LE, making it difficult to assess spasticity, most pronounced in adductors, improved from prior to injections Skin: Warm and Dry. Intact.    Assessment & Plan:  46 y/o male with pmh of T8 SCI 2007 and psh of baclofen pump removal in 04/2013 presents for follow up of for management of his SCI.   1. T8 Paraplegia  Cont HEP, postural exercises   Cont ROM, stretching  Cont follow up with Psychology   UDS +THC previously   Pt to call volunteer services for mentorship at hospital    Recent adjustments to wheelchair cushion   2. Neurogenic Bladder  Cont to follow with Urology  3. Neurogenic bowel  Cont bowel reg with suppository  4. Spasticity/Spasms   Had ITB pump, but did not want to keep due to convenience and stressors of withdrawals  Valium ineffective, pt does not want to go back to addictive meds  Baclofen 30 TID d/ced due to lack of efficacy.  Cont Tizanidine 8mg  TID PRN  Likely exacerbated by gas   Botox previously with temporary relief:   Right adductor magnus: 150U   Left adductor magnus: 150U   Right external oblique: 100U   Rectus abdoimus 75U    5. Abnormal posture  Cont HEP, new cushion obtainted.   Xrays reviewed, showing degenerative changes in L-spine  6. Sleep disturbance  See #1  7. Gas  Simethicone with minimal benefit  No able to tolerate Senna  Xray reviewed, normal bowel/gas patters  ?SMA, will cont to evaluate, now appears possible given symptoms, previous CT reviewed, spoke with Radiology.  Order CTA, encouraged patient to follow up  Encouraged follow up for FES  Reglan ineffective   Will  refer to GI   May need referral to [facility]  8. Myositis ossificans  Xray reviewed, ?myositis ossificans, ultrasound denied, repeat film reviewed showing small MO

## 2016-12-26 ENCOUNTER — Ambulatory Visit (INDEPENDENT_AMBULATORY_CARE_PROVIDER_SITE_OTHER): Payer: Worker's Compensation | Admitting: Psychology

## 2016-12-26 DIAGNOSIS — F3132 Bipolar disorder, current episode depressed, moderate: Secondary | ICD-10-CM

## 2017-01-02 ENCOUNTER — Ambulatory Visit: Payer: Self-pay | Admitting: Psychology

## 2017-01-09 ENCOUNTER — Ambulatory Visit: Payer: Self-pay | Admitting: Psychology

## 2017-01-16 ENCOUNTER — Ambulatory Visit: Payer: Self-pay | Admitting: Psychology

## 2017-01-23 ENCOUNTER — Ambulatory Visit (INDEPENDENT_AMBULATORY_CARE_PROVIDER_SITE_OTHER): Payer: Medicare Other | Admitting: Psychology

## 2017-01-23 DIAGNOSIS — F3132 Bipolar disorder, current episode depressed, moderate: Secondary | ICD-10-CM | POA: Diagnosis not present

## 2017-01-30 ENCOUNTER — Ambulatory Visit: Payer: Self-pay | Admitting: Psychology

## 2017-02-06 ENCOUNTER — Ambulatory Visit: Payer: Self-pay | Admitting: Psychology

## 2017-02-09 ENCOUNTER — Encounter
Payer: Worker's Compensation | Attending: Physical Medicine & Rehabilitation | Admitting: Physical Medicine & Rehabilitation

## 2017-02-13 ENCOUNTER — Ambulatory Visit (INDEPENDENT_AMBULATORY_CARE_PROVIDER_SITE_OTHER): Payer: PRIVATE HEALTH INSURANCE | Admitting: Psychology

## 2017-02-13 DIAGNOSIS — F3132 Bipolar disorder, current episode depressed, moderate: Secondary | ICD-10-CM

## 2017-02-14 ENCOUNTER — Ambulatory Visit (INDEPENDENT_AMBULATORY_CARE_PROVIDER_SITE_OTHER): Payer: Medicare Other | Admitting: Gastroenterology

## 2017-02-14 ENCOUNTER — Encounter: Payer: Self-pay | Admitting: Gastroenterology

## 2017-02-14 VITALS — BP 96/68 | HR 88

## 2017-02-14 DIAGNOSIS — R140 Abdominal distension (gaseous): Secondary | ICD-10-CM

## 2017-02-14 DIAGNOSIS — K5901 Slow transit constipation: Secondary | ICD-10-CM

## 2017-02-14 NOTE — Progress Notes (Signed)
[facility] Consult Note:  History: [name]. 02/14/2017  Referring physician: Marcello Fennel, MD  Reason for consult/chief complaint: Abdominal Pain (Left sided pain with gas and bloating, patient had Baclofen pump for 5 years)   Subjective  HPI:  [name] was referred by his rehabilitation physician noted above for abdominal bloating and constipation. He suffered a spinal injury in 2007 causing paraplegia, and had a baclofen pump until it was removed 5 years ago. He has had constipation for many years, and gets good effect from a suppository that he takes nightly. With that he feels good evacuation of the stool. Despite that, since the baclofen pump was removed, he is bothered by left-sided abdominal bloating and feelings of gas retention. He has to put pressure on this area to get relief from the discomfort. He has tried Senokot without improvement, and that just gave him urgency and accidents. Gas-X offered no relief either. He denies rectal bleeding, vomiting, anorexia or weight loss.  ROS:  Review of Systems He denies chest pain dyspnea or dysuria. He has chronic spasticity Past Medical History: Past Medical History:  Diagnosis Date  . Thoracic spinal cord injury [facility])      Past Surgical History: Past Surgical History:  Procedure Laterality Date  . PROGRAMABLE BACLOFEN PUMP REVISION Left 2014   Removal      Family History: History reviewed. No pertinent family history.  Social History: Social History   Social History  . Marital status: Divorced    Spouse name: N/A  . Number of children: N/A  . Years of education: N/A   Social History Main Topics  . Smoking status: Current Every Day Smoker    Packs/day: 0.50    Types: Cigarettes  . Smokeless tobacco: Current User  . Alcohol use 0.0 oz/week  . Drug use: Yes    Types: Marijuana  . Sexual activity: Not Asked   Other Topics Concern  . None   Social History Narrative  .  None   He moved to this area from [place] to be closer to family.  Allergies: No Known Allergies  Outpatient Meds: No current outpatient prescriptions on file.   No current facility-administered medications for this visit.       ___________________________________________________________________ Objective   Exam:  BP 96/68   Pulse 88    General: this is a(n) Pleasant and well-appearing wheelchair-bound man   Eyes: sclera anicteric, no redness  ENT: oral mucosa moist without lesions, no cervical or supraclavicular lymphadenopathy, good dentition  CV: RRR without murmur, S1/S2, no JVD, no peripheral edema  Resp: clear to auscultation bilaterally, normal RR and effort noted  GI: soft, asymmetry of the abdominal wall with protuberance of the left side. No tenderness, with active bowel sounds. No guarding or palpable organomegaly noted, exam limited by positioning. Left lower quadrant abdominal wall scar at the site of his former baclofen pump.  Skin; warm and dry, no rash or jaundice noted  Loss of muscle mass in lower extremities  Radiologic Studies:  CTAP May 2018: IMPRESSION: 1. No acute  findings. 2. Chronic wall thickening of the urinary bladder with prostatic enlargement suggesting a degree of bladder outlet obstruction. 3. 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 4. Stable postop and degenerative changes in the visualized thoracolumbar spine as above.  Also moderate stool retention  Images personally reviewed. No evidence of obstruction or mass  Assessment: Encounter Diagnoses  Name Primary?  . Slow transit constipation Yes  .  Abdominal bloating    The symptoms appear to be from asymmetric weakness of the left sided abdominal wall, which was the location of his former baclofen pump. He also has decreased intestinal motility from his spinal cord injury. There is no apparent obstruction on imaging, and  the fact that he has good bowel evacuation with suppositories also speaks against obstruction. I do not think I have any further therapy to offer. He inquired about whether an abdominal binder might help keep pressure on this area, since pushing and holding the left side of the abdominal wall gives him immediate relief of the bloating and discomfort. I agree that might be helpful, and he will check with a local medical supply store. If he needs a prescription for that, I will provide it. I am afraid that I do not think I can be of much more assistance to him with this.  Thank you for the courtesy of this consult.  Please call me with any questions or concerns.  [name]  CC: Marcello FennelPatel, Ankit Anil, MD

## 2017-02-14 NOTE — Patient Instructions (Signed)
If you are age 48 or older, your body mass index should be between 23-30. Your There is no height or weight on file to calculate BMI. If this is out of the aforementioned range listed, please consider follow up with your Primary Care Provider.  If you are age 48 or younger, your body mass index should be between 19-25. Your There is no height or weight on file to calculate BMI. If this is out of the aformentioned range listed, please consider follow up with your Primary Care Provider.  Follow as needed.  Thank you.

## 2017-02-20 ENCOUNTER — Ambulatory Visit: Payer: Self-pay | Admitting: Psychology

## 2017-02-26 IMAGING — CR DG LUMBAR SPINE 2-3V
3 series · 3 of 3 positions shown · non-contrast
Comparison: CT abdomen pelvis 09/13/2015

CLINICAL DATA: Flaccid paraplegia incomplete thoracic level. T8
spinal cord injury.

EXAM:
LUMBAR SPINE - 2-3 VIEW

[l-spine ap]
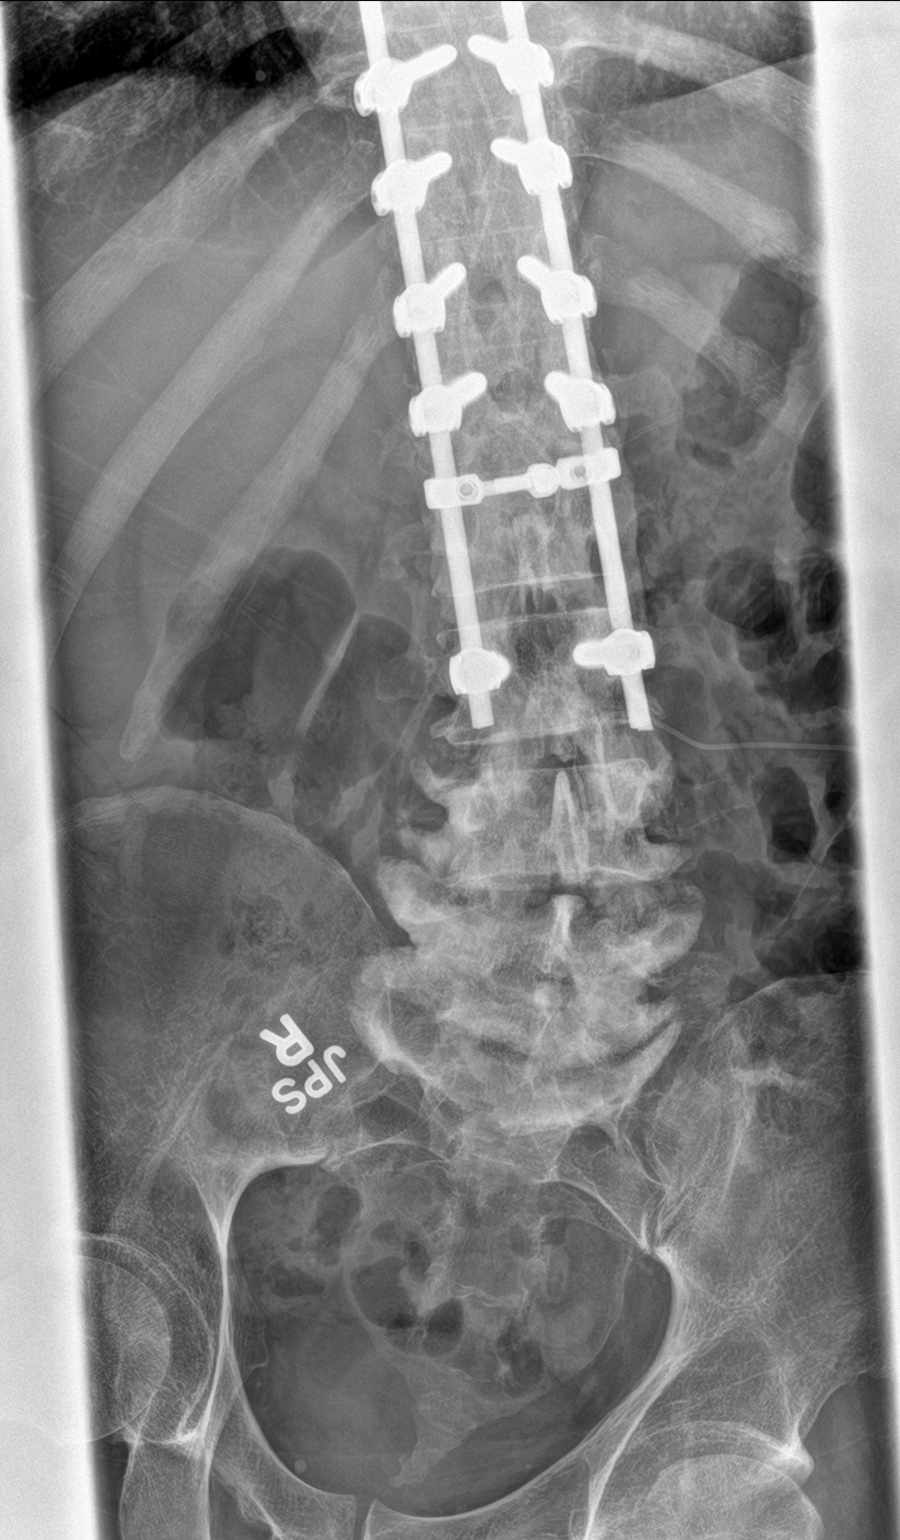

[l-spine lat]
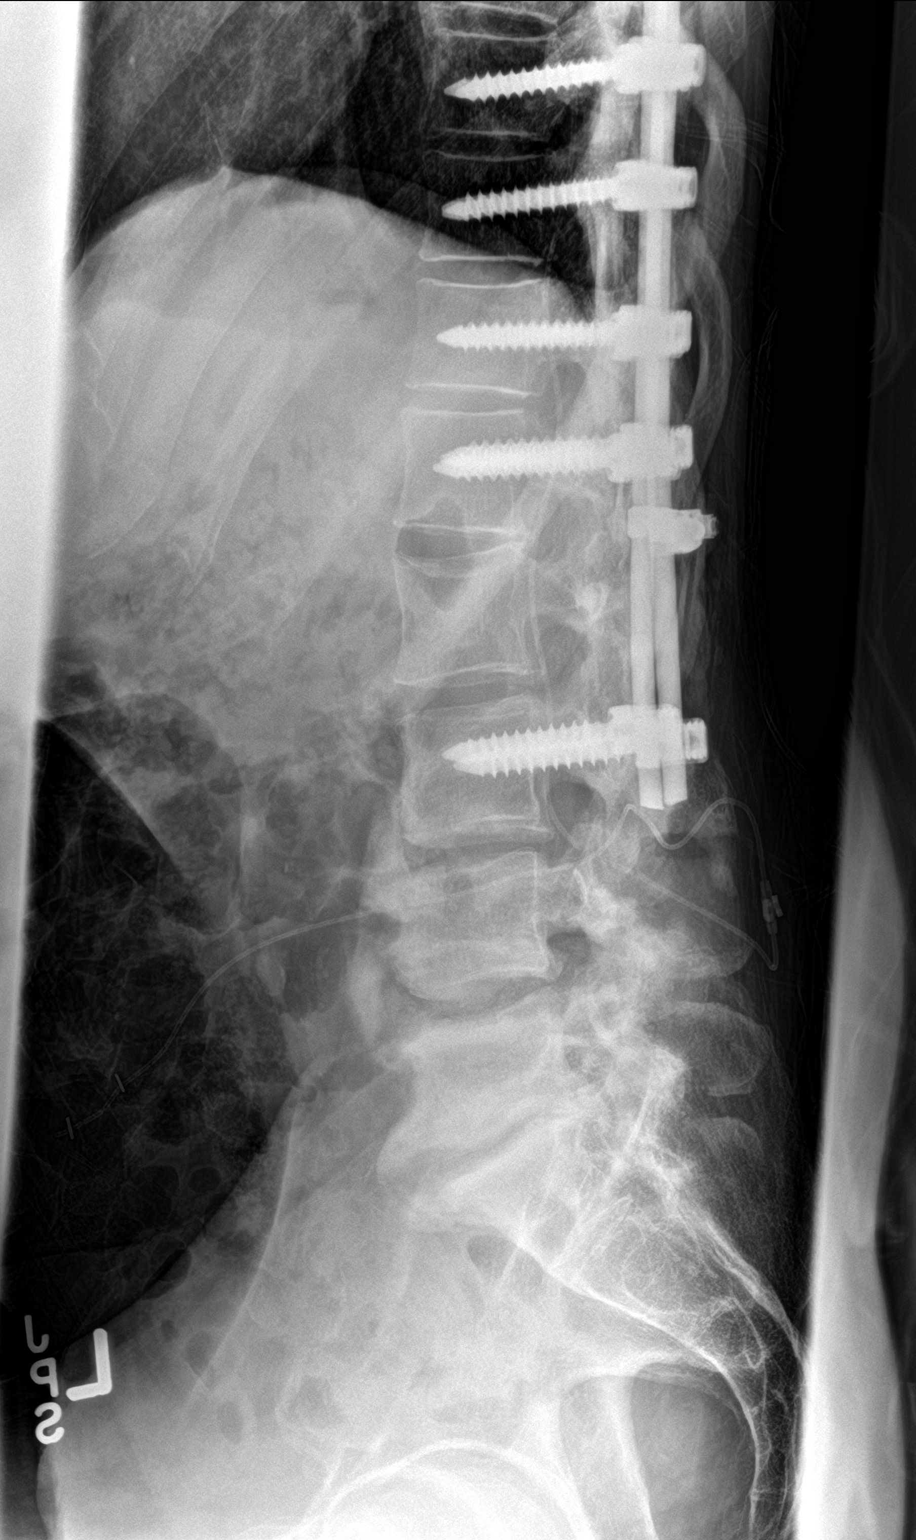

[l-spine spot]
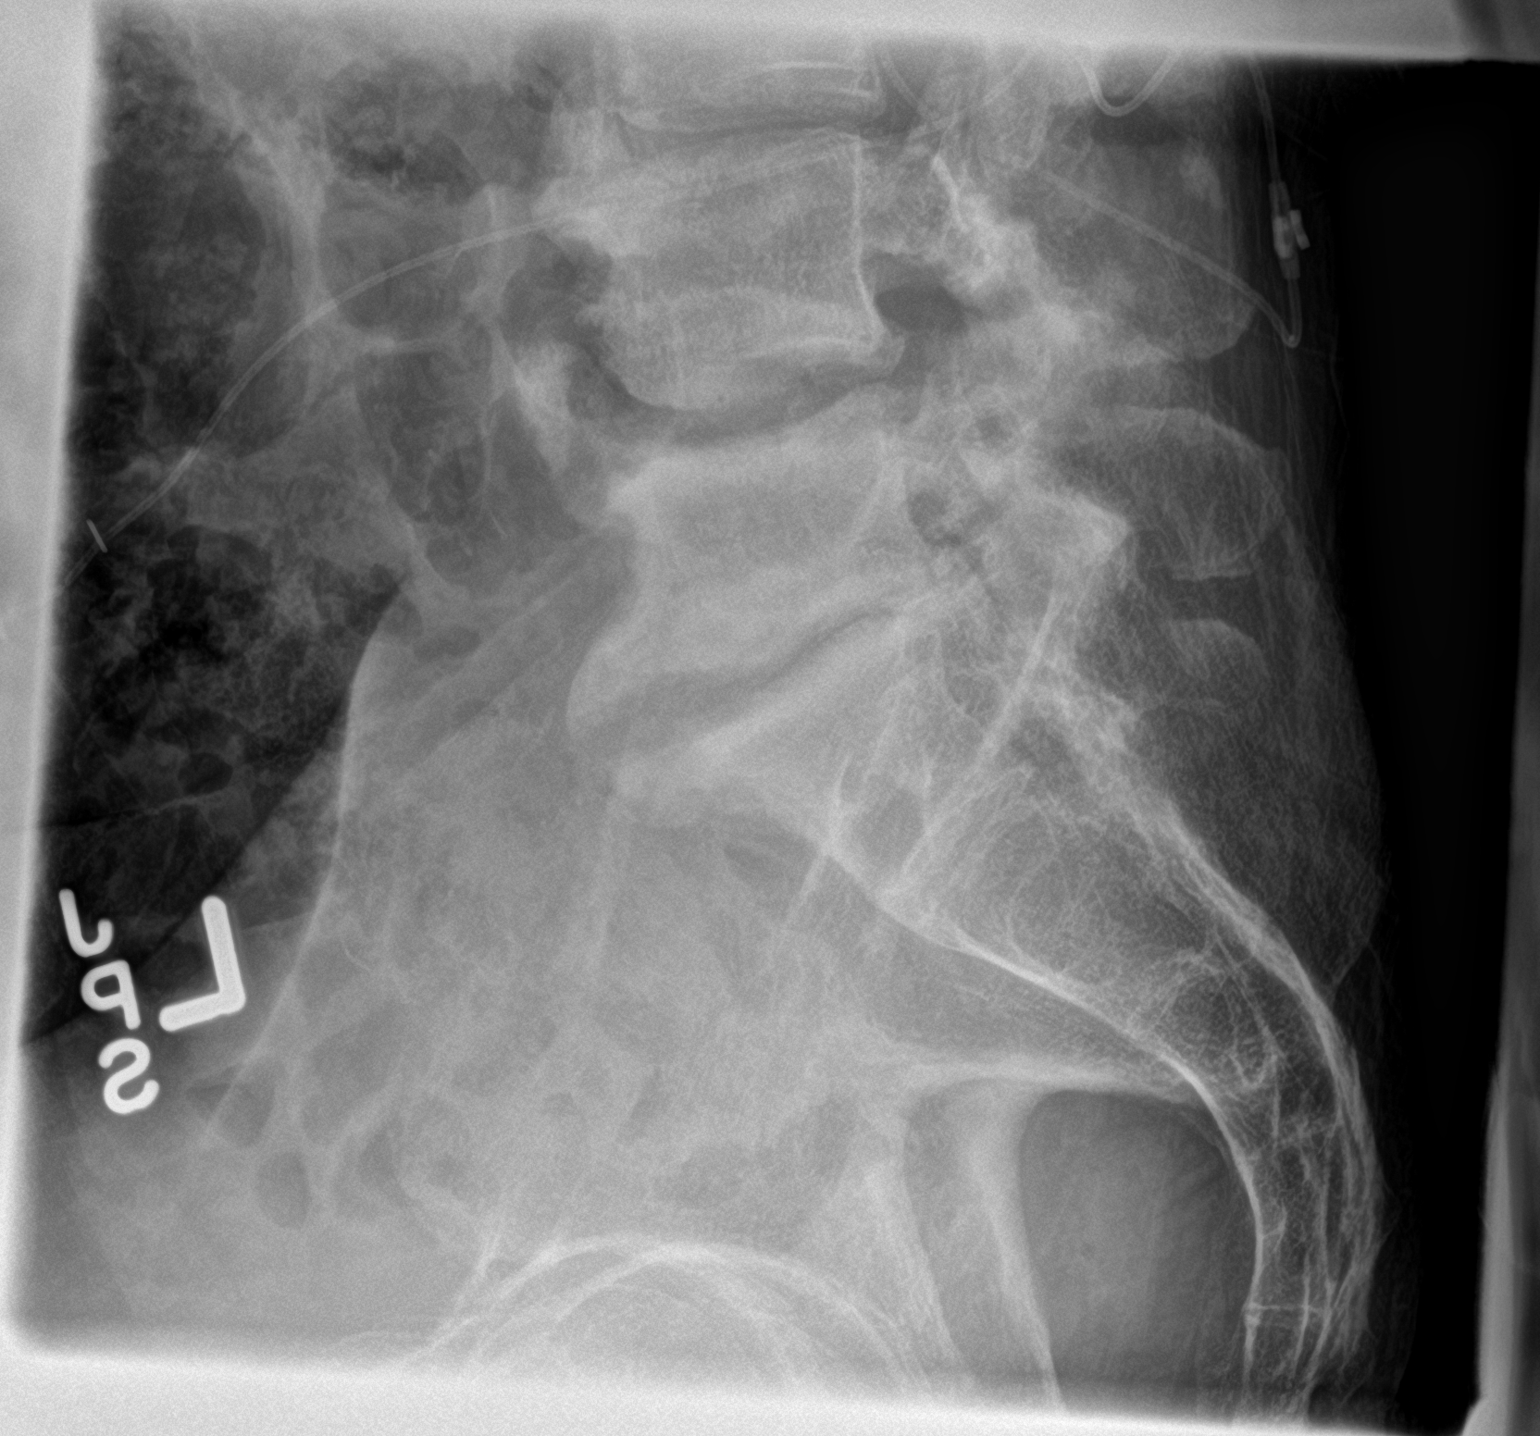

[3 of 3 positions shown; findings below may reference images not displayed]

FINDINGS: Bilateral pedicle screw and rod fusion extending from the thoracic
spine to the L3 level. Upper extent of the fusion hardware is not
imaged. No fracture in the lumbar spine.

Disc degeneration and spurring at L3-4, L4-5. Severe disc
degeneration and spurring L5-S1. Facet degeneration L4-5 and L5-S1.

Lumbar levoscoliosis at L3-4.

Intraspinal catheter extending into the thoracic spine.
IMPRESSION: Lumbar scoliosis.  Disc degeneration at L3-4, L4-5, and L5-S1

Thoracolumbar pedicle screw and rod fusion.

## 2017-02-26 IMAGING — CR DG HIP (WITH OR WITHOUT PELVIS) 2-3V*R*
3 series · 3 of 3 positions shown · non-contrast
Comparison: None.

CLINICAL DATA: Right hip pain, no injury

EXAM:
DG HIP (WITH OR WITHOUT PELVIS) 2-3V RIGHT

[pelvis ap]
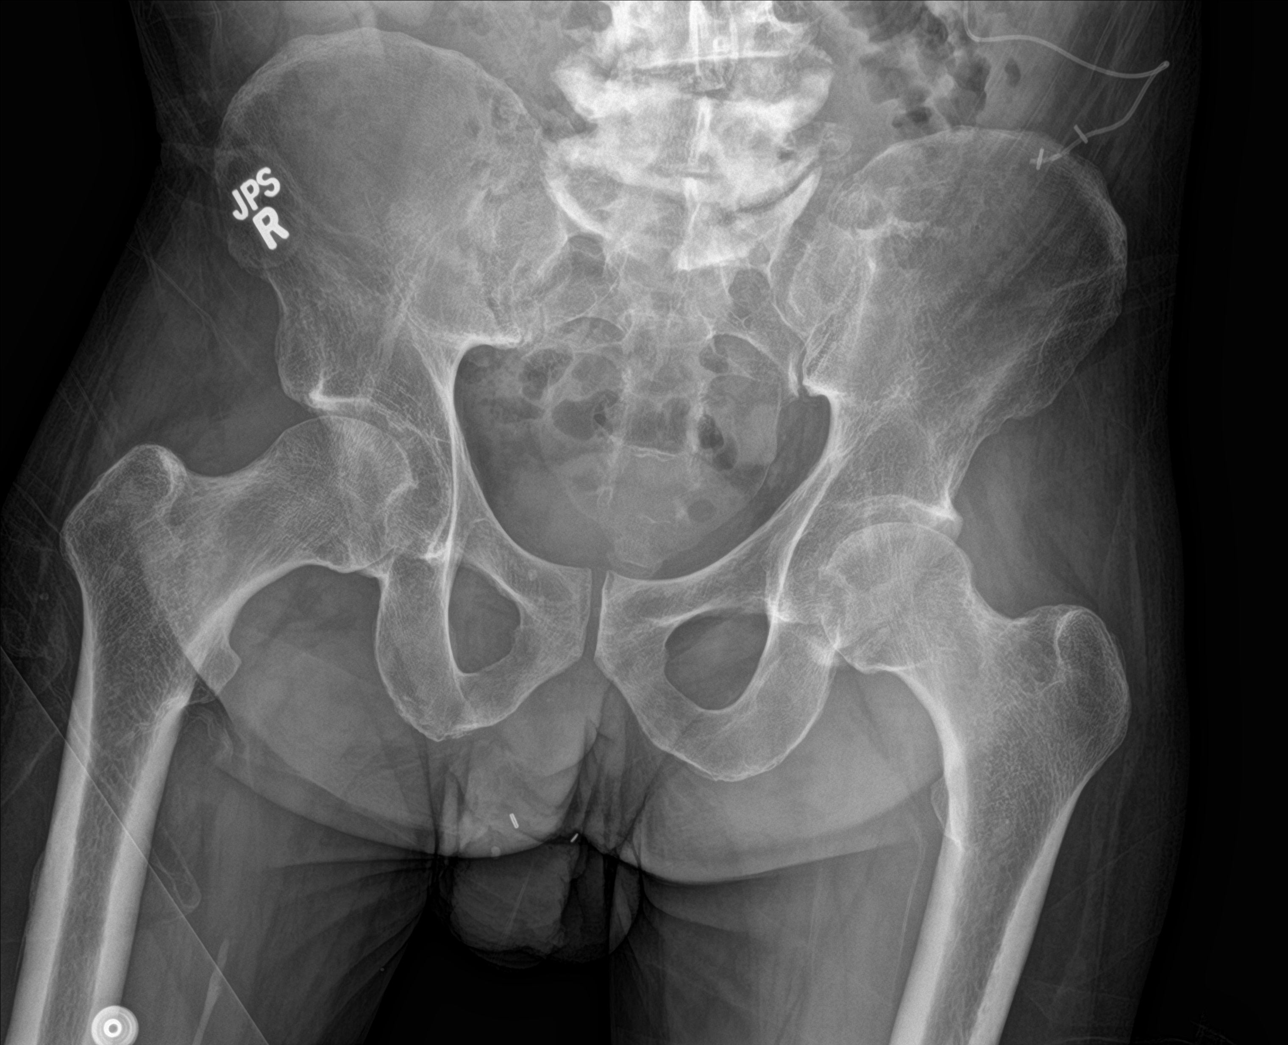

[hip ap]
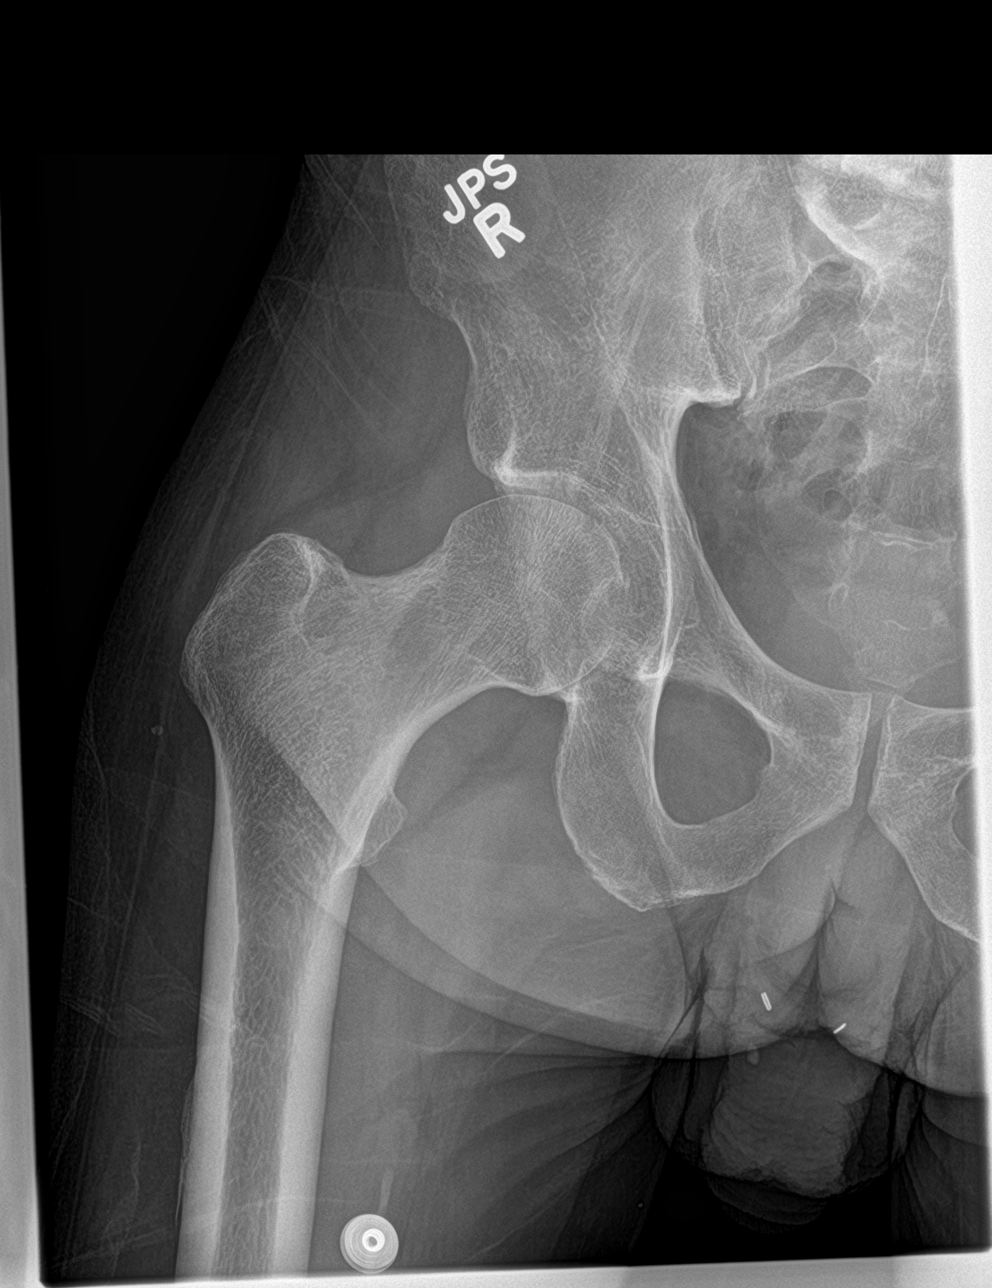

[hip lat]
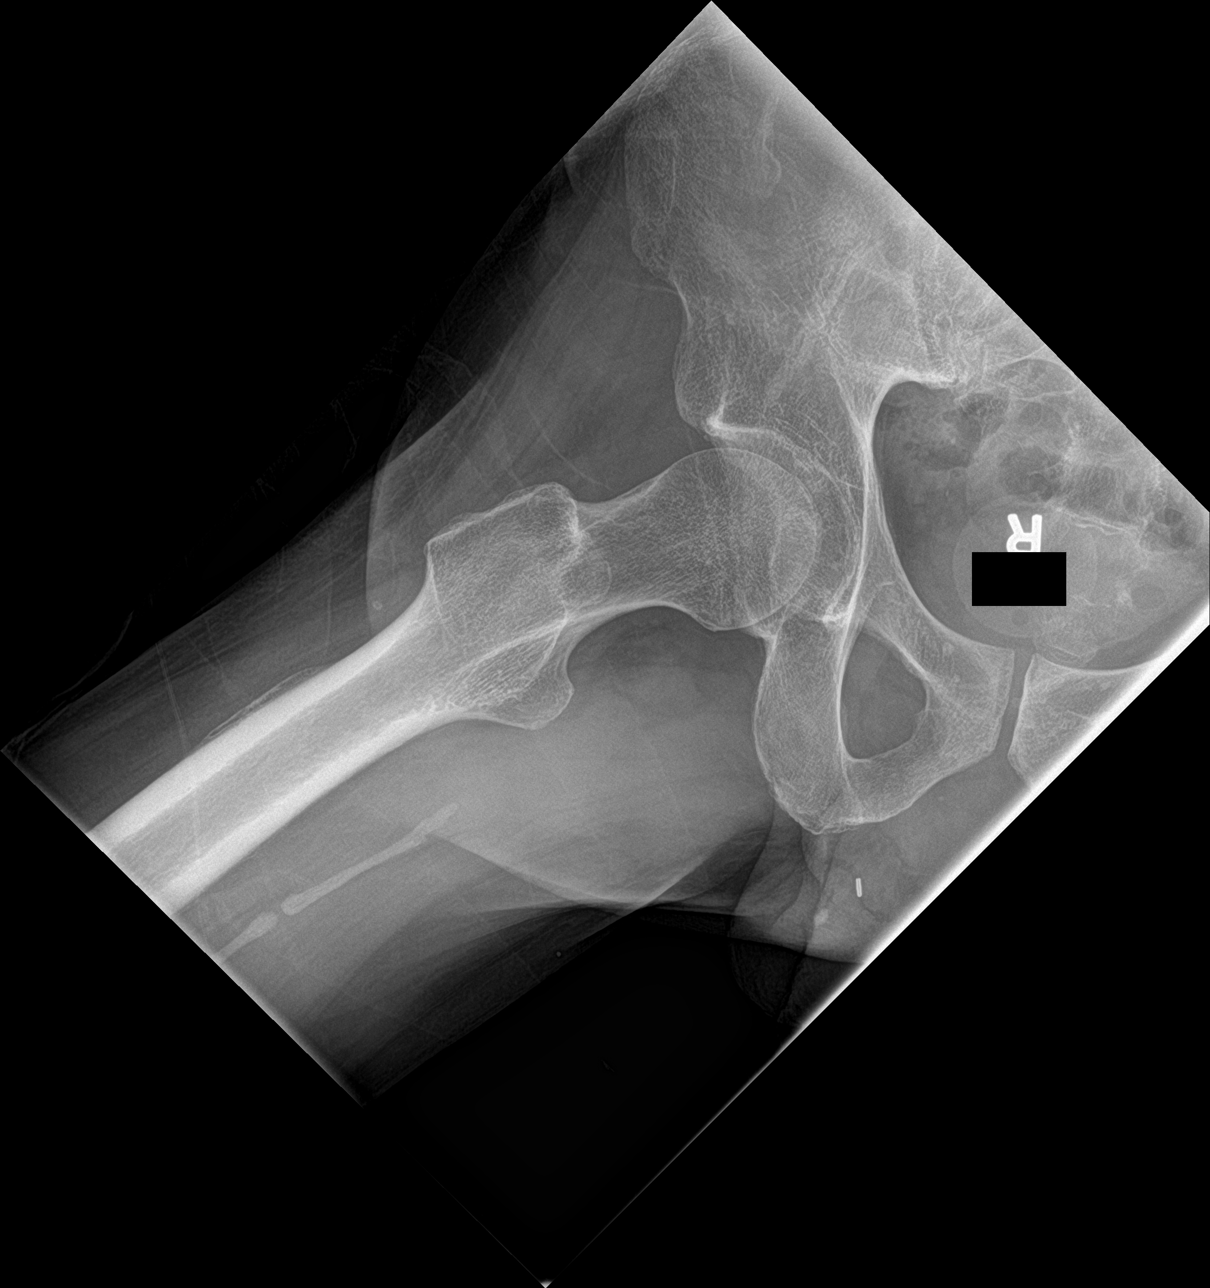

[3 of 3 positions shown; findings below may reference images not displayed]

FINDINGS: The bones are diffusely osteopenic. No acute fracture is seen. Both
femoral heads are in normal position. The pelvic rami are intact.
The SI joints are corticated. There are advanced degenerative
changes in the lower lumbar spine. Linear appearing calcification in
the medial right upper thigh and may be due to myositis ossificans.
IMPRESSION: 1. No acute abnormality.
2. Advanced degenerative change of the lower lumbar spine for age.
3. Question myositis ossificans within the soft tissues of the upper
thigh on the right medially.

## 2017-02-27 ENCOUNTER — Ambulatory Visit: Payer: Self-pay | Admitting: Psychology

## 2017-03-06 ENCOUNTER — Ambulatory Visit: Payer: Self-pay | Admitting: Psychology

## 2017-03-13 ENCOUNTER — Ambulatory Visit: Payer: Self-pay | Admitting: Psychology

## 2017-03-29 ENCOUNTER — Encounter: Payer: Medicare Other | Attending: Physical Medicine & Rehabilitation | Admitting: Physical Medicine & Rehabilitation

## 2017-04-06 ENCOUNTER — Ambulatory Visit: Payer: Medicare Other | Admitting: Psychology

## 2017-06-13 ENCOUNTER — Encounter (HOSPITAL_COMMUNITY): Payer: Self-pay | Admitting: Nurse Practitioner

## 2017-06-13 ENCOUNTER — Emergency Department (HOSPITAL_COMMUNITY)
Admission: EM | Admit: 2017-06-13 | Discharge: 2017-06-13 | Disposition: A | Discharge status: Home / Self Care | Payer: Medicare Other | Attending: Emergency Medicine | Admitting: Emergency Medicine

## 2017-06-13 ENCOUNTER — Other Ambulatory Visit: Payer: Self-pay

## 2017-06-13 DIAGNOSIS — F329 Major depressive disorder, single episode, unspecified: Secondary | ICD-10-CM | POA: Diagnosis present

## 2017-06-13 DIAGNOSIS — F4321 Adjustment disorder with depressed mood: Secondary | ICD-10-CM | POA: Diagnosis not present

## 2017-06-13 DIAGNOSIS — F332 Major depressive disorder, recurrent severe without psychotic features: Secondary | ICD-10-CM | POA: Insufficient documentation

## 2017-06-13 DIAGNOSIS — R45851 Suicidal ideations: Secondary | ICD-10-CM | POA: Diagnosis not present

## 2017-06-13 DIAGNOSIS — F17210 Nicotine dependence, cigarettes, uncomplicated: Secondary | ICD-10-CM | POA: Diagnosis not present

## 2017-06-13 HISTORY — DX: Depression, unspecified: F32.A

## 2017-06-13 HISTORY — DX: Major depressive disorder, single episode, unspecified: F32.9

## 2017-06-13 NOTE — ED Provider Notes (Signed)
[facility]-EMERGENCY DEPT Provider Note   CSN: [id number] Arrival date & time: 06/13/17  0804     History   Chief Complaint Chief Complaint  Patient presents with  . Suicidal    HPI [name]. is a 47 y.o. male.  HPI Patient brought in by police for suicidal statements.  States that he had just on a trip on the [place] with a girl.  States that she told him he that she only wanted to be friends.  States he thought that it was more than that.  He has called her and made some suicidal statements.  He is a paraplegic due to previous work accident.  States it is tough to be in the chair.  He does see a therapist at [facility].  States the next appointment with her is in a week and a half.  Denies substance abuse.  States his life is difficult with the wheelchair.  Has had a history of depression.  States he does have suicidal thoughts does not think he would do it. Past Medical History:  Diagnosis Date  . Depression   . Thoracic spinal cord injury [facility])     Patient Active Problem List   Diagnosis Date Noted  . Adjustment disorder with depressed mood 06/13/2017  . Stage III pressure ulcer of sacral region (HCC) 11/05/2015  . Open wound of lower back and pelvis w/o penentrat into retroperitoneum 10/12/2015    Past Surgical History:  Procedure Laterality Date  . PROGRAMABLE BACLOFEN PUMP REVISION Left 2014   Removal        Home Medications    Prior to Admission medications   Not on File    Family History History reviewed. No pertinent family history.  Social History Social History   Tobacco Use  . Smoking status: Current Every Day Smoker    Packs/day: 0.50    Types: Cigarettes  . Smokeless tobacco: Current User  Substance Use Topics  . Alcohol use: No    Alcohol/week: 0.0 oz    Frequency: Never  . Drug use: Yes    Comment: Pt denied; UDS not available     Allergies   Patient has no known  allergies.   Review of Systems Review of Systems  Constitutional: Negative for appetite change.  HENT: Negative for dental problem.   Respiratory: Negative for chest tightness.   Gastrointestinal: Negative for abdominal distention.  Genitourinary: Negative for flank pain.  Skin: Negative for color change.  Neurological: Negative for seizures.  Hematological: Negative for adenopathy.  Psychiatric/Behavioral: Positive for dysphoric mood and suicidal ideas. Negative for hallucinations.     Physical Exam Updated Vital Signs BP 128/88   Pulse 81   Temp 98.2 F (36.8 C) (Oral)   Resp 18   SpO2 99%   Physical Exam  Constitutional: He appears well-developed.  HENT:  Head: Atraumatic.  Eyes: Pupils are equal, round, and reactive to light.  Neck: Neck supple.  Cardiovascular: Normal rate.  Pulmonary/Chest: Effort normal.  Abdominal: Soft. There is no tenderness.  Musculoskeletal:  Patient is paraplegic  Neurological: He is alert.  Skin: Skin is warm. Capillary refill takes less than 2 seconds.     ED Treatments / Results  Labs (all labs ordered are listed, but only abnormal results are displayed) Labs Reviewed - No data to display  EKG  EKG Interpretation None       Radiology No results found.  Procedures Procedures (including critical care time)  Medications  Ordered in ED Medications - No data to display   Initial Impression / Assessment and Plan / ED Course  I have reviewed the triage vital signs and the nursing notes.  Pertinent labs & imaging results that were available during my care of the patient were reviewed by me and considered in my medical decision making (see chart for details).     Patient is depressed and suicidal per patient.  Medically cleared.  To be seen by TTS.  Has follow-up with but states it is too far away.  Has soft suicidal thoughts.  Final Clinical Impressions(s) / ED Diagnoses   Final diagnoses:  Adjustment disorder with  depressed mood    ED Discharge Orders        Ordered    Increase activity slowly     06/13/17 0930    Diet - low sodium heart healthy     06/13/17 0930    Discharge instructions    Comments:  Discharge home   06/13/17 0930       Benjiman CorePickering, Seerat Peaden, MD 06/13/17 [phone]

## 2017-06-13 NOTE — BH Assessment (Signed)
Assessment Note  Matthew StandardCharles Traeger Jr. is a 47 y.o. male who [facility] escort (but not involuntary) after expressing passive suicidal ideation to an ex-girlfriend earlier today.  Pt was last assessed by TTS in August 2017.  At that time, he was brought to the hospital under IVC for threatening his mother.  He was released with outpatient resources.  Pt lives in [place] with his mother.  He is confined to a wheelchair due to a work-related accident that occurred 11 years ago.  Pt stated that he has a history of psychiatric care and suicidal ideation.  Most recently, he was treated inpatient five years ago after attempting suicide.  Pt also reported that he receives outpatient psychiatric/therapy services through [facility].  Pt presented to the ED today because his ex-girlfriend called his police.  Pt stated that he just [place], and after he returned, his girlfriend announced that she just wanted to be friends.  This occurred a week ago.  Since then, Pt reported that he has experienced the following symptoms:  Despondency; insomnia; poor appetite; passive suicidal ideation ("I wish I were dead") without plan or intent; tearfulness.  Pt denied auditory/visual hallucination, homicidal ideation, self-injurious behavior, and substance use concerns.  Pt admitted to past suicide attempts, but his last attempt was five years ago.  Pt stated that he has an upcoming appointment with [facility] within a week, and he asked to be discharged.  During assessment, Pt presented as alert and oriented.  He had good eye contact and was cooperative.  Pt was dressed in street clothes and appeared appropriately groomed.  He was in a wheelchair.  Pt's mood was sad.  Affect was congruent with mood.  Pt endorsed depressive symptoms.  Pt's speech was normal in rate, rhythm, and volume.  Pt's thought processes were within normal range, and thought content was  logical and goal-oriented.  There was no evidence of delusion.  Pt's memory and concentration were intact.  Pt's insight, judgment, and impulse control were deemed fair.  Consulted with Narda AmberJ. Norman, MD, who determined that Pt does not meet inpatient criteria.  Diagnosis: 32.1 MDD, Recurrent, Severe, w/o psychotic features  Past Medical History:  Past Medical History:  Diagnosis Date  . Depression   . Thoracic spinal cord injury [facility](HCC)     Past Surgical History:  Procedure Laterality Date  . PROGRAMABLE BACLOFEN PUMP REVISION Left 2014   Removal     Family History: History reviewed. No pertinent family history.  Social History:  reports that he has been smoking cigarettes.  He has been smoking about 0.50 packs per day. He uses smokeless tobacco. He reports that he uses drugs. He reports that he does not drink alcohol.  Additional Social History:  Alcohol / Drug Use Pain Medications: See MAR Prescriptions: See MAR Over the Counter: See MAR History of alcohol / drug use?: Yes Substance #1 Name of Substance 1: Marijuana  CIWA:   COWS:    Allergies: No Known Allergies  Home Medications:  (Not in a hospital admission)  OB/GYN Status:  No LMP for male patient.  General Assessment Data Location of Assessment: [facility] ED TTS Assessment: In system Is this a Tele or Face-to-Face Assessment?: Face-to-Face Is this an Initial Assessment or a Re-assessment for this encounter?: Initial Assessment Marital status: Divorced Is patient pregnant?: No Pregnancy Status: No Living Arrangements: Parent(Lives with mother) Can pt return to current living arrangement?: Yes Admission  Status: Voluntary Is patient capable of signing voluntary admission?: Yes Referral Source: Self/Family/Friend Insurance type: ([place] [facility])     Crisis Care Plan Living Arrangements: Parent(Lives with mother) Name of Psychiatrist: [facility] Name of Therapist: [facility]  Education Status Is patient  currently in school?: No  Risk to self with the past 6 months Suicidal Ideation: No-Not Currently/Within Last 6 Months(Some recent passive ideation) Has patient been a risk to self within the past 6 months prior to admission? : No Suicidal Intent: No Has patient had any suicidal intent within the past 6 months prior to admission? : No Is patient at risk for suicide?: No Suicidal Plan?: No Has patient had any suicidal plan within the past 6 months prior to admission? : No Access to Means: No What has been your use of drugs/alcohol within the last 12 months?: Denied Previous Attempts/Gestures: Yes How many times?: 1 Triggers for Past Attempts: Other (Comment) Intentional Self Injurious Behavior: None Family Suicide History: No Recent stressful life event(s): Loss (Comment), Other (Comment)(Breakup with girlfriend) Persecutory voices/beliefs?: No Depression: Yes Depression Symptoms: Despondent, Insomnia, Tearfulness, Feeling worthless/self pity Substance abuse history and/or treatment for substance abuse?: No Suicide prevention information given to non-admitted patients: Not applicable  Risk to Others within the past 6 months Homicidal Ideation: No Does patient have any lifetime risk of violence toward others beyond the six months prior to admission? : No Thoughts of Harm to Others: No Current Homicidal Intent: No Current Homicidal Plan: No Access to Homicidal Means: No History of harm to others?: No Assessment of Violence: None Noted Does patient have access to weapons?: No Criminal Charges Pending?: No Does patient have a court date: No Is patient on probation?: No  Psychosis Hallucinations: None noted Delusions: None noted  Mental Status Report Appearance/Hygiene: Unremarkable, Other (Comment)(street clothes) Eye Contact: Good Motor Activity: Freedom of movement, Unremarkable Speech: Logical/coherent Level of Consciousness: Alert Mood: Sad Affect: Appropriate to  circumstance Anxiety Level: None Thought Processes: Coherent, Relevant Judgement: Partial Orientation: Person, Time, Situation, Place Obsessive Compulsive Thoughts/Behaviors: None  Cognitive Functioning Concentration: Normal Memory: Recent Intact, Remote Intact IQ: Average Insight: Fair Impulse Control: Fair Appetite: Poor Sleep: Decreased Vegetative Symptoms: None  ADLScreening [facility] Assessment Services) Patient's cognitive ability adequate to safely complete daily activities?: Yes Patient able to express need for assistance with ADLs?: Yes Independently performs ADLs?: Yes (appropriate for developmental age)  Prior Inpatient Therapy Prior Inpatient Therapy: Yes Prior Therapy Dates: 2014 and other Prior Therapy Facilty/Provider(s): [place] in [place] Reason for Treatment: Depression  Prior Outpatient Therapy Prior Outpatient Therapy: Yes Prior Therapy Dates: Ongoing Prior Therapy Facilty/Provider(s): [facility] Reason for Treatment: Depression Does patient have an ACCT team?: No Does patient have Intensive In-House Services?  : No Does patient have [facility] services? : No Does patient have P4CC services?: No  ADL Screening (condition at time of admission) Patient's cognitive ability adequate to safely complete daily activities?: Yes Is the patient deaf or have difficulty hearing?: No Does the patient have difficulty seeing, even when wearing glasses/contacts?: No Does the patient have difficulty concentrating, remembering, or making decisions?: No Patient able to express need for assistance with ADLs?: Yes Does the patient have difficulty dressing or bathing?: No Independently performs ADLs?: Yes (appropriate for developmental age) Does the patient have difficulty walking or climbing stairs?: No Weakness of Legs: None Weakness of Arms/Hands: None  Home Assistive Devices/Equipment Home Assistive Devices/Equipment: None  Therapy Consults (therapy consults require a  physician order) PT Evaluation Needed: No OT Evalulation Needed:  No SLP Evaluation Needed: No Abuse/Neglect Assessment (Assessment to be complete while patient is alone) Abuse/Neglect Assessment Can Be Completed: Yes Physical Abuse: Denies Verbal Abuse: Denies Sexual Abuse: Denies Exploitation of patient/patient's resources: Denies Self-Neglect: Denies Values / Beliefs Cultural Requests During Hospitalization: None Spiritual Requests During Hospitalization: None Consults Spiritual Care Consult Needed: No Social Work Consult Needed: No Merchant navy officer (For Healthcare) Does Patient Have a Medical Advance Directive?: No Would patient like information on creating a medical advance directive?: No - Patient declined    Additional Information 1:1 In Past 12 Months?: No CIRT Risk: No Elopement Risk: No Does patient have medical clearance?: Yes     Disposition:  Disposition Initial Assessment Completed for this Encounter: Yes Disposition of Patient: Outpatient treatment Type of outpatient treatment: Adult(Per Molli Knock, NP, Pt does not meet inpt criteria)  On Site Evaluation by:   Reviewed with Physician:    Dorris Fetch Bethania Schlotzhauer 06/13/2017 9:15 AM

## 2017-06-13 NOTE — ED Triage Notes (Signed)
Patient has had a recent break up and has been Suicidal since. Patient is verbalizing SI but has no plan. Patient is paralyzed from the waist down. Patient is here voluntary. [employer] has been out to his house twice due to him voicing SI to his ex.

## 2017-06-13 NOTE — ED Notes (Signed)
Patient wanded by security. 

## 2017-06-18 ENCOUNTER — Telehealth: Payer: Self-pay | Admitting: Physical Medicine & Rehabilitation

## 2017-06-22 ENCOUNTER — Ambulatory Visit: Payer: PRIVATE HEALTH INSURANCE | Admitting: Psychology

## 2017-07-20 DIAGNOSIS — N312 Flaccid neuropathic bladder, not elsewhere classified: Secondary | ICD-10-CM | POA: Diagnosis not present

## 2017-07-26 NOTE — Telephone Encounter (Signed)
error 

## 2017-12-12 ENCOUNTER — Ambulatory Visit (INDEPENDENT_AMBULATORY_CARE_PROVIDER_SITE_OTHER): Payer: Medicare Other | Admitting: Psychology

## 2017-12-12 DIAGNOSIS — F3132 Bipolar disorder, current episode depressed, moderate: Secondary | ICD-10-CM

## 2017-12-19 ENCOUNTER — Ambulatory Visit: Payer: PRIVATE HEALTH INSURANCE | Admitting: Psychology

## 2017-12-21 ENCOUNTER — Ambulatory Visit: Payer: PRIVATE HEALTH INSURANCE | Admitting: Psychology

## 2017-12-25 ENCOUNTER — Ambulatory Visit: Payer: PRIVATE HEALTH INSURANCE | Admitting: Psychology

## 2018-01-01 ENCOUNTER — Telehealth: Payer: Self-pay | Admitting: Physical Medicine & Rehabilitation

## 2018-01-01 NOTE — Telephone Encounter (Signed)
Spoke to patient. Contact information provided. [facility] 14 catheters #20.  Dr. Allena Katz came into clinic and signed a script.  Script faxed to provided number fax#  [phone]

## 2018-01-01 NOTE — Telephone Encounter (Signed)
[phone]  Patient is in [place] and needs catheters - he states in [place] needs RX - cannot buy over the counter.  He has no MD in that state to assist - can you help him it is urgent.

## 2018-01-01 NOTE — Telephone Encounter (Signed)
I don't know how urgent it is, but we can write a prescription.  We need to know where to write it to.  This likely will not take place in a matter of hours.  If he does not have any supplies, he should go to urgent care.  Further, he needs to schedule a follow up visit.  Thanks.

## 2018-01-03 ENCOUNTER — Ambulatory Visit: Payer: PRIVATE HEALTH INSURANCE | Admitting: Psychology

## 2018-01-10 ENCOUNTER — Ambulatory Visit: Payer: PRIVATE HEALTH INSURANCE | Admitting: Psychology

## 2018-01-16 ENCOUNTER — Ambulatory Visit: Payer: Self-pay | Admitting: Psychology
